# Patient Record
Sex: Male | Born: 1950 | Race: White | Hispanic: No | Marital: Married | Smoking: Former smoker
Health system: Southern US, Community
[De-identification: ages and names within clinical notes are randomized; demographics above are authoritative.]

## PROBLEM LIST (undated history)

## (undated) DIAGNOSIS — M4800 Spinal stenosis, site unspecified: Secondary | ICD-10-CM

## (undated) DIAGNOSIS — K219 Gastro-esophageal reflux disease without esophagitis: Secondary | ICD-10-CM

## (undated) DIAGNOSIS — E785 Hyperlipidemia, unspecified: Secondary | ICD-10-CM

## (undated) DIAGNOSIS — I480 Paroxysmal atrial fibrillation: Secondary | ICD-10-CM

## (undated) DIAGNOSIS — I428 Other cardiomyopathies: Secondary | ICD-10-CM

## (undated) DIAGNOSIS — G4700 Insomnia, unspecified: Secondary | ICD-10-CM

## (undated) DIAGNOSIS — M549 Dorsalgia, unspecified: Secondary | ICD-10-CM

## (undated) DIAGNOSIS — I499 Cardiac arrhythmia, unspecified: Secondary | ICD-10-CM

## (undated) DIAGNOSIS — M1990 Unspecified osteoarthritis, unspecified site: Secondary | ICD-10-CM

## (undated) DIAGNOSIS — I4892 Unspecified atrial flutter: Secondary | ICD-10-CM

## (undated) DIAGNOSIS — J189 Pneumonia, unspecified organism: Secondary | ICD-10-CM

## (undated) DIAGNOSIS — E669 Obesity, unspecified: Secondary | ICD-10-CM

## (undated) DIAGNOSIS — M797 Fibromyalgia: Secondary | ICD-10-CM

## (undated) DIAGNOSIS — I10 Essential (primary) hypertension: Secondary | ICD-10-CM

## (undated) HISTORY — DX: Insomnia, unspecified: G47.00

## (undated) HISTORY — DX: Other cardiomyopathies: I42.8

## (undated) HISTORY — DX: Paroxysmal atrial fibrillation: I48.0

## (undated) HISTORY — DX: Dorsalgia, unspecified: M54.9

## (undated) HISTORY — PX: COLONOSCOPY: SHX174

## (undated) HISTORY — DX: Obesity, unspecified: E66.9

## (undated) HISTORY — DX: Unspecified atrial flutter: I48.92

## (undated) HISTORY — DX: Essential (primary) hypertension: I10

## (undated) HISTORY — DX: Hyperlipidemia, unspecified: E78.5

## (undated) HISTORY — PX: WISDOM TOOTH EXTRACTION: SHX21

## (undated) HISTORY — PX: NO PAST SURGERIES: SHX2092

---

## 2001-10-19 ENCOUNTER — Encounter: Payer: Self-pay | Admitting: Family Medicine

## 2001-10-19 ENCOUNTER — Encounter: Admission: RE | Admit: 2001-10-19 | Discharge: 2001-10-19 | Payer: Self-pay | Admitting: Family Medicine

## 2003-02-18 ENCOUNTER — Inpatient Hospital Stay (HOSPITAL_COMMUNITY): Admission: EM | Admit: 2003-02-18 | Discharge: 2003-02-19 | Payer: Self-pay | Admitting: Emergency Medicine

## 2003-03-01 ENCOUNTER — Ambulatory Visit (HOSPITAL_COMMUNITY): Admission: RE | Admit: 2003-03-01 | Discharge: 2003-03-01 | Payer: Self-pay | Admitting: *Deleted

## 2003-04-23 ENCOUNTER — Ambulatory Visit (HOSPITAL_COMMUNITY): Admission: RE | Admit: 2003-04-23 | Discharge: 2003-04-23 | Payer: Self-pay | Admitting: *Deleted

## 2003-07-04 ENCOUNTER — Encounter (INDEPENDENT_AMBULATORY_CARE_PROVIDER_SITE_OTHER): Payer: Self-pay | Admitting: Cardiology

## 2003-07-04 ENCOUNTER — Ambulatory Visit (HOSPITAL_COMMUNITY): Admission: RE | Admit: 2003-07-04 | Discharge: 2003-07-04 | Payer: Self-pay | Admitting: Cardiology

## 2004-07-30 ENCOUNTER — Ambulatory Visit (HOSPITAL_COMMUNITY): Admission: RE | Admit: 2004-07-30 | Discharge: 2004-07-30 | Payer: Self-pay | Admitting: Ophthalmology

## 2004-08-06 ENCOUNTER — Ambulatory Visit (HOSPITAL_COMMUNITY): Admission: RE | Admit: 2004-08-06 | Discharge: 2004-08-06 | Payer: Self-pay | Admitting: Ophthalmology

## 2005-01-25 DIAGNOSIS — I4892 Unspecified atrial flutter: Secondary | ICD-10-CM

## 2005-01-25 HISTORY — DX: Unspecified atrial flutter: I48.92

## 2005-03-30 ENCOUNTER — Emergency Department (HOSPITAL_COMMUNITY): Admission: EM | Admit: 2005-03-30 | Discharge: 2005-03-30 | Payer: Self-pay | Admitting: Emergency Medicine

## 2005-05-31 ENCOUNTER — Ambulatory Visit: Payer: Self-pay | Admitting: Internal Medicine

## 2005-06-10 ENCOUNTER — Ambulatory Visit (HOSPITAL_COMMUNITY): Admission: RE | Admit: 2005-06-10 | Discharge: 2005-06-10 | Payer: Self-pay | Admitting: Internal Medicine

## 2005-06-10 ENCOUNTER — Ambulatory Visit: Payer: Self-pay | Admitting: Internal Medicine

## 2005-06-10 HISTORY — PX: OTHER SURGICAL HISTORY: SHX169

## 2009-07-31 ENCOUNTER — Ambulatory Visit (HOSPITAL_COMMUNITY): Admission: RE | Admit: 2009-07-31 | Discharge: 2009-07-31 | Payer: Self-pay | Admitting: Interventional Cardiology

## 2010-04-02 ENCOUNTER — Ambulatory Visit (HOSPITAL_COMMUNITY)
Admission: RE | Admit: 2010-04-02 | Discharge: 2010-04-02 | Disposition: A | Discharge status: Home / Self Care | Payer: PRIVATE HEALTH INSURANCE | Source: Ambulatory Visit | Attending: Interventional Cardiology | Admitting: Interventional Cardiology

## 2010-04-02 DIAGNOSIS — I4891 Unspecified atrial fibrillation: Secondary | ICD-10-CM | POA: Insufficient documentation

## 2010-04-12 LAB — PROTIME-INR
INR: 2.63 — ABNORMAL HIGH (ref 0.00–1.49)
Prothrombin Time: 27.9 seconds — ABNORMAL HIGH (ref 11.6–15.2)

## 2010-04-20 NOTE — Op Note (Signed)
  NAME:  Gary [name], Gary [name]                ACCOUNT NO.:  [id number]  MEDICAL RECORD NO.:  [id number]           PATIENT TYPE:  O  LOCATION:  [facility]                         FACILITY:  [facility]  PHYSICIAN:  Corky Crafts, MDDATE OF BIRTH:  October 09, 1950  DATE OF PROCEDURE:  04/02/2010 DATE OF DISCHARGE:                              OPERATIVE REPORT   PRIMARY CARE PHYSICIAN:  Dr. Cam Hai.  PROCEDURE PERFORMED:  DC cardioversion.  OPERATIOR:  Corky Crafts, MD  ANESTHESIA:  Dr. Sondra Come.  PROCEDURE NOTE:  Defibrillation pads were placed on the anterior chest wall and back.  The patient received 120 joules biphasic shock which was unsuccessful restoring normal sinus rhythm.  A second 150 joules biphasic shock was administered with successful restoration of normal sinus rhythm, 200 mg of IV Diprivan was used.  There are no apparent complications.  IMPRESSION:  Successful DC cardioversion.  RECOMMENDATIONS:  Continue flecainide 100 mg b.i.d. and Coumadin indefinitely.  I will see the patient back in a few weeks.     Corky Crafts, MD     JSV/MEDQ  D:  04/02/2010  T:  04/02/2010  Job:  [id number]  Electronically Signed by Lance Muss MD on 04/20/2010 08:11:45 AM

## 2010-06-12 NOTE — Op Note (Signed)
NAME:  Gary [name], Gary [name]                ACCOUNT NO.:  [id number]   MEDICAL RECORD NO.:  [id number]          PATIENT TYPE:  OIB   LOCATION:  3701                         FACILITY:  [facility]   PHYSICIAN:  Doylene Canning. Ladona Ridgel, M.D.  DATE OF BIRTH:  [birth year]   DATE OF PROCEDURE:  06/10/2005  DATE OF DISCHARGE:                                 OPERATIVE REPORT   PROCEDURE PERFORMED AT:  Electrophysiology study and catheter ablation of  atrial flutter.   INTRODUCTION:  The patient is a 60 year old male with a history of atrial  flutter and a rapid ventricular response on multiple medications who is  admitted to hospital for electrophysiologic study and catheter ablation of  his atrial flutter.   PROCEDURE:  After informed consent was obtained, the patient was taken to  the diagnostic EP lab in the fasting state.  After usual preparation and  draping, intravenous fentanyl and midazolam was given for sedation.  A 6-  [place] hexapolar catheter was inserted percutaneously into the right jugular  vein and advanced to the coronary sinus.  A 7-French, 20-pole halo catheter  was inserted percutaneously into the right femoral vein and advanced into  the right atrium.  A 5-French quadripolar catheter was inserted  percutaneously into the right femoral vein and advanced to the His bundle  region.  After measurement of the basic intervals, mapping was carried out,  demonstrating clockwise atrial flutter utilizing the tricuspid valve annulus  and the usual atrial flutter isthmus for reentrant activation of the atrium.  The 7-French quadripolar ablation catheter was then inserted into the right  femoral vein and advanced into the right atrium.  Mapping was carried out  with the ablation catheter.  Ten RF energy applications were subsequently  delivered to the usual atrial flutter isthmus.  During the eighth RF energy  application, atrial flutter was terminated and sinus rhythm was restored.  Mapping was  subsequently carried out, demonstrating intact atrial flutter  isthmus conduction.  Two additional RF energy applications were delivered  including one bonus RF capsule energy application, resulting in the creation  of atrial flutter isthmus block (bidirectional).  At this point, rapid  ventricular pacing was carried out from the RV apex, demonstrating VA  dissociation at 600 milliseconds.  Rapid atrial pacing was carried out from  the coronary sinus and the high right atrium and stepwise decreased down to  390 milliseconds where AV Wenckebach was observed.  During rapid atrial  pacing, the PR interval was less than the R interval, and there was no  inducible SVT.  Programmed atrial stimulation was carried out from the  coronary sinus and the high right heart atrium with a base drive cycle  length of 161 milliseconds.  The S1/S2 interval was stepwise decreased down  to 320 milliseconds where AV node ERP was observed.  During programmed  atrial stimulation, there no AH jumps and no echo beats and no inducible  SVT.  At this point, the catheters were removed, hemostasis was assured and  the patient was returned to his room in satisfactory condition.   COMPLICATIONS:  There  were no immediate procedure complications.   RESULTS:  1.  Baseline ECG:  The baseline ECG demonstrates atypical atrial flutter      with controlled ventricular response.  2.  Baseline intervals:  The atrial flutter cycle length was 235      milliseconds.  The HV interval was 40 milliseconds. The QRS duration was      130 milliseconds.  3.  Rapid ventricular pacing:  Rapid atrial pacing following ablation      demonstrated VA dissociation.  4.  Programmed ventricular stimulation:  Programmed ventricular stimulation      following catheter ablation demonstrated VA dissociation at 600      milliseconds.  5.  Rapid atrial pacing:  Rapid atrial pacing was carried out from the      coronary sinus and the high right atrium.  Pacing cycle length was 500      milliseconds and stepwise decreased down to 390 milliseconds where AV      Wenckebach was observed.  During rapid atrial pacing, the PR interval      was less than the R interval, and there was no inducible SVT.  6.  Programmed atrial stimulation:  Programmed atrial stimulation was      carried out from the coronary sinus and the high right atrium at a base      drive cycle length of 951 milliseconds.  The S1/S2 interval was stepwise      decreased down to 320 milliseconds where the AV node ERP was observed.      During programmed atrial stimulation, there are no AH jumps and no echo      beats noted.  7.  Arrhythmias observed:  Atrial flutter initiation present time of EP      study.  Duration was sustained. Cycle length 235 milliseconds. Method of      termination was with catheter ablation.  8.  Mapping:  Mapping of atrial flutter isthmus demonstrated the usual size      and orientation.  Electrogram morphology was much larger than usual,      however.  9.  RF energy application:  A total of 10 RF energy applications were      delivered.  During the eighth RF energy application, atrial flutter was      terminated and sinus rhythm restored.  During the ninth RF energy      application, atrial flutter isthmus block was demonstrated.  A 10th      bonus RF energy application was subsequently delivered.  The patient was      observed 25 minutes without recurrent atrial flutter isthmus conduction.   CONCLUSION:  This study demonstrates successful electrophysiologic study and  RF catheter ablation of typical clockwise atrial flutter with a total of 10  RF energy applications delivered in the usual atrial flutter isthmus  resulting in termination of atrial flutter, restoration of sinus rhythm and  creation of bidirectional block in the atrial flutter isthmus.           ______________________________ Doylene Canning. Ladona Ridgel, M.D.     GWT/MEDQ  D:  06/10/2005  T:   06/10/2005  Job:  [id number]   cc:   Meade Maw, M.D.  Fax: [phone]   Donia Guiles, M.D.  Fax: [phone]

## 2010-06-12 NOTE — Op Note (Signed)
NAME:  Gary [name], Gary [name]                          ACCOUNT NO.:  [id number]   MEDICAL RECORD NO.:  [id number]                   PATIENT TYPE:  OIB   LOCATION:  2899                                 FACILITY:  [facility]   PHYSICIAN:  Meade Maw, M.D.                 DATE OF BIRTH:  10-16-1950   DATE OF PROCEDURE:  04/23/2003  DATE OF DISCHARGE:                                 OPERATIVE REPORT   REFERRING PHYSICIAN:  Donia Guiles, M.D.   INDICATION FOR PROCEDURE:  Atrial flutter.   PROCEDURE:  The patient has been anticoagulated for four weeks.  His INR has  been more than 2.  He is brought to the short stay unit.  Anesthesia support  was obtained from Dr. Jean Rosenthal.  The patient underwent electrical  cardioversion with biphasic cardioverter at 150 joules.  He was successfully  converted back to sinus rhythm.  He will continue with Toprol at 100 mg  daily.  He will discontinue Cardizem.  He will continue with Coumadin to  maintain an INR of 2-3.  Should he have recurrent atrial flutter, will  consider further antiarrhythmics in the future.                                               Meade Maw, M.D.    HP/MEDQ  D:  04/23/2003  T:  04/23/2003  Job:  [id number]   cc:   Donia Guiles, M.D.  [address] [place]  [place] [address]  Fax: [phone]

## 2010-06-12 NOTE — Discharge Summary (Signed)
NAME:  Gary [name], Gary [name]                ACCOUNT NO.:  [id number]   MEDICAL RECORD NO.:  [id number]          PATIENT TYPE:  OIB   LOCATION:  3701                         FACILITY:  [facility]   PHYSICIAN:  Doylene Canning. Ladona Ridgel, M.D.  DATE OF BIRTH:  [birth year]   DATE OF ADMISSION:  06/10/2005  DATE OF DISCHARGE:  06/10/2005                                 DISCHARGE SUMMARY   He has an allergy to PENICILLIN.   PRINCIPAL DIAGNOSIS:  1.  Recurrent symptomatic atrial flutter with rapid ventricular rate.      1.  Rate controlled #1 Toprol XL 200 mg daily, #2 Cardizem 480 mg daily,          and newly added Digitek.  2.  Discharging status post atrial flutter ablation, this is a clockwise      atrial flutter, by Dr. Lewayne Bunting.  3.  Decreased exercise tolerance.   SECONDARY DIAGNOSES:  1.  Hypertension.  2.  History of left heart catheterization which showed normal coronaries,      but ejection fraction 35-40%.  3.  Dyslipidemia.  4.  Obesity.   PROCEDURE:  Jun 10, 2005:  Electrophysiology study, radiofrequency catheter  ablation of clockwise atrial flutter with bilateral isthmus block.   BRIEF HISTORY:  [name] is a 60 year old male.  He has a history of  hypertension, dyslipidemia, and developed an atrial flutter about two years  ago.  He had cardioversion.  He developed recurrence several months ago and  has been on gradually increasing doses of AV nodal blocking agents.  Now he  is on Cardizem 480 mg daily and Toprol 200 mg daily.  He is referred for  additional evaluation.   The patient says he has some generalized fatigue, but is usually able to get  by with daily activities.  However, with strenuous activity he gets fatigued  and short of breath quite easily.   Dr. Ladona Ridgel has seen the patient and recommends on the basis of  electrocardiogram and symptomatic findings electrophysiology study and  radiofrequency catheter ablation of his atrial flutter.  Patient understands  the risks  and benefits and wishes to proceed electively.   HOSPITAL COURSE:  Patient presented on May 17 to [facility].  He  underwent electrophysiology study with radiofrequency catheter ablation of a  clockwise atrial flutter.  There was no return of flutter waves and the  patient has maintained sinus rhythm in the post procedure period.  He will  discharge May 17 with the medications as follows.   DISCHARGE MEDICATIONS:  1.  He is to stop Cardizem and stop Digitek.  2.  He will continue taking Coumadin 5 mg daily.  3.  Welchol 240 mg daily.  4.  Elavil 50 mg at bedtime.  5.  Lipitor 10 mg daily at bedtime.  6.  Toprol XL 200 mg daily.  7.  Lexapro 10 mg daily.   He is discharging on a low sodium, low cholesterol diet.  He is asked not to  drive for the next two days.  He is not to lift anything heavier than  10  pounds for the next two weeks.  He will see Dr. Ladona Ridgel in follow-up  Thursday, July 01, 2005 at 12:30 p.m.   LABORATORIES:  Complete blood count:  White cells 7.8, hemoglobin 15.7,  hematocrit 45.1, platelets 171.  The INR was 2.  Sodium 142, potassium 4.3,  chloride 108, carbonate 28, glucose 77, BUN 13, creatinine 1.1.      Maple Mirza, P.A.    ______________________________  Doylene Canning. Ladona Ridgel, M.D.    GM/MEDQ  D:  06/10/2005  T:  06/11/2005  Job:  [id number]   cc:   Donia Guiles, M.D.  Fax: [phone]   Meade Maw, M.D.  Fax: [phone]

## 2010-06-12 NOTE — Cardiovascular Report (Signed)
NAME:  Gary [name], Gary [name]                          ACCOUNT NO.:  [id number]   MEDICAL RECORD NO.:  [id number]                   PATIENT TYPE:  OIB   LOCATION:  2899                                 FACILITY:  [facility]   PHYSICIAN:  Meade Maw, M.D.                 DATE OF BIRTH:  03-Jan-1951   DATE OF PROCEDURE:  DATE OF DISCHARGE:  03/01/2003                              CARDIAC CATHETERIZATION   REFERRING PHYSICIAN:  Donia Guiles, M.D.   PROCEDURES PERFORMED:   CARDIOLOGIST:  Meade Maw, M.D.   INDICATIONS FOR PROCEDURE:  Cardiomyopathy.  Ejection fraction 35% by echo.   DESCRIPTION OF PROCEDURE:  After obtaining written informed consent the  patient was brought to the cardiac catheterization lab in the post  absorptive state.  Preop sedation was achieved using IV Versed.  The right  groin was prepped and draped in the usual sterile fashion.  Local anesthesia  was achieved using 1% Xylocaine.  A 6 French hemostasis sheath was placed  into the right femoral artery using the modified Seldinger technique.  Selective coronary angiography was performed using JL-4 and JR-4 Judkins  catheters.  Multiple views were obtained.  All catheter exchanges were made  over a guidewire.   Single plane ventriculogram was performed in the RAO position.  There was no  identifiable disease.   The patient was transferred to the hold area.  The hemostasis sheath was  removed.  Hemostasis was achieved using the FemoStop device.   COMPLICATIONS:  There were no immediate complications.   FINDINGS:   HEMODYNAMIC DATA:  The aortic pressure was 101/71.  LV pressure was 108/11.  The EDP was 14.   VENTRICULOGRAPHIC DATA:  Single plane ventriculogram revealed moderate  hypokinesis.  Ejection fraction 35-40%.  There was mild mitral regurgitation  noted.   ANGIOGRAPHIC DATA:  Coronary Angiography  Left Main Coronary Artery:  The left main coronary artery trifurcates into  the left anterior descending,  large ramus and circumflex vessel.  There is  no disease in the left main or its branches.   Left Anterior Descending:  The left anterior descending gives rise to a  large bifurcating D-1 and goes on to end as an apical branch.  There was no  disease noted in the left anterior descending or its branches.   Ramus Vessel:  The ramus vessel is a large vessel ending at the apex.  It  also bifurcates.  There is no disease noted in the ramus vessel.   Circumflex Vessel:  The circumflex vessel gives rise to a large trifurcating  OM-1.  There is no disease noted in the circumflex or its branches.   Right Coronary Artery:  The right coronary artery is a moderate-sized  vessel.  It gives rise to three RV marginals, a PDA and a large  posterolateral branch.  There no disease noted in the right coronary artery  or its branches.  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  Cardiomyopathy most likely related to atrial flutter with right ventricular  response.   PLAN:  1. We will continue with Cardizem and Toprol for rate control.  2. We will initiate Coumadin.  3. Following four weeks of anticoagulation electrical cardioversion will be     undertaken.  4. Should the patient develop recurrent atrial flutter we will need to     consider further antiarrhythmic therapy.                                               Meade Maw, M.D.    HP/MEDQ  D:  03/01/2003  T:  03/02/2003  Job:  [id number]

## 2010-06-26 ENCOUNTER — Ambulatory Visit (HOSPITAL_COMMUNITY)
Admission: RE | Admit: 2010-06-26 | Discharge: 2010-06-26 | Disposition: A | Discharge status: Home / Self Care | Payer: PRIVATE HEALTH INSURANCE | Source: Ambulatory Visit | Attending: Chiropractic Medicine | Admitting: Chiropractic Medicine

## 2010-06-26 ENCOUNTER — Other Ambulatory Visit (HOSPITAL_COMMUNITY): Payer: Self-pay | Admitting: Chiropractic Medicine

## 2010-06-26 DIAGNOSIS — R52 Pain, unspecified: Secondary | ICD-10-CM

## 2010-06-26 DIAGNOSIS — M5030 Other cervical disc degeneration, unspecified cervical region: Secondary | ICD-10-CM | POA: Insufficient documentation

## 2010-06-26 DIAGNOSIS — M542 Cervicalgia: Secondary | ICD-10-CM | POA: Insufficient documentation

## 2010-08-08 ENCOUNTER — Inpatient Hospital Stay (HOSPITAL_COMMUNITY)
Admission: EM | Admit: 2010-08-08 | Discharge: 2010-08-09 | DRG: 313 | Disposition: A | Discharge status: Home / Self Care | Payer: PRIVATE HEALTH INSURANCE | Source: Ambulatory Visit | Attending: Interventional Cardiology | Admitting: Interventional Cardiology

## 2010-08-08 ENCOUNTER — Emergency Department (HOSPITAL_COMMUNITY): Payer: PRIVATE HEALTH INSURANCE

## 2010-08-08 ENCOUNTER — Emergency Department (HOSPITAL_COMMUNITY)
Admission: EM | Admit: 2010-08-08 | Discharge: 2010-08-08 | Disposition: A | Discharge status: Other Acute Inpatient Hospital | Payer: PRIVATE HEALTH INSURANCE | Source: Home / Self Care | Attending: Emergency Medicine | Admitting: Emergency Medicine

## 2010-08-08 DIAGNOSIS — R0789 Other chest pain: Principal | ICD-10-CM | POA: Diagnosis present

## 2010-08-08 DIAGNOSIS — I4892 Unspecified atrial flutter: Secondary | ICD-10-CM | POA: Diagnosis present

## 2010-08-08 DIAGNOSIS — E7800 Pure hypercholesterolemia, unspecified: Secondary | ICD-10-CM | POA: Insufficient documentation

## 2010-08-08 DIAGNOSIS — R0609 Other forms of dyspnea: Secondary | ICD-10-CM | POA: Insufficient documentation

## 2010-08-08 DIAGNOSIS — R0989 Other specified symptoms and signs involving the circulatory and respiratory systems: Secondary | ICD-10-CM | POA: Insufficient documentation

## 2010-08-08 DIAGNOSIS — I4891 Unspecified atrial fibrillation: Secondary | ICD-10-CM | POA: Diagnosis present

## 2010-08-08 DIAGNOSIS — I200 Unstable angina: Secondary | ICD-10-CM | POA: Insufficient documentation

## 2010-08-08 DIAGNOSIS — E669 Obesity, unspecified: Secondary | ICD-10-CM | POA: Diagnosis present

## 2010-08-08 DIAGNOSIS — Z7982 Long term (current) use of aspirin: Secondary | ICD-10-CM

## 2010-08-08 DIAGNOSIS — R079 Chest pain, unspecified: Secondary | ICD-10-CM

## 2010-08-08 LAB — BASIC METABOLIC PANEL
CO2: 27 mEq/L (ref 19–32)
Calcium: 9.5 mg/dL (ref 8.4–10.5)
Chloride: 92 mEq/L — ABNORMAL LOW (ref 96–112)
Glucose, Bld: 108 mg/dL — ABNORMAL HIGH (ref 70–99)
Sodium: 131 mEq/L — ABNORMAL LOW (ref 135–145)

## 2010-08-08 LAB — DIFFERENTIAL
Basophils Relative: 0 % (ref 0–1)
Eosinophils Absolute: 0.1 10*3/uL (ref 0.0–0.7)
Eosinophils Relative: 2 % (ref 0–5)
Monocytes Relative: 10 % (ref 3–12)
Neutrophils Relative %: 54 % (ref 43–77)

## 2010-08-08 LAB — CBC
MCH: 32.4 pg (ref 26.0–34.0)
Platelets: 141 10*3/uL — ABNORMAL LOW (ref 150–400)
RBC: 5.06 MIL/uL (ref 4.22–5.81)
RDW: 14 % (ref 11.5–15.5)
WBC: 7.2 10*3/uL (ref 4.0–10.5)

## 2010-08-08 LAB — TROPONIN I: Troponin I: <0.3 ng/mL (ref <0.30)

## 2010-08-08 LAB — APTT: aPTT: 29 seconds (ref 24–37)

## 2010-08-08 LAB — CK TOTAL AND CKMB (NOT AT ARMC): Total CK: 436 U/L — ABNORMAL HIGH (ref 7–232)

## 2010-08-09 LAB — CARDIAC PANEL(CRET KIN+CKTOT+MB+TROPI)
CK, MB: 4.3 ng/mL — ABNORMAL HIGH (ref 0.3–4.0)
CK, MB: 4.8 ng/mL — ABNORMAL HIGH (ref 0.3–4.0)
Relative Index: 1.4 (ref 0.0–2.5)
Relative Index: 1.5 (ref 0.0–2.5)
Total CK: 355 U/L — ABNORMAL HIGH (ref 7–232)
Total CK: 306 U/L — ABNORMAL HIGH (ref 7–232)
Troponin I: <0.3 ng/mL (ref <0.30)
Troponin I: <0.3 ng/mL (ref <0.30)

## 2010-08-09 LAB — CBC
Hemoglobin: 15.6 g/dL (ref 13.0–17.0)
MCH: 33.8 pg (ref 26.0–34.0)
MCHC: 36.2 g/dL — ABNORMAL HIGH (ref 30.0–36.0)
RDW: 14.1 % (ref 11.5–15.5)

## 2010-08-09 LAB — COMPREHENSIVE METABOLIC PANEL
ALT: 52 U/L (ref 0–53)
AST: 39 U/L — ABNORMAL HIGH (ref 0–37)
Albumin: 3.6 g/dL (ref 3.5–5.2)
Alkaline Phosphatase: 83 U/L (ref 39–117)
CO2: 28 mEq/L (ref 19–32)
Chloride: 98 mEq/L (ref 96–112)
GFR calc non Af Amer: >60 mL/min (ref >60)
Potassium: 3.3 mEq/L — ABNORMAL LOW (ref 3.5–5.1)
Sodium: 136 mEq/L (ref 135–145)
Total Bilirubin: 0.3 mg/dL (ref 0.3–1.2)

## 2010-08-09 LAB — LIPID PANEL
LDL Cholesterol: 83 mg/dL (ref 0–99)
Triglycerides: 234 mg/dL — ABNORMAL HIGH (ref <150)
VLDL: 47 mg/dL — ABNORMAL HIGH (ref 0–40)

## 2010-08-09 LAB — HEMOGLOBIN A1C
Hgb A1c MFr Bld: 5.6 % (ref <5.7)
Mean Plasma Glucose: 114 mg/dL (ref <117)

## 2010-08-09 LAB — PROTIME-INR
INR: 0.95 (ref 0.00–1.49)
Prothrombin Time: 12.9 seconds (ref 11.6–15.2)

## 2010-08-19 NOTE — H&P (Signed)
NAMEMarland [name]  HUTCH, RHETT NO.:  [id number]  MEDICAL RECORD NO.:  [id number]  LOCATION:  3714                         FACILITY:  [facility]  PHYSICIAN:  Harlon Flor, MD   DATE OF BIRTH:  May 01, 1950  DATE OF ADMISSION:  08/08/2010 DATE OF DISCHARGE:                             HISTORY & PHYSICAL   CHIEF COMPLAINT:  Chest pain.  HISTORY OF PRESENT ILLNESS:  Gary [name] is a 60 year old white male with a history of atrial arrhythmias who is admitted to the hospital and transferred from [facility] due to chest pain.  He developed chest discomfort earlier tonight soon after dinner.  He went on a walk which did not relieve his chest pain, however, did not make it any worse.  His pain eventually spontaneously resolved.  It was not associated with exertion, diaphoresis, or nausea.  He did receive some nitroglycerin without any improvement in his pain.  He now states that he is pain- free.  He has a history of atrial flutter.  He reports that he has had an ablation in the past as well as multiple cardioversions.  He has not had any recent palpitations.  He did have a left heart catheterization in 2005 that did not show any obstructive disease.  PAST MEDICAL HISTORY: 1. Atrial flutter:  I do not have access to his records but he     previously had apparently an atrial flutter ablation and has had     multiple cardioversions.  Currently, he is on flecainide. 2. Nonischemic cardiomyopathy:  At the time of his left heart     catheterization in February 2005, his LVEF of 35-40% and he had no     obstructive coronary artery disease.  Hs LV dysfunction was felt to     be due to A flutter with rapid ventricular response at that time.     His repeat echo is not available currently. 3. Hyperlipidemia. 4. Significant alcohol use.  SOCIAL HISTORY:  He drinks 4-5 glasses of wine per night.  He has not used tobacco.  He lives at home with his wife and he is retired.  FAMILY  HISTORY:  There is no history of early coronary artery disease.  HOME MEDICATIONS: 1. WelChol 625 two tabs b.i.d. 2. Vitamin E 400 units daily. 3. Niaspan 1000 mg at bedtime. 4. Multivitamin 1 daily. 5. Toprol-XL 100 mg daily. 6. Melatonin as needed at bedtime. 7. Lipitor 40 mg half a tablet daily. 8. Hydrochlorothiazide 25 mg daily. 9. Flecainide 100 mg b.i.d. 10.Fish oil 1200 mg b.i.d. 11.Diltiazem 180 mg extend release b.i.d. 12.Aspirin 81 mg daily. 13.Amitriptyline 50 mg daily.  ALLERGIES:  PENICILLINS.  REVIEW OF SYSTEMS:  Full review of systems is obtained and is negative except as described in the HPI.  PHYSICAL EXAMINATION:  VITAL SIGNS:  Blood pressure 163/82, pulse 72, respirations 18, temperature 98.8, and O2 saturation 95% on room air. GENERAL:  No acute distress. HEENT:  Extraocular movements intact.  Oropharynx benign.  Nonicteric sclera. NECK:  Supple. CARDIOVASCULAR:  Regular rate and rhythm.  No murmurs, rubs, or gallops. LUNGS:  Clear to auscultation bilaterally. ABDOMEN:  Soft, nontender, and nondistended. EXTREMITIES:  There is no clubbing, cyanosis, or edema. LYMPHATIC:  No lymphadenopathy. SKIN:  No rashes. NEUROLOGIC:  He is alert and oriented x3 and moves all extremities well. Cranial nerves are grossly intact.  LABORATORY DATA:  White count 7.2, hemoglobin 16, and platelets 141. BUN 17 and creatinine 0.96.  Troponin is undetectable.  His EKG shows normal sinus rhythm with first-degree AV block and no ST-T changes.  His chest x-ray is clear.  ASSESSMENT AND PLAN:  Mr. Gary [name] is a 60 year old white male with history of atrial arrhythmias and nonischemic cardiomyopathy in the setting of atrial fibrillation with rapid ventricular response and no coronary artery disease by left heart cath in 2005 who presents with atypical chest pain and negative cardiac enzymes x1.  He reports that he had a negative stress test in March. 1. Chest pain:  We will  rule out myocardial infarction with serial     cardiac markers.  We will treat him with aspirin 325 daily as well     as Lovenox 1 mg/kg b.i.d.  We will also give him a GI cocktail to     see if this alleviate any of his symptoms.  His stress test will     need to be reviewed and may consider left heart catheterization to     rule out obstructive coronary artery disease given his recent     negative stress. 2. Atrial flutter:  He is currently in sinus rhythm.  We will continue     his flecainide for now as well as his AV nodal blockade agents.     Harlon Flor, MD     MMB/MEDQ  D:  08/08/2010  T:  08/09/2010  Job:  [id number]  cc:   Corky Crafts, MD  Electronically Signed by Meridee Score MD on 08/19/2010 08:16:09 PM

## 2010-09-03 NOTE — Discharge Summary (Signed)
  NAMEMarland Kitchen  Gary [name], Gary [name] NO.:  [id number]  MEDICAL RECORD NO.:  [id number]  LOCATION:  3714                         FACILITY:  [facility]  PHYSICIAN:  Corky Crafts, MDDATE OF BIRTH:  January 31, 1950  DATE OF ADMISSION:  08/08/2010 DATE OF DISCHARGE:  08/09/2010                              DISCHARGE SUMMARY   FINAL DIAGNOSES: 1. Atrial fibrillation. 2. Atypical chest pain. 3. Hypertension. 4. Hyperlipidemia.  HOSPITAL COURSE:  The patient had an episode of chest pressure.  It did not resolve with walking, so he came in to the hospital for further evaluation.  It resolved shortly after coming to the hospital.  He has had a fairly recent normal stress test, but he wanted to be careful.  He ruled out for MI.  Given that he felt much better, no diagnostic testing was done other than blood work.  EKG showed no significant abnormality. Chest x-ray also showed no active disease.  DISCHARGE MEDICATIONS: 1. Amitriptyline 50 mg at bedtime. 2. Aspirin 81 mg daily. 3. Diltiazem 180 mg daily. 4. Fish oil 1200 mg b.i.d. 5. Flecainide 100 mg p.o. b.i.d. 6. Hydrochlorothiazide 25 mg daily. 7. Lipitor 20 mg daily. 8. Melatonin. 9. Metoprolol XL 100 mg p.o. daily. 10.Multivitamin 1 daily. 11.Niaspan 1 g daily. 12.Vitamin E 400 units daily. 13.WelChol 625 mg tablets, 2 tablets b.i.d.  FOLLOWUP APPOINTMENTS:  With Corky Crafts, MD in 1-2 weeks. Consider repeat treadmill test at that time.  ACTIVITY:  Increase activity slowly.  DIET:  Low-sodium, heart-healthy diet.  INSTRUCTIONS:  If he has recurrent symptoms, he should come back to the emergency room.     Corky Crafts, MD     JSV/MEDQ  D:  08/28/2010  T:  08/29/2010  Job:  [id number]  Electronically Signed by Lance Muss MD on 09/03/2010 12:27:31 PM

## 2011-04-20 ENCOUNTER — Other Ambulatory Visit: Payer: Self-pay | Admitting: Family Medicine

## 2011-04-20 ENCOUNTER — Ambulatory Visit
Admission: RE | Admit: 2011-04-20 | Discharge: 2011-04-20 | Disposition: A | Discharge status: Home / Self Care | Payer: PRIVATE HEALTH INSURANCE | Source: Ambulatory Visit | Attending: Family Medicine | Admitting: Family Medicine

## 2011-04-20 DIAGNOSIS — M545 Low back pain, unspecified: Secondary | ICD-10-CM

## 2011-04-22 ENCOUNTER — Other Ambulatory Visit: Payer: Self-pay | Admitting: Sports Medicine

## 2011-04-22 DIAGNOSIS — M545 Low back pain, unspecified: Secondary | ICD-10-CM

## 2011-04-24 ENCOUNTER — Ambulatory Visit
Admission: RE | Admit: 2011-04-24 | Discharge: 2011-04-24 | Disposition: A | Discharge status: Home / Self Care | Payer: PRIVATE HEALTH INSURANCE | Source: Ambulatory Visit | Attending: Sports Medicine | Admitting: Sports Medicine

## 2011-04-24 DIAGNOSIS — M545 Low back pain, unspecified: Secondary | ICD-10-CM

## 2013-01-01 ENCOUNTER — Ambulatory Visit: Payer: PRIVATE HEALTH INSURANCE | Admitting: Interventional Cardiology

## 2013-01-06 ENCOUNTER — Encounter: Payer: Self-pay | Admitting: *Deleted

## 2013-01-06 ENCOUNTER — Encounter: Payer: Self-pay | Admitting: Interventional Cardiology

## 2013-01-06 DIAGNOSIS — E785 Hyperlipidemia, unspecified: Secondary | ICD-10-CM | POA: Insufficient documentation

## 2013-01-06 DIAGNOSIS — I4891 Unspecified atrial fibrillation: Secondary | ICD-10-CM | POA: Insufficient documentation

## 2013-01-06 DIAGNOSIS — I4892 Unspecified atrial flutter: Secondary | ICD-10-CM | POA: Insufficient documentation

## 2013-01-06 DIAGNOSIS — M549 Dorsalgia, unspecified: Secondary | ICD-10-CM | POA: Insufficient documentation

## 2013-01-06 DIAGNOSIS — I429 Cardiomyopathy, unspecified: Secondary | ICD-10-CM | POA: Insufficient documentation

## 2013-01-06 DIAGNOSIS — I519 Heart disease, unspecified: Secondary | ICD-10-CM | POA: Insufficient documentation

## 2013-01-06 DIAGNOSIS — G4700 Insomnia, unspecified: Secondary | ICD-10-CM | POA: Insufficient documentation

## 2013-01-06 DIAGNOSIS — F5104 Psychophysiologic insomnia: Secondary | ICD-10-CM | POA: Insufficient documentation

## 2013-01-06 DIAGNOSIS — E669 Obesity, unspecified: Secondary | ICD-10-CM | POA: Insufficient documentation

## 2013-01-08 ENCOUNTER — Ambulatory Visit (INDEPENDENT_AMBULATORY_CARE_PROVIDER_SITE_OTHER): Payer: BC Managed Care – PPO | Admitting: Interventional Cardiology

## 2013-01-08 ENCOUNTER — Encounter: Payer: Self-pay | Admitting: Interventional Cardiology

## 2013-01-08 VITALS — BP 125/75 | HR 74 | Ht 73.0 in | Wt 228.1 lb

## 2013-01-08 DIAGNOSIS — I4891 Unspecified atrial fibrillation: Secondary | ICD-10-CM

## 2013-01-08 DIAGNOSIS — E669 Obesity, unspecified: Secondary | ICD-10-CM

## 2013-01-08 NOTE — Progress Notes (Signed)
Patient ID: Gary [name], male   DOB: [birth year], 62 y.o.   MRN: [id number]    [address] [place], [place]  [address] Phone: [phone] Fax:  [phone]  Date:  01/08/2013   ID:  Gary [name], DOB 05-31-1950, MRN [id number]  PCP:  Lupita Raider, MD      History of Present Illness: Gary [name] is a 62 y.o. male y/o who has had AFib. He had an episode of chest pain that got him to the hospital in 2012. he had a normal treadmill test. He has done well since that time. He has cut back on his caloric intake and he has decreased alcohol intake. He exercises more now.  He is using an exercise bike a lot.  He stretches a lot as well.  He is stretching more.  Atrial Fibrillation F/U:  Denies : Chest pain.  Dizziness.  Leg edema.  Orthopnea.  Palpitations.  Shortness of breath.  Syncope.     Wt Readings from Last 3 Encounters:  01/08/13 228 lb 1.9 oz (103.475 kg)     Past Medical History  Diagnosis Date  . Obesity   . Hyperlipidemia   . A-fib   . Atrial flutter   . Insomnia   . Heart disease   . Back pain   . Cardiomyopathy     Current Outpatient Prescriptions  Medication Sig Dispense Refill  . amitriptyline (ELAVIL) 50 MG tablet Take 50 mg by mouth at bedtime.      [name] aspirin 81 MG tablet Take 81 mg by mouth daily.      [name] atorvastatin (LIPITOR) 10 MG tablet Take 10 mg by mouth daily.      . colchicine 0.6 MG tablet Take 0.6 mg by mouth daily.      . cyclobenzaprine (FLEXERIL) 10 MG tablet 10 mg as needed. 1/2 - 1 tab PRN      . diltiazem (CARDIZEM CD) 180 MG 24 hr capsule Take 180 mg by mouth daily.      . flecainide (TAMBOCOR) 100 MG tablet 100 mg 2 (two) times daily.      . metoprolol succinate (TOPROL-XL) 25 MG 24 hr tablet Take 25 mg by mouth daily.      . Multiple Vitamin (MULTIVITAMIN) capsule Take 1 capsule by mouth daily.      . Omega-3 Fatty Acids (FISH OIL PO) Take 1 tablet by mouth daily.      . valACYclovir (VALTREX) 1000 MG tablet as  needed. As directed      . VITAMIN E PO Take 1 tablet by mouth daily.       No current facility-administered medications for this visit.    Allergies:    Allergies  Allergen Reactions  . Penicillins     rash    Social History:  The patient  reports that he has quit smoking. He does not have any smokeless tobacco history on file.   Family History:  The patient's family history includes Heart attack in his father; Hypertension in his father and mother.   ROS:  Please see the history of present illness.  No nausea, vomiting.  No fevers, chills.  No focal weakness.  No dysuria.    All other systems reviewed and negative.   PHYSICAL EXAM: VS:  BP 125/75  Pulse 74  Ht 6\' 1"  (1.854 m)  Wt 228 lb 1.9 oz (103.475 kg)  BMI 30.10 kg/m2 Well nourished, well developed, in no acute distress HEENT: normal  Neck: no JVD, no carotid bruits Cardiac:  normal S1, S2; RRR;  Lungs:  clear to auscultation bilaterally, no wheezing, rhonchi or rales Abd: soft, nontender, no hepatomegaly Ext: no edema Skin: warm and dry Neuro:   no focal abnormalities noted     ASSESSMENT AND PLAN:  Atrial fibrillation  Continue Toprol XL Tablet Extended Release 24 Hour, 25 MG, 1 tablet, Orally, Once a day, 90, Refills 3 Continue Flecainide Acetate Tablet, 100 MG, 1 tablet, Orally, twice a day IMAGING: EKG    Harward,Amy 06/29/2012 09:46:22 AM > Ola Raap,JAY 06/29/2012 09:59:52 AM > NSR, no ST segment changes   Notes: Maintaining sinus rhythm.    Preventive Medicine  Adult topics discussed:  Diet: weight loss, healthy diet.  Exercise: 5 days a week, at least 30 minutes of aerobic exercise.       Signed, Fredric Mare, MD, [facility] 01/08/2013 1:26 PM

## 2013-01-08 NOTE — Patient Instructions (Signed)
Your physician wants you to follow-up in: 6 months with Dr. Varanasi. You will receive a reminder letter in the mail two months in advance. If you don't receive a letter, please call our office to schedule the follow-up appointment.  Your physician recommends that you continue on your current medications as directed. Please refer to the Current Medication list given to you today.  

## 2013-01-13 ENCOUNTER — Other Ambulatory Visit: Payer: Self-pay | Admitting: Interventional Cardiology

## 2013-05-30 ENCOUNTER — Other Ambulatory Visit: Payer: Self-pay | Admitting: *Deleted

## 2013-05-30 MED ORDER — FLECAINIDE ACETATE 100 MG PO TABS: ORAL_TABLET | ORAL | Status: DC

## 2013-06-22 ENCOUNTER — Other Ambulatory Visit: Payer: Self-pay

## 2013-06-22 MED ORDER — DILTIAZEM HCL ER COATED BEADS 180 MG PO CP24: ORAL_CAPSULE | ORAL | Status: DC

## 2013-07-20 ENCOUNTER — Other Ambulatory Visit: Payer: Self-pay

## 2013-07-20 MED ORDER — FLECAINIDE ACETATE 100 MG PO TABS: ORAL_TABLET | ORAL | Status: DC

## 2013-07-30 ENCOUNTER — Other Ambulatory Visit: Payer: Self-pay | Admitting: Interventional Cardiology

## 2013-12-17 ENCOUNTER — Other Ambulatory Visit: Payer: Self-pay | Admitting: *Deleted

## 2013-12-17 MED ORDER — DILTIAZEM HCL ER COATED BEADS 180 MG PO CP24: ORAL_CAPSULE | ORAL | Status: DC

## 2014-01-11 ENCOUNTER — Other Ambulatory Visit: Payer: Self-pay | Admitting: *Deleted

## 2014-01-11 MED ORDER — DILTIAZEM HCL ER COATED BEADS 180 MG PO CP24: ORAL_CAPSULE | ORAL | Status: DC

## 2014-01-30 ENCOUNTER — Other Ambulatory Visit: Payer: Self-pay

## 2014-01-30 MED ORDER — FLECAINIDE ACETATE 100 MG PO TABS: ORAL_TABLET | ORAL | Status: DC

## 2014-02-04 ENCOUNTER — Encounter: Payer: Self-pay | Admitting: Interventional Cardiology

## 2014-02-04 ENCOUNTER — Ambulatory Visit (INDEPENDENT_AMBULATORY_CARE_PROVIDER_SITE_OTHER): Payer: BLUE CROSS/BLUE SHIELD | Admitting: Interventional Cardiology

## 2014-02-04 VITALS — BP 122/82 | HR 67 | Ht 73.0 in | Wt 221.0 lb

## 2014-02-04 DIAGNOSIS — I4891 Unspecified atrial fibrillation: Secondary | ICD-10-CM

## 2014-02-04 NOTE — Progress Notes (Signed)
Patient ID: Gary [name], male   DOB: 1950-09-16, 64 y.o.   MRN: [id number] Patient ID: Gary [name], male   DOB: Apr 11, 1950, 64 y.o.   MRN: [id number]    [name] [place], [place]  [address] Phone: [phone] Fax:  [phone]  Date:  02/04/2014   ID:  Gary [name], DOB [birth year], MRN [id number]  PCP:  Gary Neer, MD      History of Present Illness: Gary [name] is a 64 y.o. male y/o who has had AFib. He had an episode of chest pain that got him to the hospital in 2012. he had a normal treadmill test. He has done well since that time. He has cut back on his caloric intake and he has decreased alcohol intake. He exercises more now.  He is using an exercise bike a lot.  He stretches a lot as well.  He is stretching more.  Atrial Fibrillation F/U:  Denies : Chest pain.  Dizziness.  Leg edema.  Orthopnea.  Palpitations.  Shortness of breath.  Syncope.   BP reading was elevated yesterday.  For the most part, BP has been in the 557D systolic range.  Wt Readings from Last 3 Encounters:  02/04/14 221 lb (100.245 kg)  01/08/13 228 lb 1.9 oz (103.475 kg)     Past Medical History  Diagnosis Date  . Obesity   . Hyperlipidemia   . A-fib   . Atrial flutter   . Insomnia   . Heart disease   . Back pain   . Cardiomyopathy     Current Outpatient Prescriptions  Medication Sig Dispense Refill  . amitriptyline (ELAVIL) 50 MG tablet Take 50 mg by mouth at bedtime.    [name] aspirin 81 MG tablet Take 81 mg by mouth daily.    [name] atorvastatin (LIPITOR) 10 MG tablet Take 10 mg by mouth daily.    . colchicine 0.6 MG tablet Take 0.6 mg by mouth daily.    [name] diltiazem (CARDIZEM CD) 180 MG 24 hr capsule TAKE 1 CAPSULE BY MOUTH TWICE DAILY 60 capsule 5  . flecainide (TAMBOCOR) 100 MG tablet TAKE 1 TABLET BY MOUTH TWICE DAILY 60 tablet 6  . metoprolol succinate (TOPROL-XL) 25 MG 24 hr tablet take 1 tablet by mouth once daily 90 tablet 3  . Multiple Vitamin (MULTIVITAMIN)  capsule Take 1 capsule by mouth daily.    . Omega-3 Fatty Acids (FISH OIL PO) Take 1 tablet by mouth daily.    [name] VITAMIN E PO Take 1 tablet by mouth daily.    . cyclobenzaprine (FLEXERIL) 10 MG tablet 10 mg as needed. 1/2 - 1 tab PRN    . valACYclovir (VALTREX) 1000 MG tablet as needed. As directed     No current facility-administered medications for this visit.    Allergies:    Allergies  Allergen Reactions  . Penicillins     rash    Social History:  The patient  reports that he has quit smoking. He does not have any smokeless tobacco history on file.   Family History:  The patient's family history includes Heart attack in his father; Hypertension in his father and mother.   ROS:  Please see the history of present illness.  No nausea, vomiting.  No fevers, chills.  No focal weakness.  No dysuria.    All other systems reviewed and negative.   PHYSICAL EXAM: VS:  BP 122/82 mmHg  Pulse 67  Ht 6\' 1"  (1.854 m)  Wt 221 lb (100.245 kg)  BMI 29.16 kg/m2 Well nourished, well developed, in no acute distress HEENT: normal Neck: no JVD, no carotid bruits Cardiac:  normal S1, S2; RRR;  Lungs:  clear to auscultation bilaterally, no wheezing, rhonchi or rales Abd: soft, nontender, no hepatomegaly Ext: no edema Skin: warm and dry Neuro:   no focal abnormalities noted Psych: normal affect     ASSESSMENT AND PLAN:  Atrial fibrillation  Continue Toprol XL Tablet Extended Release 24 Hour, 25 MG, 1 tablet, Orally, Once a day, 90, Refills 3 Continue Flecainide Acetate Tablet, 100 MG, 1 tablet, Orally, twice a day       Notes: Maintaining sinus rhythm.    Preventive Medicine  Adult topics discussed:  Diet: weight loss, healthy diet.  Exercise: 5 days a week, at least 30 minutes of aerobic exercise.       Signed, Gary Marble, MD, [facility] 02/04/2014 11:53 AM

## 2014-02-04 NOTE — Patient Instructions (Signed)
Your physician recommends that you continue on your current medications as directed. Please refer to the Current Medication list given to you today.  Your physician wants you to follow-up in: 1 year with Dr. Varanasi. You will receive a reminder letter in the mail two months in advance. If you don't receive a letter, please call our office to schedule the follow-up appointment.  

## 2014-02-07 ENCOUNTER — Encounter: Payer: Self-pay | Admitting: Interventional Cardiology

## 2014-02-11 ENCOUNTER — Other Ambulatory Visit: Payer: Self-pay | Admitting: Family Medicine

## 2014-02-11 DIAGNOSIS — Z8249 Family history of ischemic heart disease and other diseases of the circulatory system: Secondary | ICD-10-CM

## 2014-02-20 ENCOUNTER — Ambulatory Visit
Admission: RE | Admit: 2014-02-20 | Discharge: 2014-02-20 | Disposition: A | Discharge status: Home / Self Care | Payer: BLUE CROSS/BLUE SHIELD | Source: Ambulatory Visit | Attending: Family Medicine | Admitting: Family Medicine

## 2014-02-20 DIAGNOSIS — Z8249 Family history of ischemic heart disease and other diseases of the circulatory system: Secondary | ICD-10-CM

## 2014-06-26 ENCOUNTER — Other Ambulatory Visit: Payer: Self-pay

## 2014-06-26 MED ORDER — DILTIAZEM HCL ER COATED BEADS 180 MG PO CP24: ORAL_CAPSULE | ORAL | Status: DC

## 2014-07-26 ENCOUNTER — Other Ambulatory Visit: Payer: Self-pay

## 2014-07-26 MED ORDER — METOPROLOL SUCCINATE ER 25 MG PO TB24: 25.0000 mg | ORAL_TABLET | Freq: Every day | ORAL | Status: DC

## 2014-07-26 NOTE — Telephone Encounter (Signed)
Per note 1.11.16

## 2014-08-07 DIAGNOSIS — H2513 Age-related nuclear cataract, bilateral: Secondary | ICD-10-CM | POA: Diagnosis not present

## 2014-08-16 ENCOUNTER — Other Ambulatory Visit: Payer: Self-pay | Admitting: Interventional Cardiology

## 2014-08-16 MED ORDER — FLECAINIDE ACETATE 100 MG PO TABS: ORAL_TABLET | ORAL | Status: DC

## 2014-09-13 DIAGNOSIS — S0502XA Injury of conjunctiva and corneal abrasion without foreign body, left eye, initial encounter: Secondary | ICD-10-CM | POA: Diagnosis not present

## 2014-09-16 DIAGNOSIS — S0502XD Injury of conjunctiva and corneal abrasion without foreign body, left eye, subsequent encounter: Secondary | ICD-10-CM | POA: Diagnosis not present

## 2014-09-24 DIAGNOSIS — S0502XD Injury of conjunctiva and corneal abrasion without foreign body, left eye, subsequent encounter: Secondary | ICD-10-CM | POA: Diagnosis not present

## 2014-11-18 DIAGNOSIS — I119 Hypertensive heart disease without heart failure: Secondary | ICD-10-CM | POA: Diagnosis not present

## 2014-11-18 DIAGNOSIS — R7301 Impaired fasting glucose: Secondary | ICD-10-CM | POA: Diagnosis not present

## 2014-11-18 DIAGNOSIS — E782 Mixed hyperlipidemia: Secondary | ICD-10-CM | POA: Diagnosis not present

## 2014-11-18 DIAGNOSIS — Z6830 Body mass index (BMI) 30.0-30.9, adult: Secondary | ICD-10-CM | POA: Diagnosis not present

## 2014-11-18 DIAGNOSIS — E669 Obesity, unspecified: Secondary | ICD-10-CM | POA: Diagnosis not present

## 2014-11-18 DIAGNOSIS — Z23 Encounter for immunization: Secondary | ICD-10-CM | POA: Diagnosis not present

## 2014-11-28 DIAGNOSIS — N179 Acute kidney failure, unspecified: Secondary | ICD-10-CM | POA: Diagnosis not present

## 2014-12-12 ENCOUNTER — Other Ambulatory Visit: Payer: Self-pay | Admitting: Interventional Cardiology

## 2014-12-12 MED ORDER — DILTIAZEM HCL ER COATED BEADS 180 MG PO CP24: ORAL_CAPSULE | ORAL | Status: DC

## 2015-01-23 ENCOUNTER — Other Ambulatory Visit: Payer: Self-pay | Admitting: *Deleted

## 2015-01-23 MED ORDER — METOPROLOL SUCCINATE ER 25 MG PO TB24: 25.0000 mg | ORAL_TABLET | Freq: Every day | ORAL | Status: DC

## 2015-02-07 ENCOUNTER — Other Ambulatory Visit: Payer: Self-pay | Admitting: Interventional Cardiology

## 2015-02-24 ENCOUNTER — Other Ambulatory Visit: Payer: Self-pay | Admitting: Interventional Cardiology

## 2015-03-06 ENCOUNTER — Other Ambulatory Visit: Payer: Self-pay | Admitting: Interventional Cardiology

## 2015-03-08 ENCOUNTER — Other Ambulatory Visit: Payer: Self-pay | Admitting: Interventional Cardiology

## 2015-03-18 ENCOUNTER — Encounter: Payer: Self-pay | Admitting: Interventional Cardiology

## 2015-03-26 ENCOUNTER — Other Ambulatory Visit: Payer: Self-pay | Admitting: Interventional Cardiology

## 2015-03-26 NOTE — Telephone Encounter (Signed)
JAYTEN KISSAM  03/08/2015  Refill  MRN:  [id number]   Description: 65 year old male  Provider: Jettie Booze, MD  Department: [facility]       Call Documentation     No notes of this type exist for this encounter.     Encounter MyChart Messages     No messages in this encounter     Approved      Disp Refills Start End    diltiazem (CARDIZEM CD) 180 MG 24 hr capsule 30 capsule 0 03/10/2015     Sig:  take 1 capsule by mouth twice a day    Class:  Normal    DAW:  No    Comment:  Please call our office to schedule an yearly appointment for future refills. [phone]. Thank you 2nd attempt    Authorizing Provider:  Jettie Booze, MD    Ordering User:  [place]     [facility] [place], [place]

## 2015-03-27 ENCOUNTER — Telehealth: Payer: Self-pay | Admitting: Interventional Cardiology

## 2015-03-27 ENCOUNTER — Ambulatory Visit (INDEPENDENT_AMBULATORY_CARE_PROVIDER_SITE_OTHER): Payer: Medicare Other | Admitting: Physician Assistant

## 2015-03-27 ENCOUNTER — Encounter: Payer: Self-pay | Admitting: Physician Assistant

## 2015-03-27 VITALS — BP 115/70 | HR 60 | Ht 73.0 in | Wt 227.2 lb

## 2015-03-27 DIAGNOSIS — E785 Hyperlipidemia, unspecified: Secondary | ICD-10-CM | POA: Diagnosis not present

## 2015-03-27 DIAGNOSIS — I480 Paroxysmal atrial fibrillation: Secondary | ICD-10-CM

## 2015-03-27 DIAGNOSIS — I951 Orthostatic hypotension: Secondary | ICD-10-CM | POA: Diagnosis not present

## 2015-03-27 DIAGNOSIS — I4891 Unspecified atrial fibrillation: Secondary | ICD-10-CM | POA: Diagnosis not present

## 2015-03-27 DIAGNOSIS — I10 Essential (primary) hypertension: Secondary | ICD-10-CM

## 2015-03-27 LAB — CBC WITH DIFFERENTIAL/PLATELET
BASOS ABS: 0.1 10*3/uL (ref 0.0–0.1)
BASOS PCT: 1 % (ref 0–1)
EOS ABS: 0.1 10*3/uL (ref 0.0–0.7)
EOS PCT: 2 % (ref 0–5)
HCT: 46.5 % (ref 39.0–52.0)
Hemoglobin: 15.4 g/dL (ref 13.0–17.0)
LYMPHS PCT: 40 % (ref 12–46)
Lymphs Abs: 2.3 10*3/uL (ref 0.7–4.0)
MCH: 31.9 pg (ref 26.0–34.0)
MCHC: 33.1 g/dL (ref 30.0–36.0)
MCV: 96.3 fL (ref 78.0–100.0)
MPV: 11.6 fL (ref 8.6–12.4)
Monocytes Absolute: 0.6 10*3/uL (ref 0.1–1.0)
Monocytes Relative: 10 % (ref 3–12)
Neutro Abs: 2.7 10*3/uL (ref 1.7–7.7)
Neutrophils Relative %: 47 % (ref 43–77)
PLATELETS: 178 10*3/uL (ref 150–400)
RBC: 4.83 MIL/uL (ref 4.22–5.81)
RDW: 13.6 % (ref 11.5–15.5)
WBC: 5.7 10*3/uL (ref 4.0–10.5)

## 2015-03-27 LAB — BASIC METABOLIC PANEL
BUN: 12 mg/dL (ref 7–25)
CALCIUM: 9.6 mg/dL (ref 8.6–10.3)
CO2: 28 mmol/L (ref 20–31)
Chloride: 99 mmol/L (ref 98–110)
Creat: 1 mg/dL (ref 0.70–1.25)
Glucose, Bld: 93 mg/dL (ref 65–99)
POTASSIUM: 4.6 mmol/L (ref 3.5–5.3)
Sodium: 137 mmol/L (ref 135–146)

## 2015-03-27 LAB — TSH: TSH: 2.52 m[IU]/L (ref 0.40–4.50)

## 2015-03-27 MED ORDER — METOPROLOL SUCCINATE ER 25 MG PO TB24: 25.0000 mg | ORAL_TABLET | Freq: Every evening | ORAL | Status: DC

## 2015-03-27 MED ORDER — DILTIAZEM HCL ER COATED BEADS 180 MG PO CP24: 180.0000 mg | ORAL_CAPSULE | Freq: Every day | ORAL | Status: DC

## 2015-03-27 NOTE — Patient Instructions (Signed)
Medication Instructions:  DECREASE Cardizem CD to 180 mg once daily. CHANGE Toprol-XL to 25 mg once daily in the evening. See your medication list. Refills have been sent to your pharmacy for Toprol-XL and Cardizem CD.  Labwork: TODAY - BMET, CBC, TSH  Testing/Procedures: None   Follow-Up: Dr. Casandra Doffing in April as planned. Call for sooner follow up if your dizziness does not get better.  Any Other Special Instructions Will Be Listed Below (If Applicable). Remember to get up slowly. Drink plenty of fluids. Eat a little extra salt today (Soup, Pretzels, etc)  If you need a refill on your cardiac medications before your next appointment, please call your pharmacy.

## 2015-03-27 NOTE — Progress Notes (Signed)
Cardiology Office Note:    Date:  03/27/2015   ID:  Gary [name], DOB 08/15/1950, MRN [id number]  PCP:  Gary Neer, MD  Cardiologist:  Dr. Casandra [name]   Electrophysiologist:  Dr. Cristopher [name]   No chief complaint on file.   History of Present Illness:     Gary [name] is a 65 y.o. male with a hx of HTN, HL, recurrent atrial flutter, tachycardia mediated cardiomyopathy, atrial fibrillation controlled on flecainide. He is status post RFCA 5/07. LV function returned to normal in NSR.  Last seen by Dr. Irish [name] 1/16.   Patient called in today complaining of dizziness and falling. He is added on for further evaluation.  Since January 1, he has been exercising more and eating better. He's lost several pounds. About 1-2 weeks ago he awoke in the middle of the night to go the bathroom and noticed that he was dizzy. He describes true near syncope and fell to the ground. He did not injure himself. He has not had any frank syncope. He has had occasional episodes of feeling dizzy when he stands since that time. He denies any recent fevers, chills, cough, vomiting, diarrhea. He did take colchicine recently for gout. This is resolved. He did run out of Toprol and got it refilled yesterday. He took it last night and again this morning when he would usually take it. Today, he feels somewhat dizzy. He denies chest pain, shortness of breath, orthopnea, PND or edema. Denies palpitations.   Past Medical History  Diagnosis Date  . Obesity   . Hyperlipidemia   . A-fib ([place])   . Atrial flutter ([place])   . Insomnia   . Heart disease   . Back pain   . Cardiomyopathy ([place])     No past surgical history on file.  Current Medications: Outpatient Prescriptions Prior to Visit  Medication Sig Dispense Refill  . amitriptyline (ELAVIL) 50 MG tablet TAKE 1-2 TABLETS BY MOUTH (50- 100 MG MAX) DAILY AT BEDTIME    . aspirin 81 MG tablet Take 81 mg by mouth daily.    [name] atorvastatin (LIPITOR) 10 MG tablet  Take 10 mg by mouth daily.    . colchicine 0.6 MG tablet Take 0.6 mg by mouth daily as needed (FOR GOUT).     . flecainide (TAMBOCOR) 100 MG tablet take 1 tablet by mouth twice a day 60 tablet 3  . Multiple Vitamin (MULTIVITAMIN) capsule Take 1 capsule by mouth daily.    . valACYclovir (VALTREX) 1000 MG tablet Take 1,000 mg by mouth daily as needed (FOR COLD SORES). As directed    . diltiazem (CARDIZEM CD) 180 MG 24 hr capsule take 1 capsule by mouth twice a day 30 capsule 0  . metoprolol succinate (TOPROL-XL) 25 MG 24 hr tablet take 1 tablet by mouth once daily 30 tablet 1  . Omega-3 Fatty Acids (FISH OIL PO) Take 2 tablets by mouth daily.     [name] VITAMIN E PO Take 1 tablet by mouth daily.    . cyclobenzaprine (FLEXERIL) 10 MG tablet 10 mg as needed. Reported on 03/27/2015     No facility-administered medications prior to visit.     Allergies:   Penicillins   Social History   Social History  . Marital Status: Married    Spouse Name: N/A  . Number of Children: N/A  . Years of Education: N/A   Social History Main Topics  . Smoking status: Former Research scientist (life sciences)  . Smokeless tobacco: None  .  Alcohol Use: None  . Drug Use: None  . Sexual Activity: Not Asked   Other Topics Concern  . None   Social History Narrative     Family History:  The patient's family history includes Heart attack in his father; Hypertension in his father and mother.   ROS:   Please see the history of present illness.    Review of Systems  Neurological: Positive for dizziness.   All other systems reviewed and are negative.   Physical Exam:    VS:  BP 115/70 mmHg  Pulse 60  Ht 6\' 1"  (1.854 m)  Wt 227 lb 3.2 oz (103.057 kg)  BMI 29.98 kg/m2    Orthostatic VS for the past 24 hrs:  BP- Lying Pulse- Lying BP- Sitting Pulse- Sitting BP- Standing at 0 minutes Pulse- Standing at 0 minutes  03/27/15 1249 121/68 mmHg 62 108/67 mmHg 62 95/60 mmHg 65   GEN: Well nourished, well developed, in no acute  distress HEENT: normal Neck: no JVD, no masses Cardiac: Normal S1/S2, RRR; no murmurs,no edema;  no carotid bruits,   Respiratory:  clear to auscultation bilaterally; no wheezing, rhonchi or rales GI: soft, nontender  MS: no deformity or atrophy Skin: warm and dry  Neuro:  no focal deficits  Psych: Alert and oriented x 3, normal affect  Wt Readings from Last 3 Encounters:  03/27/15 227 lb 3.2 oz (103.057 kg)  02/04/14 221 lb (100.245 kg)  01/08/13 228 lb 1.9 oz (103.475 kg)      Studies/Labs Reviewed:     EKG:  EKG is  ordered today.  The ekg ordered today demonstrates NSR, HR 61, normal axis, QTc 390 ms, first-degree AV block, PR 252 ms  Recent Labs: No results found for requested labs within last 365 days.   Recent Lipid Panel    Component Value Date/Time   CHOL 176 08/09/2010 0036   TRIG 234* 08/09/2010 0036   HDL 46 08/09/2010 0036   CHOLHDL 3.8 08/09/2010 0036   VLDL 47* 08/09/2010 0036   LDLCALC 83 08/09/2010 0036    Additional studies/ records that were reviewed today include:   Nuc Stress Test 1/12 EF 66%, no ischemia or scar, Low Risk  Echo 6/05 EF 55-60%, mild MR  LHC 02/2003 LM ok LAD ok RI ok LCx ok RCA ok EF 35-40%   ASSESSMENT:     1. Orthostatic hypotension   2. PAF (paroxysmal atrial fibrillation) (Gary [name])   3. Essential hypertension   4. Hyperlipidemia     PLAN:     In order of problems listed above:  1. Orthostatic hypotension - Etiology not clear. I suspect that with his diet, exercise and weight loss, his blood pressure is better than it used to be. He may not need as much antihypertensive medication as previous. He denies any recent illnesses or indications of bleeding.  -  Labs today: BMET, CBC, TSH  -  Decrease Cardizem CD to 180 mg daily, change Toprol-XL to 25 mg every afternoon  -  Use caution when standing, push fluids. Follow up sooner if no improvement  2. PAF - No apparent recurrence. Continue flecainide, beta blocker,  calcium channel blocker. He has not been on anticoagulation for years. Today, in normal sinus rhythm.  3. HTN - As noted blood pressure running lower. Adjust medications as above.  4. HL - Managed by primary care. Recent LDL 82.   Continue statin.    Medication Adjustments/Labs and Tests Ordered: Current medicines are reviewed at  length with the patient today.  Concerns regarding medicines are outlined above.  Medication changes, Labs and Tests ordered today are outlined in the Patient Instructions noted below. Patient Instructions  Medication Instructions:  DECREASE Cardizem CD to 180 mg once daily. CHANGE Toprol-XL to 25 mg once daily in the evening. See your medication list. Refills have been sent to your pharmacy for Toprol-XL and Cardizem CD.  Labwork: TODAY - BMET, CBC, TSH  Testing/Procedures: None   Follow-Up: Dr. Casandra [name] in April as planned. Call for sooner follow up if your dizziness does not get better.  Any Other Special Instructions Will Be Listed Below (If Applicable). Remember to get up slowly. Drink plenty of fluids. Eat a little extra salt today (Soup, Pretzels, etc)  If you need a refill on your cardiac medications before your next appointment, please call your pharmacy.    Signed, Richardson Dopp, PA-C  03/27/2015 1:36 PM    [facility] [place], [place], [place]  [address] Phone: [phone]; Fax: [phone]

## 2015-03-27 NOTE — Telephone Encounter (Signed)
**Note De-Identified Gary [name] Obfuscation** The pt reports that he has been dizzy on and off X 1 week and that he fell in the middle of the night last night going to bathroom. He denies any injuries from fall and did not pass out. He is requesting to be seen today and is aware that Dr Irish Lack is not in the office today, he is willing to see an APP.  I discussed with Richardson Dopp, PA-c who states that he can see the pt today at 12:10. The pt is advised and he accepted appt today at 12:10 with Scott.

## 2015-03-27 NOTE — Telephone Encounter (Signed)
Please call,pt says he have been experiencing dizziness when he stand up too fast.

## 2015-05-14 DIAGNOSIS — K1379 Other lesions of oral mucosa: Secondary | ICD-10-CM | POA: Diagnosis not present

## 2015-05-15 DIAGNOSIS — K1379 Other lesions of oral mucosa: Secondary | ICD-10-CM | POA: Diagnosis not present

## 2015-05-20 DIAGNOSIS — K1379 Other lesions of oral mucosa: Secondary | ICD-10-CM | POA: Diagnosis not present

## 2015-05-21 ENCOUNTER — Encounter: Payer: Self-pay | Admitting: Interventional Cardiology

## 2015-05-21 ENCOUNTER — Ambulatory Visit (INDEPENDENT_AMBULATORY_CARE_PROVIDER_SITE_OTHER): Payer: Medicare Other | Admitting: Interventional Cardiology

## 2015-05-21 VITALS — BP 122/80 | HR 75 | Ht 73.0 in | Wt 224.2 lb

## 2015-05-21 DIAGNOSIS — E785 Hyperlipidemia, unspecified: Secondary | ICD-10-CM

## 2015-05-21 DIAGNOSIS — I4891 Unspecified atrial fibrillation: Secondary | ICD-10-CM | POA: Diagnosis not present

## 2015-05-21 DIAGNOSIS — Z8249 Family history of ischemic heart disease and other diseases of the circulatory system: Secondary | ICD-10-CM | POA: Insufficient documentation

## 2015-05-21 DIAGNOSIS — I4892 Unspecified atrial flutter: Secondary | ICD-10-CM

## 2015-05-21 NOTE — Patient Instructions (Signed)
**Note De-identified Sanii Kukla Obfuscation** Medication Instructions:  Same-no changes  Labwork: None  Testing/Procedures: None  Follow-Up: Your physician wants you to follow-up in: 1 year. You will receive a reminder letter in the mail two months in advance. If you don't receive a letter, please call our office to schedule the follow-up appointment.      

## 2015-05-21 NOTE — Progress Notes (Signed)
Patient ID: Gary [name], male   DOB: Mar 18, 1950, 65 y.o.   MRN: [id number]     Cardiology Office Note   Date:  05/21/2015   ID:  Gary [name], DOB [birth year], MRN [id number]  PCP:  Gary Neer, MD    No chief complaint on file. f/u AFib   Wt Readings from Last 3 Encounters:  05/21/15 224 lb 3.2 oz (101.696 kg)  03/27/15 227 lb 3.2 oz (103.057 kg)  02/04/14 221 lb (100.245 kg)       History of Present Illness: Gary [name] is a 65 y.o. male  who has had AFib. He had a flutter ablation in 2007.  He had some lightheadedness a few weeks ago.  His BP meds were decreased and he has felt well.  He continues to exercise.  BP has been well Controlled.  He rides an exercise bike.  He rides for 40 minutes.    No bleeding issues, SHOB, bleeding problems.  No palpitations.    He has some type of polyp in his throat that required treatment.    Past Medical History  Diagnosis Date  . Obesity   . Hyperlipidemia   . A-fib (Gary [name])   . Atrial flutter (Gary [name])   . Insomnia   . Heart disease   . Back pain   . Cardiomyopathy Gary [name])     History reviewed. No pertinent past surgical history.   Current Outpatient Prescriptions  Medication Sig Dispense Refill  . amitriptyline (ELAVIL) 50 MG tablet TAKE 1-2 TABLETS BY MOUTH (50- 100 MG MAX) DAILY AT BEDTIME    . aspirin 81 MG tablet Take 81 mg by mouth daily.    [name] atorvastatin (LIPITOR) 10 MG tablet Take 10 mg by mouth daily.    . colchicine 0.6 MG tablet Take 0.6 mg by mouth daily as needed (FOR GOUT).     [name] diltiazem (CARDIZEM CD) 180 MG 24 hr capsule Take 1 capsule (180 mg total) by mouth daily. 30 capsule 11  . flecainide (TAMBOCOR) 100 MG tablet take 1 tablet by mouth twice a day 60 tablet 3  . metoprolol succinate (TOPROL-XL) 25 MG 24 hr tablet Take 1 tablet (25 mg total) by mouth every evening. 30 tablet 11  . Multiple Vitamin (MULTIVITAMIN) capsule Take 1 capsule by mouth daily.    . Omega-3 Fatty Acids (FISH OIL) 1000 MG CAPS  TAKE 2 TABLETS BY MOUTH (2000 MG TOTAL) DAILY    . valACYclovir (VALTREX) 1000 MG tablet Take 1,000 mg by mouth daily as needed (FOR COLD SORES). As directed    . vitamin E (VITAMIN E) 400 UNIT capsule Take 400 Units by mouth daily.     No current facility-administered medications for this visit.    Allergies:   Penicillins    Social History:  The patient  reports that he has quit smoking. He does not have any smokeless tobacco history on file.   Family History:  The patient's family history includes Heart attack in his father; Hypertension in his father and mother.    ROS:  Please see the history of present illness.   Otherwise, review of systems are positive for throat pain.   All other systems are reviewed and negative.    PHYSICAL EXAM: VS:  BP 122/80 mmHg  Pulse 75  Ht 6\' 1"  (1.854 m)  Wt 224 lb 3.2 oz (101.696 kg)  BMI 29.59 kg/m2  SpO2 98% , BMI Body mass index is 29.59 kg/(m^2). GEN: Well nourished, well developed, in  no acute distress HEENT: normal Neck: no JVD, carotid bruits, or masses Cardiac: RRR; no murmurs, rubs, or gallops,no edema  Respiratory:  clear to auscultation bilaterally, normal work of breathing GI: soft, nontender, nondistended, + BS MS: no deformity or atrophy Skin: warm and dry, no rash Neuro:  Strength and sensation are intact Psych: euthymic mood, full affect     Recent Labs: 03/27/2015: BUN 12; Creat 1.00; Hemoglobin 15.4; Platelets 178; Potassium 4.6; Sodium 137; TSH 2.52   Lipid Panel    Component Value Date/Time   CHOL 176 08/09/2010 0036   TRIG 234* 08/09/2010 0036   HDL 46 08/09/2010 0036   CHOLHDL 3.8 08/09/2010 0036   VLDL 47* 08/09/2010 0036   LDLCALC 83 08/09/2010 0036     Other studies Reviewed: Additional studies/ records that were reviewed today with results demonstrating: LDL 82, HDL 35, TG 74 in 2/17.   ASSESSMENT AND PLAN:  1. AFlutter/AFib: s/p ablation for atrial flutter many years ago, Dr. Lovena Le.  He has not  had any recurrent AFib.  Heat cath was negative in the 2000s some time. Managing well on flecainide. 2. Hyperlipidemia: atorvastatin helping.  TG better. 3. Family h/o CAD: Father had MI at age 50.   71. BP stable. Continue current meds.   Current medicines are reviewed at length with the patient today.  The patient concerns regarding his medicines were addressed.  The following changes have been made:  No change  Labs/ tests ordered today include:  No orders of the defined types were placed in this encounter.    Recommend 150 minutes/week of aerobic exercise Low fat, low carb, high fiber diet recommended  Disposition:   FU in 1 year   Teresita Madura., MD  05/21/2015 1:31 PM    [facility] [place], [place], [place]  [address] Phone: [phone]; Fax: [phone]

## 2015-06-21 ENCOUNTER — Other Ambulatory Visit: Payer: Self-pay | Admitting: Interventional Cardiology

## 2015-08-15 DIAGNOSIS — H2513 Age-related nuclear cataract, bilateral: Secondary | ICD-10-CM | POA: Diagnosis not present

## 2015-10-17 DIAGNOSIS — G8929 Other chronic pain: Secondary | ICD-10-CM | POA: Diagnosis not present

## 2015-10-17 DIAGNOSIS — M79671 Pain in right foot: Secondary | ICD-10-CM | POA: Diagnosis not present

## 2015-10-17 DIAGNOSIS — L84 Corns and callosities: Secondary | ICD-10-CM | POA: Diagnosis not present

## 2015-10-28 ENCOUNTER — Ambulatory Visit (INDEPENDENT_AMBULATORY_CARE_PROVIDER_SITE_OTHER): Payer: Medicare Other | Admitting: Sports Medicine

## 2015-10-28 ENCOUNTER — Encounter: Payer: Self-pay | Admitting: Sports Medicine

## 2015-10-28 VITALS — BP 127/67 | Ht 72.0 in | Wt 220.0 lb

## 2015-10-28 DIAGNOSIS — M79672 Pain in left foot: Secondary | ICD-10-CM | POA: Diagnosis not present

## 2015-10-28 DIAGNOSIS — M79671 Pain in right foot: Secondary | ICD-10-CM

## 2015-10-28 DIAGNOSIS — M7742 Metatarsalgia, left foot: Secondary | ICD-10-CM

## 2015-10-29 NOTE — Progress Notes (Signed)
   Subjective:    Patient ID: GRALIN SHIPES, male    DOB: 1950-11-19, 65 y.o.   MRN: [id number]  HPI chief complaint: Left foot pain  65 year old male comes in today at the request of Dr.Shaw for evaluation of left foot pain. He has a history of chronic bilateral foot pain but developed acute left foot pain 3-4 weeks ago when he went for a slow walk/run. He is an avid walker but running was new for him. Shortly afterwards he began to have pain that he localizes to the second and third MTP joints on his left foot. He also developed some swelling across the dorsum of his foot. He has been taking some anti-inflammatory medicine which has helped with the swelling. He has a history of plantar fasciitis and as a result has used orthotics in the past. No numbness or tingling into his toes. No trauma that he can recall.  Past medical history reviewed Medications reviewed Allergies reviewed    Review of Systems    as above Objective:   Physical Exam  Well-developed, well-nourished. No acute distress. Vital signs reviewed  Examination of his feet in the standing position shows a fairly well-preserved longitudinal arch but a complete collapse of the transverse arch which results in callus buildup over the lateral fifth MTP joint bilaterally. He is tender to palpation over the second metatarsal head on the left foot. No tenderness to palpation along the metatarsal shaft. No pain with metatarsal squeeze. Mild diffuse soft tissue swelling across the dorsum of his foot. No palpable neuroma. Neurovascularly intact distally.  Evaluation of his gait shows him to supinate. He is not walking with a limp.      Assessment & Plan:   Left foot pain secondary to metatarsalgia versus possible metatarsal stress reaction Transverse arch collapse History of plantar fasciitis  We will construct a new orthotic today. We will take a green sports insole and add a scaphoid pad and a metatarsal pad. We will also add  a lateral post to help correct his supination. He is leaving for a trip to [place] and the [facility] next week. This trip will require a lot of walking. I am concerned that he may have an early metatarsal stress reaction, so I have decided to give him a postop shoe for him to take with him on his trip. He will use it if his foot pain is getting worse. He will follow-up with me in the office 1-2 weeks after he returns from vacation. If suspicion still exists about a possible stress reaction or stress fracture, then we will pursue diagnostic imaging. If he finds the temporary inserts to be comfortable then we could consider a custom orthotic.  Time spent with the patient was 30 minutes with greater than 50% of the time spent in face-to-face consultation discussing his diagnosis, treatment, and orthotic fitting.

## 2015-11-19 ENCOUNTER — Ambulatory Visit: Payer: Medicare Other | Admitting: Sports Medicine

## 2015-11-27 ENCOUNTER — Encounter: Payer: Self-pay | Admitting: Interventional Cardiology

## 2015-11-27 DIAGNOSIS — K640 First degree hemorrhoids: Secondary | ICD-10-CM | POA: Diagnosis not present

## 2015-11-27 DIAGNOSIS — Z1211 Encounter for screening for malignant neoplasm of colon: Secondary | ICD-10-CM | POA: Diagnosis not present

## 2015-11-28 ENCOUNTER — Ambulatory Visit: Payer: Medicare Other | Admitting: Sports Medicine

## 2015-11-28 ENCOUNTER — Encounter: Payer: Self-pay | Admitting: Sports Medicine

## 2015-11-28 ENCOUNTER — Ambulatory Visit: Payer: Self-pay

## 2015-11-28 ENCOUNTER — Ambulatory Visit (INDEPENDENT_AMBULATORY_CARE_PROVIDER_SITE_OTHER): Payer: Medicare Other | Admitting: Sports Medicine

## 2015-11-28 VITALS — BP 134/72

## 2015-11-28 DIAGNOSIS — M79672 Pain in left foot: Secondary | ICD-10-CM

## 2015-11-28 DIAGNOSIS — M84375A Stress fracture, left foot, initial encounter for fracture: Secondary | ICD-10-CM

## 2015-11-28 NOTE — Progress Notes (Signed)
   Subjective:    Patient ID: Gary [name], male    DOB: 1950/05/21, 65 y.o.   MRN: [id number]  HPI   Patient comes in today for follow-up on left foot pain. Overall, his pain has not improved. He was able to enjoy his trip to [place] and the [facility] recently. He did quite a bit of walking and, although his foot was sore, he did not feel the need for his postop shoe. During that trip he did notice persistent swelling on the dorsum of his foot and some mild erythema. Since returning home his swelling has improved but his pain is essentially the same as it was when I saw him one month ago. He localizes his pain to the distal second metatarsal. He does find the orthotics to be comfortable.    Review of Systems     Objective:   Physical Exam Well-developed, well-nourished. No acute distress  Left foot: There is mild soft tissue swelling dorsally on the foot. There is palpable callus along the distal second metatarsal shaft. Patient is tender to palpation in this area as well. No erythema. Neurovascularly intact distally. Walking with a slight limp.  MSK ultrasound of the left foot was performed. Limited images of the distal second metatarsal were obtained. There is an area of cortical breakthrough and surrounding fluid at the distal second metatarsal just proximal to the MTP joint. This is seen on both long and short view. The short view also shows evidence of soft callus formation. Findings are consistent with a healing distal second metatarsal stress fracture.       Assessment & Plan:   Distal second metatarsal stress fracture, left foot  Since the patient has persistent pain and swelling we will immobilize his foot in a postop shoe for the next 3 weeks. He may continue to ride his exercise bike but will refrain from recreational walking. Follow-up with me in 3 weeks for reevaluation and repeat ultrasound. Once his stress fracture has healed, we will need to construct him some  custom orthotics. We could consider this at his next visit. His current green sports insoles have scaphoid pads, metatarsal pads, and lateral posts.

## 2015-12-22 ENCOUNTER — Encounter: Payer: Self-pay | Admitting: Sports Medicine

## 2015-12-22 ENCOUNTER — Ambulatory Visit (INDEPENDENT_AMBULATORY_CARE_PROVIDER_SITE_OTHER): Payer: Medicare Other | Admitting: Sports Medicine

## 2015-12-22 VITALS — BP 122/66 | Ht 72.0 in | Wt 220.0 lb

## 2015-12-22 DIAGNOSIS — M84375D Stress fracture, left foot, subsequent encounter for fracture with routine healing: Secondary | ICD-10-CM | POA: Diagnosis present

## 2015-12-22 NOTE — Progress Notes (Addendum)
   Subjective:    Patient ID: Gary [name], male    DOB: 1950/12/22, 65 y.o.   MRN: [id number]  HPI   Patient comes in today for follow-up on his left foot distal second metatarsal stress fracture. Overall, he feels about 50-60% better. He has been wearing his postop shoe. His swelling has improved. He does state that he still gets pain with plantar flexion or dorsiflexion of the second toe. He continues to localize his pain to the distal second metatarsal. He has been able to ride an exercise bike without any pain.     Review of Systems     Objective:   Physical Exam  Well-developed, well-nourished. No acute distress  Left foot: There is palpable callus over the distal second metatarsal. He does still have some tenderness to palpation here. Previous swelling across the dorsum of his foot has resolved. Neurovascularly intact distally. Walking without a noticeable limp.  MSK ultrasound of the left foot was performed. Limited images of the distal second metatarsal were obtained. There is still an area of cortical breakthrough at the distal second metatarsal but there appears to be good callus formation surrounding the stress fracture. Callus is best seen on the short axis view. Findings are consistent with a healing distal second metatarsal stress fracture.       Assessment & Plan:   Distal second metatarsal stress fracture, left foot  Patient still lacks ultrasound evidence of complete fracture healing but clinically he is doing very well. I think he needs to remain in his postop shoe for another 3 weeks but he can start to remove it more when walking around the house as long as he is not experiencing any pain. He will return to the office in 3 weeks and we will repeat his ultrasound at that time. I still think that he may benefit from custom orthotics at some point down the road.

## 2016-01-12 ENCOUNTER — Ambulatory Visit: Payer: Medicare Other

## 2016-01-12 ENCOUNTER — Encounter: Payer: Self-pay | Admitting: Sports Medicine

## 2016-01-12 ENCOUNTER — Ambulatory Visit (INDEPENDENT_AMBULATORY_CARE_PROVIDER_SITE_OTHER): Payer: Medicare Other | Admitting: Sports Medicine

## 2016-01-12 ENCOUNTER — Ambulatory Visit
Admission: RE | Admit: 2016-01-12 | Discharge: 2016-01-12 | Disposition: A | Discharge status: Home / Self Care | Payer: Medicare Other | Source: Ambulatory Visit | Attending: Sports Medicine | Admitting: Sports Medicine

## 2016-01-12 VITALS — BP 140/74 | Ht 72.0 in | Wt 220.0 lb

## 2016-01-12 DIAGNOSIS — M79672 Pain in left foot: Secondary | ICD-10-CM

## 2016-01-12 NOTE — Progress Notes (Signed)
   Subjective:    Patient ID: Gary [name], male    DOB: March 24, 1950, 65 y.o.   MRN: [id number]  HPI   Patient comes in today for follow-up on left second metatarsal stress fracture. He is still complaining of pain but it is primarily on the plantar aspect of his foot. He has been wearing his postop shoe. In fact, he is able to ambulate with little to no pain. His pain is intermittent. Sometimes occurs at night. He has not noticed any swelling.    Review of Systems As above    Objective:   Physical Exam  Well-developed, well-nourished. No acute distress  Left foot: There is no tenderness to palpation along the distal second metatarsal. No swelling. No pain with metatarsal squeeze. He is tender to palpation at the metatarsal head on the plantar aspect of the foot. Neurovascularly intact distally.  MSK ultrasound of the left foot with attention to the distal left second metatarsal shows good callus formation over the previous stress fracture.  X-rays of the left foot done today demonstrate only a very faint lucency at the very distal second metatarsal at the metatarsal head. Otherwise unremarkable.      Assessment & Plan:   Left foot pain with ultrasound evidence of a well healing stress fracture  Although the x-ray does not show significant callus, this is definitely seen on his ultrasound. I think he can wean from his postop shoe and resume wearing his green sports insoles with metatarsal pads. I think some of his residual discomfort is from metatarsalgia and I'm hopeful that the metatarsal pads will help. I asked the patient to follow-up with me in 4 weeks either here in the office or via telephone for check on his progress. I think he can start to increase activity as tolerated. We also discussed the possibility of custom orthotics at a later date if he finds the temporary green inserts to be helpful.

## 2016-02-19 ENCOUNTER — Encounter: Payer: Self-pay | Admitting: Sports Medicine

## 2016-02-19 ENCOUNTER — Ambulatory Visit (INDEPENDENT_AMBULATORY_CARE_PROVIDER_SITE_OTHER): Payer: PPO | Admitting: Sports Medicine

## 2016-02-19 VITALS — BP 132/73 | HR 70 | Ht 73.0 in | Wt 230.0 lb

## 2016-02-19 DIAGNOSIS — M79672 Pain in left foot: Secondary | ICD-10-CM

## 2016-02-19 NOTE — Progress Notes (Signed)
    Subjective:  Gary [name] is a 66 y.o. male who presents to the [facility] today with a chief complaint of left foot [name].   HPI: Left Foot [name] Patient with a history of a left second metatarsal stress fracture. At his appointment about 4 weeks ago he started weaning off the postop show and started wearing green sports insoles with metatarsal pads. Since then, [name] has not improved. He is still having significant [name] with walking. He also has [name] at night while sleeping when the sheets lay on his foot. [name] is primarily along the second metatarsal head with radiating [name] into his second toe. He has reproducible [name] with any sort of passive or active toe motion.  Right Hip [name] Patient also complains of right hip [name] that started a few days ago while riding his stationary bike. He has been riding about 45 minutes to an hour every day. It is much improved today.  Right Shoulder [name] Patient also complains of right shoulder [name] while working out. Specifically while doing bench presses and flys. No current [name]. [name] started 2 days ago.   ROS: Per HPI  PMH: Smoking history reviewed.    Objective:  Physical Exam: BP 132/73   Pulse 70   Ht 6\' 1"  (1.854 m)   Wt 230 lb (104.3 kg)   BMI 30.34 kg/m   Gen: NAD, resting comfortably MSK: - Left Foot: Tender to palpation along head of distal second metatarsal. No deformities. Neurovascularly intact. -Right Hip: No deformities. Tender just super and posterior to greater troachanter.  - Right Shoulder: No deformities. Tender over AC joint. Negative empty can. Normal strength in all fields.  Assessment/Plan:  Second Left Metatarsal Stress Fracture- R/O neuroma / intermetatarsal bursitis Concerning that he has had persistent symptoms. Will check MRI to rule out other pathologies including neuromas or degenerative changes.   Right Hip [name] Likely gluteus strain secondary to overuse. Recommended patient to cut back on distances and only ride  every other day until [name] improves.  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 No signs of rotator cuff pathology. Modify load bearing exercises on the shoulder.   Gary [name]. Gary [name], [name] Resident PGY-3 02/19/2016 12:43 PM   Patient seen and evaluated with the resident. I agree with the above plan of care. Patient's left foot [name] has been present now for several months. His initial stress fracture is well-healed. X-rays done at his last office visit showed no evidence of persistent fracture. 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 He may also be experiencing symptoms secondary to a neuroma, intermetatarsal bursitis, or degenerative changes at the second MTP joint. Therefore, I need to proceed with an MRI scan before recommending further treatment. We will make him a new pair of temporary orthotics as he does think that his old orthotics have been helpful but they are wearing out. Treatment of his right hip [name] and right shoulder [name] are as above.

## 2016-02-24 ENCOUNTER — Other Ambulatory Visit: Payer: Self-pay | Admitting: Sports Medicine

## 2016-02-26 ENCOUNTER — Inpatient Hospital Stay: Admission: RE | Admit: 2016-02-26 | Payer: Medicare Other | Source: Ambulatory Visit

## 2016-02-29 ENCOUNTER — Ambulatory Visit
Admission: RE | Admit: 2016-02-29 | Discharge: 2016-02-29 | Disposition: A | Discharge status: Home / Self Care | Payer: Medicare Other | Source: Ambulatory Visit | Attending: Sports Medicine | Admitting: Sports Medicine

## 2016-02-29 DIAGNOSIS — M79672 Pain in left foot: Secondary | ICD-10-CM | POA: Diagnosis not present

## 2016-03-01 ENCOUNTER — Telehealth: Payer: Self-pay | Admitting: Sports Medicine

## 2016-03-01 NOTE — Telephone Encounter (Signed)
I spoke with Gary [name] on the phone today after reviewing the MRI of his left foot. 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 This was suspected on x-ray as well. Remainder of his foot is unremarkable. His prior stress fracture is no longer evident. This is an unusual finding in someone of his age. I recommended consultation with Dr. Doran Durand at [facility] orthopedics to discuss further management. Patient is in agreement with this plan. I'll defer further workup and treatment to the discretion of Dr. Doran Durand. Patient will follow-up with me as needed.

## 2016-03-17 ENCOUNTER — Other Ambulatory Visit: Payer: Self-pay | Admitting: Interventional Cardiology

## 2016-04-06 ENCOUNTER — Other Ambulatory Visit: Payer: Self-pay | Admitting: Physician Assistant

## 2016-04-06 DIAGNOSIS — I480 Paroxysmal atrial fibrillation: Secondary | ICD-10-CM

## 2016-04-13 ENCOUNTER — Other Ambulatory Visit: Payer: Self-pay | Admitting: Physician Assistant

## 2016-04-13 DIAGNOSIS — I480 Paroxysmal atrial fibrillation: Secondary | ICD-10-CM

## 2016-04-19 DIAGNOSIS — M79672 Pain in left foot: Secondary | ICD-10-CM | POA: Diagnosis not present

## 2016-04-19 DIAGNOSIS — M9272 Juvenile osteochondrosis of metatarsus, left foot: Secondary | ICD-10-CM | POA: Diagnosis not present

## 2016-04-19 DIAGNOSIS — G8929 Other chronic pain: Secondary | ICD-10-CM | POA: Diagnosis not present

## 2016-04-22 ENCOUNTER — Telehealth: Payer: Self-pay

## 2016-04-22 NOTE — Telephone Encounter (Signed)
No further cardiac testing needed before surgery.

## 2016-04-22 NOTE — Telephone Encounter (Signed)
Clearance faxed to [employer] at [facility] at [phone]. Confirmation was received.

## 2016-04-22 NOTE — Telephone Encounter (Signed)
Request for surgical clearance:  1. What type of surgery is being performed? Left II Toe: Left 2nd MT Weil osteotomy and rotational osteotomy; 5th MT bunionette excision   2. When is this surgery scheduled? TBD   3. Are there any medications that need to be held prior to surgery and how long?   4. Name of physician performing surgery? Wylene Simmer, MD   5. What is your office phone and fax number? Phone- [phone], Fax- [phone]

## 2016-05-18 ENCOUNTER — Other Ambulatory Visit: Payer: Self-pay | Admitting: Interventional Cardiology

## 2016-05-18 ENCOUNTER — Other Ambulatory Visit: Payer: Self-pay | Admitting: *Deleted

## 2016-05-18 DIAGNOSIS — I480 Paroxysmal atrial fibrillation: Secondary | ICD-10-CM

## 2016-05-18 MED ORDER — DILTIAZEM HCL ER COATED BEADS 180 MG PO CP24: 180.0000 mg | ORAL_CAPSULE | Freq: Every day | ORAL | 0 refills | Status: DC

## 2016-05-23 NOTE — Progress Notes (Signed)
Patient ID: Gary [name], male   DOB: [birth year], 66 y.o.   MRN: [id number]     Cardiology Office Note   Date:  05/24/2016   ID:  Tregan, Read 1950-02-20, MRN [id number]  PCP:  [name], MD    Chief Complaint  Patient presents with  . Follow-up    PER RECALL IN EPIC  f/u AFib   Wt Readings from Last 3 Encounters:  05/24/16 236 lb (107 kg)  02/19/16 230 lb (104.3 kg)  01/12/16 220 lb (99.8 kg)       History of Present Illness: Gary [name] is a 66 y.o. male  who has had AFib. He had a flutter ablation in 2007.  He had some lightheadedness a few weeks ago.  His BP meds were decreased and he has felt well.    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  He stopped exercise for a while and has been eating more.  He gained weight.  He has had a stress fracture in his foot that has limited him.  He is resuming exercise.  BP has been well Controlled.  He rides an exercise bike.  He rides for 30-40 minutes. With stress, he is drinking more.     No bleeding issues, SHOB, bleeding problems.  No palpitations.    He has some type of polyp in his throat that required treatment.  No f/u issues from the polyp he had in his throat.    Past Medical History:  Diagnosis Date  . A-fib ([place])   . Atrial flutter ([place])   . Back pain   . Cardiomyopathy ([place])   . Heart disease   . Hyperlipidemia   . Insomnia   . Obesity     No past surgical history on file.   Current Outpatient Prescriptions  Medication Sig Dispense Refill  . amitriptyline (ELAVIL) 50 MG tablet TAKE 1-2 TABLETS BY MOUTH (50- 100 MG MAX) DAILY AT BEDTIME    . aspirin 81 MG tablet Take 81 mg by mouth daily.    [name] atorvastatin (LIPITOR) 10 MG tablet Take 10 mg by mouth daily.    . colchicine 0.6 MG tablet Take 0.6 mg by mouth daily as needed (FOR GOUT).     [name] diltiazem (CARTIA XT) 180 MG 24 hr capsule Take 1 capsule (180 mg total) by mouth daily. Please contact  office to schedule follow up [phone] 15 capsule 0  . flecainide (TAMBOCOR) 100 MG tablet take 1 tablet by mouth twice a day 60 tablet 9  . metoprolol succinate (TOPROL-XL) 25 MG 24 hr tablet Take 1 tablet (25 mg total) by mouth every evening. *Please call and schedule a one year follow up appointment* 30 tablet 0  . Multiple Vitamin (MULTIVITAMIN) capsule Take 1 capsule by mouth daily.    . Omega-3 Fatty Acids (FISH OIL) 1200 MG CAPS Take 1,200 mg by mouth 2 (two) times daily.     . valACYclovir (VALTREX) 1000 MG tablet Take 1,000 mg by mouth daily as needed (FOR COLD SORES). As directed    . vitamin E (VITAMIN E) 400 UNIT capsule Take 400 Units by mouth daily.     No current facility-administered medications for this visit.     Allergies:   Penicillins    Social History:  The patient  reports that he has quit smoking. He has never used smokeless tobacco.   Family History:  The patient's family history includes Heart  attack in his father; Hypertension in his father and mother.    ROS:  Please see the history of present illness.   Otherwise, review of systems are positive for throat pain.   All other systems are reviewed and negative.    PHYSICAL EXAM: VS:  BP 124/82   Pulse 65   Ht 6\' 1"  (1.854 m)   Wt 236 lb (107 kg)   SpO2 97%   BMI 31.14 kg/m  , BMI Body mass index is 31.14 kg/m. GEN: Well nourished, well developed, in no acute distress  HEENT: normal  Neck: no JVD, carotid bruits, or masses Cardiac: RRR; no murmurs, rubs, or gallops,no edema  Respiratory:  clear to auscultation bilaterally, normal work of breathing GI: soft, nontender, nondistended, + BS MS: no deformity or atrophy  Skin: warm and dry, no rash Neuro:  Strength and sensation are intact Psych: euthymic mood, full affect     Recent Labs: No results found for requested labs within last 8760 hours.   Lipid Panel    Component Value Date/Time   CHOL 176 08/09/2010 0036   TRIG 234 (H) 08/09/2010  0036   HDL 46 08/09/2010 0036   CHOLHDL 3.8 08/09/2010 0036   VLDL 47 (H) 08/09/2010 0036   LDLCALC 83 08/09/2010 0036     Other studies Reviewed: Additional studies/ records that were reviewed today with results demonstrating: LDL 82, HDL 35, TG 74 in 2/17.   ASSESSMENT AND PLAN:  1. AFlutter/AFib: s/p ablation for atrial flutter many years ago, Dr. Lovena Le.  He has not had any recurrent AFib.  Heat cath was negative in the 2000s some time. Managing well on flecainide.  No side effects or breakthrough.  Minimize alcohol use as well as this can be a trigger for AFib.   2. Hyperlipidemia: atorvastatin helping.  TG better.  Followed by Dr. Brigitte Pulse.  Checked in late 2017. 3. Family h/o CAD: Father had MI at age 26.   63. BP stable. Continue current meds.  He has gained weight since the last visit.  He will try to return to his regular diet.  He will decrease alcohol as well to decrease carb intake.   5. Obesity: spoke about weight loss.   Current medicines are reviewed at length with the patient today.  The patient concerns regarding his medicines were addressed.  The following changes have been made:  No change  Labs/ tests ordered today include:  No orders of the defined types were placed in this encounter.   Recommend 150 minutes/week of aerobic exercise Low fat, low carb, high fiber diet recommended  Disposition:   FU in 1 year   Signed, Larae Grooms, MD  05/24/2016 12:19 PM    [facility] [address], [place], [place]  [id number] Phone: [phone]; Fax: [phone]

## 2016-05-24 ENCOUNTER — Encounter: Payer: Self-pay | Admitting: Interventional Cardiology

## 2016-05-24 ENCOUNTER — Ambulatory Visit (INDEPENDENT_AMBULATORY_CARE_PROVIDER_SITE_OTHER): Payer: PPO | Admitting: Interventional Cardiology

## 2016-05-24 ENCOUNTER — Encounter (INDEPENDENT_AMBULATORY_CARE_PROVIDER_SITE_OTHER): Payer: Self-pay

## 2016-05-24 VITALS — BP 124/82 | HR 65 | Ht 73.0 in | Wt 236.0 lb

## 2016-05-24 DIAGNOSIS — E669 Obesity, unspecified: Secondary | ICD-10-CM | POA: Diagnosis not present

## 2016-05-24 DIAGNOSIS — I4892 Unspecified atrial flutter: Secondary | ICD-10-CM

## 2016-05-24 DIAGNOSIS — I480 Paroxysmal atrial fibrillation: Secondary | ICD-10-CM

## 2016-05-24 DIAGNOSIS — E782 Mixed hyperlipidemia: Secondary | ICD-10-CM | POA: Diagnosis not present

## 2016-05-24 DIAGNOSIS — Z8249 Family history of ischemic heart disease and other diseases of the circulatory system: Secondary | ICD-10-CM

## 2016-05-24 MED ORDER — DILTIAZEM HCL ER COATED BEADS 180 MG PO CP24: 180.0000 mg | ORAL_CAPSULE | Freq: Every day | ORAL | 3 refills | Status: DC

## 2016-05-24 NOTE — Patient Instructions (Signed)
Medication Instructions:  Your physician recommends that you continue on your current medications as directed. Please refer to the Current Medication list given to you today.   A REFILL WAS SENT IN FOR THE DILTIAZEM  Labwork: NONE ORDERED   Testing/Procedures: NONE ORDERED  Follow-Up: Your physician wants you to follow-up in: [place] DR. VARANASI  You will receive a reminder letter in the mail two months in advance. If you don't receive a letter, please call our office to schedule the follow-up appointment.   Any Other Special Instructions Will Be Listed Below (If Applicable).     If you need a refill on your cardiac medications before your next appointment, please call your pharmacy.

## 2016-06-02 ENCOUNTER — Other Ambulatory Visit: Payer: Self-pay | Admitting: Physician Assistant

## 2016-06-02 DIAGNOSIS — I480 Paroxysmal atrial fibrillation: Secondary | ICD-10-CM

## 2016-06-17 ENCOUNTER — Ambulatory Visit (INDEPENDENT_AMBULATORY_CARE_PROVIDER_SITE_OTHER): Payer: PPO | Admitting: Interventional Cardiology

## 2016-06-17 ENCOUNTER — Encounter (INDEPENDENT_AMBULATORY_CARE_PROVIDER_SITE_OTHER): Payer: Self-pay

## 2016-06-17 ENCOUNTER — Encounter: Payer: Self-pay | Admitting: Interventional Cardiology

## 2016-06-17 ENCOUNTER — Telehealth: Payer: Self-pay | Admitting: Interventional Cardiology

## 2016-06-17 VITALS — BP 118/90 | HR 91 | Ht 73.0 in | Wt 230.8 lb

## 2016-06-17 DIAGNOSIS — I4891 Unspecified atrial fibrillation: Secondary | ICD-10-CM | POA: Diagnosis not present

## 2016-06-17 DIAGNOSIS — Z8249 Family history of ischemic heart disease and other diseases of the circulatory system: Secondary | ICD-10-CM

## 2016-06-17 MED ORDER — APIXABAN 5 MG PO TABS: 5.0000 mg | ORAL_TABLET | Freq: Two times a day (BID) | ORAL | 3 refills | Status: DC

## 2016-06-17 NOTE — Telephone Encounter (Signed)
New Message  Pt voiced wanting to speak with nurse and for nurse to return call

## 2016-06-17 NOTE — Patient Instructions (Addendum)
Medication Instructions:  Your physician has recommended you make the following change in your medication:   1- START Eliquis 5 mg twice a day  Labwork: CBC, BMET, PT/INR  Testing/Procedures: Your physician has requested that you have a TEE on 06/23/16. During a TEE, sound waves are used to create images of your heart. It provides your doctor with information about the size and shape of your heart and how well your heart's chambers and valves are working. In this test, a transducer is attached to the end of a flexible tube that's guided down your throat and into your esophagus (the tube leading from you mouth to your stomach) to get a more detailed image of your heart. You are not awake for the procedure. Please see the instruction sheet given to you today. For further information please visit [email or web address].   Your physician has recommended that you have a Cardioversion (DCCV) on 06/23/16. Electrical Cardioversion uses a jolt of electricity to your heart either through paddles or wired patches attached to your chest. This is a controlled, usually prescheduled, procedure. Defibrillation is done under light anesthesia in the hospital, and you usually go home the day of the procedure. This is done to get your heart back into a normal rhythm. You are not awake for the procedure. Please see the instruction sheet given to you today.    Follow-Up: Your physician recommends that you schedule a follow-up appointment after TEE/Cardioversion with [name] for management of Atrial Fibrillation   Any Other Special Instructions Will Be Listed Below (If Applicable).     If you need a refill on your cardiac medications before your next appointment, please call your pharmacy.

## 2016-06-17 NOTE — Progress Notes (Addendum)
Patient ID: Gary [name], male   DOB: 03-15-1950, 66 y.o.   MRN: [id number]     Cardiology Office Note   Date:  06/17/2016   ID:  Gary, [name] 01/15/1951, MRN [id number]  PCP:  Gary Neer, MD    No chief complaint on file. f/u AFib   Wt Readings from Last 3 Encounters:  06/17/16 230 lb 12.8 oz (104.7 kg)  05/24/16 236 lb (107 kg)  02/19/16 230 lb (104.3 kg)       History of Present Illness: Gary [name] is a 66 y.o. male  who has had AFib. He had a flutter ablation in 2007.    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  He stopped exercise for a while and has been eating more.  He gained weight.  He has had a stress fracture in his foot that has limited him.  He is resuming exercise.  BP has been well Controlled.  He rides an exercise bike.  He rides for 30-40 minutes. With stress, he is drinking more.   The family stress has persisted.  No bleeding issues, SHOB.   He has some type of polyp in his throat that required treatment.  No f/u issues from the polyp he had in his throat.  Since the last visit, he has had palpitations.  He has been more tired.  Denies :  Dizziness. Leg edema. Nitroglycerin use. Orthopnea.  Paroxysmal nocturnal dyspnea. Shortness of breath. Syncope.   He can function, but just does not feel right.     Past Medical History:  Diagnosis Date  . A-fib ([place])   . Atrial flutter ([place])   . Back pain   . Cardiomyopathy ([place])   . Heart disease   . Hyperlipidemia   . Insomnia   . Obesity     No past surgical history on file.   Current Outpatient Prescriptions  Medication Sig Dispense Refill  . amitriptyline (ELAVIL) 50 MG tablet TAKE 1-2 TABLETS BY MOUTH (50- 100 MG MAX) DAILY AT BEDTIME    . aspirin 81 MG tablet Take 81 mg by mouth daily.    [name] atorvastatin (LIPITOR) 10 MG tablet Take 10 mg by mouth daily.    . colchicine 0.6 MG tablet Take 0.6 mg by mouth daily as needed (FOR GOUT).       [name] diltiazem (CARTIA XT) 180 MG 24 hr capsule Take 1 capsule (180 mg total) by mouth daily. 90 capsule 3  . flecainide (TAMBOCOR) 100 MG tablet take 1 tablet by mouth twice a day 60 tablet 9  . metoprolol succinate (TOPROL-XL) 25 MG 24 hr tablet Take 1 tablet (25 mg total) by mouth daily. 30 tablet 11  . Multiple Vitamin (MULTIVITAMIN) capsule Take 1 capsule by mouth daily.    . Omega-3 Fatty Acids (FISH OIL) 1200 MG CAPS Take 1,200 mg by mouth 2 (two) times daily.     . valACYclovir (VALTREX) 1000 MG tablet Take 1,000 mg by mouth daily as needed (FOR COLD SORES). As directed    . vitamin E (VITAMIN E) 400 UNIT capsule Take 400 Units by mouth daily.     No current facility-administered medications for this visit.     Allergies:   Penicillins    Social History:  The patient  reports that he has quit smoking. He has never used smokeless tobacco.   Family History:  The patient's family history includes Heart attack in his father;  Hypertension in his father and mother. Father's MI at age 29   ROS:  Please see the history of present illness.   Otherwise, review of systems are positive for palpitations and fatigue.   All other systems are reviewed and negative.    PHYSICAL EXAM: VS:  BP 118/90 (BP Location: Right Arm, Patient Position: Sitting, Cuff Size: Large)   [name] 91   Ht 6\' 1"  (1.854 m)   Wt 230 lb 12.8 oz (104.7 kg)   SpO2 98%   BMI 30.45 kg/m  , BMI Body mass index is 30.45 kg/m. GEN: Well nourished, well developed, in no acute distress  HEENT: normal  Neck: no JVD, carotid bruits, or masses Cardiac: irregularly irregular; no murmurs, rubs, or gallops,no edema  Respiratory:  clear to auscultation bilaterally, normal work of breathing GI: soft, nontender, nondistended,  MS: no deformity or atrophy  Skin: warm and dry, no rash Neuro:  Strength and sensation are intact Psych: euthymic mood, full affect   ECG: AFib, rate controlled   Recent Labs: No results found for  requested labs within last 8760 hours.   Lipid Panel    Component Value Date/Time   CHOL 176 08/09/2010 0036   TRIG 234 (H) 08/09/2010 0036   HDL 46 08/09/2010 0036   CHOLHDL 3.8 08/09/2010 0036   VLDL 47 (H) 08/09/2010 0036   LDLCALC 83 08/09/2010 0036     Other studies Reviewed: Additional studies/ records that were reviewed today with results demonstrating: LDL 82, HDL 35, TG 74 in 2/17.   ASSESSMENT AND PLAN:  1. AFlutter/AFib: s/p ablation for atrial flutter many years ago, Gary [name]. He is back in AFib.   Start Eliquis 5 mg by mouth twice a day. Plan for TEE/cardioversion next week. Risks and benefits of the procedure were explained and all questions were answered.  Will also refer to electrophysiology to see whether or not he would need a different antiarrhythmic or whether or not he would be considered for ablation.  May need a TTE at some point as well, but given his sx, will try to get him out of AFib ASAP. 2. Hyperlipidemia: Continue atorvastatin. Triglycerides improved at last check in 2017. Followed by Dr. Brigitte [name]. 3. Family h/o CAD: Father had MI at age 7.   46. BP stable. He has lost some weight. Blood pressure has been stable. He is going through a lot of stress at this time. 5. Obesity: Continue dietary changes and avoiding alcohol to help lose weight.   Current medicines are reviewed at length with the patient today.  The patient concerns regarding his medicines were addressed.  The following changes have been made:  No change  Labs/ tests ordered today include:  No orders of the defined types were placed in this encounter.   Recommend 150 minutes/week of aerobic exercise Low fat, low carb, high fiber diet recommended  Disposition:   FU in 1 year   Signed, Gary Grooms, MD  06/17/2016 12:20 PM    [facility] [place], [place], [place]  [address] Phone: [phone]; Fax: [phone]

## 2016-06-18 LAB — BASIC METABOLIC PANEL
BUN/Creatinine Ratio: 12 (ref 10–24)
BUN: 13 mg/dL (ref 8–27)
CHLORIDE: 96 mmol/L (ref 96–106)
CO2: 25 mmol/L (ref 18–29)
CREATININE: 1.05 mg/dL (ref 0.76–1.27)
Calcium: 9.9 mg/dL (ref 8.6–10.2)
GFR calc Af Amer: 84 mL/min/{1.73_m2} (ref >59)
GFR calc non Af Amer: 73 mL/min/{1.73_m2} (ref >59)
GLUCOSE: 96 mg/dL (ref 65–99)
Potassium: 4.7 mmol/L (ref 3.5–5.2)
SODIUM: 139 mmol/L (ref 134–144)

## 2016-06-18 LAB — CBC
HEMOGLOBIN: 17 g/dL (ref 13.0–17.7)
Hematocrit: 50 % (ref 37.5–51.0)
MCH: 32.4 pg (ref 26.6–33.0)
MCHC: 34 g/dL (ref 31.5–35.7)
MCV: 95 fL (ref 79–97)
Platelets: 207 10*3/uL (ref 150–379)
RBC: 5.24 x10E6/uL (ref 4.14–5.80)
RDW: 13.6 % (ref 12.3–15.4)
WBC: 6.3 10*3/uL (ref 3.4–10.8)

## 2016-06-18 LAB — PROTIME-INR
INR: 1 (ref 0.8–1.2)
PROTHROMBIN TIME: 10.9 s (ref 9.1–12.0)

## 2016-06-21 ENCOUNTER — Other Ambulatory Visit: Payer: Self-pay | Admitting: Interventional Cardiology

## 2016-06-23 ENCOUNTER — Encounter (HOSPITAL_COMMUNITY): Payer: Self-pay | Admitting: *Deleted

## 2016-06-23 ENCOUNTER — Ambulatory Visit (HOSPITAL_COMMUNITY)
Admission: RE | Admit: 2016-06-23 | Discharge: 2016-06-23 | Disposition: A | Discharge status: Home / Self Care | Payer: PPO | Source: Ambulatory Visit | Attending: Cardiovascular Disease | Admitting: Cardiovascular Disease

## 2016-06-23 ENCOUNTER — Encounter (HOSPITAL_COMMUNITY)
Admission: RE | Disposition: A | Discharge status: Home / Self Care | Payer: Self-pay | Source: Ambulatory Visit | Attending: Cardiovascular Disease

## 2016-06-23 ENCOUNTER — Ambulatory Visit (HOSPITAL_COMMUNITY): Payer: PPO

## 2016-06-23 ENCOUNTER — Ambulatory Visit (HOSPITAL_COMMUNITY): Payer: PPO | Admitting: Certified Registered"

## 2016-06-23 DIAGNOSIS — I4891 Unspecified atrial fibrillation: Secondary | ICD-10-CM | POA: Insufficient documentation

## 2016-06-23 DIAGNOSIS — I429 Cardiomyopathy, unspecified: Secondary | ICD-10-CM | POA: Diagnosis not present

## 2016-06-23 DIAGNOSIS — I4892 Unspecified atrial flutter: Secondary | ICD-10-CM | POA: Insufficient documentation

## 2016-06-23 DIAGNOSIS — Z5309 Procedure and treatment not carried out because of other contraindication: Secondary | ICD-10-CM | POA: Diagnosis not present

## 2016-06-23 DIAGNOSIS — Z9889 Other specified postprocedural states: Secondary | ICD-10-CM | POA: Insufficient documentation

## 2016-06-23 DIAGNOSIS — Z6830 Body mass index (BMI) 30.0-30.9, adult: Secondary | ICD-10-CM | POA: Insufficient documentation

## 2016-06-23 DIAGNOSIS — I519 Heart disease, unspecified: Secondary | ICD-10-CM | POA: Diagnosis not present

## 2016-06-23 DIAGNOSIS — Z79899 Other long term (current) drug therapy: Secondary | ICD-10-CM | POA: Diagnosis not present

## 2016-06-23 DIAGNOSIS — E785 Hyperlipidemia, unspecified: Secondary | ICD-10-CM | POA: Diagnosis not present

## 2016-06-23 DIAGNOSIS — Z636 Dependent relative needing care at home: Secondary | ICD-10-CM | POA: Diagnosis not present

## 2016-06-23 DIAGNOSIS — Z8249 Family history of ischemic heart disease and other diseases of the circulatory system: Secondary | ICD-10-CM | POA: Diagnosis not present

## 2016-06-23 DIAGNOSIS — Z87891 Personal history of nicotine dependence: Secondary | ICD-10-CM | POA: Diagnosis not present

## 2016-06-23 DIAGNOSIS — R002 Palpitations: Secondary | ICD-10-CM | POA: Diagnosis not present

## 2016-06-23 DIAGNOSIS — Z7982 Long term (current) use of aspirin: Secondary | ICD-10-CM | POA: Diagnosis not present

## 2016-06-23 DIAGNOSIS — Z880 Allergy status to penicillin: Secondary | ICD-10-CM | POA: Diagnosis not present

## 2016-06-23 DIAGNOSIS — E669 Obesity, unspecified: Secondary | ICD-10-CM | POA: Diagnosis not present

## 2016-06-23 DIAGNOSIS — G4700 Insomnia, unspecified: Secondary | ICD-10-CM | POA: Insufficient documentation

## 2016-06-23 SURGERY — INVASIVE LAB ABORTED CASE
Anesthesia: General

## 2016-06-23 MED ORDER — PROPOFOL 500 MG/50ML IV EMUL
INTRAVENOUS | Status: DC | PRN
  Administered 2016-06-23: 75 ug/kg/min via INTRAVENOUS

## 2016-06-23 MED ORDER — BUTAMBEN-TETRACAINE-BENZOCAINE 2-2-14 % EX AERO
INHALATION_SPRAY | CUTANEOUS | Status: DC | PRN
  Administered 2016-06-23: 2 via TOPICAL

## 2016-06-23 MED ORDER — LACTATED RINGERS IV SOLN
INTRAVENOUS | Status: DC | PRN
  Administered 2016-06-23: 14:00:00 via INTRAVENOUS

## 2016-06-23 MED ORDER — LIDOCAINE HCL (CARDIAC) 20 MG/ML IV SOLN
INTRAVENOUS | Status: DC | PRN
  Administered 2016-06-23: 20 mg via INTRATRACHEAL

## 2016-06-23 MED ORDER — PROPOFOL 10 MG/ML IV BOLUS
INTRAVENOUS | Status: DC | PRN
  Administered 2016-06-23 (×2): 40 mg via INTRAVENOUS

## 2016-06-23 NOTE — Anesthesia Procedure Notes (Signed)
Procedure Name: MAC Date/Time: 06/23/2016 2:00 PM Performed by: Teressa Lower Pre-anesthesia Checklist: Patient identified, Emergency Drugs available and Suction available Oxygen Delivery Method: Nasal cannula

## 2016-06-23 NOTE — Interval H&P Note (Signed)
History and Physical Interval Note:  06/23/2016 1:23 PM  Gary [name]  has presented today for surgery, with the diagnosis of AFIB  The various methods of treatment have been discussed with the patient and family. After consideration of risks, benefits and other options for treatment, the patient has consented to  Procedure(s): TRANSESOPHAGEAL ECHOCARDIOGRAM (TEE) (N/A) CARDIOVERSION (N/A) as a surgical intervention .  The patient's history has been reviewed, patient examined, no change in status, stable for surgery.  I have reviewed the patient's chart and labs.  Questions were answered to the patient's satisfaction.     Jenkins Rouge

## 2016-06-23 NOTE — Interval H&P Note (Signed)
History and Physical Interval Note:  06/23/2016 1:25 PM  Gary [name]  has presented today for surgery, with the diagnosis of AFIB  The various methods of treatment have been discussed with the patient and family. After consideration of risks, benefits and other options for treatment, the patient has consented to  Procedure(s): TRANSESOPHAGEAL ECHOCARDIOGRAM (TEE) (N/A) CARDIOVERSION (N/A) as a surgical intervention .  The patient's history has been reviewed, patient examined, no change in status, stable for surgery.  I have reviewed the patient's chart and labs.  Questions were answered to the patient's satisfaction.     Jenkins Rouge

## 2016-06-23 NOTE — H&P (View-Only) (Signed)
Patient ID: Gary [name], male   DOB: 12-18-1950, 66 y.o.   MRN: [id number]     Cardiology Office Note   Date:  06/17/2016   ID:  Keo, Schirmer [birth year], MRN [id number]  PCP:  [name], MD    No chief complaint on file. f/u AFib   Wt Readings from Last 3 Encounters:  06/17/16 230 lb 12.8 oz (104.7 kg)  05/24/16 236 lb (107 kg)  02/19/16 230 lb (104.3 kg)       History of Present Illness: Gary [name] is a 66 y.o. male  who has had AFib. He had a flutter ablation in 2007.    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  He stopped exercise for a while and has been eating more.  He gained weight.  He has had a stress fracture in his foot that has limited him.  He is resuming exercise.  BP has been well Controlled.  He rides an exercise bike.  He rides for 30-40 minutes. With stress, he is drinking more.   The family stress has persisted.  No bleeding issues, SHOB.   He has some type of polyp in his throat that required treatment.  No f/u issues from the polyp he had in his throat.  Since the last visit, he has had palpitations.  He has been more tired.  Denies :  Dizziness. Leg edema. Nitroglycerin use. Orthopnea.  Paroxysmal nocturnal dyspnea. Shortness of breath. Syncope.   He can function, but just does not feel right.     Past Medical History:  Diagnosis Date  . A-fib ([place])   . Atrial flutter ([place])   . Back pain   . Cardiomyopathy ([place])   . Heart disease   . Hyperlipidemia   . Insomnia   . Obesity     No past surgical history on file.   Current Outpatient Prescriptions  Medication Sig Dispense Refill  . amitriptyline (ELAVIL) 50 MG tablet TAKE 1-2 TABLETS BY MOUTH (50- 100 MG MAX) DAILY AT BEDTIME    . aspirin 81 MG tablet Take 81 mg by mouth daily.    [name] atorvastatin (LIPITOR) 10 MG tablet Take 10 mg by mouth daily.    . colchicine 0.6 MG tablet Take 0.6 mg by mouth daily as needed (FOR GOUT).       [name] diltiazem (CARTIA XT) 180 MG 24 hr capsule Take 1 capsule (180 mg total) by mouth daily. 90 capsule 3  . flecainide (TAMBOCOR) 100 MG tablet take 1 tablet by mouth twice a day 60 tablet 9  . metoprolol succinate (TOPROL-XL) 25 MG 24 hr tablet Take 1 tablet (25 mg total) by mouth daily. 30 tablet 11  . Multiple Vitamin (MULTIVITAMIN) capsule Take 1 capsule by mouth daily.    . Omega-3 Fatty Acids (FISH OIL) 1200 MG CAPS Take 1,200 mg by mouth 2 (two) times daily.     . valACYclovir (VALTREX) 1000 MG tablet Take 1,000 mg by mouth daily as needed (FOR COLD SORES). As directed    . vitamin E (VITAMIN E) 400 UNIT capsule Take 400 Units by mouth daily.     No current facility-administered medications for this visit.     Allergies:   Penicillins    Social History:  The patient  reports that he has quit smoking. He has never used smokeless tobacco.   Family History:  The patient's family history includes Heart attack in his father;  Hypertension in his father and mother. Father's MI at age 35   ROS:  Please see the history of present illness.   Otherwise, review of systems are positive for palpitations and fatigue.   All other systems are reviewed and negative.    PHYSICAL EXAM: VS:  BP 118/90 (BP Location: Right Arm, Patient Position: Sitting, Cuff Size: Large)   Pulse 91   Ht 6\' 1"  (1.854 m)   Wt 230 lb 12.8 oz (104.7 kg)   SpO2 98%   BMI 30.45 kg/m  , BMI Body mass index is 30.45 kg/m. GEN: Well nourished, well developed, in no acute distress  HEENT: normal  Neck: no JVD, carotid bruits, or masses Cardiac: irregularly irregular; no murmurs, rubs, or gallops,no edema  Respiratory:  clear to auscultation bilaterally, normal work of breathing GI: soft, nontender, nondistended,  MS: no deformity or atrophy  Skin: warm and dry, no rash Neuro:  Strength and sensation are intact Psych: euthymic mood, full affect   ECG: AFib, rate controlled   Recent Labs: No results found for  requested labs within last 8760 hours.   Lipid Panel    Component Value Date/Time   CHOL 176 08/09/2010 0036   TRIG 234 (H) 08/09/2010 0036   HDL 46 08/09/2010 0036   CHOLHDL 3.8 08/09/2010 0036   VLDL 47 (H) 08/09/2010 0036   LDLCALC 83 08/09/2010 0036     Other studies Reviewed: Additional studies/ records that were reviewed today with results demonstrating: LDL 82, HDL 35, TG 74 in 2/17.   ASSESSMENT AND PLAN:  1. AFlutter/AFib: s/p ablation for atrial flutter many years ago, Dr. Lovena Le. He is back in AFib.   Start Eliquis 5 mg by mouth twice a day. Plan for TEE/cardioversion next week. Risks and benefits of the procedure were explained and all questions were answered.  Will also refer to electrophysiology to see whether or not he would need a different antiarrhythmic or whether or not he would be considered for ablation.  May need a TTE at some point as well, but given his sx, will try to get him out of AFib ASAP. 2. Hyperlipidemia: Continue atorvastatin. Triglycerides improved at last check in 2017. Followed by Dr. Brigitte Pulse. 3. Family h/o CAD: Father had MI at age 35.   48. BP stable. He has lost some weight. Blood pressure has been stable. He is going through a lot of stress at this time. 5. Obesity: Continue dietary changes and avoiding alcohol to help lose weight.   Current medicines are reviewed at length with the patient today.  The patient concerns regarding his medicines were addressed.  The following changes have been made:  No change  Labs/ tests ordered today include:  No orders of the defined types were placed in this encounter.   Recommend 150 minutes/week of aerobic exercise Low fat, low carb, high fiber diet recommended  Disposition:   FU in 1 year   Signed, Larae Grooms, MD  06/17/2016 12:20 PM    [facility] [place], [place], [place]  [address] Phone: [phone]; Fax: [phone]

## 2016-06-23 NOTE — H&P (View-Only) (Signed)
Patient ID: Gary [name], male   DOB: 01/03/1951, 66 y.o.   MRN: [id number]     Cardiology Office Note   Date:  05/24/2016   ID:  [name], [name] 03/14/1950, MRN [id number]  PCP:  [name], MD    Chief Complaint  Patient presents with  . Follow-up    PER RECALL IN EPIC  f/u AFib   Wt Readings from Last 3 Encounters:  05/24/16 236 lb (107 kg)  02/19/16 230 lb (104.3 kg)  01/12/16 220 lb (99.8 kg)       History of Present Illness: Gary [name] is a 66 y.o. male  who has had AFib. He had a flutter ablation in 2007.  He had some lightheadedness a few weeks ago.  His BP meds were decreased and he has felt well.    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  He stopped exercise for a while and has been eating more.  He gained weight.  He has had a stress fracture in his foot that has limited him.  He is resuming exercise.  BP has been well Controlled.  He rides an exercise bike.  He rides for 30-40 minutes. With stress, he is drinking more.     No bleeding issues, SHOB, bleeding problems.  No palpitations.    He has some type of polyp in his throat that required treatment.  No f/u issues from the polyp he had in his throat.    Past Medical History:  Diagnosis Date  . A-fib ([place])   . Atrial flutter ([place])   . Back pain   . Cardiomyopathy ([place])   . Heart disease   . Hyperlipidemia   . Insomnia   . Obesity     No past surgical history on file.   Current Outpatient Prescriptions  Medication Sig Dispense Refill  . amitriptyline (ELAVIL) 50 MG tablet TAKE 1-2 TABLETS BY MOUTH (50- 100 MG MAX) DAILY AT BEDTIME    . aspirin 81 MG tablet Take 81 mg by mouth daily.    [name] atorvastatin (LIPITOR) 10 MG tablet Take 10 mg by mouth daily.    . colchicine 0.6 MG tablet Take 0.6 mg by mouth daily as needed (FOR GOUT).     [name] diltiazem (CARTIA XT) 180 MG 24 hr capsule Take 1 capsule (180 mg total) by mouth daily. Please contact  office to schedule follow up [phone] 15 capsule 0  . flecainide (TAMBOCOR) 100 MG tablet take 1 tablet by mouth twice a day 60 tablet 9  . metoprolol succinate (TOPROL-XL) 25 MG 24 hr tablet Take 1 tablet (25 mg total) by mouth every evening. *Please call and schedule a one year follow up appointment* 30 tablet 0  . Multiple Vitamin (MULTIVITAMIN) capsule Take 1 capsule by mouth daily.    . Omega-3 Fatty Acids (FISH OIL) 1200 MG CAPS Take 1,200 mg by mouth 2 (two) times daily.     . valACYclovir (VALTREX) 1000 MG tablet Take 1,000 mg by mouth daily as needed (FOR COLD SORES). As directed    . vitamin E (VITAMIN E) 400 UNIT capsule Take 400 Units by mouth daily.     No current facility-administered medications for this visit.     Allergies:   Penicillins    Social History:  The patient  reports that he has quit smoking. He has never used smokeless tobacco.   Family History:  The patient's family history includes Heart  attack in his father; Hypertension in his father and mother.    ROS:  Please see the history of present illness.   Otherwise, review of systems are positive for throat pain.   All other systems are reviewed and negative.    PHYSICAL EXAM: VS:  BP 124/82   Pulse 65   Ht 6\' 1"  (1.854 m)   Wt 236 lb (107 kg)   SpO2 97%   BMI 31.14 kg/m  , BMI Body mass index is 31.14 kg/m. GEN: Well nourished, well developed, in no acute distress  HEENT: normal  Neck: no JVD, carotid bruits, or masses Cardiac: RRR; no murmurs, rubs, or gallops,no edema  Respiratory:  clear to auscultation bilaterally, normal work of breathing GI: soft, nontender, nondistended, + BS MS: no deformity or atrophy  Skin: warm and dry, no rash Neuro:  Strength and sensation are intact Psych: euthymic mood, full affect     Recent Labs: No results found for requested labs within last 8760 hours.   Lipid Panel    Component Value Date/Time   CHOL 176 08/09/2010 0036   TRIG 234 (H) 08/09/2010  0036   HDL 46 08/09/2010 0036   CHOLHDL 3.8 08/09/2010 0036   VLDL 47 (H) 08/09/2010 0036   LDLCALC 83 08/09/2010 0036     Other studies Reviewed: Additional studies/ records that were reviewed today with results demonstrating: LDL 82, HDL 35, TG 74 in 2/17.   ASSESSMENT AND PLAN:  1. AFlutter/AFib: s/p ablation for atrial flutter many years ago, Dr. Lovena Le.  He has not had any recurrent AFib.  Heat cath was negative in the 2000s some time. Managing well on flecainide.  No side effects or breakthrough.  Minimize alcohol use as well as this can be a trigger for AFib.   2. Hyperlipidemia: atorvastatin helping.  TG better.  Followed by Dr. Brigitte Pulse.  Checked in late 2017. 3. Family h/o CAD: Father had MI at age 40.   5. BP stable. Continue current meds.  He has gained weight since the last visit.  He will try to return to his regular diet.  He will decrease alcohol as well to decrease carb intake.   5. Obesity: spoke about weight loss.   Current medicines are reviewed at length with the patient today.  The patient concerns regarding his medicines were addressed.  The following changes have been made:  No change  Labs/ tests ordered today include:  No orders of the defined types were placed in this encounter.   Recommend 150 minutes/week of aerobic exercise Low fat, low carb, high fiber diet recommended  Disposition:   FU in 1 year   Signed, Larae Grooms, MD  05/24/2016 12:19 PM    [facility] [place], [place], [place]  [address] Phone: [phone]; Fax: [phone]

## 2016-06-23 NOTE — CV Procedure (Signed)
Attempted TEE/DCC Unable to pass probe Patient under propofol anesthesia Dr Suzette Battiest also attempted to pass Probe and was unable  Patient will be D/C F/U Dr Irish Lack to set up [facility] after traditional 3 weeks Of anticoagulation  Gary [name]

## 2016-06-23 NOTE — Transfer of Care (Signed)
Immediate Anesthesia Transfer of Care Note  Patient: Gary [name]  Procedure(s) Performed: * No procedures listed *  Patient Location: Endoscopy Unit  Anesthesia Type:MAC  Level of Consciousness: awake, alert  and oriented  Airway & Oxygen Therapy: Patient Spontanous Breathing  Post-op Assessment: Report given to RN and Post -op Vital signs reviewed and stable  Post vital signs: Reviewed and stable  Last Vitals:  Vitals:   06/23/16 1316  BP: 134/75  Pulse: 86  Resp: 14  Temp: 36.9 C    Last Pain:  Vitals:   06/23/16 1316  TempSrc: Oral         Complications: No apparent anesthesia complications

## 2016-06-23 NOTE — Progress Notes (Signed)
Patient prepared for TEE/ cardioversion.  Dr. Johnsie Cancel had difficulty passing scope to proper placement.  Dr. Ola Spurr attempted as well, but also unsuccessful.  Dr. Johnsie Cancel to inform wife that procedure will not be completed.  Will continue to monitor.

## 2016-06-23 NOTE — Anesthesia Preprocedure Evaluation (Signed)
Anesthesia Evaluation  Patient identified by MRN, date of birth, ID band Patient awake    Reviewed: Allergy & Precautions, NPO status , Patient's Chart, lab work & pertinent test results  Airway Mallampati: III  TM Distance: >3 FB Neck ROM: Full    Dental   Pulmonary former smoker,    breath sounds clear to auscultation       Cardiovascular + dysrhythmias Atrial Fibrillation  Rhythm:Regular Rate:Normal     Neuro/Psych negative neurological ROS     GI/Hepatic negative GI ROS, Neg liver ROS,   Endo/Other  negative endocrine ROS  Renal/GU negative Renal ROS     Musculoskeletal   Abdominal   Peds  Hematology negative hematology ROS (+)   Anesthesia Other Findings   Reproductive/Obstetrics                             Anesthesia Physical Anesthesia Plan  ASA: II  Anesthesia Plan: General   Post-op Pain Management:    Induction: Intravenous  Airway Management Planned: Natural Airway and Mask  Additional Equipment:   Intra-op Plan:   Post-operative Plan:   Informed Consent: I have reviewed the patients History and Physical, chart, labs and discussed the procedure including the risks, benefits and alternatives for the proposed anesthesia with the patient or authorized representative who has indicated his/her understanding and acceptance.   Dental advisory given  Plan Discussed with: CRNA  Anesthesia Plan Comments:         Anesthesia Quick Evaluation

## 2016-06-23 NOTE — Discharge Instructions (Signed)

## 2016-06-24 NOTE — Anesthesia Postprocedure Evaluation (Signed)
Anesthesia Post Note  Patient: Gary [name]  Procedure(s) Performed: Procedure(s): Aborted TEE  Patient location during evaluation: PACU Anesthesia Type: MAC Level of consciousness: awake and alert Pain management: pain level controlled Vital Signs Assessment: post-procedure vital signs reviewed and stable Respiratory status: spontaneous breathing, nonlabored ventilation, respiratory function stable and patient connected to nasal cannula oxygen Cardiovascular status: stable and blood pressure returned to baseline Anesthetic complications: no       Last Vitals:  Vitals:   06/23/16 1450 06/23/16 1500  BP: 126/69 (!) 127/93  Pulse: 85 85  Resp: 16 17  Temp:      Last Pain:  Vitals:   06/23/16 1431  TempSrc: Oral                 Tiajuana Amass

## 2016-07-05 ENCOUNTER — Encounter: Payer: Self-pay | Admitting: Internal Medicine

## 2016-07-05 ENCOUNTER — Ambulatory Visit (INDEPENDENT_AMBULATORY_CARE_PROVIDER_SITE_OTHER): Payer: PPO | Admitting: Internal Medicine

## 2016-07-05 VITALS — BP 118/84 | HR 84 | Ht 72.0 in | Wt 238.6 lb

## 2016-07-05 DIAGNOSIS — I480 Paroxysmal atrial fibrillation: Secondary | ICD-10-CM

## 2016-07-05 DIAGNOSIS — E669 Obesity, unspecified: Secondary | ICD-10-CM

## 2016-07-05 DIAGNOSIS — I481 Persistent atrial fibrillation: Secondary | ICD-10-CM

## 2016-07-05 DIAGNOSIS — I4819 Other persistent atrial fibrillation: Secondary | ICD-10-CM

## 2016-07-05 DIAGNOSIS — I10 Essential (primary) hypertension: Secondary | ICD-10-CM | POA: Diagnosis not present

## 2016-07-05 LAB — CBC WITH DIFFERENTIAL/PLATELET
BASOS: 1 %
Basophils Absolute: 0 10*3/uL (ref 0.0–0.2)
EOS (ABSOLUTE): 0.2 10*3/uL (ref 0.0–0.4)
EOS: 3 %
HEMATOCRIT: 46.5 % (ref 37.5–51.0)
Hemoglobin: 15.7 g/dL (ref 13.0–17.7)
IMMATURE GRANULOCYTES: 0 %
Immature Grans (Abs): 0 10*3/uL (ref 0.0–0.1)
LYMPHS ABS: 2.7 10*3/uL (ref 0.7–3.1)
Lymphs: 43 %
MCH: 32.9 pg (ref 26.6–33.0)
MCHC: 33.8 g/dL (ref 31.5–35.7)
MCV: 98 fL — AB (ref 79–97)
Monocytes Absolute: 0.7 10*3/uL (ref 0.1–0.9)
Monocytes: 11 %
NEUTROS ABS: 2.6 10*3/uL (ref 1.4–7.0)
Neutrophils: 42 %
Platelets: 184 10*3/uL (ref 150–379)
RBC: 4.77 x10E6/uL (ref 4.14–5.80)
RDW: 13.8 % (ref 12.3–15.4)
WBC: 6.2 10*3/uL (ref 3.4–10.8)

## 2016-07-05 LAB — BASIC METABOLIC PANEL
BUN / CREAT RATIO: 13 (ref 10–24)
BUN: 13 mg/dL (ref 8–27)
CO2: 27 mmol/L (ref 20–29)
CREATININE: 0.98 mg/dL (ref 0.76–1.27)
Calcium: 9.4 mg/dL (ref 8.6–10.2)
Chloride: 96 mmol/L (ref 96–106)
GFR, EST AFRICAN AMERICAN: 92 mL/min/{1.73_m2} (ref >59)
GFR, EST NON AFRICAN AMERICAN: 79 mL/min/{1.73_m2} (ref >59)
Glucose: 112 mg/dL — ABNORMAL HIGH (ref 65–99)
Potassium: 4.6 mmol/L (ref 3.5–5.2)
SODIUM: 134 mmol/L (ref 134–144)

## 2016-07-05 NOTE — Patient Instructions (Addendum)
Medication Instructions:  Your physician recommends that you continue on your current medications as directed. Please refer to the Current Medication list given to you today.   Labwork: Your physician recommends that you return for lab work today BMP/CBC   Testing/Procedures: Your physician has requested that you have an echocardiogram. Echocardiography is a painless test that uses sound waves to create images of your heart. It provides your doctor with information about the size and shape of your heart and how well your heart's chambers and valves are working. This procedure takes approximately one hour. There are no restrictions for this procedure.  Your physician has recommended that you have a Cardioversion (DCCV). Electrical Cardioversion uses a jolt of electricity to your heart either through paddles or wired patches attached to your chest. This is a controlled, usually prescheduled, procedure. Defibrillation is done under light anesthesia in the hospital, and you usually go home the day of the procedure. This is done to get your heart back into a normal rhythm. You are not awake for the procedure. Please see the instruction sheet given to you today.---07/20/16  Please arrive at [facility] at 9:30am Do not eat or drink after midnight the night prior to the procedure Okay to take any medications the morning of the test Will need someone to drive you home at discharge    Follow-Up:  Your physician recommends that you schedule a follow-up appointment in: 4 weeks from 07/20/16 with Roderic Palau, NP

## 2016-07-05 NOTE — Progress Notes (Signed)
Electrophysiology Office Note   Date:  07/05/2016   ID:  Gary [name], DOB [birth year], MRN [id number]  PCP:  Gary Neer, MD  Cardiologist:  Dr Gary [name] Primary Electrophysiologist: previously Dr Gary [name]   Chief Complaint  Patient presents with  . Atrial Fibrillation     History of Present Illness: Gary [name] is a 66 y.o. male who presents today for electrophysiology evaluation.   The patient presents on referral by Dr Gary [name] for EP consultation regarding therapies for atrial fibrillation.  The patient has a h/o afib and atrial flutter.  He had a nonischemic (tachycardiac mediated) cardiomyopathy in 2005.  He underwent atrial flutter ablation by Dr Gary [name] in 2007.  His EF recovered and he did well.  He developed atrial fibrillation and required cardioversion.  He has been on flecainide for several years and has not had any arrhythmias to his knowledge.  About 4 weeks ago, he was moving his cousins and was quite stressed out.  He had also consumed more than his usual ETOH  (typically 3-4 drinks per night).  He developed afib.  He reports irregular pulse, fatigue and decreased exercise tolerance.  He was evaluated by Dr Gary [name] and placed on eliquis.  He was planned for TEE guided cardioversion 06/23/16 however the TEE probe could not be passed despite multiple attempts (note reviewed).  He is therefore referred for further evaluation.  Today, he denies symptoms of chest pain, orthopnea, PND, lower extremity edema, claudication, dizziness, presyncope, syncope, bleeding, or neurologic sequela. The patient is tolerating medications without difficulties and is otherwise without complaint today.    Past Medical History:  Diagnosis Date  . Atrial flutter ([place]) 2007   s/p CTI ablation by Dr Gary [name]  . Back pain   . Hyperlipidemia   . Hypertension   . Insomnia   . Obesity   . Persistent atrial fibrillation [name])    Past Surgical History:  Procedure Laterality Date  . atrial  flutter ablation  06/10/2005   CTI ablation by Dr Gary [name] for atrial flutter     Current Outpatient Prescriptions  Medication Sig Dispense Refill  . amitriptyline (ELAVIL) 50 MG tablet TAKE 1-2 TABLETS BY MOUTH (50- 100 MG MAX) DAILY AT BEDTIME    . apixaban (ELIQUIS) 5 MG TABS tablet Take 1 tablet (5 mg total) by mouth 2 (two) times daily. 180 tablet 3  . atorvastatin (LIPITOR) 10 MG tablet Take 10 mg by mouth daily.    . colchicine 0.6 MG tablet Take 0.6 mg by mouth daily as needed (FOR GOUT).     [name] diltiazem (CARTIA XT) 180 MG 24 hr capsule Take 1 capsule (180 mg total) by mouth daily. 90 capsule 3  . flecainide (TAMBOCOR) 100 MG tablet take 1 tablet by mouth twice a day 60 tablet 9  . metoprolol succinate (TOPROL-XL) 25 MG 24 hr tablet Take 1 tablet (25 mg total) by mouth daily. 30 tablet 11  . Multiple Vitamin (MULTIVITAMIN) capsule Take 1 capsule by mouth daily.    . Omega-3 Fatty Acids (FISH OIL) 1200 MG CAPS Take 1,200 mg by mouth 2 (two) times daily.     . valACYclovir (VALTREX) 1000 MG tablet Take 1,000 mg by mouth daily as needed (FOR COLD SORES). As directed    . vitamin E (VITAMIN E) 400 UNIT capsule Take 400 Units by mouth daily.     No current facility-administered medications for this visit.     Allergies:   Penicillins   Social History:  The patient  reports that he has quit smoking. He has never used smokeless tobacco. He reports that he drinks alcohol. He reports that he does not use drugs.   Family History:  The patient's  family history includes Heart attack in his father; Hypertension in his father and mother.    ROS:  Please see the history of present illness.   All other systems are personally reviewed and negative.    PHYSICAL EXAM: VS:  BP 118/84   Pulse 84   Ht 6' (1.829 m)   Wt 238 lb 9.6 oz (108.2 kg)   SpO2 96%   BMI 32.36 kg/m  , BMI Body mass index is 32.36 kg/m. GEN: Well nourished, well developed, in no acute distress  HEENT: normal  Neck:  no JVD, carotid bruits, or masses Cardiac: iRRR; no murmurs, rubs, or gallops,no edema  Respiratory:  clear to auscultation bilaterally, normal work of breathing GI: soft, nontender, nondistended, + BS MS: no deformity or atrophy  Skin: warm and dry  Neuro:  Strength and sensation are intact Psych: euthymic mood, full affect  EKG:  EKG is ordered today. The ekg ordered today is personally reviewed and shows rate controlled afib   Recent Labs: 06/17/2016: BUN 13; Creatinine, Ser 1.05; Hemoglobin 17.0; Platelets 207; Potassium 4.7; Sodium 139  personally reviewed   Lipid Panel     Component Value Date/Time   CHOL 176 08/09/2010 0036   TRIG 234 (H) 08/09/2010 0036   HDL 46 08/09/2010 0036   CHOLHDL 3.8 08/09/2010 0036   VLDL 47 (H) 08/09/2010 0036   LDLCALC 83 08/09/2010 0036   personally reviewed   Wt Readings from Last 3 Encounters:  07/05/16 238 lb 9.6 oz (108.2 kg)  06/23/16 230 lb (104.3 kg)  06/17/16 230 lb 12.8 oz (104.7 kg)      Other studies personally reviewed: Additional studies/ records that were reviewed today include: echo 2005, prior cath, ablation, and Dr Hassell Done notes  Review of the above records today demonstrates: as above   ASSESSMENT AND PLAN:  1.  Persistent afib The patient has symptomatic persistent atrial fibrillation.  He is s/p prior ablation for atrial flutter by Dr Gary [name].  His chads2vasc score is at least 2 (age, HTN).  He has not had a recent echo.  He attributes recent episode of afib to stress of moving relatives and ETOh.   Therapeutic strategies for afib were discussed at length today.  At this time, I would advise cardioversion after 4 weeks of compliance with eliquis.  He will then follow-up in the AF clinic.  lifestyle modification was discussed at length today.  If he remains in sinus post cardioversion with flecainide then no changes are required.  If he is not successfully cardioverted or if he has soon return of his afib, then  tikosyn would be a good option.   I will order an echo to evaluate for structural changes related to his afib  2. htn Stable No change required today  3. Overweight Body mass index is 32.36 kg/m. Lifestyle modification encouraged  4. ETOh Avoidance encouraged  Follow-up:  In AF clinic 4 weeks post cardioversion and then every 3 months He will follow with Dr Gary [name] also and I will see when needed  Current medicines are reviewed at length with the patient today.   The patient does not have concerns regarding his medicines.  The following changes were made today:  none   Signed, Thompson Grayer, MD  07/05/2016 11:05 AM  [address] [place] [address] [phone] (office) [phone] (fax)

## 2016-07-13 ENCOUNTER — Other Ambulatory Visit: Payer: Self-pay

## 2016-07-13 ENCOUNTER — Ambulatory Visit (HOSPITAL_COMMUNITY): Payer: PPO | Attending: Cardiovascular Disease

## 2016-07-13 DIAGNOSIS — I420 Dilated cardiomyopathy: Secondary | ICD-10-CM | POA: Insufficient documentation

## 2016-07-13 DIAGNOSIS — I069 Rheumatic aortic valve disease, unspecified: Secondary | ICD-10-CM | POA: Diagnosis not present

## 2016-07-13 DIAGNOSIS — I480 Paroxysmal atrial fibrillation: Secondary | ICD-10-CM | POA: Diagnosis not present

## 2016-07-13 DIAGNOSIS — I4891 Unspecified atrial fibrillation: Secondary | ICD-10-CM | POA: Diagnosis not present

## 2016-07-19 ENCOUNTER — Other Ambulatory Visit: Payer: Self-pay | Admitting: Nurse Practitioner

## 2016-07-20 ENCOUNTER — Ambulatory Visit (HOSPITAL_COMMUNITY)
Admission: RE | Admit: 2016-07-20 | Discharge: 2016-07-20 | Disposition: A | Discharge status: Home / Self Care | Payer: PPO | Source: Ambulatory Visit | Attending: Cardiology | Admitting: Cardiology

## 2016-07-20 ENCOUNTER — Ambulatory Visit (HOSPITAL_COMMUNITY): Payer: PPO | Admitting: Anesthesiology

## 2016-07-20 ENCOUNTER — Encounter (HOSPITAL_COMMUNITY): Payer: Self-pay

## 2016-07-20 ENCOUNTER — Other Ambulatory Visit: Payer: Self-pay

## 2016-07-20 ENCOUNTER — Encounter (HOSPITAL_COMMUNITY)
Admission: RE | Disposition: A | Discharge status: Home / Self Care | Payer: Self-pay | Source: Ambulatory Visit | Attending: Cardiology

## 2016-07-20 DIAGNOSIS — Z7901 Long term (current) use of anticoagulants: Secondary | ICD-10-CM | POA: Diagnosis not present

## 2016-07-20 DIAGNOSIS — I429 Cardiomyopathy, unspecified: Secondary | ICD-10-CM | POA: Diagnosis not present

## 2016-07-20 DIAGNOSIS — I4892 Unspecified atrial flutter: Secondary | ICD-10-CM | POA: Diagnosis not present

## 2016-07-20 DIAGNOSIS — I499 Cardiac arrhythmia, unspecified: Secondary | ICD-10-CM | POA: Diagnosis not present

## 2016-07-20 DIAGNOSIS — Z87891 Personal history of nicotine dependence: Secondary | ICD-10-CM | POA: Diagnosis not present

## 2016-07-20 DIAGNOSIS — I10 Essential (primary) hypertension: Secondary | ICD-10-CM | POA: Diagnosis not present

## 2016-07-20 DIAGNOSIS — I4891 Unspecified atrial fibrillation: Secondary | ICD-10-CM | POA: Diagnosis not present

## 2016-07-20 DIAGNOSIS — E785 Hyperlipidemia, unspecified: Secondary | ICD-10-CM | POA: Diagnosis not present

## 2016-07-20 HISTORY — PX: CARDIOVERSION: SHX1299

## 2016-07-20 SURGERY — CARDIOVERSION
Anesthesia: General

## 2016-07-20 MED ORDER — SODIUM CHLORIDE 0.9 % IV SOLN
INTRAVENOUS | Status: DC
  Administered 2016-07-20: 10:00:00 via INTRAVENOUS

## 2016-07-20 MED ORDER — PROPOFOL 10 MG/ML IV BOLUS
INTRAVENOUS | Status: DC | PRN
  Administered 2016-07-20: 60 mg via INTRAVENOUS

## 2016-07-20 NOTE — CV Procedure (Signed)
   Procedure:   DCCV  Indication:  Symptomatic atrial fib.  Procedure Note:  The patient signed informed consent.  He has had had therapeutic anticoagulation with Elqis greater than 3 weeks.  Anesthesia was administered by Dr. Suella Broad.  Adequate airway was maintained throughout and vital followed per protocol.  He was cardioverted x 1 with 120 J of biphasic synchronized energy.  He converted to NSR.  There were no apparent complications.  The patient had normal neuro status and respiratory status post procedure with vitals stable as recorded elsewhere.    Follow up:  We will arrange follow up with Atrial Fib Clinic.  He will continue on current medical therapy.

## 2016-07-20 NOTE — Transfer of Care (Signed)
Immediate Anesthesia Transfer of Care Note  Patient: Gary [name]  Procedure(s) Performed: Procedure(s): CARDIOVERSION (N/A)  Patient Location: Endoscopy Unit  Anesthesia Type:General  Level of Consciousness: awake, alert  and oriented  Airway & Oxygen Therapy: Patient Spontanous Breathing and Patient connected to nasal cannula oxygen  Post-op Assessment: Report given to RN, Post -op Vital signs reviewed and stable and Patient moving all extremities X 4  Post vital signs: Reviewed and stable  Last Vitals:  Vitals:   07/20/16 0939  BP: 127/86  Pulse: 81  Resp: 19    Last Pain:  Vitals:   07/20/16 0939  TempSrc: Oral         Complications: No apparent anesthesia complications

## 2016-07-20 NOTE — Discharge Instructions (Signed)
Electrical Cardioversion, Care After °This sheet gives you information about how to care for yourself after your procedure. Your health care provider may also give you more specific instructions. If you have problems or questions, contact your health care provider. °What can I expect after the procedure? °After the procedure, it is common to have: °· Some redness on the skin where the shocks were given. ° °Follow these instructions at home: °· Do not drive for 24 hours if you were given a medicine to help you relax (sedative). °· Take over-the-counter and prescription medicines only as told by your health care provider. °· Ask your health care provider how to check your pulse. Check it often. °· Rest for 48 hours after the procedure or as told by your health care provider. °· Avoid or limit your caffeine use as told by your health care provider. °Contact a health care provider if: °· You feel like your heart is beating too quickly or your pulse is not regular. °· You have a serious muscle cramp that does not go away. °Get help right away if: °· You have discomfort in your chest. °· You are dizzy or you feel faint. °· You have trouble breathing or you are short of breath. °· Your speech is slurred. °· You have trouble moving an arm or leg on one side of your body. °· Your fingers or toes turn cold or blue. °This information is not intended to replace advice given to you by your health care provider. Make sure you discuss any questions you have with your health care provider. °Document Released: 11/01/2012 Document Revised: 08/15/2015 Document Reviewed: 07/18/2015 °Elsevier Interactive Patient Education © 2018 [employer] ° °

## 2016-07-20 NOTE — Anesthesia Postprocedure Evaluation (Signed)
Anesthesia Post Note  Patient: Gary [name]  Procedure(s) Performed: Procedure(s) (LRB): CARDIOVERSION (N/A)     Patient location during evaluation: PACU Anesthesia Type: General Level of consciousness: awake and alert Pain management: pain level controlled Vital Signs Assessment: post-procedure vital signs reviewed and stable Respiratory status: spontaneous breathing, nonlabored ventilation, respiratory function stable and patient connected to nasal cannula oxygen Cardiovascular status: blood pressure returned to baseline and stable Postop Assessment: no signs of nausea or vomiting Anesthetic complications: no    Last Vitals:  Vitals:   07/20/16 1050 07/20/16 1100  BP: 119/85 117/75  Pulse: 66 (!) 59  Resp: 17 12  Temp:      Last Pain:  Vitals:   07/20/16 1040  TempSrc: Oral                 Effie Berkshire

## 2016-07-20 NOTE — Anesthesia Preprocedure Evaluation (Signed)
Anesthesia Evaluation  Patient identified by MRN, date of birth, ID band Patient awake    Reviewed: Allergy & Precautions, NPO status , Patient's Chart, lab work & pertinent test results  Airway Mallampati: III  TM Distance: >3 FB Neck ROM: Full    Dental  (+) Teeth Intact   Pulmonary former smoker,    breath sounds clear to auscultation       Cardiovascular hypertension, Pt. on home beta blockers and Pt. on medications  Rhythm:Irregular     Neuro/Psych negative neurological ROS  negative psych ROS   GI/Hepatic negative GI ROS, Neg liver ROS,   Endo/Other  negative endocrine ROS  Renal/GU negative Renal ROS  negative genitourinary   Musculoskeletal negative musculoskeletal ROS (+)   Abdominal   Peds negative pediatric ROS (+)  Hematology negative hematology ROS (+)   Anesthesia Other Findings   Reproductive/Obstetrics negative OB ROS                             Echo: - Left ventricle: The cavity size was normal. Wall thickness was   normal. Systolic function was normal. The estimated ejection   fraction was in the range of 50% to 55%. Wall motion was normal;   there were no regional wall motion abnormalities. - Aortic valve: Moderately calcified annulus. - Right atrium: The atrium was mildly dilated. - Pulmonary arteries: Systolic pressure was mildly increased. PA   peak pressure: 35 mm Hg (S).  Anesthesia Physical Anesthesia Plan  ASA: III  Anesthesia Plan: General   Post-op Pain Management:    Induction: Intravenous  PONV Risk Score and Plan: 0 and Propofol  Airway Management Planned: Mask  Additional Equipment:   Intra-op Plan:   Post-operative Plan:   Informed Consent: I have reviewed the patients History and Physical, chart, labs and discussed the procedure including the risks, benefits and alternatives for the proposed anesthesia with the patient or authorized  representative who has indicated his/her understanding and acceptance.   Dental advisory given  Plan Discussed with: CRNA  Anesthesia Plan Comments:         Anesthesia Quick Evaluation

## 2016-08-17 ENCOUNTER — Ambulatory Visit (HOSPITAL_COMMUNITY)
Admission: RE | Admit: 2016-08-17 | Discharge: 2016-08-17 | Disposition: A | Discharge status: Home / Self Care | Payer: PPO | Source: Ambulatory Visit | Attending: Nurse Practitioner | Admitting: Nurse Practitioner

## 2016-08-17 ENCOUNTER — Encounter (HOSPITAL_COMMUNITY): Payer: Self-pay | Admitting: Nurse Practitioner

## 2016-08-17 VITALS — BP 124/68 | HR 60 | Ht 72.0 in | Wt 230.0 lb

## 2016-08-17 DIAGNOSIS — G4700 Insomnia, unspecified: Secondary | ICD-10-CM | POA: Insufficient documentation

## 2016-08-17 DIAGNOSIS — Z9889 Other specified postprocedural states: Secondary | ICD-10-CM | POA: Diagnosis not present

## 2016-08-17 DIAGNOSIS — Z87891 Personal history of nicotine dependence: Secondary | ICD-10-CM | POA: Diagnosis not present

## 2016-08-17 DIAGNOSIS — I4892 Unspecified atrial flutter: Secondary | ICD-10-CM | POA: Diagnosis not present

## 2016-08-17 DIAGNOSIS — E669 Obesity, unspecified: Secondary | ICD-10-CM | POA: Diagnosis not present

## 2016-08-17 DIAGNOSIS — I10 Essential (primary) hypertension: Secondary | ICD-10-CM | POA: Insufficient documentation

## 2016-08-17 DIAGNOSIS — E785 Hyperlipidemia, unspecified: Secondary | ICD-10-CM | POA: Insufficient documentation

## 2016-08-17 DIAGNOSIS — Z7901 Long term (current) use of anticoagulants: Secondary | ICD-10-CM | POA: Insufficient documentation

## 2016-08-17 DIAGNOSIS — I4819 Other persistent atrial fibrillation: Secondary | ICD-10-CM

## 2016-08-17 DIAGNOSIS — I481 Persistent atrial fibrillation: Secondary | ICD-10-CM | POA: Insufficient documentation

## 2016-08-17 NOTE — Progress Notes (Signed)
Primary Care Physician: [name], MD Referring Physician: [name] Cardiologist: [name] is a 66 y.o. male with a h/o afib on flecainide for years. He had breakthrough afib after stress over a family matter and too much alcohol. He had not been on anticoagulation and eliquis was restarted. TEE guided cardioversion was tried but the tube could not be passed. He was referred to [name]. He suggested several more weeks of anticoagulation and then cardioversion. This was done 6/26 and was successful and pt has been in SR since then. He does have a chadsvasc score of 2(htn, age) and it is advised to stay on anticoagulation long term. Echo was recently updated without any significant change.  Today, he denies symptoms of palpitations, chest pain, shortness of breath, orthopnea, PND, lower extremity edema, dizziness, presyncope, syncope, or neurologic sequela. The patient is tolerating medications without difficulties and is otherwise without complaint today.   Past Medical History:  Diagnosis Date  . Atrial flutter ([place]) 2007   s/p CTI ablation by Dr Lovena Le  . Back pain   . Hyperlipidemia   . Hypertension   . Insomnia   . Obesity   . Persistent atrial fibrillation [facility])    Past Surgical History:  Procedure Laterality Date  . atrial flutter ablation  06/10/2005   CTI ablation by Dr Lovena Le for atrial flutter  . CARDIOVERSION N/A 07/20/2016   Procedure: CARDIOVERSION;  Surgeon: Minus Breeding, MD;  [facility] ENDOSCOPY;  Service: Cardiovascular;  Laterality: N/A;  . NO PAST SURGERIES      Current Outpatient Prescriptions  Medication Sig Dispense Refill  . amitriptyline (ELAVIL) 50 MG tablet TAKE 1-2 TABLETS BY MOUTH (50- 100 MG MAX) DAILY AT BEDTIME    . apixaban (ELIQUIS) 5 MG TABS tablet Take 1 tablet (5 mg total) by mouth 2 (two) times daily. 180 tablet 3  . atorvastatin (LIPITOR) 10 MG tablet Take 10 mg by mouth daily.    . colchicine 0.6 MG tablet  Take 0.6 mg by mouth daily as needed (FOR GOUT).     [name] diltiazem (CARTIA XT) 180 MG 24 hr capsule Take 1 capsule (180 mg total) by mouth daily. 90 capsule 3  . flecainide (TAMBOCOR) 100 MG tablet take 1 tablet by mouth twice a day 60 tablet 9  . metoprolol succinate (TOPROL-XL) 25 MG 24 hr tablet Take 1 tablet (25 mg total) by mouth daily. 30 tablet 11  . Multiple Vitamin (MULTIVITAMIN) capsule Take 1 capsule by mouth daily.    . Omega-3 Fatty Acids (FISH OIL) 1200 MG CAPS Take 1,200 mg by mouth 2 (two) times daily.     . valACYclovir (VALTREX) 1000 MG tablet Take 1,000 mg by mouth daily as needed (FOR COLD SORES). As directed    . vitamin E (VITAMIN E) 400 UNIT capsule Take 400 Units by mouth daily.     No current facility-administered medications for this encounter.     Allergies  Allergen Reactions  . Penicillins     rash    Social History   Social History  . Marital status: Married    Spouse name: N/A  . Number of children: N/A  . Years of education: N/A   Occupational History  . Not on file.   Social History Main Topics  . Smoking status: Former Research scientist (life sciences)  . Smokeless tobacco: Never Used  . Alcohol use Yes     Comment: 3-4 drinks per day  . Drug use: No  .  Sexual activity: Yes   Other Topics Concern  . Not on file   Social History Narrative   Lives in [place]   Retired from [employer]    Family History  Problem Relation Age of Onset  . Heart attack Father   . Hypertension Father   . Hypertension Mother     ROS- All systems are reviewed and negative except as per the HPI above  Physical Exam: Vitals:   08/17/16 0916  BP: 124/68  Pulse: 60  Weight: 230 lb (104.3 kg)  Height: 6' (1.829 m)   Wt Readings from Last 3 Encounters:  08/17/16 230 lb (104.3 kg)  07/20/16 227 lb (103 kg)  07/05/16 238 lb 9.6 oz (108.2 kg)    Labs: Lab Results  Component Value Date   NA 134 07/05/2016   K 4.6 07/05/2016   CL 96 07/05/2016   CO2 27 07/05/2016   GLUCOSE 112  (H) 07/05/2016   BUN 13 07/05/2016   CREATININE 0.98 07/05/2016   CALCIUM 9.4 07/05/2016   Lab Results  Component Value Date   INR 1.0 06/17/2016   Lab Results  Component Value Date   CHOL 176 08/09/2010   HDL 46 08/09/2010   LDLCALC 83 08/09/2010   TRIG 234 (H) 08/09/2010     GEN- The patient is well appearing, alert and oriented x 3 today.   Head- normocephalic, atraumatic Eyes-  Sclera clear, conjunctiva pink Ears- hearing intact Oropharynx- clear Neck- supple, no JVP Lymph- no cervical lymphadenopathy Lungs- Clear to ausculation bilaterally, normal work of breathing Heart- Regular rate and rhythm, no murmurs, rubs or gallops, PMI not laterally displaced GI- soft, NT, ND, + BS Extremities- no clubbing, cyanosis, or edema MS- no significant deformity or atrophy Skin- no rash or lesion Psych- euthymic mood, full affect Neuro- strength and sensation are intact  EKG- NSR at 60 bpm, pr int 178 ms, qrs int 98 ms, qtc 406 ms Epic records reviewed Echo Study Conclusions  - Left ventricle: The cavity size was normal. Wall thickness was   normal. Systolic function was normal. The estimated ejection   fraction was in the range of 50% to 55%. Wall motion was normal;   there were no regional wall motion abnormalities. - Aortic valve: Moderately calcified annulus. - Right atrium: The atrium was mildly dilated. - Pulmonary arteries: Systolic pressure was mildly increased. PA   peak pressure: 35 mm Hg (S).-   Assessment and Plan: 1. Persistent afib  Successful cardioversion 6/26 Continue eliquis 5 mg bid for chadsvasc score of 2 Can stop asa Advised to keep alcoholic drinks to 2 a week Continue flecainide 100 mg bid Diltiazem 180 mg daily  Dr. Hollie Beach as scheduled  afib clinic as needed  Butch Penny C. Joaopedro Eschbach, [facility] [address] [place], [place] [address] [phone]

## 2016-08-30 ENCOUNTER — Ambulatory Visit (INDEPENDENT_AMBULATORY_CARE_PROVIDER_SITE_OTHER): Payer: PPO | Admitting: Sports Medicine

## 2016-08-30 VITALS — BP 122/76 | Ht 73.0 in | Wt 225.0 lb

## 2016-08-30 DIAGNOSIS — M9272 Juvenile osteochondrosis of metatarsus, left foot: Secondary | ICD-10-CM | POA: Diagnosis not present

## 2016-08-30 NOTE — Progress Notes (Signed)
  Gary [name] - 66 y.o. male MRN [id number]  Date of birth: 22-Mar-1950  SUBJECTIVE:  Including CC & ROS.  No chief complaint on file.  Left Foot pain Patient reports he is having second digit and metatarsal pain. 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 Patient decided to not have surgery as pain was not severe and only 2/10 in severity. Pain is worse with being on feet for long periods. He is not doing any specific exercise regimen at this time. He is using shoe insert with metatarsal pad and fifth ray post that does seem to help as his pain is worse when he is barefoot.   Denies numbness, tingling. Did notice one episode of toe swelling after being on feet for long period.    HISTORY: Past Medical, Surgical, Social, and Family History Reviewed & Updated per EMR.   Pertinent Historical Findings include: Stress fracture left foot Freiberg infarction of left second metatarsal  DATA REVIEWED: MRI Left foot WO contrast 02/29/16 MR findings consistent with Freiberg's infraction involving the second metatarsal head with associated mild posttraumatic arthropathy with joint effusion and synovitis.  PHYSICAL EXAM:  VS: BP:122/76  HR: bpm  TEMP: ( )  RESP:   HT:6\' 1"  (185.4 cm)   WT:225 lb (102.1 kg)  BMI:29.7 PHYSICAL EXAM: Left foot Normal appearance without erythema, obvious deformity.  Mild tenderness to palpation of left second MTP joint and left proximal second phalanx.  Limited ROM of second digit. Rotation of second toe over first with flexion.    ASSESSMENT & PLAN: See problem based charting & AVS for pt instructions. Freiberg Infraction of left second metatarsal  Patient previously declined surgical intervention as pain is not severe. At this time having 2/10 pain. Patient is benefiting from shoe insert with metatarsal pad but this is worn out. Patient may benefit further from  custom orthotics with similar metatarsal padding and fifth ray post. Will schedule patient 30 min visit to fit orthotics.

## 2016-09-13 ENCOUNTER — Ambulatory Visit (INDEPENDENT_AMBULATORY_CARE_PROVIDER_SITE_OTHER): Payer: PPO | Admitting: Sports Medicine

## 2016-09-13 DIAGNOSIS — M9272 Juvenile osteochondrosis of metatarsus, left foot: Secondary | ICD-10-CM | POA: Insufficient documentation

## 2016-09-13 DIAGNOSIS — M25552 Pain in left hip: Secondary | ICD-10-CM | POA: Diagnosis not present

## 2016-09-13 MED ORDER — METHYLPREDNISOLONE ACETATE 80 MG/ML IJ SUSP
80.0000 mg | Freq: Once | INTRAMUSCULAR | Status: AC
  Administered 2016-09-13: 80 mg via INTRAMUSCULAR

## 2016-09-13 MED ORDER — METHYLPREDNISOLONE ACETATE 40 MG/ML IJ SUSP: 40.0000 mg | Freq: Once | INTRAMUSCULAR | Status: DC

## 2016-09-13 MED ORDER — CYCLOBENZAPRINE HCL 10 MG PO TABS
ORAL_TABLET | ORAL | 0 refills | Status: DC
Start: 2016-09-13 — End: 2017-05-04

## 2016-09-13 NOTE — Assessment & Plan Note (Signed)
Seen for new custom orthotics today.    Patient was fitted for a : standard, cushioned, semi-rigid orthotic. The orthotic was heated and afterward the patient stood on the orthotic blank positioned on the orthotic stand. The patient was positioned in subtalar neutral position and 10 degrees of ankle dorsiflexion in a weight bearing stance. After completion of molding, a stable base was applied to the orthotic blank. The blank was ground to a stable position for weight bearing. Size: 14 Base: Blue EVA  Posting: bilateral 5th ray posts Additional orthotic padding: bilateral metatarsal pads

## 2016-09-13 NOTE — Progress Notes (Signed)
   Subjective:    Patient ID: Gary [name], male    DOB: Apr 25, 1950, 66 y.o.   MRN: [id number]  HPI 66 yo male presents to clinic for new orthotics.  He reports he has also been experiencing left sided low back pain and lateral hip pain for the past 5-6 days.  Denies falls, trauma or prior injury to the hip but states he has recently started going for walks.  Pain sometimes travels into anterior leg but he denies sensation of shooting pain into his left leg.  Denies numbness and tingling.  Describes pain as achy.  He has tried aspirin which does not help but has not tried heating pads or applying ice.  He reports in the past he has had 2 bouts of similar pain in the same location.  Last episode was about 15 years ago.  He was prescribed muscle relaxants and did exercises at home which helped relieve his symptoms.  He denies heavy lifting or any pivoting/twisting activity recently.   Review of Systems  Musculoskeletal: Positive for back pain and joint swelling.  Neurological: Negative for numbness.      Objective:   Physical Exam  66 yo male, NAD BP 132/88   Ht 5\' 9"  (1.753 m)   Wt 169 lb (76.7 kg)   BMI 24.96 kg/m   Left Hip:  ROM IR: 80 Deg, ER: 70 Deg, Flexion: 120 Deg, Extension: 100 Deg, Abduction: 45 Deg, Adduction: 45 Deg Strength IR: 5/5, ER: 5/5, Flexion: 5/5, Extension: 5/5, Abduction: 5/5, Adduction: 5/5 Pelvic alignment unremarkable to inspection and palpation.  Standing hip rotation and gait without trendelenburg / unsteadiness. Greater trochanter without tenderness to palpation. No tenderness over piriformis and greater trochanter. No SI joint tenderness and normal minimal SI movement. Neuro: motor strength 5/5, sensation is normal.  Negative straight leg test.      Assessment & Plan:   Left hip pain Of unclear etiology.  Unlikely sciatica as he denies pain radiating down leg and negative straight leg test.   Unlikely trochanteric bursitis as he is not particularly  tender to palpation on exam.     -IM Depo-Medrol injection in office today  -Pt provided with Rx for Flexeril  -Return precautions reviewed   Freiberg's infraction of L second metatarsal  Seen for new custom orthotics today.    Patient was fitted for a : standard, cushioned, semi-rigid orthotic. The orthotic was heated and afterward the patient stood on the orthotic blank positioned on the orthotic stand. The patient was positioned in subtalar neutral position and 10 degrees of ankle dorsiflexion in a weight bearing stance. After completion of molding, a stable base was applied to the orthotic blank. The blank was ground to a stable position for weight bearing. Size: 14 Base: Blue EVA  Posting: bilateral 5th ray posts Additional orthotic padding: bilateral metatarsal pads   Patient seen and evaluated with the resident. I agree with the above plan of care. Total of 30 minutes was spent with the patient with greater than 50% of the time spent in face-to-face consultation discussing his new onset left hip pain, orthotic construction, orthotic instruction, and fitting.

## 2016-09-13 NOTE — Assessment & Plan Note (Signed)
Of unclear etiology.  Unlikely sciatica as he denies pain radiating down leg and negative straight leg test.   Unlikely trochanteric bursitis as he is not particularly tender to palpation on exam.     -IM Depo-Medrol injection in office today  -Pt provided with Rx for Flexeril  -Return precautions reviewed

## 2016-09-22 DIAGNOSIS — M549 Dorsalgia, unspecified: Secondary | ICD-10-CM | POA: Diagnosis not present

## 2016-09-22 DIAGNOSIS — Z1159 Encounter for screening for other viral diseases: Secondary | ICD-10-CM | POA: Diagnosis not present

## 2016-09-22 DIAGNOSIS — M109 Gout, unspecified: Secondary | ICD-10-CM | POA: Diagnosis not present

## 2016-09-22 DIAGNOSIS — I119 Hypertensive heart disease without heart failure: Secondary | ICD-10-CM | POA: Diagnosis not present

## 2016-09-22 DIAGNOSIS — R7301 Impaired fasting glucose: Secondary | ICD-10-CM | POA: Diagnosis not present

## 2016-09-22 DIAGNOSIS — Z125 Encounter for screening for malignant neoplasm of prostate: Secondary | ICD-10-CM | POA: Diagnosis not present

## 2016-09-22 DIAGNOSIS — F1010 Alcohol abuse, uncomplicated: Secondary | ICD-10-CM | POA: Diagnosis not present

## 2016-09-22 DIAGNOSIS — I429 Cardiomyopathy, unspecified: Secondary | ICD-10-CM | POA: Diagnosis not present

## 2016-09-22 DIAGNOSIS — Z23 Encounter for immunization: Secondary | ICD-10-CM | POA: Diagnosis not present

## 2016-09-22 DIAGNOSIS — Z0000 Encounter for general adult medical examination without abnormal findings: Secondary | ICD-10-CM | POA: Diagnosis not present

## 2016-09-22 DIAGNOSIS — E782 Mixed hyperlipidemia: Secondary | ICD-10-CM | POA: Diagnosis not present

## 2016-09-22 DIAGNOSIS — I480 Paroxysmal atrial fibrillation: Secondary | ICD-10-CM | POA: Diagnosis not present

## 2017-01-05 DIAGNOSIS — R05 Cough: Secondary | ICD-10-CM | POA: Diagnosis not present

## 2017-01-05 DIAGNOSIS — J9801 Acute bronchospasm: Secondary | ICD-10-CM | POA: Diagnosis not present

## 2017-01-12 ENCOUNTER — Ambulatory Visit
Admission: RE | Admit: 2017-01-12 | Discharge: 2017-01-12 | Disposition: A | Discharge status: Home / Self Care | Payer: PPO | Source: Ambulatory Visit | Attending: Family Medicine | Admitting: Family Medicine

## 2017-01-12 ENCOUNTER — Other Ambulatory Visit: Payer: Self-pay | Admitting: Family Medicine

## 2017-01-12 DIAGNOSIS — R059 Cough, unspecified: Secondary | ICD-10-CM

## 2017-01-12 DIAGNOSIS — R05 Cough: Secondary | ICD-10-CM

## 2017-01-12 DIAGNOSIS — R062 Wheezing: Secondary | ICD-10-CM

## 2017-01-12 DIAGNOSIS — J069 Acute upper respiratory infection, unspecified: Secondary | ICD-10-CM | POA: Diagnosis not present

## 2017-01-13 ENCOUNTER — Other Ambulatory Visit: Payer: Self-pay | Admitting: Interventional Cardiology

## 2017-01-13 MED ORDER — FLECAINIDE ACETATE 100 MG PO TABS: 100.0000 mg | ORAL_TABLET | Freq: Two times a day (BID) | ORAL | 1 refills | Status: DC

## 2017-01-25 HISTORY — PX: EYE SURGERY: SHX253

## 2017-01-27 ENCOUNTER — Telehealth: Payer: Self-pay | Admitting: Cardiology

## 2017-01-27 NOTE — Telephone Encounter (Signed)
Pt called in reporting having 2 separate episodes of indigestion today while out shopping with his wife today. Took antiacid and improved with first episode then took Maloxx with second. Later this evening he felt his heart was "out of rhythm" for short period of time. Took HR and BP with both being stable. At the time of call he was asymptomatic. Discussed ER precautions the with patient. Given he was feeling well at the time of call, instructed I would send a message to arrange for f/u in the office given he felt his symptoms were out of his normal. Patient agreeable to this plan and thanked me for follow up call.

## 2017-01-31 ENCOUNTER — Encounter: Payer: Self-pay | Admitting: Interventional Cardiology

## 2017-01-31 ENCOUNTER — Ambulatory Visit: Payer: PPO | Admitting: Interventional Cardiology

## 2017-01-31 VITALS — BP 112/76 | HR 70 | Ht 73.0 in | Wt 233.0 lb

## 2017-01-31 DIAGNOSIS — E782 Mixed hyperlipidemia: Secondary | ICD-10-CM

## 2017-01-31 DIAGNOSIS — I4891 Unspecified atrial fibrillation: Secondary | ICD-10-CM

## 2017-01-31 DIAGNOSIS — R0789 Other chest pain: Secondary | ICD-10-CM | POA: Diagnosis not present

## 2017-01-31 NOTE — Patient Instructions (Addendum)
Medication Instructions:  Your physician recommends that you continue on your current medications as directed. Please refer to the Current Medication list given to you today.   Labwork: None ordered  Testing/Procedures: Your physician has requested that you have an exercise tolerance test. For further information please visit [email or web address]. Please also follow instruction sheet, as given.  --Per Dr. Irish Lack, you are to take all of your medicines for your exercise treadmill test--  Follow-Up: Your physician recommends that you schedule a follow-up appointment in: 3 months with Dr. Irish Lack   Any Other Special Instructions Will Be Listed Below (If Applicable).   Exercise Stress Electrocardiogram An exercise stress electrocardiogram is a test to check how blood flows to your heart. It is done to find areas of poor blood flow. You will need to walk on a treadmill for this test. The electrocardiogram will record your heartbeat when you are at rest and when you are exercising. What happens before the procedure?  Do not have drinks with caffeine or foods with caffeine for 24 hours before the test, or as told by your doctor. This includes coffee, tea (even decaf tea), sodas, chocolate, and cocoa.  Follow your doctor's instructions about eating and drinking before the test.  Ask your doctor what medicines you should or should not take before the test. Take your medicines with water unless told by your doctor not to.  If you use an inhaler, bring it with you to the test.  Bring a snack to eat after the test.  Do not  smoke for 4 hours before the test.  Do not put lotions, powders, creams, or oils on your chest before the test.  Wear comfortable shoes and clothing. What happens during the procedure?  You will have patches put on your chest. Small areas of your chest may need to be shaved. Wires will be connected to the patches.  Your heart rate will be watched while you are  resting and while you are exercising.  You will walk on the treadmill. The treadmill will slowly get faster to raise your heart rate.  The test will take about 1-2 hours. What happens after the procedure?  Your heart rate and blood pressure will be watched after the test.  You may return to your normal diet, activities, and medicines or as told by your doctor. This information is not intended to replace advice given to you by your health care provider. Make sure you discuss any questions you have with your health care provider. Document Released: 06/30/2007 Document Revised: 09/10/2015 Document Reviewed: 09/18/2012 Elsevier Interactive Patient Education  [employer].    If you need a refill on your cardiac medications before your next appointment, please call your pharmacy.

## 2017-01-31 NOTE — Progress Notes (Signed)
Cardiology Office Note   Date:  01/31/2017   ID:  Gary [name], DOB [birth year], MRN [id number]  PCP:  [name], MD    No chief complaint on file.  AFib  Wt Readings from Last 3 Encounters:  09/13/16 225 lb (102.1 kg)  08/30/16 225 lb (102.1 kg)  08/17/16 230 lb (104.3 kg)       History of Present Illness: Gary [name] is a 67 y.o. male  Who  has a h/o afib and atrial flutter.  He had a nonischemic (tachycardiac mediated) cardiomyopathy in 2005.  He underwent atrial flutter ablation by Dr Lovena Le in 2007.  His EF recovered and he did well.  He developed atrial fibrillation and required cardioversion.  He has been on flecainide for several years and has not had any arrhythmias to his knowledge.  About 4 weeks ago, he was moving his cousins and was quite stressed out.  He had also consumed more than his usual ETOH  (typically 3-4 drinks per night).  He developed afib.  He reports irregular pulse, fatigue and decreased exercise tolerance.  He was evaluated by Dr Irish Lack and placed on eliquis.  He was planned for TEE guided cardioversion 06/23/16 however the TEE probe could not be passed despite multiple attempts.  He eventually underwent cardioversion in June 2018.  He was seen in the atrial fibrillation clinic and flecainide, diltiazem and Eliquis were continued (note reviewed).  He had a negative treadmill test in the past as well.    Last week, he had a chest pain while in the grocery store.  He tok some TUMS with mild relief, but not complete.  He went home and took Maalox and had full relief.  His BP was increased.  He is ere for further f/u.  Prior to this, he had a persistent cough.  He took prednisone along with an antibiotic.  THe cough was resolveing when he had the chest pain.   Denies : Dizziness. Leg edema. Nitroglycerin use. Orthopnea. Paroxysmal nocturnal dyspnea. Shortness of breath. Syncope.   No further chest pain since the grocery store episode.  In the past  few days, he has been back on the bike and felt well.  He notes occasional skipped beats.    Past Medical History:  Diagnosis Date  . Atrial flutter ([place]) 2007   s/p CTI ablation by Dr Lovena Le  . Back pain   . Hyperlipidemia   . Hypertension   . Insomnia   . Obesity   . Persistent atrial fibrillation [facility])     Past Surgical History:  Procedure Laterality Date  . atrial flutter ablation  06/10/2005   CTI ablation by Dr Lovena Le for atrial flutter  . CARDIOVERSION N/A 07/20/2016   Procedure: CARDIOVERSION;  Surgeon: Minus Breeding, MD;  Location: [facility] ENDOSCOPY;  Service: Cardiovascular;  Laterality: N/A;  . NO PAST SURGERIES       Current Outpatient Medications  Medication Sig Dispense Refill  . amitriptyline (ELAVIL) 50 MG tablet TAKE 1-2 TABLETS BY MOUTH (50- 100 MG MAX) DAILY AT BEDTIME    . apixaban (ELIQUIS) 5 MG TABS tablet Take 1 tablet (5 mg total) by mouth 2 (two) times daily. 180 tablet 3  . atorvastatin (LIPITOR) 10 MG tablet Take 10 mg by mouth daily.    . colchicine 0.6 MG tablet Take 0.6 mg by mouth daily as needed (FOR GOUT).     . cyclobenzaprine (FLEXERIL) 10 MG tablet Take 1/2-1 tab qhs prn 30 tablet  0  . diltiazem (CARTIA XT) 180 MG 24 hr capsule Take 1 capsule (180 mg total) by mouth daily. 90 capsule 3  . flecainide (TAMBOCOR) 100 MG tablet Take 1 tablet (100 mg total) by mouth 2 (two) times daily. 180 tablet 1  . metoprolol succinate (TOPROL-XL) 25 MG 24 hr tablet Take 1 tablet (25 mg total) by mouth daily. 30 tablet 11  . Multiple Vitamin (MULTIVITAMIN) capsule Take 1 capsule by mouth daily.    . Omega-3 Fatty Acids (FISH OIL) 1200 MG CAPS Take 1,200 mg by mouth 2 (two) times daily.     . valACYclovir (VALTREX) 1000 MG tablet Take 1,000 mg by mouth daily as needed (FOR COLD SORES). As directed    . vitamin E (VITAMIN E) 400 UNIT capsule Take 400 Units by mouth daily.     Current Facility-Administered Medications  Medication Dose Route Frequency Provider Last  Rate Last Dose  . methylPREDNISolone acetate (DEPO-MEDROL) injection 40 mg  40 mg Intramuscular Once Thurman Coyer, DO        Allergies:   Penicillins    Social History:  The patient  reports that he has quit smoking. he has never used smokeless tobacco. He reports that he drinks alcohol. He reports that he does not use drugs.   Family History:  The patient's family history includes Heart attack in his father; Hypertension in his father and mother.    ROS:  Please see the history of present illness.   Otherwise, review of systems are positive for chest pain.   All other systems are reviewed and negative.    PHYSICAL EXAM: VS:  Ht 6\' 1"  (1.854 m)   BMI 29.69 kg/m  , BMI Body mass index is 29.69 kg/m. GEN: Well nourished, well developed, in no acute distress  HEENT: normal  Neck: no JVD, carotid bruits, or masses Cardiac: RRR; no murmurs, rubs, or gallops,no edema  Respiratory:  clear to auscultation bilaterally, normal work of breathing GI: soft, nontender, nondistended, + BS MS: no deformity or atrophy  Skin: warm and dry, no rash Neuro:  Strength and sensation are intact Psych: euthymic mood, full affect   EKG:   The ekg ordered today demonstrates NSR, no PVCs, no ST changes   Recent Labs: 07/05/2016: BUN 13; Creatinine, Ser 0.98; Hemoglobin 15.7; Platelets 184; Potassium 4.6; Sodium 134   Lipid Panel    Component Value Date/Time   CHOL 176 08/09/2010 0036   TRIG 234 (H) 08/09/2010 0036   HDL 46 08/09/2010 0036   CHOLHDL 3.8 08/09/2010 0036   VLDL 47 (H) 08/09/2010 0036   LDLCALC 83 08/09/2010 0036     Other studies Reviewed: Additional studies/ records that were reviewed today with results demonstrating: A1C 5.8 in 8/18.   ASSESSMENT AND PLAN:  1. AFib: Continue to flecainide and metoprolol.   2. Chest pain: Atypical.  Plan for ETT.  He has gone back to exercising without difficulty.  Given his age, will check for ischemia.  Last stress test was in 2012  and there were no ECG changes with exercise.  LDL 90 in August 2018. 3. Hyperlipidemia: continue atorvastatin.     Current medicines are reviewed at length with the patient today.  The patient concerns regarding his medicines were addressed.  The following changes have been made:  No change  Labs/ tests ordered today include:  No orders of the defined types were placed in this encounter.   Recommend 150 minutes/week of aerobic exercise Low fat, low  carb, high fiber diet recommended  Disposition:   FU for ETT   Signed, Larae Grooms, MD  01/31/2017 10:29 AM    [address], [place], [place]  [address] Phone: [phone]; Fax: [phone]

## 2017-02-04 ENCOUNTER — Ambulatory Visit (INDEPENDENT_AMBULATORY_CARE_PROVIDER_SITE_OTHER): Payer: PPO

## 2017-02-04 DIAGNOSIS — R0789 Other chest pain: Secondary | ICD-10-CM | POA: Diagnosis not present

## 2017-02-04 LAB — EXERCISE TOLERANCE TEST
CHL CUP RESTING HR STRESS: 71 {beats}/min
CSEPEDS: 11 s
CSEPHR: 88 %
Estimated workload: 6 METS
Exercise duration (min): 4 min
MPHR: 153 {beats}/min
Peak HR: 136 {beats}/min
RPE: 16

## 2017-02-23 DIAGNOSIS — H2513 Age-related nuclear cataract, bilateral: Secondary | ICD-10-CM | POA: Diagnosis not present

## 2017-03-22 DIAGNOSIS — H25811 Combined forms of age-related cataract, right eye: Secondary | ICD-10-CM | POA: Diagnosis not present

## 2017-03-22 DIAGNOSIS — H2511 Age-related nuclear cataract, right eye: Secondary | ICD-10-CM | POA: Diagnosis not present

## 2017-04-12 DIAGNOSIS — H25812 Combined forms of age-related cataract, left eye: Secondary | ICD-10-CM | POA: Diagnosis not present

## 2017-04-12 DIAGNOSIS — H2512 Age-related nuclear cataract, left eye: Secondary | ICD-10-CM | POA: Diagnosis not present

## 2017-05-02 ENCOUNTER — Other Ambulatory Visit: Payer: Self-pay | Admitting: Interventional Cardiology

## 2017-05-02 DIAGNOSIS — I480 Paroxysmal atrial fibrillation: Secondary | ICD-10-CM

## 2017-05-02 MED ORDER — METOPROLOL SUCCINATE ER 25 MG PO TB24: 25.0000 mg | ORAL_TABLET | Freq: Every day | ORAL | 2 refills | Status: DC

## 2017-05-03 NOTE — Progress Notes (Signed)
Cardiology Office Note   Date:  05/04/2017   ID:  LAKODA MCANANY, DOB [birth year], MRN [id number]  PCP:  [name], MD    No chief complaint on file.  Atrial fib  Wt Readings from Last 3 Encounters:  05/04/17 239 lb (108.4 kg)  01/31/17 233 lb (105.7 kg)  09/13/16 225 lb (102.1 kg)       History of Present Illness: Gary [name] is a 67 y.o. male  Who has a h/o afib and atrial flutter. He had a nonischemic (tachycardiac mediated) cardiomyopathy in 2005. He underwent atrial flutter ablation by Dr Lovena Le in 2007. His EF recovered and he did well. He developed atrial fibrillation and required cardioversion. He has been on flecainide for several years and has not had any arrhythmias to his knowledge. About 4 weeks ago, he was moving his cousins and was quite stressed out. He had also consumed more than his usual ETOH (typically 3-4 drinks per night). He developed afib. He reports irregular pulse, fatigue and decreased exercise tolerance. He was evaluated by Dr Irish Lack and placed on eliquis. He was planned for TEE guided cardioversion 06/23/16 however the TEE probe could not be passed despite multiple attempts.  He eventually underwent cardioversion in June 2018.  He was seen in the atrial fibrillation clinic and flecainide, diltiazem and Eliquis were continued.   He had some atypical chest pain a few months ago. In January 2019, he had a negative ETT.  Since the last visit, he had a cough that persisted.  He had a CXR and had atelectasis.  THis is better.  He has had cataract surgery.  Denies : Chest pain. Dizziness. Leg edema. Nitroglycerin use. Orthopnea. Palpitations. Paroxysmal nocturnal dyspnea. Shortness of breath. Syncope.   He has had some occasional left leg pain.  It is positional, when he lies in bed. Improves with moving the leg.  He will be seeing dermatology for a couple of spots on his face and neck.   No bleeding problems.  He is keeping alcohol  use moderate.   Past Medical History:  Diagnosis Date  . Atrial flutter ([place]) 2007   s/p CTI ablation by Dr Lovena Le  . Back pain   . Hyperlipidemia   . Hypertension   . Insomnia   . Obesity   . Persistent atrial fibrillation [facility])     Past Surgical History:  Procedure Laterality Date  . atrial flutter ablation  06/10/2005   CTI ablation by Dr Lovena Le for atrial flutter  . CARDIOVERSION N/A 07/20/2016   Procedure: CARDIOVERSION;  Surgeon: Minus Breeding, MD;  Location: [facility] ENDOSCOPY;  Service: Cardiovascular;  Laterality: N/A;  . NO PAST SURGERIES       Current Outpatient Medications  Medication Sig Dispense Refill  . amitriptyline (ELAVIL) 50 MG tablet TAKE 1-2 TABLETS BY MOUTH (50- 100 MG MAX) DAILY AT BEDTIME    . apixaban (ELIQUIS) 5 MG TABS tablet Take 1 tablet (5 mg total) by mouth 2 (two) times daily. 180 tablet 3  . aspirin EC 81 MG tablet Take 81 mg by mouth daily.    [name] atorvastatin (LIPITOR) 10 MG tablet Take 10 mg by mouth daily.    . colchicine 0.6 MG tablet Take 0.6 mg by mouth daily as needed (FOR GOUT).     . cyclobenzaprine (FLEXERIL) 10 MG tablet Take 10 mg by mouth as needed for muscle spasms.    [name] diltiazem (CARTIA XT) 180 MG 24 hr capsule Take 1 capsule (180  mg total) by mouth daily. 90 capsule 3  . DUREZOL 0.05 % EMUL Place 1 drop into the left eye daily.  0  . flecainide (TAMBOCOR) 100 MG tablet Take 1 tablet (100 mg total) by mouth 2 (two) times daily. 180 tablet 1  . metoprolol succinate (TOPROL-XL) 25 MG 24 hr tablet Take 1 tablet (25 mg total) by mouth daily. 90 tablet 2  . Multiple Vitamin (MULTIVITAMIN) capsule Take 1 capsule by mouth daily.    . Omega-3 Fatty Acids (FISH OIL) 1200 MG CAPS Take 1,200 mg by mouth 2 (two) times daily.     . valACYclovir (VALTREX) 1000 MG tablet Take 1,000 mg by mouth daily as needed (FOR COLD SORES). As directed    . vitamin E (VITAMIN E) 400 UNIT capsule Take 400 Units by mouth daily.     Current Facility-Administered  Medications  Medication Dose Route Frequency Provider Last Rate Last Dose  . methylPREDNISolone acetate (DEPO-MEDROL) injection 40 mg  40 mg Intramuscular Once Thurman Coyer, DO        Allergies:   Penicillins    Social History:  The patient  reports that he has quit smoking. He has never used smokeless tobacco. He reports that he drinks alcohol. He reports that he does not use drugs.   Family History:  The patient's family history includes Heart attack in his father; Hypertension in his father and mother.    ROS:  Please see the history of present illness.   Otherwise, review of systems are positive for leg pain.   All other systems are reviewed and negative.    PHYSICAL EXAM: VS:  BP 120/70   Pulse 66   Ht 6\' 1"  (1.854 m)   Wt 239 lb (108.4 kg)   SpO2 95%   BMI 31.53 kg/m  , BMI Body mass index is 31.53 kg/m. GEN: Well nourished, well developed, in no acute distress  HEENT: normal  Neck: no JVD, carotid bruits, or masses Cardiac: RRR; no murmurs, rubs, or gallops,no edema  Respiratory:  clear to auscultation bilaterally, normal work of breathing GI: soft, nontender, nondistended, + BS MS: no deformity or atrophy ; 2+ PT pulses bilaterally Skin: warm and dry, no rash Neuro:  Strength and sensation are intact Psych: euthymic mood, full affect     Recent Labs: 07/05/2016: BUN 13; Creatinine, Ser 0.98; Hemoglobin 15.7; Platelets 184; Potassium 4.6; Sodium 134   Lipid Panel    Component Value Date/Time   CHOL 176 08/09/2010 0036   TRIG 234 (H) 08/09/2010 0036   HDL 46 08/09/2010 0036   CHOLHDL 3.8 08/09/2010 0036   VLDL 47 (H) 08/09/2010 0036   LDLCALC 83 08/09/2010 0036     Other studies Reviewed: Additional studies/ records that were reviewed today with results demonstrating: .   ASSESSMENT AND PLAN:  1. AFib: The current medical regimen is effective;  continue present plan and medications.  In NSR.  Continue Eliquis for stroke prevention.  Stop aspirin.  Check labs for Eliquis. 2. Hyperlipidemia:  LDL 90 in 8/18.  COntinue atorvastatin.  3. Chest pain: resolved 4. Leg pain: left- not claudication. Palpable pulses.  No obvious venous abnormalities.   Current medicines are reviewed at length with the patient today.  The patient concerns regarding his medicines were addressed.  The following changes have been made:  Stop aspirin  Labs/ tests ordered today include: CBC nad Bmet No orders of the defined types were placed in this encounter.   Recommend 150 minutes/week  of aerobic exercise Low fat, low carb, high fiber diet recommended  Disposition:   FU in 6 months   Signed, Larae Grooms, MD  05/04/2017 10:54 AM    [facility] [address], [place], [place]  [address] Phone: [phone]; Fax: [phone]

## 2017-05-04 ENCOUNTER — Ambulatory Visit: Payer: PPO | Admitting: Interventional Cardiology

## 2017-05-04 ENCOUNTER — Encounter: Payer: Self-pay | Admitting: Interventional Cardiology

## 2017-05-04 VITALS — BP 120/70 | HR 66 | Ht 73.0 in | Wt 239.0 lb

## 2017-05-04 DIAGNOSIS — E782 Mixed hyperlipidemia: Secondary | ICD-10-CM

## 2017-05-04 DIAGNOSIS — M79605 Pain in left leg: Secondary | ICD-10-CM | POA: Diagnosis not present

## 2017-05-04 DIAGNOSIS — I480 Paroxysmal atrial fibrillation: Secondary | ICD-10-CM

## 2017-05-04 LAB — CBC
HEMATOCRIT: 47.4 % (ref 37.5–51.0)
Hemoglobin: 16.3 g/dL (ref 13.0–17.7)
MCH: 32.7 pg (ref 26.6–33.0)
MCHC: 34.4 g/dL (ref 31.5–35.7)
MCV: 95 fL (ref 79–97)
PLATELETS: 194 10*3/uL (ref 150–379)
RBC: 4.98 x10E6/uL (ref 4.14–5.80)
RDW: 14.1 % (ref 12.3–15.4)
WBC: 6.4 10*3/uL (ref 3.4–10.8)

## 2017-05-04 LAB — BASIC METABOLIC PANEL
BUN/Creatinine Ratio: 9 — ABNORMAL LOW (ref 10–24)
BUN: 10 mg/dL (ref 8–27)
CO2: 27 mmol/L (ref 20–29)
Calcium: 9.6 mg/dL (ref 8.6–10.2)
Chloride: 97 mmol/L (ref 96–106)
Creatinine, Ser: 1.08 mg/dL (ref 0.76–1.27)
GFR calc Af Amer: 81 mL/min/{1.73_m2} (ref >59)
GFR calc non Af Amer: 70 mL/min/{1.73_m2} (ref >59)
Glucose: 99 mg/dL (ref 65–99)
Potassium: 4.7 mmol/L (ref 3.5–5.2)
Sodium: 138 mmol/L (ref 134–144)

## 2017-05-04 NOTE — Patient Instructions (Signed)
Medication Instructions:  Your physician has recommended you make the following change in your medication:   STOP: Aspirin  Labwork: TODAY: CBC, BMET  Testing/Procedures: None ordered  Follow-Up: Your physician wants you to follow-up in: 6 months with Dr. Irish Lack. You will receive a reminder letter in the mail two months in advance. If you don't receive a letter, please call our office to schedule the follow-up appointment.   Any Other Special Instructions Will Be Listed Below (If Applicable).     If you need a refill on your cardiac medications before your next appointment, please call your pharmacy.

## 2017-05-09 ENCOUNTER — Other Ambulatory Visit: Payer: Self-pay | Admitting: Interventional Cardiology

## 2017-05-09 DIAGNOSIS — I480 Paroxysmal atrial fibrillation: Secondary | ICD-10-CM

## 2017-05-09 MED ORDER — DILTIAZEM HCL ER COATED BEADS 180 MG PO CP24: 180.0000 mg | ORAL_CAPSULE | Freq: Every day | ORAL | 3 refills | Status: DC

## 2017-06-06 DIAGNOSIS — L821 Other seborrheic keratosis: Secondary | ICD-10-CM | POA: Diagnosis not present

## 2017-06-06 DIAGNOSIS — L820 Inflamed seborrheic keratosis: Secondary | ICD-10-CM | POA: Diagnosis not present

## 2017-06-06 DIAGNOSIS — D225 Melanocytic nevi of trunk: Secondary | ICD-10-CM | POA: Diagnosis not present

## 2017-06-06 DIAGNOSIS — D1801 Hemangioma of skin and subcutaneous tissue: Secondary | ICD-10-CM | POA: Diagnosis not present

## 2017-06-06 DIAGNOSIS — C4441 Basal cell carcinoma of skin of scalp and neck: Secondary | ICD-10-CM | POA: Diagnosis not present

## 2017-06-07 DIAGNOSIS — H1851 Endothelial corneal dystrophy: Secondary | ICD-10-CM | POA: Diagnosis not present

## 2017-07-04 ENCOUNTER — Other Ambulatory Visit: Payer: Self-pay | Admitting: *Deleted

## 2017-07-04 DIAGNOSIS — Z85828 Personal history of other malignant neoplasm of skin: Secondary | ICD-10-CM | POA: Diagnosis not present

## 2017-07-04 DIAGNOSIS — Z08 Encounter for follow-up examination after completed treatment for malignant neoplasm: Secondary | ICD-10-CM | POA: Diagnosis not present

## 2017-07-04 MED ORDER — APIXABAN 5 MG PO TABS: 5.0000 mg | ORAL_TABLET | Freq: Two times a day (BID) | ORAL | 1 refills | Status: DC

## 2017-07-04 NOTE — Telephone Encounter (Signed)
Age 67 years Wt 108.4kg 05/04/2017 Saw Dr Irish Lack 05/04/2017 05/04/2017 SrCr 1.08 Hgb 16.3  Hematocrit  47.4 Refill done for Eliquis 5mg  q 12 hours as requested

## 2017-08-24 ENCOUNTER — Ambulatory Visit: Payer: PPO | Admitting: Family Medicine

## 2017-08-24 ENCOUNTER — Ambulatory Visit
Admission: RE | Admit: 2017-08-24 | Discharge: 2017-08-24 | Disposition: A | Discharge status: Home / Self Care | Payer: PPO | Source: Ambulatory Visit | Attending: Family Medicine | Admitting: Family Medicine

## 2017-08-24 VITALS — BP 112/78 | Ht 73.0 in | Wt 230.0 lb

## 2017-08-24 DIAGNOSIS — M25552 Pain in left hip: Secondary | ICD-10-CM | POA: Diagnosis not present

## 2017-08-24 DIAGNOSIS — M160 Bilateral primary osteoarthritis of hip: Secondary | ICD-10-CM | POA: Diagnosis not present

## 2017-08-24 DIAGNOSIS — M25511 Pain in right shoulder: Secondary | ICD-10-CM

## 2017-08-24 NOTE — Patient Instructions (Signed)
You have rotator cuff impingement Try to avoid painful activities (overhead activities, lifting with extended arm) as much as possible. Aleve 2 tabs twice a day with food OR ibuprofen 3 tabs three times a day with food for pain and inflammation as needed. Can take tylenol in addition to this. Subacromial injection may be beneficial to help with pain and to decrease inflammation. Continue physical therapy with transition to home exercise program. Do home exercise program with theraband and scapular stabilization exercises daily 3 sets of 10 once a day. If not improving at follow-up we will consider further imaging, injection, and/or nitro patches. Follow up with me in 6 weeks.  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 These are the different medications you can take for this: Tylenol 500mg  1-2 tabs three times a day for pain. Capsaicin, aspercreme, or biofreeze topically up to four times a day may also help with pain. Some supplements that may help for arthritis: Boswellia extract, curcumin, pycnogenol Aleve or ibuprofen as noted above Cortisone injections are an option. It's important that you continue to stay active. Hip side raises, standing hip rotations 3 sets of 10 once a day (add ankle weight if these become too easy). Continue physical therapy to strengthen muscles around the joint that hurts to take pressure off of the joint itself. Shoe inserts with good arch support may be helpful. Heat or ice 15 minutes at a time 3-4 times a day as needed to help with pain. Water aerobics and cycling with low resistance are the best two types of exercise for arthritis though any exercise is ok as long as it doesn't worsen the pain.

## 2017-08-25 ENCOUNTER — Encounter: Payer: Self-pay | Admitting: Family Medicine

## 2017-08-25 NOTE — Progress Notes (Signed)
PCP: [name], MD  Subjective:   HPI: Patient is a 67 y.o. male here for left hip pain.  Patient comes in today primarily with left hip pain. He denies any acute injury but states over the past 3 months he has had migratory type of left hip pain. At times he feels anteriorly sometimes posteriorly, gets cramping down the left lateral lower leg is how he perceived this initially. Pain is worse at night. No numbness. Pain is 0 out of 10 at rest but gets up to a 5 out of 10 level and can be sharp. He has been working with [place] at [facility]. At times he will be limping and it can grab him. He also reports her right shoulder pain gets worse with reaching forward and out to the side that is a 0 out of 10 level but gets up to 9 out of 10 and sharp. No skin changes or numbness. Recalls a remote low back injury that he completely improved from. No bowel or bladder dysfunction.  Past Medical History:  Diagnosis Date  . Atrial flutter ([place]) 2007   s/p CTI ablation by Dr Lovena Le  . Back pain   . Hyperlipidemia   . Hypertension   . Insomnia   . Obesity   . Persistent atrial fibrillation ([place])     Current Outpatient Medications on File Prior to Visit  Medication Sig Dispense Refill  . amitriptyline (ELAVIL) 50 MG tablet TAKE 1-2 TABLETS BY MOUTH (50- 100 MG MAX) DAILY AT BEDTIME    . apixaban (ELIQUIS) 5 MG TABS tablet Take 1 tablet (5 mg total) by mouth 2 (two) times daily. 180 tablet 1  . atorvastatin (LIPITOR) 10 MG tablet Take 10 mg by mouth daily.    . colchicine 0.6 MG tablet Take 0.6 mg by mouth daily as needed (FOR GOUT).     . cyclobenzaprine (FLEXERIL) 10 MG tablet Take 10 mg by mouth as needed for muscle spasms.    [name] diltiazem (CARTIA XT) 180 MG 24 hr capsule Take 1 capsule (180 mg total) by mouth daily. 90 capsule 3  . flecainide (TAMBOCOR) 100 MG tablet Take 1 tablet (100 mg total) by mouth 2 (two) times daily. 180 tablet 1  . metoprolol succinate (TOPROL-XL) 25 MG 24  hr tablet Take 1 tablet (25 mg total) by mouth daily. 90 tablet 2  . Multiple Vitamin (MULTIVITAMIN) capsule Take 1 capsule by mouth daily.    . Omega-3 Fatty Acids (FISH OIL) 1200 MG CAPS Take 1,200 mg by mouth 2 (two) times daily.     . valACYclovir (VALTREX) 1000 MG tablet Take 1,000 mg by mouth daily as needed (FOR COLD SORES). As directed    . vitamin E (VITAMIN E) 400 UNIT capsule Take 400 Units by mouth daily.    . DUREZOL 0.05 % EMUL Place 1 drop into the left eye daily.  0   Current Facility-Administered Medications on File Prior to Visit  Medication Dose Route Frequency Provider Last Rate Last Dose  . methylPREDNISolone acetate (DEPO-MEDROL) injection 40 mg  40 mg Intramuscular Once Thurman Coyer, DO        Past Surgical History:  Procedure Laterality Date  . atrial flutter ablation  06/10/2005   CTI ablation by Dr Lovena Le for atrial flutter  . CARDIOVERSION N/A 07/20/2016   Procedure: CARDIOVERSION;  Surgeon: Minus Breeding, MD;  Location: [facility];  Service: Cardiovascular;  Laterality: N/A;  . NO PAST SURGERIES      Allergies  Allergen Reactions  . Penicillins     rash    Social History   Socioeconomic History  . Marital status: Married    Spouse name: Not on file  . Number of children: Not on file  . Years of education: Not on file  . Highest education level: Not on file  Occupational History  . Not on file  Social Needs  . Financial resource strain: Not on file  . Food insecurity:    Worry: Not on file    Inability: Not on file  . Transportation needs:    Medical: Not on file    Non-medical: Not on file  Tobacco Use  . Smoking status: Former Research scientist (life sciences)  . Smokeless tobacco: Never Used  Substance and Sexual Activity  . Alcohol use: Yes    Comment: 3-4 drinks per day  . Drug use: No  . Sexual activity: Yes  Lifestyle  . Physical activity:    Days per week: Not on file    Minutes per session: Not on file  . Stress: Not on file  Relationships   . Social connections:    Talks on phone: Not on file    Gets together: Not on file    Attends religious service: Not on file    Active member of club or organization: Not on file    Attends meetings of clubs or organizations: Not on file    Relationship status: Not on file  . Intimate partner violence:    Fear of current or ex partner: Not on file    Emotionally abused: Not on file    Physically abused: Not on file    Forced sexual activity: Not on file  Other Topics Concern  . Not on file  Social History Narrative   Lives in [place]   Retired from [employer]    Family History  Problem Relation Age of Onset  . Heart attack Father   . Hypertension Father   . Hypertension Mother     BP 112/78   Ht 6\' 1"  (1.854 m)   Wt 230 lb (104.3 kg)   BMI 30.34 kg/m   Review of Systems: See HPI above.     Objective:  Physical Exam:  Gen: NAD, comfortable in exam room  Right shoulder: No swelling, ecchymoses.  No gross deformity. No TTP. FROM. Positive Hawkins, Neers. Negative Yergasons. Strength 5/5 with empty can and resisted internal/external rotation.  Pain empty can. Negative apprehension. NV intact distally.  Left shoulder: No swelling, ecchymoses.  No gross deformity. No TTP. FROM. Strength 5/5 with empty can and resisted internal/external rotation. NV intact distally.  Back: No gross deformity, scoliosis. No TTP .  No midline or bony TTP. FROM. Strength LEs 5/5 all muscle groups.   2+ MSRs in patellar and achilles tendons, equal bilaterally. Negative SLRs. Sensation intact to light touch bilaterally.  Left hip: No deformity. Minimal internal rotation, painful.  Otherwise FROM with 5/5 strength. No tenderness to palpation. NVI distally. Positive logroll Negative fabers and piriformis stretches.   Assessment & Plan:  1. Right shoulder pain -secondary to rotator cuff impingement.  Shown home exercises to do daily.  Aleve or ibuprofen if needed.  Avoid  overhead motions and reaching activities as much as possible.  Will consider further imaging, injection, nitro patches if not improving.  Follow-up in 6 weeks.  2. Left hip pain -we will obtain radiographs of his hips but current exam suggest arthritis as the cause of his pain.  We discussed  Tylenol, topical medications, supplements, Aleve or ibuprofen.  He can consider cortisone injections.  Shown home exercises to do daily and he can continue with physical therapy.  Heat or ice if needed.  Follow-up in 6 weeks.

## 2017-08-26 NOTE — Addendum Note (Signed)
Addended by: Cyd Silence on: 08/26/2017 10:16 AM   Modules accepted: Orders

## 2017-09-01 DIAGNOSIS — M25552 Pain in left hip: Secondary | ICD-10-CM | POA: Diagnosis not present

## 2017-09-01 DIAGNOSIS — M25511 Pain in right shoulder: Secondary | ICD-10-CM | POA: Diagnosis not present

## 2017-09-05 DIAGNOSIS — M25511 Pain in right shoulder: Secondary | ICD-10-CM | POA: Diagnosis not present

## 2017-09-05 DIAGNOSIS — M25552 Pain in left hip: Secondary | ICD-10-CM | POA: Diagnosis not present

## 2017-09-08 DIAGNOSIS — M25511 Pain in right shoulder: Secondary | ICD-10-CM | POA: Diagnosis not present

## 2017-09-08 DIAGNOSIS — M25552 Pain in left hip: Secondary | ICD-10-CM | POA: Diagnosis not present

## 2017-09-12 DIAGNOSIS — M25511 Pain in right shoulder: Secondary | ICD-10-CM | POA: Diagnosis not present

## 2017-09-12 DIAGNOSIS — M25552 Pain in left hip: Secondary | ICD-10-CM | POA: Diagnosis not present

## 2017-09-14 DIAGNOSIS — M25511 Pain in right shoulder: Secondary | ICD-10-CM | POA: Diagnosis not present

## 2017-09-14 DIAGNOSIS — M25552 Pain in left hip: Secondary | ICD-10-CM | POA: Diagnosis not present

## 2017-09-15 DIAGNOSIS — M25552 Pain in left hip: Secondary | ICD-10-CM | POA: Diagnosis not present

## 2017-09-15 DIAGNOSIS — M25511 Pain in right shoulder: Secondary | ICD-10-CM | POA: Diagnosis not present

## 2017-09-19 DIAGNOSIS — M25511 Pain in right shoulder: Secondary | ICD-10-CM | POA: Diagnosis not present

## 2017-09-19 DIAGNOSIS — M25552 Pain in left hip: Secondary | ICD-10-CM | POA: Diagnosis not present

## 2017-09-21 DIAGNOSIS — M25511 Pain in right shoulder: Secondary | ICD-10-CM | POA: Diagnosis not present

## 2017-09-21 DIAGNOSIS — M25552 Pain in left hip: Secondary | ICD-10-CM | POA: Diagnosis not present

## 2017-09-22 DIAGNOSIS — M25511 Pain in right shoulder: Secondary | ICD-10-CM | POA: Diagnosis not present

## 2017-09-22 DIAGNOSIS — M25552 Pain in left hip: Secondary | ICD-10-CM | POA: Diagnosis not present

## 2017-09-28 ENCOUNTER — Ambulatory Visit: Payer: PPO | Admitting: Family Medicine

## 2017-09-28 VITALS — BP 120/74 | Ht 73.0 in | Wt 230.0 lb

## 2017-09-28 DIAGNOSIS — M25552 Pain in left hip: Secondary | ICD-10-CM | POA: Diagnosis not present

## 2017-09-28 DIAGNOSIS — M25511 Pain in right shoulder: Secondary | ICD-10-CM | POA: Diagnosis not present

## 2017-09-28 DIAGNOSIS — M545 Low back pain, unspecified: Secondary | ICD-10-CM

## 2017-09-28 NOTE — Progress Notes (Addendum)
PCP: [name], MD  Subjective:   HPI: Patient is a 67 y.o. male here for left hip pain.  08/24/2017: Patient comes in today primarily with left hip pain. He denies any acute injury but states over the past 3 months he has had migratory type of left hip pain. At times he feels anteriorly sometimes posteriorly, gets cramping down the left lateral lower leg is how he perceived this initially. Pain is worse at night. No numbness. Pain is 0 out of 10 at rest but gets up to a 5 out of 10 level and can be sharp. He has been working with [place] at [facility]. At times he will be limping and it can grab him. He also reports her right shoulder pain gets worse with reaching forward and out to the side that is a 0 out of 10 level but gets up to 9 out of 10 and sharp. No skin changes or numbness. Recalls a remote low back injury that he completely improved from. No bowel or bladder dysfunction.  09/28/2017:  Presents for follow up of left hip pain. Patient states that pain has worsened since last office visit.  Pain continues to be migratory with extension into anterior/posterior hip with radiation into anterior and lateral thigh as well as lateral calf.  Pain is present both at rest and with activity. Pain hinders patient's ability to fall asleep. Ibuprofen 600mg  has helped with pain, however, he has only taken this dosage once yesterday. He reports being compliant with exercise regimen and PT. He is discouraged with pain as it has escalated. He denies any fevers/chills, overt back pain, weakness in LE, loss of bowel or bladder control, or saddle paresthesias.  Of note, patient was previously seen for shoulder pain. Shoulder pain has improved.  Past Medical History:  Diagnosis Date  . Atrial flutter ([place]) 2007   s/p CTI ablation by Dr Lovena Le  . Back pain   . Hyperlipidemia   . Hypertension   . Insomnia   . Obesity   . Persistent atrial fibrillation ([place])     Current Outpatient Medications  on File Prior to Visit  Medication Sig Dispense Refill  . amitriptyline (ELAVIL) 50 MG tablet TAKE 1-2 TABLETS BY MOUTH (50- 100 MG MAX) DAILY AT BEDTIME    . apixaban (ELIQUIS) 5 MG TABS tablet Take 1 tablet (5 mg total) by mouth 2 (two) times daily. 180 tablet 1  . atorvastatin (LIPITOR) 10 MG tablet Take 10 mg by mouth daily.    . colchicine 0.6 MG tablet Take 0.6 mg by mouth daily as needed (FOR GOUT).     . cyclobenzaprine (FLEXERIL) 10 MG tablet Take 10 mg by mouth as needed for muscle spasms.    [name] diltiazem (CARTIA XT) 180 MG 24 hr capsule Take 1 capsule (180 mg total) by mouth daily. 90 capsule 3  . flecainide (TAMBOCOR) 100 MG tablet Take 1 tablet (100 mg total) by mouth 2 (two) times daily. 180 tablet 1  . metoprolol succinate (TOPROL-XL) 25 MG 24 hr tablet Take 1 tablet (25 mg total) by mouth daily. 90 tablet 2  . Multiple Vitamin (MULTIVITAMIN) capsule Take 1 capsule by mouth daily.    . Omega-3 Fatty Acids (FISH OIL) 1200 MG CAPS Take 1,200 mg by mouth 2 (two) times daily.     . valACYclovir (VALTREX) 1000 MG tablet Take 1,000 mg by mouth daily as needed (FOR COLD SORES). As directed    . vitamin E (VITAMIN E) 400 UNIT  capsule Take 400 Units by mouth daily.    . DUREZOL 0.05 % EMUL Place 1 drop into the left eye daily.  0   Current Facility-Administered Medications on File Prior to Visit  Medication Dose Route Frequency Provider Last Rate Last Dose  . methylPREDNISolone acetate (DEPO-MEDROL) injection 40 mg  40 mg Intramuscular Once Thurman Coyer, DO        Past Surgical History:  Procedure Laterality Date  . atrial flutter ablation  06/10/2005   CTI ablation by Dr Lovena Le for atrial flutter  . CARDIOVERSION N/A 07/20/2016   Procedure: CARDIOVERSION;  Surgeon: Minus Breeding, MD;  Location: [facility];  Service: Cardiovascular;  Laterality: N/A;  . NO PAST SURGERIES      Allergies  Allergen Reactions  . Penicillins     rash    Social History   Socioeconomic  History  . Marital status: Married    Spouse name: Not on file  . Number of children: Not on file  . Years of education: Not on file  . Highest education level: Not on file  Occupational History  . Not on file  Social Needs  . Financial resource strain: Not on file  . Food insecurity:    Worry: Not on file    Inability: Not on file  . Transportation needs:    Medical: Not on file    Non-medical: Not on file  Tobacco Use  . Smoking status: Former Research scientist (life sciences)  . Smokeless tobacco: Never Used  Substance and Sexual Activity  . Alcohol use: Yes    Comment: 3-4 drinks per day  . Drug use: No  . Sexual activity: Yes  Lifestyle  . Physical activity:    Days per week: Not on file    Minutes per session: Not on file  . Stress: Not on file  Relationships  . Social connections:    Talks on phone: Not on file    Gets together: Not on file    Attends religious service: Not on file    Active member of club or organization: Not on file    Attends meetings of clubs or organizations: Not on file    Relationship status: Not on file  . Intimate partner violence:    Fear of current or ex partner: Not on file    Emotionally abused: Not on file    Physically abused: Not on file    Forced sexual activity: Not on file  Other Topics Concern  . Not on file  Social History Narrative   Lives in [place]   Retired from [employer]    Family History  Problem Relation Age of Onset  . Heart attack Father   . Hypertension Father   . Hypertension Mother     BP 120/74   Ht 6\' 1"  (1.854 m)   Wt 230 lb (104.3 kg)   BMI 30.34 kg/m   Review of Systems: See HPI above.     Objective:  Physical Exam:  Gen: alert, cooperative. Requires to stand at times due to hip pain.  Pulm: no conversational dyspnea. No respiratory distress.  Back: No gross deformity, scoliosis. No TTP .  No midline or bony TTP. FROM. Strength LEs 5/5 all muscle groups. Sensation intact to light touch bilaterally.  Left  hip: No deformity. Minimal internal rotation with only mild discomfort.  Otherwise FROM with 5/5 strength. No tenderness to palpation. NVI distally. Negative logroll equivocal FABER Negative fadir Sensation intact to light touch bilaterally.  Right hip: No  deformity. FROM with 5/5 strength. No tenderness to palpation. NVI distally. Negative log roll Sensation intact to light touch bilaterally   Assessment & Plan:  1. Right shoulder pain -secondary to rotator cuff impingement.   Improving with PT. Follow-up as needed.   2. Left hip pain  Ddx: Lumbar radiculopathy vs osteoarthritis of left hip His distribution of pain into lateral calf and severity with worsening of pain, not responsive to physical therapy suggests lumbar radiculopathy as primary source of pain though has left hip arthritis as well.  Advised we go ahead with MRI of lumbar spine to further assess.  Ibuprofen 600mg  three times a day - declined prednisone for now.  Will hold PT for now also.  Addendum 11/8:  Spoke with patient - not much improvement after second ESI for spinal stenosis.  He will start flexion based program in PT and sent in short course of tramadol to take if needed.  He will update [place] early next week.  Stop NSAIDs - was taking high dose ibuprofen which helped.  Discussed topical medications (capsaicin, aspercreme) and salon pas patches, tylenol.

## 2017-09-28 NOTE — Patient Instructions (Signed)
We will go ahead with an MRI of your lumbar spine to assess for lumbar radiculopathy. Hold the physical therapy for now. Take ibuprofen 600mg  three times a day with food. Next steps will depend on the MRI results.

## 2017-09-29 ENCOUNTER — Encounter: Payer: Self-pay | Admitting: Family Medicine

## 2017-09-29 ENCOUNTER — Telehealth: Payer: Self-pay | Admitting: Family Medicine

## 2017-09-29 MED ORDER — PREDNISONE 10 MG PO TABS: ORAL_TABLET | ORAL | 0 refills | Status: DC

## 2017-09-29 NOTE — Telephone Encounter (Signed)
Ok, sent this in for him.  Thanks!

## 2017-09-29 NOTE — Telephone Encounter (Signed)
Patient states he has not found relief with Ibuprofen and would like to start the steroid that was discussed at office visit yesterday.  Pharmacy: [facility] on [address]

## 2017-10-01 ENCOUNTER — Ambulatory Visit
Admission: RE | Admit: 2017-10-01 | Discharge: 2017-10-01 | Disposition: A | Discharge status: Home / Self Care | Payer: PPO | Source: Ambulatory Visit | Attending: Family Medicine | Admitting: Family Medicine

## 2017-10-01 DIAGNOSIS — M545 Low back pain, unspecified: Secondary | ICD-10-CM

## 2017-10-01 DIAGNOSIS — M48061 Spinal stenosis, lumbar region without neurogenic claudication: Secondary | ICD-10-CM | POA: Diagnosis not present

## 2017-10-03 ENCOUNTER — Other Ambulatory Visit: Payer: Self-pay | Admitting: *Deleted

## 2017-10-03 MED ORDER — FLECAINIDE ACETATE 100 MG PO TABS: 100.0000 mg | ORAL_TABLET | Freq: Two times a day (BID) | ORAL | 1 refills | Status: DC

## 2017-10-04 ENCOUNTER — Encounter: Payer: Self-pay | Admitting: Family Medicine

## 2017-10-05 ENCOUNTER — Telehealth: Payer: Self-pay

## 2017-10-05 ENCOUNTER — Other Ambulatory Visit: Payer: Self-pay | Admitting: Family Medicine

## 2017-10-05 DIAGNOSIS — M48061 Spinal stenosis, lumbar region without neurogenic claudication: Secondary | ICD-10-CM

## 2017-10-05 NOTE — Telephone Encounter (Signed)
   [name] Pre-operative Risk Assessment    Request for surgical clearance:  1. What type of surgery is being performed?  Lumbar ESI  2. When is this surgery scheduled?  TBD   3. What type of clearance is required (medical clearance vs. Pharmacy clearance to hold med vs. Both)?  BOTH  4. Are there any medications that need to be held prior to surgery and how long? Eliquis 2 days   5. Practice name and name of physician performing surgery?  [facility]   6. What is your office phone number [phone]    7.   What is your office fax number [phone]  8.   Anesthesia type (None, local, MAC, general) ?     Gary [name]  [facility] [name] 10/05/2017, 4:31 PM  _________________________________________________________________   (provider comments below)

## 2017-10-06 NOTE — Telephone Encounter (Addendum)
   Primary Cardiologist: Larae Grooms, MD  Chart reviewed as part of pre-operative protocol coverage. Patient was contacted 10/07/2017 in reference to pre-operative risk assessment for pending surgery as outlined below.  MERIC JOYE was last seen on 05/04/2017 by Dr. Irish Lack.  Since that day, DAVION FLANNERY has done well.  His activity is not limited by chest pain or shortness of breath.  He does not think he has had any more atrial fibrillation.  His activity limitations come from his back and leg pain which the spinal injection should help with.  Therefore, based on ACC/AHA guidelines, the patient would be at acceptable risk for the planned procedure without further cardiovascular testing.   I will route this recommendation to the requesting party via Epic fax function and remove from pre-op pool.  Please call with questions.  Rosaria Ferries, PA-C 10/07/2017, 10:10 AM

## 2017-10-07 NOTE — Telephone Encounter (Signed)
Patient with diagnosis of AFIB on APIXABAN for anticoagulation.    Procedure: LUMBAR ESI Date of procedure: TBD  CHADS2-VASc score of  2 (HTN, AGE)  CrCl > 30 ML/MIN Platelet count 194 (05/04/17)  Per office protocol, patient can hold ELIQUIS for 3days prior to procedure.

## 2017-10-19 ENCOUNTER — Ambulatory Visit
Admission: RE | Admit: 2017-10-19 | Discharge: 2017-10-19 | Disposition: A | Discharge status: Home / Self Care | Payer: PPO | Source: Ambulatory Visit | Attending: Family Medicine | Admitting: Family Medicine

## 2017-10-19 DIAGNOSIS — M48061 Spinal stenosis, lumbar region without neurogenic claudication: Secondary | ICD-10-CM

## 2017-10-19 MED ORDER — IOPAMIDOL (ISOVUE-M 200) INJECTION 41%
1.0000 mL | Freq: Once | INTRAMUSCULAR | Status: AC
  Administered 2017-10-19: 1 mL via EPIDURAL

## 2017-10-19 MED ORDER — METHYLPREDNISOLONE ACETATE 40 MG/ML INJ SUSP (RADIOLOG
120.0000 mg | Freq: Once | INTRAMUSCULAR | Status: AC
  Administered 2017-10-19: 120 mg via EPIDURAL

## 2017-10-19 NOTE — Discharge Instructions (Signed)

## 2017-10-20 ENCOUNTER — Other Ambulatory Visit: Payer: PPO

## 2017-10-25 DIAGNOSIS — H1013 Acute atopic conjunctivitis, bilateral: Secondary | ICD-10-CM | POA: Diagnosis not present

## 2017-11-04 ENCOUNTER — Encounter: Payer: Self-pay | Admitting: Family Medicine

## 2017-11-07 ENCOUNTER — Other Ambulatory Visit: Payer: Self-pay

## 2017-11-07 DIAGNOSIS — M545 Low back pain, unspecified: Secondary | ICD-10-CM

## 2017-11-07 DIAGNOSIS — Z961 Presence of intraocular lens: Secondary | ICD-10-CM | POA: Diagnosis not present

## 2017-11-07 NOTE — Progress Notes (Signed)
Order placed for repeat ESI. They will call pt to schedule appt.

## 2017-11-07 NOTE — Telephone Encounter (Signed)
Please set Joe up for a repeat ESI on left at L5-S1 for spinal stenosis.  Thanks!

## 2017-11-08 ENCOUNTER — Other Ambulatory Visit: Payer: Self-pay | Admitting: Family Medicine

## 2017-11-08 DIAGNOSIS — M545 Low back pain, unspecified: Secondary | ICD-10-CM

## 2017-11-17 ENCOUNTER — Ambulatory Visit
Admission: RE | Admit: 2017-11-17 | Discharge: 2017-11-17 | Disposition: A | Discharge status: Home / Self Care | Payer: PPO | Source: Ambulatory Visit | Attending: Family Medicine | Admitting: Family Medicine

## 2017-11-17 DIAGNOSIS — M545 Low back pain, unspecified: Secondary | ICD-10-CM

## 2017-11-17 DIAGNOSIS — M47817 Spondylosis without myelopathy or radiculopathy, lumbosacral region: Secondary | ICD-10-CM | POA: Diagnosis not present

## 2017-11-17 MED ORDER — IOPAMIDOL (ISOVUE-M 200) INJECTION 41%
1.0000 mL | Freq: Once | INTRAMUSCULAR | Status: AC
  Administered 2017-11-17: 1 mL via EPIDURAL

## 2017-11-17 MED ORDER — METHYLPREDNISOLONE ACETATE 40 MG/ML INJ SUSP (RADIOLOG
120.0000 mg | Freq: Once | INTRAMUSCULAR | Status: AC
  Administered 2017-11-17: 120 mg via EPIDURAL

## 2017-11-17 NOTE — Discharge Instructions (Signed)

## 2017-11-21 ENCOUNTER — Telehealth: Payer: Self-pay | Admitting: Interventional Cardiology

## 2017-11-21 ENCOUNTER — Encounter: Payer: Self-pay | Admitting: Physician Assistant

## 2017-11-21 NOTE — Progress Notes (Signed)
Cardiology Office Note    Date:  11/22/2017  ID:  Gary [name], DOB March 15, 1950, MRN [id number] PCP:  [name], MD  Cardiologist:  Larae Grooms, MD   Chief Complaint: f/u afib  History of Present Illness:  Gary [name] is a 67 y.o. male with history of paroxysmal atrial fib, paroxysmal atrial flutter s/p ablation, tachy-mediated cardiomyopathy in 2005, habitual alcohol intake, negative ETT 01/2017, obesity, HTN, HLD who presents for evaluation of atrial fib. He had a reported nonischemic (tachycardiac mediated) cardiomyopathy in 2005. He underwent atrial flutter ablation by Dr Lovena Le in 2007. His EF recovered while in NSR. In 2018 he developed recurrent atrial fibrillation in the form of irregular pulse, fatigue, and decreased exercise tolerance. This occurred in setting of increased alcohol and stress. He was placed on Eliquis.  He was planned for TEE guided cardioversion 06/23/16 however the TEE probe could not be passed despite multiple attempts. He was placed on flecainide and cardioverted after 4 weeks of compliance with Eliquis. He underwent DCCV 07/20/16. Echocardiogram 06/2016 showed EF 50-55%, mildly increased PASP. He had nonischemic ETT 01/2017 with mildly impaired exercise tolerance. Last labs 04/2017 showed Cr 1.08, K 4.7, Hgb 16.3, 08/2016 normal LFTs, LDL 90. He has seen afib clinic and [name] before as well. He also has prior history of dizziness with primarily orthostatic component, even when in NSR in the past (see Scott's note 03/2015 with dizziness/falls). This has been intermittent for years without apparent pattern or cause.  He presents to clinic for eval of recurrent atrial fib. On Sept 25th he had a spinal steroid injection for L leg pain and within a few days noticed the recurrence of dizziness. He has a hard time describing it, perhaps lightheadedness, which occurs primarily going from lying to sitting up. On one occasion he had to lay immediately back in the bed.  No associated palpitations, SOB, chest pain. He states every time he had his BP/HR checked between then and now, it was normal and there was not any specific indication he was in afib. He had another injection on Thursday 10/24. He had to hold his anticoagulation again for this as well but resumed that night. He took a lot of ibuprofen and Tylenol for his leg pain recently. On Saturday he felt something was off so checked his HR and it was now irregular, hence today's OV. He does feel the "room spinning" sometimes when he lies down. Otherwise denies any slurred speech, facial asymmetry, focal weakness, or any other neurologic sequelae. No headache. No syncope or bleeding reported. He does not feel dizzy currently while in atrial fib today which is rate controlled. He does not snore but has felt some fatigue since retirement, possibly more over the last week or so - but states he otherwise cant feel his atrial fib. He reports compliance with meds otherwise aside from recent interruption in anticoagulation. Orthostatics were obtained: BP 147/102 and HR 90 lying, 136/93 and HR 88 sitting (dizzy), 134/86 and HR 87 (standing 14m), 126/88 and HR 75 (standing 46min). He states he's actually been drinking less or not at all lately.    Past Medical History:  Diagnosis Date  . Atrial flutter ([place]) 2007   s/p CTI ablation by Dr Lovena Le  . Back pain   . Hyperlipidemia   . Hypertension   . Insomnia   . NICM (nonischemic cardiomyopathy) ([place])    a. 2005 - tachy mediated. normalized after return of NSR.  [name] Obesity   .  Paroxysmal atrial fibrillation [facility])     Past Surgical History:  Procedure Laterality Date  . atrial flutter ablation  06/10/2005   CTI ablation by Dr Lovena Le for atrial flutter  . CARDIOVERSION N/A 07/20/2016   Procedure: CARDIOVERSION;  Surgeon: Minus Breeding, MD;  Location: [facility] ENDOSCOPY;  Service: Cardiovascular;  Laterality: N/A;  . NO PAST SURGERIES      Current Medications: Current Meds   Medication Sig  . amitriptyline (ELAVIL) 50 MG tablet TAKE 1-2 TABLETS BY MOUTH (50- 100 MG MAX) DAILY AT BEDTIME  . apixaban (ELIQUIS) 5 MG TABS tablet Take 1 tablet (5 mg total) by mouth 2 (two) times daily.  [name] atorvastatin (LIPITOR) 10 MG tablet Take 10 mg by mouth daily.  . colchicine 0.6 MG tablet Take 0.6 mg by mouth daily as needed (FOR GOUT).   . cyclobenzaprine (FLEXERIL) 10 MG tablet Take 10 mg by mouth as needed for muscle spasms.  [name] diltiazem (CARTIA XT) 180 MG 24 hr capsule Take 1 capsule (180 mg total) by mouth daily.  . flecainide (TAMBOCOR) 100 MG tablet Take 1 tablet (100 mg total) by mouth 2 (two) times daily.  . metoprolol succinate (TOPROL-XL) 25 MG 24 hr tablet Take 1 tablet (25 mg total) by mouth daily.  . Multiple Vitamin (MULTIVITAMIN) capsule Take 1 capsule by mouth daily.  . Omega-3 Fatty Acids (FISH OIL) 1200 MG CAPS Take 1,200 mg by mouth 2 (two) times daily.   . valACYclovir (VALTREX) 1000 MG tablet Take 1,000 mg by mouth daily as needed (FOR COLD SORES). As directed  . vitamin E (VITAMIN E) 400 UNIT capsule Take 400 Units by mouth daily.   Current Facility-Administered Medications for the 11/22/17 encounter (Office Visit) with Charlie Pitter, PA-C  Medication  . methylPREDNISolone acetate (DEPO-MEDROL) injection 40 mg      Allergies:   Penicillins   Social History   Socioeconomic History  . Marital status: Married    Spouse name: Not on file  . Number of children: Not on file  . Years of education: Not on file  . Highest education level: Not on file  Occupational History  . Not on file  Social Needs  . Financial resource strain: Not on file  . Food insecurity:    Worry: Not on file    Inability: Not on file  . Transportation needs:    Medical: Not on file    Non-medical: Not on file  Tobacco Use  . Smoking status: Former Research scientist (life sciences)  . Smokeless tobacco: Never Used  Substance and Sexual Activity  . Alcohol use: Yes    Comment: 3-4 drinks per day    . Drug use: No  . Sexual activity: Yes  Lifestyle  . Physical activity:    Days per week: Not on file    Minutes per session: Not on file  . Stress: Not on file  Relationships  . Social connections:    Talks on phone: Not on file    Gets together: Not on file    Attends religious service: Not on file    Active member of club or organization: Not on file    Attends meetings of clubs or organizations: Not on file    Relationship status: Not on file  Other Topics Concern  . Not on file  Social History Narrative   Lives in [place]   Retired from [employer]     Family History:  The patient's family history includes Heart attack in his father; Hypertension in  his father and mother.  ROS:   Please see the history of present illness.  All other systems are reviewed and otherwise negative.    PHYSICAL EXAM:   VS:  Ht 6\' 1"  (1.854 m)   Wt 232 lb (105.2 kg)   SpO2 97%   BMI 30.61 kg/m   BMI: Body mass index is 30.61 kg/m. BP as above. HR 87. GEN: Well nourished, well developed WM in no acute distress HEENT: normocephalic, atraumatic Neck: no JVD, carotid bruits, or masses Cardiac: irregularly irregular, rate controlled; no murmurs, rubs, or gallops, no edema  Respiratory:  clear to auscultation bilaterally, normal work of breathing GI: soft, nontender, nondistended, + BS MS: no deformity or atrophy Skin: warm and dry, no rash Neuro:  Alert and Oriented x 3, Strength and sensation are intact, follows commands Psych: euthymic mood, full affect  Wt Readings from Last 3 Encounters:  11/22/17 232 lb (105.2 kg)  09/28/17 230 lb (104.3 kg)  08/24/17 230 lb (104.3 kg)      Studies/Labs Reviewed:   EKG:  EKG was ordered today and personally reviewed by me and demonstrates coarse atrial fib 87bpm, QTc 466ms   Recent Labs: 05/04/2017: BUN 10; Creatinine, Ser 1.08; Hemoglobin 16.3; Platelets 194; Potassium 4.7; Sodium 138   Lipid Panel    Component Value Date/Time   CHOL 176  08/09/2010 0036   TRIG 234 (H) 08/09/2010 0036   HDL 46 08/09/2010 0036   CHOLHDL 3.8 08/09/2010 0036   VLDL 47 (H) 08/09/2010 0036   LDLCALC 83 08/09/2010 0036    Additional studies/ records that were reviewed today include: Summarized above.    ASSESSMENT & PLAN:   1. Paroxysmal atrial fibrillation - EKG shows what appears to be coarse atrial fib similar to 2018. Question triggered by recent steroid administration or habitual NSAID use - true trigger may remain elusive. He recently had brief interruption in anticoagulation on 2 occasions so cannot proceed directly to DCCV. Would not pursue TEE as he is relatively asymptomatic from AF standpoint and previously had difficulty passing probe. His meds were previously decreased due to dizziness and HR is controlled so will not change meds. Plan to check labs to exclude metabolic contribution and update echocardiogram. Will have him seen back in the AF clinic to consider timing of DCCV versus whether antiarrhythmic needs to be changed. 2. Dizziness - primarily orthostatic in nature with going from lying to sitting briefly, but also with what sounds like vertigo while lying down. No other focal neuro symptoms. I asked him to discuss with spine procedure physician given that symptoms emerged after initial injection. However, it's unclear there is any relationship of this to symptoms since he reports this has been an intermittent issue over at least 5 years' time. It's not clear that it's related to AF as this began several weeks ago but he did not detect atrial fib by pulse check until this weekend. He did not become hypotensive with orthostatics. He had dizziness when pulse and BP were normal. Of note, brain imaging in 2016 was unremarkable and he has no carotid bruit on exam. If labwork is unrevealing and spinal MD does not feel procedurally related, would suggest he see primary care for further management. 3. Paroxysmal atrial flutter - continue  surveillance. EKG today has flutterlike appearance but has been called fib in the past by [name]. 4. History of NICM - last EF 50-55%, will update with this recurrent episode. 5. Essential HTN - given dizziness will  not make any changes today.  Disposition: F/u with Afib clinic within 10 days.  Medication Adjustments/Labs and Tests Ordered: Current medicines are reviewed at length with the patient today.  Concerns regarding medicines are outlined above. Medication changes, Labs and Tests ordered today are summarized above and listed in the Patient Instructions accessible in Encounters.   Signed, Charlie Pitter, PA-C  11/22/2017 12:28 PM    [facility] [place], [place], [place]  [address] Phone: [phone]; Fax: [phone]

## 2017-11-21 NOTE — Telephone Encounter (Signed)
No message needed °

## 2017-11-22 ENCOUNTER — Ambulatory Visit: Payer: PPO | Admitting: Physician Assistant

## 2017-11-22 ENCOUNTER — Encounter: Payer: Self-pay | Admitting: Physician Assistant

## 2017-11-22 VITALS — Ht 73.0 in | Wt 232.0 lb

## 2017-11-22 DIAGNOSIS — I480 Paroxysmal atrial fibrillation: Secondary | ICD-10-CM

## 2017-11-22 DIAGNOSIS — I10 Essential (primary) hypertension: Secondary | ICD-10-CM | POA: Diagnosis not present

## 2017-11-22 DIAGNOSIS — R42 Dizziness and giddiness: Secondary | ICD-10-CM

## 2017-11-22 DIAGNOSIS — I4892 Unspecified atrial flutter: Secondary | ICD-10-CM

## 2017-11-22 DIAGNOSIS — Z8679 Personal history of other diseases of the circulatory system: Secondary | ICD-10-CM | POA: Diagnosis not present

## 2017-11-22 NOTE — Patient Instructions (Signed)
Medication Instructions:  Your physician recommends that you continue on your current medications as directed. Please refer to the Current Medication list given to you today.   If you need a refill on your cardiac medications before your next appointment, please call your pharmacy.   Lab work: TODAY;  BMET, CBC, MAGNESIUM, & TSH  If you have labs (blood work) drawn today and your tests are completely normal, you will receive your results only by: [name] MyChart Message (if you have MyChart) OR . A paper copy in the mail If you have any lab test that is abnormal or we need to change your treatment, we will call you to review the results.  Testing/Procedures: None ordered  Follow-Up: Your physician recommends that you schedule a follow-up appointment in: WITHIN 10 DAYS IN THE AFIB CLININC   Any Other Special Instructions Will Be Listed Below (If Applicable).

## 2017-11-23 ENCOUNTER — Telehealth: Payer: Self-pay | Admitting: *Deleted

## 2017-11-23 LAB — BASIC METABOLIC PANEL
BUN / CREAT RATIO: 16 (ref 10–24)
BUN: 19 mg/dL (ref 8–27)
CALCIUM: 9.4 mg/dL (ref 8.6–10.2)
CO2: 22 mmol/L (ref 20–29)
CREATININE: 1.16 mg/dL (ref 0.76–1.27)
Chloride: 99 mmol/L (ref 96–106)
GFR calc Af Amer: 74 mL/min/{1.73_m2} (ref >59)
GFR, EST NON AFRICAN AMERICAN: 64 mL/min/{1.73_m2} (ref >59)
Glucose: 110 mg/dL — ABNORMAL HIGH (ref 65–99)
Potassium: 4.8 mmol/L (ref 3.5–5.2)
Sodium: 140 mmol/L (ref 134–144)

## 2017-11-23 LAB — TSH: TSH: 3.09 u[IU]/mL (ref 0.450–4.500)

## 2017-11-23 LAB — MAGNESIUM: Magnesium: 2.4 mg/dL — ABNORMAL HIGH (ref 1.6–2.3)

## 2017-11-23 LAB — CBC
HEMATOCRIT: 53.3 % — AB (ref 37.5–51.0)
Hemoglobin: 18.6 g/dL — ABNORMAL HIGH (ref 13.0–17.7)
MCH: 33.1 pg — AB (ref 26.6–33.0)
MCHC: 34.9 g/dL (ref 31.5–35.7)
MCV: 95 fL (ref 79–97)
Platelets: 264 10*3/uL (ref 150–450)
RBC: 5.62 x10E6/uL (ref 4.14–5.80)
RDW: 13.9 % (ref 12.3–15.4)
WBC: 10 10*3/uL (ref 3.4–10.8)

## 2017-11-23 NOTE — Telephone Encounter (Signed)
-----   Message from Charlie Pitter, PA-C sent at 11/23/2017  9:00 AM EDT ----- Please call patient. Labs are relatively normal except: - blood sugar mildly elevated - interestingly, his BUN, creatinine, magnesium and hemoglobin have all trended upward since last check in April. This may suggest he is dehydrated and so he should be making sure to drink adequate water intake throughout the day. I would also suggest he see his PCP for recheck within a week because the elevated Hgb level can also indicate a condition called polycythemia vera. Not clear that's what's going on since the other labs point towards being "dry" but worth looking into. Otherwise f/u as planned. Dayna Dunn PA-C

## 2017-11-23 NOTE — Telephone Encounter (Signed)
Patient returning call.

## 2017-11-23 NOTE — Telephone Encounter (Signed)
Called pt re: lab results. Left a message for pt to call back. 

## 2017-11-23 NOTE — Telephone Encounter (Signed)
Pt has been made aware of his lab results. He stated that he could assure you that it is not dehydration, that he takes in plenty of fluid daily. With that being said, pt was advised to contact his pcp re: an appt soon. Pt agreed and thanked me for the call. I also cc'd Dr. [name] on this as well, per pt request.

## 2017-11-24 ENCOUNTER — Ambulatory Visit (HOSPITAL_COMMUNITY): Payer: PPO | Attending: Physician Assistant

## 2017-11-24 ENCOUNTER — Other Ambulatory Visit: Payer: Self-pay

## 2017-11-24 DIAGNOSIS — I4892 Unspecified atrial flutter: Secondary | ICD-10-CM | POA: Insufficient documentation

## 2017-11-24 DIAGNOSIS — I480 Paroxysmal atrial fibrillation: Secondary | ICD-10-CM | POA: Insufficient documentation

## 2017-11-24 DIAGNOSIS — I10 Essential (primary) hypertension: Secondary | ICD-10-CM | POA: Diagnosis not present

## 2017-11-24 DIAGNOSIS — Z8679 Personal history of other diseases of the circulatory system: Secondary | ICD-10-CM | POA: Diagnosis not present

## 2017-11-25 DIAGNOSIS — D751 Secondary polycythemia: Secondary | ICD-10-CM | POA: Diagnosis not present

## 2017-11-28 ENCOUNTER — Telehealth: Payer: Self-pay | Admitting: *Deleted

## 2017-11-28 NOTE — Telephone Encounter (Signed)
-----   Message from Charlie Pitter, [place] sent at 11/25/2017  4:03 PM EDT ----- Please let patient know heart function is normal. The left atrium is mildly dilated which is a finding commonly associated with atrial fib. Continue plan as discussed. Dayna Dunn PA-C

## 2017-11-28 NOTE — Telephone Encounter (Signed)
Follow up   Patient is returning call about echo results.

## 2017-11-28 NOTE — Telephone Encounter (Signed)
Returned pts call and he has been made aware of his echo results. Pt verbalized understanding.

## 2017-11-28 NOTE — Telephone Encounter (Signed)
Called pt re: echo results and left a message for him to call back.

## 2017-12-02 ENCOUNTER — Ambulatory Visit (HOSPITAL_COMMUNITY)
Admission: RE | Admit: 2017-12-02 | Discharge: 2017-12-02 | Disposition: A | Discharge status: Home / Self Care | Payer: PPO | Source: Ambulatory Visit | Attending: Nurse Practitioner | Admitting: Nurse Practitioner

## 2017-12-02 ENCOUNTER — Encounter (HOSPITAL_COMMUNITY): Payer: Self-pay | Admitting: Nurse Practitioner

## 2017-12-02 ENCOUNTER — Other Ambulatory Visit (HOSPITAL_COMMUNITY): Payer: Self-pay | Admitting: *Deleted

## 2017-12-02 VITALS — BP 114/74 | HR 75 | Ht 73.0 in | Wt 237.0 lb

## 2017-12-02 DIAGNOSIS — G4700 Insomnia, unspecified: Secondary | ICD-10-CM | POA: Insufficient documentation

## 2017-12-02 DIAGNOSIS — Z7901 Long term (current) use of anticoagulants: Secondary | ICD-10-CM | POA: Insufficient documentation

## 2017-12-02 DIAGNOSIS — E669 Obesity, unspecified: Secondary | ICD-10-CM | POA: Diagnosis not present

## 2017-12-02 DIAGNOSIS — E785 Hyperlipidemia, unspecified: Secondary | ICD-10-CM | POA: Diagnosis not present

## 2017-12-02 DIAGNOSIS — Z87891 Personal history of nicotine dependence: Secondary | ICD-10-CM | POA: Insufficient documentation

## 2017-12-02 DIAGNOSIS — I4819 Other persistent atrial fibrillation: Secondary | ICD-10-CM | POA: Insufficient documentation

## 2017-12-02 DIAGNOSIS — I10 Essential (primary) hypertension: Secondary | ICD-10-CM | POA: Insufficient documentation

## 2017-12-02 DIAGNOSIS — I428 Other cardiomyopathies: Secondary | ICD-10-CM | POA: Diagnosis not present

## 2017-12-02 DIAGNOSIS — Z880 Allergy status to penicillin: Secondary | ICD-10-CM | POA: Insufficient documentation

## 2017-12-02 DIAGNOSIS — Z8249 Family history of ischemic heart disease and other diseases of the circulatory system: Secondary | ICD-10-CM | POA: Insufficient documentation

## 2017-12-02 DIAGNOSIS — Z79899 Other long term (current) drug therapy: Secondary | ICD-10-CM | POA: Insufficient documentation

## 2017-12-02 DIAGNOSIS — I4891 Unspecified atrial fibrillation: Secondary | ICD-10-CM | POA: Diagnosis not present

## 2017-12-02 LAB — CBC WITH DIFFERENTIAL/PLATELET
ABS IMMATURE GRANULOCYTES: 0.01 10*3/uL (ref 0.00–0.07)
BASOS ABS: 0.1 10*3/uL (ref 0.0–0.1)
Basophils Relative: 1 %
Eosinophils Absolute: 0.1 10*3/uL (ref 0.0–0.5)
Eosinophils Relative: 2 %
HEMATOCRIT: 48.1 % (ref 39.0–52.0)
HEMOGLOBIN: 15.9 g/dL (ref 13.0–17.0)
IMMATURE GRANULOCYTES: 0 %
LYMPHS ABS: 3.1 10*3/uL (ref 0.7–4.0)
LYMPHS PCT: 45 %
MCH: 31.9 pg (ref 26.0–34.0)
MCHC: 33.1 g/dL (ref 30.0–36.0)
MCV: 96.4 fL (ref 80.0–100.0)
MONOS PCT: 8 %
Monocytes Absolute: 0.6 10*3/uL (ref 0.1–1.0)
NEUTROS ABS: 3 10*3/uL (ref 1.7–7.7)
NEUTROS PCT: 44 %
Platelets: 191 10*3/uL (ref 150–400)
RBC: 4.99 MIL/uL (ref 4.22–5.81)
RDW: 15 % (ref 11.5–15.5)
WBC: 6.8 10*3/uL (ref 4.0–10.5)
nRBC: 0 % (ref 0.0–0.2)

## 2017-12-02 LAB — BASIC METABOLIC PANEL
ANION GAP: 7 (ref 5–15)
BUN: 12 mg/dL (ref 8–23)
CHLORIDE: 103 mmol/L (ref 98–111)
CO2: 28 mmol/L (ref 22–32)
Calcium: 9 mg/dL (ref 8.9–10.3)
Creatinine, Ser: 1.03 mg/dL (ref 0.61–1.24)
GFR calc Af Amer: >60 mL/min (ref >60)
GFR calc non Af Amer: >60 mL/min (ref >60)
GLUCOSE: 109 mg/dL — AB (ref 70–99)
POTASSIUM: 4.5 mmol/L (ref 3.5–5.1)
Sodium: 138 mmol/L (ref 135–145)

## 2017-12-02 MED ORDER — TRAMADOL HCL 50 MG PO TABS: 50.0000 mg | ORAL_TABLET | Freq: Four times a day (QID) | ORAL | 0 refills | Status: DC | PRN

## 2017-12-02 NOTE — Addendum Note (Signed)
Addended by: Dene Gentry on: 12/02/2017 12:24 PM   Modules accepted: Orders

## 2017-12-02 NOTE — Progress Notes (Signed)
Primary Care Physician: [name], MD Referring Physician: [name] Cardiologist: [name] is a 67 y.o. male with a h/o afib on flecainide for years. He had breakthrough afib after stress over a family matter and too much alcohol. He had not been on anticoagulation and eliquis was restarted. TEE guided cardioversion was tried but the tube could not be passed. He was referred to [name]. He suggested several more weeks of anticoagulation and then cardioversion. This was done 07/20/16 and was successful and pt has been in SR since then.  He recently went back into afib after receiving a cortisone shot and oral prednisone for a back issue, "pinched nerve" which is still causing him pain  He is rate controlled. He missed anticoagulation for the back injection but has been back on anticoagulation since 10/25. He does have a chadsvasc score of 2(htn, age). He denies any alcohol use.Echo was recently updated without any significant change.He is taking 800 mg motrin at hs for the nerve pain so he can sleep. We discussed that this puts him at risk for GI bleed with his current use of eliquis.  Today, he denies symptoms of palpitations, chest pain, shortness of breath, orthopnea, PND, lower extremity edema, dizziness, presyncope, syncope, or neurologic sequela. The patient is tolerating medications without difficulties and is otherwise without complaint today.   Past Medical History:  Diagnosis Date  . Atrial flutter ([place]) 2007   s/p CTI ablation by Dr Lovena Le  . Back pain   . Hyperlipidemia   . Hypertension   . Insomnia   . NICM (nonischemic cardiomyopathy) ([place])    a. 2005 - tachy mediated. normalized after return of NSR.  [name] Obesity   . Paroxysmal atrial fibrillation [facility])    Past Surgical History:  Procedure Laterality Date  . atrial flutter ablation  06/10/2005   CTI ablation by Dr Lovena Le for atrial flutter  . CARDIOVERSION N/A 07/20/2016   Procedure:  CARDIOVERSION;  Surgeon: Minus Breeding, MD;  Location: [facility] ENDOSCOPY;  Service: Cardiovascular;  Laterality: N/A;  . NO PAST SURGERIES      Current Outpatient Medications  Medication Sig Dispense Refill  . amitriptyline (ELAVIL) 50 MG tablet TAKE 1-2 TABLETS BY MOUTH (50- 100 MG MAX) DAILY AT BEDTIME    . apixaban (ELIQUIS) 5 MG TABS tablet Take 1 tablet (5 mg total) by mouth 2 (two) times daily. 180 tablet 1  . atorvastatin (LIPITOR) 10 MG tablet Take 10 mg by mouth daily.    [name] diltiazem (CARTIA XT) 180 MG 24 hr capsule Take 1 capsule (180 mg total) by mouth daily. 90 capsule 3  . flecainide (TAMBOCOR) 100 MG tablet Take 1 tablet (100 mg total) by mouth 2 (two) times daily. 180 tablet 1  . metoprolol succinate (TOPROL-XL) 25 MG 24 hr tablet Take 1 tablet (25 mg total) by mouth daily. 90 tablet 2  . Multiple Vitamin (MULTIVITAMIN) capsule Take 1 capsule by mouth daily.    . Omega-3 Fatty Acids (FISH OIL) 1200 MG CAPS Take 1,200 mg by mouth 2 (two) times daily.     . vitamin E (VITAMIN E) 400 UNIT capsule Take 400 Units by mouth daily.    . colchicine 0.6 MG tablet Take 0.6 mg by mouth daily as needed (FOR GOUT).     . cyclobenzaprine (FLEXERIL) 10 MG tablet Take 10 mg by mouth as needed for muscle spasms.    . valACYclovir (VALTREX) 1000 MG tablet Take 1,000 mg by  mouth daily as needed (FOR COLD SORES). As directed     Current Facility-Administered Medications  Medication Dose Route Frequency Provider Last Rate Last Dose  . methylPREDNISolone acetate (DEPO-MEDROL) injection 40 mg  40 mg Intramuscular Once [name], DO        Allergies  Allergen Reactions  . Penicillins Rash    Social History   Socioeconomic History  . Marital status: Married    Spouse name: Not on file  . Number of children: Not on file  . Years of education: Not on file  . Highest education level: Not on file  Occupational History  . Not on file  Social Needs  . Financial resource strain: Not on  file  . Food insecurity:    Worry: Not on file    Inability: Not on file  . Transportation needs:    Medical: Not on file    Non-medical: Not on file  Tobacco Use  . Smoking status: Former Research scientist (life sciences)  . Smokeless tobacco: Never Used  Substance and Sexual Activity  . Alcohol use: Yes    Comment: 3-4 drinks per day  . Drug use: No  . Sexual activity: Yes  Lifestyle  . Physical activity:    Days per week: Not on file    Minutes per session: Not on file  . Stress: Not on file  Relationships  . Social connections:    Talks on phone: Not on file    Gets together: Not on file    Attends religious service: Not on file    Active member of club or organization: Not on file    Attends meetings of clubs or organizations: Not on file    Relationship status: Not on file  . Intimate partner violence:    Fear of current or ex partner: Not on file    Emotionally abused: Not on file    Physically abused: Not on file    Forced sexual activity: Not on file  Other Topics Concern  . Not on file  Social History Narrative   Lives in [place]   Retired from [employer]    Family History  Problem Relation Age of Onset  . Heart attack Father   . Hypertension Father   . Hypertension Mother     ROS- All systems are reviewed and negative except as per the HPI above  Physical Exam: Vitals:   12/02/17 0831  BP: 114/74  Pulse: 75  Weight: 107.5 kg  Height: 6\' 1"  (1.854 m)   Wt Readings from Last 3 Encounters:  12/02/17 107.5 kg  11/22/17 105.2 kg  09/28/17 104.3 kg    Labs: Lab Results  Component Value Date   NA 140 11/22/2017   K 4.8 11/22/2017   CL 99 11/22/2017   CO2 22 11/22/2017   GLUCOSE 110 (H) 11/22/2017   BUN 19 11/22/2017   CREATININE 1.16 11/22/2017   CALCIUM 9.4 11/22/2017   MG 2.4 (H) 11/22/2017   Lab Results  Component Value Date   INR 1.0 06/17/2016   Lab Results  Component Value Date   CHOL 176 08/09/2010   HDL 46 08/09/2010   LDLCALC 83 08/09/2010   TRIG 234  (H) 08/09/2010     GEN- The patient is well appearing, alert and oriented x 3 today.   Head- normocephalic, atraumatic Eyes-  Sclera clear, conjunctiva pink Ears- hearing intact Oropharynx- clear Neck- supple, no JVP Lymph- no cervical lymphadenopathy Lungs- Clear to ausculation bilaterally, normal work of breathing Heart- irregular  rate and rhythm, no murmurs, rubs or gallops, PMI not laterally displaced GI- soft, NT, ND, + BS Extremities- no clubbing, cyanosis, or edema MS- no significant deformity or atrophy Skin- no rash or lesion Psych- euthymic mood, full affect Neuro- strength and sensation are intact  EKG- afib  at 75  Bpm, qrs int 94 ms, qtc 417 ms Epic records reviewed Echo Study Conclusions Study Conclusions  - Left ventricle: The cavity size was at the upper limits of   normal. Systolic function was normal. The estimated ejection   fraction was in the range of 55% to 60%. Wall motion was normal;   there were no regional wall motion abnormalities. The study was   not technically sufficient to allow evaluation of LV diastolic   dysfunction due to atrial fibrillation. - Aortic valve: Poorly visualized. Trileaflet; normal thickness,   mildly calcified leaflets. - Mitral valve: Calcified annulus. There was trivial regurgitation. - Left atrium: The atrium was mildly dilated. - Tricuspid valve: There was trivial regurgitati   Assessment and Plan: 1. Persistent afib  Successful cardioversion 06/2616 Possible trigger for afib since 10/2517 is oral steroids and prednisone injection for back issues Continue eliquis 5 mg bid for chadsvasc score of 2, uninterrupted since 10/25 Will schedule cardioversion 11/19 Cbc/bmet today Continue flecainide 100 mg bid Will not recommend change of antiarrythmic at this time but if ERAF may have to discuss Diltiazem 180 mg daily He was warned that high dose NSAIDS's in combination with DOAC can put him at risk for GI bleed  afib  clinic one week after cardioversion   Butch Penny C. Laray Corbit, [facility] [address] [place], [place] [address] [phone]

## 2017-12-02 NOTE — Patient Instructions (Signed)
Your cardioversion is scheduled for : Tuesday 12/13/2017 at 1:45 pm Arrive at the [facility] and go to admitting at 12:45 Do Not eat or drink anything after midnight the night prior to your procedure. Take all your medications with a sip of water prior to arrival. Do NOT miss any doses of your blood thinner. You will NOT be able to drive home after your procedure.

## 2017-12-02 NOTE — Addendum Note (Signed)
Addended by: Sherrie George F on: 12/02/2017 12:28 PM   Modules accepted: Orders

## 2017-12-02 NOTE — H&P (View-Only) (Signed)
Primary Care Physician: [name], MD Referring Physician: [name] Cardiologist: [name] is a 67 y.o. male with a h/o afib on flecainide for years. He had breakthrough afib after stress over a family matter and too much alcohol. He had not been on anticoagulation and eliquis was restarted. TEE guided cardioversion was tried but the tube could not be passed. He was referred to [name]. He suggested several more weeks of anticoagulation and then cardioversion. This was done 07/20/16 and was successful and pt has been in SR since then.  He recently went back into afib after receiving a cortisone shot and oral prednisone for a back issue, "pinched nerve" which is still causing him pain  He is rate controlled. He missed anticoagulation for the back injection but has been back on anticoagulation since 10/25. He does have a chadsvasc score of 2(htn, age). He denies any alcohol use.Echo was recently updated without any significant change.He is taking 800 mg motrin at hs for the nerve pain so he can sleep. We discussed that this puts him at risk for GI bleed with his current use of eliquis.  Today, he denies symptoms of palpitations, chest pain, shortness of breath, orthopnea, PND, lower extremity edema, dizziness, presyncope, syncope, or neurologic sequela. The patient is tolerating medications without difficulties and is otherwise without complaint today.   Past Medical History:  Diagnosis Date  . Atrial flutter ([place]) 2007   s/p CTI ablation by Dr Lovena Le  . Back pain   . Hyperlipidemia   . Hypertension   . Insomnia   . NICM (nonischemic cardiomyopathy) ([place])    a. 2005 - tachy mediated. normalized after return of NSR.  [name] Obesity   . Paroxysmal atrial fibrillation [facility])    Past Surgical History:  Procedure Laterality Date  . atrial flutter ablation  06/10/2005   CTI ablation by Dr Lovena Le for atrial flutter  . CARDIOVERSION N/A 07/20/2016   Procedure:  CARDIOVERSION;  Surgeon: Minus Breeding, MD;  Location: [facility] ENDOSCOPY;  Service: Cardiovascular;  Laterality: N/A;  . NO PAST SURGERIES      Current Outpatient Medications  Medication Sig Dispense Refill  . amitriptyline (ELAVIL) 50 MG tablet TAKE 1-2 TABLETS BY MOUTH (50- 100 MG MAX) DAILY AT BEDTIME    . apixaban (ELIQUIS) 5 MG TABS tablet Take 1 tablet (5 mg total) by mouth 2 (two) times daily. 180 tablet 1  . atorvastatin (LIPITOR) 10 MG tablet Take 10 mg by mouth daily.    [name] diltiazem (CARTIA XT) 180 MG 24 hr capsule Take 1 capsule (180 mg total) by mouth daily. 90 capsule 3  . flecainide (TAMBOCOR) 100 MG tablet Take 1 tablet (100 mg total) by mouth 2 (two) times daily. 180 tablet 1  . metoprolol succinate (TOPROL-XL) 25 MG 24 hr tablet Take 1 tablet (25 mg total) by mouth daily. 90 tablet 2  . Multiple Vitamin (MULTIVITAMIN) capsule Take 1 capsule by mouth daily.    . Omega-3 Fatty Acids (FISH OIL) 1200 MG CAPS Take 1,200 mg by mouth 2 (two) times daily.     . vitamin E (VITAMIN E) 400 UNIT capsule Take 400 Units by mouth daily.    . colchicine 0.6 MG tablet Take 0.6 mg by mouth daily as needed (FOR GOUT).     . cyclobenzaprine (FLEXERIL) 10 MG tablet Take 10 mg by mouth as needed for muscle spasms.    . valACYclovir (VALTREX) 1000 MG tablet Take 1,000 mg by  mouth daily as needed (FOR COLD SORES). As directed     Current Facility-Administered Medications  Medication Dose Route Frequency Provider Last Rate Last Dose  . methylPREDNISolone acetate (DEPO-MEDROL) injection 40 mg  40 mg Intramuscular Once [name], DO        Allergies  Allergen Reactions  . Penicillins Rash    Social History   Socioeconomic History  . Marital status: Married    Spouse name: Not on file  . Number of children: Not on file  . Years of education: Not on file  . Highest education level: Not on file  Occupational History  . Not on file  Social Needs  . Financial resource strain: Not on  file  . Food insecurity:    Worry: Not on file    Inability: Not on file  . Transportation needs:    Medical: Not on file    Non-medical: Not on file  Tobacco Use  . Smoking status: Former Research scientist (life sciences)  . Smokeless tobacco: Never Used  Substance and Sexual Activity  . Alcohol use: Yes    Comment: 3-4 drinks per day  . Drug use: No  . Sexual activity: Yes  Lifestyle  . Physical activity:    Days per week: Not on file    Minutes per session: Not on file  . Stress: Not on file  Relationships  . Social connections:    Talks on phone: Not on file    Gets together: Not on file    Attends religious service: Not on file    Active member of club or organization: Not on file    Attends meetings of clubs or organizations: Not on file    Relationship status: Not on file  . Intimate partner violence:    Fear of current or ex partner: Not on file    Emotionally abused: Not on file    Physically abused: Not on file    Forced sexual activity: Not on file  Other Topics Concern  . Not on file  Social History Narrative   Lives in [place]   Retired from [employer]    Family History  Problem Relation Age of Onset  . Heart attack Father   . Hypertension Father   . Hypertension Mother     ROS- All systems are reviewed and negative except as per the HPI above  Physical Exam: Vitals:   12/02/17 0831  BP: 114/74  Pulse: 75  Weight: 107.5 kg  Height: 6\' 1"  (1.854 m)   Wt Readings from Last 3 Encounters:  12/02/17 107.5 kg  11/22/17 105.2 kg  09/28/17 104.3 kg    Labs: Lab Results  Component Value Date   NA 140 11/22/2017   K 4.8 11/22/2017   CL 99 11/22/2017   CO2 22 11/22/2017   GLUCOSE 110 (H) 11/22/2017   BUN 19 11/22/2017   CREATININE 1.16 11/22/2017   CALCIUM 9.4 11/22/2017   MG 2.4 (H) 11/22/2017   Lab Results  Component Value Date   INR 1.0 06/17/2016   Lab Results  Component Value Date   CHOL 176 08/09/2010   HDL 46 08/09/2010   LDLCALC 83 08/09/2010   TRIG 234  (H) 08/09/2010     GEN- The patient is well appearing, alert and oriented x 3 today.   Head- normocephalic, atraumatic Eyes-  Sclera clear, conjunctiva pink Ears- hearing intact Oropharynx- clear Neck- supple, no JVP Lymph- no cervical lymphadenopathy Lungs- Clear to ausculation bilaterally, normal work of breathing Heart- irregular  rate and rhythm, no murmurs, rubs or gallops, PMI not laterally displaced GI- soft, NT, ND, + BS Extremities- no clubbing, cyanosis, or edema MS- no significant deformity or atrophy Skin- no rash or lesion Psych- euthymic mood, full affect Neuro- strength and sensation are intact  EKG- afib  at 75  Bpm, qrs int 94 ms, qtc 417 ms Epic records reviewed Echo Study Conclusions Study Conclusions  - Left ventricle: The cavity size was at the upper limits of   normal. Systolic function was normal. The estimated ejection   fraction was in the range of 55% to 60%. Wall motion was normal;   there were no regional wall motion abnormalities. The study was   not technically sufficient to allow evaluation of LV diastolic   dysfunction due to atrial fibrillation. - Aortic valve: Poorly visualized. Trileaflet; normal thickness,   mildly calcified leaflets. - Mitral valve: Calcified annulus. There was trivial regurgitation. - Left atrium: The atrium was mildly dilated. - Tricuspid valve: There was trivial regurgitati   Assessment and Plan: 1. Persistent afib  Successful cardioversion 06/2616 Possible trigger for afib since 10/2517 is oral steroids and prednisone injection for back issues Continue eliquis 5 mg bid for chadsvasc score of 2, uninterrupted since 10/25 Will schedule cardioversion 11/19 Cbc/bmet today Continue flecainide 100 mg bid Will not recommend change of antiarrythmic at this time but if ERAF may have to discuss Diltiazem 180 mg daily He was warned that high dose NSAIDS's in combination with DOAC can put him at risk for GI bleed  afib  clinic one week after cardioversion   Gary [name], [facility] [address] [place], [place] [address] [phone]

## 2017-12-05 ENCOUNTER — Encounter: Payer: Self-pay | Admitting: Family Medicine

## 2017-12-05 ENCOUNTER — Ambulatory Visit (INDEPENDENT_AMBULATORY_CARE_PROVIDER_SITE_OTHER): Payer: PPO | Admitting: Family Medicine

## 2017-12-05 VITALS — BP 116/87 | HR 76 | Ht 73.0 in | Wt 230.0 lb

## 2017-12-05 DIAGNOSIS — M545 Low back pain, unspecified: Secondary | ICD-10-CM

## 2017-12-05 NOTE — Patient Instructions (Signed)
Try the tramadol with a 500mg  tablet of tylenol to see if you get better relief - they usually enhance the benefit of each other. Start therapy in meantime. We will refer you to neurosurgery - hopefully they can see you before the 19th. Follow up will depend on the neurosurgery appointment, how you respond to therapy.

## 2017-12-06 ENCOUNTER — Encounter: Payer: Self-pay | Admitting: Family Medicine

## 2017-12-06 ENCOUNTER — Ambulatory Visit: Payer: PPO | Attending: Family Medicine

## 2017-12-06 ENCOUNTER — Other Ambulatory Visit: Payer: Self-pay

## 2017-12-06 DIAGNOSIS — M5442 Lumbago with sciatica, left side: Secondary | ICD-10-CM | POA: Diagnosis not present

## 2017-12-06 DIAGNOSIS — M25651 Stiffness of right hip, not elsewhere classified: Secondary | ICD-10-CM | POA: Diagnosis not present

## 2017-12-06 DIAGNOSIS — G8929 Other chronic pain: Secondary | ICD-10-CM

## 2017-12-06 DIAGNOSIS — M25552 Pain in left hip: Secondary | ICD-10-CM

## 2017-12-06 DIAGNOSIS — M25652 Stiffness of left hip, not elsewhere classified: Secondary | ICD-10-CM

## 2017-12-06 NOTE — Progress Notes (Signed)
PCP: [name], MD  Subjective:   HPI: Patient is a 67 y.o. male here for left hip pain.  08/24/2017: Patient comes in today primarily with left hip pain. He denies any acute injury but states over the past 3 months he has had migratory type of left hip pain. At times he feels anteriorly sometimes posteriorly, gets cramping down the left lateral lower leg is how he perceived this initially. Pain is worse at night. No numbness. Pain is 0 out of 10 at rest but gets up to a 5 out of 10 level and can be sharp. He has been working with [place] at [facility]. At times he will be limping and it can grab him. He also reports her right shoulder pain gets worse with reaching forward and out to the side that is a 0 out of 10 level but gets up to 9 out of 10 and sharp. No skin changes or numbness. Recalls a remote low back injury that he completely improved from. No bowel or bladder dysfunction.  09/28/2017:  Presents for follow up of left hip pain. Patient states that pain has worsened since last office visit.  Pain continues to be migratory with extension into anterior/posterior hip with radiation into anterior and lateral thigh as well as lateral calf.  Pain is present both at rest and with activity. Pain hinders patient's ability to fall asleep. Ibuprofen 600mg  has helped with pain, however, he has only taken this dosage once yesterday. He reports being compliant with exercise regimen and PT. He is discouraged with pain as it has escalated. He denies any fevers/chills, overt back pain, weakness in LE, loss of bowel or bladder control, or saddle paresthesias.  Of note, patient was previously seen for shoulder pain. Shoulder pain has improved.  11/11: Patient returned today after reviewing MRI results lumbar spine over the phone. Tramadol hasn't helped much - tried this yesterday for the first time. Pain up to 7/10 and into left leg, especially the shin and lower leg. No benefit from second ESI  though first one helped quite a bit. Has been walking some for exercise but very limited. No bowel/bladder dysfunction.  Past Medical History:  Diagnosis Date  . Atrial flutter ([place]) 2007   s/p CTI ablation by Dr Lovena Le  . Back pain   . Hyperlipidemia   . Hypertension   . Insomnia   . NICM (nonischemic cardiomyopathy) ([place])    a. 2005 - tachy mediated. normalized after return of NSR.  [name] Obesity   . Paroxysmal atrial fibrillation ([facility])     Current Outpatient Medications on File Prior to Visit  Medication Sig Dispense Refill  . amitriptyline (ELAVIL) 50 MG tablet TAKE 1-2 TABLETS BY MOUTH (50- 100 MG MAX) DAILY AT BEDTIME    . apixaban (ELIQUIS) 5 MG TABS tablet Take 1 tablet (5 mg total) by mouth 2 (two) times daily. 180 tablet 1  . atorvastatin (LIPITOR) 10 MG tablet Take 10 mg by mouth daily.    . colchicine 0.6 MG tablet Take 0.6 mg by mouth daily as needed (FOR GOUT).     . cyclobenzaprine (FLEXERIL) 10 MG tablet Take 10 mg by mouth as needed for muscle spasms.    [name] diltiazem (CARTIA XT) 180 MG 24 hr capsule Take 1 capsule (180 mg total) by mouth daily. 90 capsule 3  . flecainide (TAMBOCOR) 100 MG tablet Take 1 tablet (100 mg total) by mouth 2 (two) times daily. 180 tablet 1  . metoprolol succinate (TOPROL-XL)  25 MG 24 hr tablet Take 1 tablet (25 mg total) by mouth daily. 90 tablet 2  . Multiple Vitamin (MULTIVITAMIN) capsule Take 1 capsule by mouth daily.    . Omega-3 Fatty Acids (FISH OIL) 1200 MG CAPS Take 1,200 mg by mouth 2 (two) times daily.     . traMADol (ULTRAM) 50 MG tablet Take 1 tablet (50 mg total) by mouth every 6 (six) hours as needed. 20 tablet 0  . valACYclovir (VALTREX) 1000 MG tablet Take 1,000 mg by mouth daily as needed (FOR COLD SORES). As directed    . vitamin E (VITAMIN E) 400 UNIT capsule Take 400 Units by mouth daily.     Current Facility-Administered Medications on File Prior to Visit  Medication Dose Route Frequency Provider Last Rate Last Dose  .  methylPREDNISolone acetate (DEPO-MEDROL) injection 40 mg  40 mg Intramuscular Once Thurman Coyer, DO        Past Surgical History:  Procedure Laterality Date  . atrial flutter ablation  06/10/2005   CTI ablation by Dr Lovena Le for atrial flutter  . CARDIOVERSION N/A 07/20/2016   Procedure: CARDIOVERSION;  Surgeon: Minus Breeding, MD;  Location[facility] ENDOSCOPY;  Service: Cardiovascular;  Laterality: N/A;  . NO PAST SURGERIES      Allergies  Allergen Reactions  . Penicillins Rash    Social History   Socioeconomic History  . Marital status: Married    Spouse name: Not on file  . Number of children: Not on file  . Years of education: Not on file  . Highest education level: Not on file  Occupational History  . Not on file  Social Needs  . Financial resource strain: Not on file  . Food insecurity:    Worry: Not on file    Inability: Not on file  . Transportation needs:    Medical: Not on file    Non-medical: Not on file  Tobacco Use  . Smoking status: Former Research scientist (life sciences)  . Smokeless tobacco: Never Used  Substance and Sexual Activity  . Alcohol use: Yes    Comment: 3-4 drinks per day  . Drug use: No  . Sexual activity: Yes  Lifestyle  . Physical activity:    Days per week: Not on file    Minutes per session: Not on file  . Stress: Not on file  Relationships  . Social connections:    Talks on phone: Not on file    Gets together: Not on file    Attends religious service: Not on file    Active member of club or organization: Not on file    Attends meetings of clubs or organizations: Not on file    Relationship status: Not on file  . Intimate partner violence:    Fear of current or ex partner: Not on file    Emotionally abused: Not on file    Physically abused: Not on file    Forced sexual activity: Not on file  Other Topics Concern  . Not on file  Social History Narrative   Lives in [place]   Retired from [employer]    Family History  Problem Relation Age of Onset  .  Heart attack Father   . Hypertension Father   . Hypertension Mother     BP 116/87   Pulse 76   Ht 6\' 1"  (1.854 m)   Wt 230 lb (104.3 kg)   BMI 30.34 kg/m   Review of Systems: See HPI above.     Objective:  Physical Exam:  Gen: alert, cooperative. Requires to stand at times due to hip pain.  Pulm: no conversational dyspnea. No respiratory distress.  Back: No gross deformity, scoliosis. No TTP.  No midline or bony TTP. FROM. Strength LEs 5/5 all muscle groups. Sensation intact to light touch bilaterally.  Rest of exam not repeated today. Left hip: No deformity. Minimal internal rotation with only mild discomfort.  Otherwise FROM with 5/5 strength. No tenderness to palpation. NVI distally. Negative logroll equivocal FABER Negative fadir Sensation intact to light touch bilaterally.  Right hip: No deformity. FROM with 5/5 strength. No tenderness to palpation. NVI distally. Negative log roll Sensation intact to light touch bilaterally   Assessment & Plan:  1. Low back pain radiating into left leg - 2/2 spinal stenosis lumbar spine.  S/p 2 ESIs and second did not provide relief.  Will refer to neurosurgery for their recommendations.  He's due to start flexion-based rehab program in physical therapy also.  Tylenol in addition to his tramadol as needed.  He is scheduled for cardioversion of a-fib on 11/19.  Topical medications, salon pas patches.

## 2017-12-06 NOTE — Patient Instructions (Signed)
Access Code: [email or web address]  URL: [email or web address]  Date: 12/06/2017  Prepared by: Sigurd Sos   Exercises  Hooklying Single Knee to Chest - 3 reps - 1 sets - 20 hold - 3x daily - 7x weekly  Seated Hamstring Stretch - 3 reps - 1 sets - 20 hold - 3x daily - 7x weekly  Supine Double Knee to Chest - 3 reps - 20 hold - 3x daily - 7x weekly

## 2017-12-06 NOTE — Therapy (Signed)
[facility] [address], [place], [place], [address] Phone: [phone]   Fax:  [phone]  Physical Therapy Evaluation  Patient Details  Name: DEQUANE STRAHAN MRN: [id number] Date of Birth: [birth year] Referring Provider (PT): Karlton Lemon, MD   Encounter Date: 12/06/2017  PT End of Session - 12/06/17 1146    Visit Number  1    Date for PT Re-Evaluation  01/31/18    PT Start Time  1102    PT Stop Time  1146    PT Time Calculation (min)  44 min    Activity Tolerance  Patient tolerated treatment well    Behavior During Therapy  [facility] for tasks assessed/performed       Past Medical History:  Diagnosis Date  . Atrial flutter ([place]) 2007   s/p CTI ablation by Dr Lovena Le  . Back pain   . Hyperlipidemia   . Hypertension   . Insomnia   . NICM (nonischemic cardiomyopathy) ([place])    a. 2005 - tachy mediated. normalized after return of NSR.  [name] Obesity   . Paroxysmal atrial fibrillation [facility])     Past Surgical History:  Procedure Laterality Date  . atrial flutter ablation  06/10/2005   CTI ablation by Dr Lovena Le for atrial flutter  . CARDIOVERSION N/A 07/20/2016   Procedure: CARDIOVERSION;  Surgeon: Minus Breeding, MD;  Location: [facility] END[facility]COPY;  Service: Cardiovascular;  Laterality: N/A;  . NO PAST SURGERIES      There were no vitals filed for this visit.   Subjective Assessment - 12/06/17 1105    Subjective  Pt reports to PT with complaints of Lt hip pain lasting 6 months.  Pt has done PT recently with 2 different therapists-water therapy at the [facility] and traditional therapy prior to that. 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 MD has referred pt to see neurosurgeon.      Limitations  Sitting;Walking    How long can you sit comfortably?  this varies    How long can you stand comfortably?  no pain    How long can you walk  comfortably?  <10 minutes    Diagnostic tests  MRI of lumbar spine: facet degeneration and stenosis L5-S1    Patient Stated Goals  reduce Lt LE pain    Currently in Pain?  Yes    Pain Score  7    1-2/10 today   Pain Location  Leg    Pain Orientation  Left    Pain Descriptors / Indicators  Aching    Pain Type  Chronic pain    Pain Onset  More than a month ago    Pain Frequency  Constant    Aggravating Factors   walking (causes limping), sitting, certain positions    Pain Relieving Factors  heat, pain medication         [facility] PT Assessment - 12/06/17 0001      Assessment   Medical Diagnosis  Lt hip pain    Referring Provider (PT)  Karlton Lemon, MD    Onset Date/Surgical Date  03/08/17    Next MD Visit  none with [place].  Will see Neurosurgeon- next week    Prior Therapy  2 rounds of PT recently prior to MRI      Precautions   Precautions  None      Restrictions   Weight Bearing Restrictions  No  Balance Screen   Has the patient fallen in the past 6 months  No    Has the patient had a decrease in activity level because of a fear of falling?   No    Is the patient reluctant to leave their home because of a fear of falling?   No      Home Environment   Living Environment  Private residence    Living Arrangements  Spouse/significant other    Type of [place]      Prior Function   Level of Independence  Independent      Cognition   Overall Cognitive Status  Within Functional Limits for tasks assessed      Observation/Other Assessments   Focus on Therapeutic Outcomes (FOTO)   43% limitation      Posture/Postural Control   Posture/Postural Control  Postural limitations    Postural Limitations  Decreased lumbar lordosis;Forward head      ROM / Strength   AROM / PROM / Strength  AROM;PROM;Strength      AROM   Overall AROM   Deficits    Overall AROM Comments  bil hip flexibility limited by >75% into IR and 50% into ER and 50% SLR.  Lumbar A/ROM is WFLs  without pain in the lumbar spine or Lt LE      PROM   Overall PROM   Deficits    Overall PROM Comments  see above      Strength   Overall Strength  Deficits    Overall Strength Comments  4+/5 bil hip strength, 4/5 core strength      Palpation   Spinal mobility  reduced lumbar spinal mobility without pain    Palpation comment  no pain with palpation today      Transfers   Transfers  Sit to Stand;Stand to Sit    Sit to Stand  7: Independent      Ambulation/Gait   Gait Pattern  Within Functional Limits                Objective measurements completed on examination: See above findings.              PT Education - 12/06/17 1142    Education Details   Access Code: [email or web address]     Person(s) Educated  Patient    Methods  Explanation;Demonstration;Handout    Comprehension  Verbalized understanding;Returned demonstration       PT Short Term Goals - 12/06/17 1213      PT SHORT TERM GOAL #1   Title  be independent in initial HEP    Time  4    Period  Weeks    Status  New    Target Date  01/03/18      PT SHORT TERM GOAL #2   Title  report a 25% reduction in Lt LE pain with standing and walking     Time  4    Period  Weeks    Status  New    Target Date  01/03/18        PT Long Term Goals - 12/06/17 1215      PT LONG TERM GOAL #1   Title  be independent in advanced HEP    Time  8    Period  Weeks    Status  New    Target Date  01/31/18      PT LONG TERM GOAL #2   Title  report a 50% reduction in  Lt LE pain with standing and walking    Time  8    Period  Weeks    Status  New    Target Date  01/31/18      PT LONG TERM GOAL #3   Title  verbalize and demonstrate body mechanics modifications for lumbar protection with daily tasks    Time  8    Period  Weeks    Status  New    Target Date  01/31/18      PT LONG TERM GOAL #4   Title  reduce Lt LE pain to walk for 15 minutes without limitation    Time  8    Period  Weeks    Status  New     Target Date  01/31/18      PT LONG TERM GOAL #5   Title  reduce FOTO to < or = to 35% limitation    Time  8    Period  Weeks    Status  New    Target Date  01/31/18             Plan - 12/06/17 1245    Clinical Impression Statement  Pt reports to PT with Lt LE pain lasting > 6 months that began without incident or injury.  Pt has had PT at 2 locations to address Lt hip pain.  Pt had recent MRI that showed spinal stenosis at L5-S1 and has had 2 injections over the past 2 months without relief.  Pt will see neurosurgeon next week.  Pt reports up to 7/10 Lt LE pain that limits walking to < 10 minutes overall.  Pt demonstrates significant hip A/ROM and P/ROM limitations, core weakness and reduced spinal segmental mobility in the lumbar spine.  Pt will benefit from skilled PT to address core strength, hip flexibility, dry needling to lumbar spine, flexion based exercise, and lumbar traction to address lumbar radiculopathy.    History and Personal Factors relevant to plan of care:  none    Clinical Presentation  Stable    Clinical Presentation due to:  lumbar radiculopathy x 8 months    Clinical Decision Making  Low    Rehab Potential  Good    PT Frequency  2x / week    PT Duration  8 weeks    PT Treatment/Interventions  ADLs/Self Care Home Management;Cryotherapy;Electrical Stimulation;Traction;Moist Heat;Functional mobility training;Therapeutic activities;Therapeutic exercise;Patient/family education;Neuromuscular re-education;Manual techniques;Passive range of motion;Taping;Dry needling    PT Next Visit Plan  lumbar traction, dry needling to lumbar multifidi, core strength, flexion based exercise    PT Home Exercise Plan  Access Code: [email or web address]     Consulted and Agree with Plan of Care  Patient       Patient will benefit from skilled therapeutic intervention in order to improve the following deficits and impairments:  Pain, Improper body mechanics, Impaired flexibility, Decreased activity  tolerance, Decreased range of motion, Decreased strength, Postural dysfunction  Visit Diagnosis: Pain in left hip - Plan: PT plan of care cert/re-cert  Chronic left-sided low back pain with left-sided sciatica - Plan: PT plan of care cert/re-cert  Stiffness of left hip, not elsewhere classified - Plan: PT plan of care cert/re-cert  Stiffness of right hip, not elsewhere classified - Plan: PT plan of care cert/re-cert     Problem List Patient Active Problem List   Diagnosis Date Noted  . 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 Left hip pain 09/13/2016  . Family history of cardiovascular disease 05/21/2015  .  Obesity   . Hyperlipidemia   . A-fib ([place])   . Atrial flutter ([place])   . Insomnia   . Heart disease   . Back pain   . Cardiomyopathy [facility])     Sigurd Sos, PT 12/06/17 12:56 PM  [facility] [address] [place], [place], [address] Phone: [phone]   Fax:  [phone]  Name: GIAVONNI FONDER MRN: [id number] Date of Birth: 11/14/1950

## 2017-12-08 ENCOUNTER — Ambulatory Visit: Payer: PPO | Admitting: Physical Therapy

## 2017-12-08 DIAGNOSIS — M25651 Stiffness of right hip, not elsewhere classified: Secondary | ICD-10-CM

## 2017-12-08 DIAGNOSIS — M4150 Other secondary scoliosis, site unspecified: Secondary | ICD-10-CM | POA: Diagnosis not present

## 2017-12-08 DIAGNOSIS — M25552 Pain in left hip: Secondary | ICD-10-CM | POA: Diagnosis not present

## 2017-12-08 DIAGNOSIS — M5442 Lumbago with sciatica, left side: Secondary | ICD-10-CM

## 2017-12-08 DIAGNOSIS — G8929 Other chronic pain: Secondary | ICD-10-CM

## 2017-12-08 DIAGNOSIS — M25652 Stiffness of left hip, not elsewhere classified: Secondary | ICD-10-CM

## 2017-12-08 DIAGNOSIS — M5440 Lumbago with sciatica, unspecified side: Secondary | ICD-10-CM | POA: Diagnosis not present

## 2017-12-08 NOTE — Therapy (Signed)
[facility] [address] [place], [place], [address] Phone: [phone]   Fax:  [phone]  Physical Therapy Treatment  Patient Details  Name: Gary [name] MRN: [id number] Date of Birth: March 25, 1950 Referring Provider (PT): Karlton Lemon, MD   Encounter Date: 12/08/2017  PT End of Session - 12/08/17 1152    Visit Number  2    Date for PT Re-Evaluation  01/31/18    PT Start Time  0845    PT Stop Time  0940   last 10 min on heat   PT Time Calculation (min)  55 min    Activity Tolerance  Patient tolerated treatment well    Behavior During Therapy  [facility] for tasks assessed/performed       Past Medical History:  Diagnosis Date  . Atrial flutter ([place]) 2007   s/p CTI ablation by Dr Lovena Le  . Back pain   . Hyperlipidemia   . Hypertension   . Insomnia   . NICM (nonischemic cardiomyopathy) ([place])    a. 2005 - tachy mediated. normalized after return of NSR.  [name] Obesity   . Paroxysmal atrial fibrillation [facility])     Past Surgical History:  Procedure Laterality Date  . atrial flutter ablation  06/10/2005   CTI ablation by Dr Lovena Le for atrial flutter  . CARDIOVERSION N/A 07/20/2016   Procedure: CARDIOVERSION;  Surgeon: Minus Breeding, MD;  Location: [facility] ENDOSCOPY;  Service: Cardiovascular;  Laterality: N/A;  . NO PAST SURGERIES      There were no vitals filed for this visit.  Subjective Assessment - 12/08/17 0953    Subjective  Pt relays he will see surgeon today about his back/hip. He relays pain is 2/10 today but it varries a lot though the day.     Currently in Pain?  Yes    Pain Score  2     Pain Location  Leg    Pain Orientation  Left    Pain Descriptors / Indicators  Aching                       [facility] Adult PT Treatment/Exercise - 12/08/17 0001      Exercises   Exercises  Lumbar      Lumbar Exercises: Stretches   Single Knee to Chest Stretch  Left;2 reps;30 seconds    Double Knee to  Chest Stretch  3 reps;30 seconds    Pelvic Tilt  Other (comment)   5 sec X 20 posterior   Other Lumbar Stretch Exercise  supine double KTC with feet on Pball 5 sec 2 sets of 10, then KTC with rotation to Rt with PT assisting rotation 5 sec X 10    Other Lumbar Stretch Exercise  sitting lumbar P-ball roll outs fwd 10 sec X 10 and diagonal to Rt 10 sec X 5      Lumbar Exercises: Aerobic   Recumbent Bike  5 min L2      Lumbar Exercises: Machines for Strengthening   Leg Press  30 lbs 2X15      Modalities   Modalities  Moist Heat      Moist Heat Therapy   Number Minutes Moist Heat  10 Minutes    Moist Heat Location  Lumbar Spine             PT Education - 12/08/17 1152    Education Details  rationale for repeated flexion for stenosis. HEP review    Person(s) Educated  Patient    Methods  Explanation;Demonstration;Verbal cues    Comprehension  Verbalized understanding;Returned demonstration       PT Short Term Goals - 12/06/17 1213      PT SHORT TERM GOAL #1   Title  be independent in initial HEP    Time  4    Period  Weeks    Status  New    Target Date  01/03/18      PT SHORT TERM GOAL #2   Title  report a 25% reduction in Lt LE pain with standing and walking     Time  4    Period  Weeks    Status  New    Target Date  01/03/18        PT Long Term Goals - 12/06/17 1215      PT LONG TERM GOAL #1   Title  be independent in advanced HEP    Time  8    Period  Weeks    Status  New    Target Date  01/31/18      PT LONG TERM GOAL #2   Title  report a 50% reduction in Lt LE pain with standing and walking    Time  8    Period  Weeks    Status  New    Target Date  01/31/18      PT LONG TERM GOAL #3   Title  verbalize and demonstrate body mechanics modifications for lumbar protection with daily tasks    Time  8    Period  Weeks    Status  New    Target Date  01/31/18      PT LONG TERM GOAL #4   Title  reduce Lt LE pain to walk for 15 minutes without  limitation    Time  8    Period  Weeks    Status  New    Target Date  01/31/18      PT LONG TERM GOAL #5   Title  reduce FOTO to < or = to 35% limitation    Time  8    Period  Weeks    Status  New    Target Date  01/31/18            Plan - 12/08/17 1153    Clinical Impression Statement  Session focused on repeted flexion and knee to chest due to stenosis. He came in with leg pain but this was more localized after session so possible centralization occuring. PT instructed to trial flexion based program and avoid any extension at this time and to monitor symptoms of centralization vs peripheralization. He will also meet with surgeon today so ask pt about this next visit.    Rehab Potential  Good    PT Frequency  2x / week    PT Treatment/Interventions  ADLs/Self Care Home Management;Cryotherapy;Electrical Stimulation;Traction;Moist Heat;Functional mobility training;Therapeutic activities;Therapeutic exercise;Patient/family education;Neuromuscular re-education;Manual techniques;Passive range of motion;Taping;Dry needling    PT Next Visit Plan  lumbar traction, dry needling to lumbar multifidi, core strength, flexion based exercise    PT Home Exercise Plan  Access Code: [email or web address]     Consulted and Agree with Plan of Care  Patient       Patient will benefit from skilled therapeutic intervention in order to improve the following deficits and impairments:  Pain, Improper body mechanics, Impaired flexibility, Decreased activity tolerance, Decreased range of motion, Decreased strength, Postural dysfunction  Visit Diagnosis: Pain in left  hip  Chronic left-sided low back pain with left-sided sciatica  Stiffness of left hip, not elsewhere classified  Stiffness of right hip, not elsewhere classified     Problem List Patient Active Problem List   Diagnosis Date Noted  . 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 Left hip pain 09/13/2016  . Family history of cardiovascular disease  05/21/2015  . Obesity   . Hyperlipidemia   . A-fib ([place])   . Atrial flutter ([place])   . Insomnia   . Heart disease   . Back pain   . Cardiomyopathy [facility])     Debbe Odea, PT,DPT 12/08/2017, 11:57 AM  [facility] [address] [place], [place], [address] Phone: [phone]   Fax:  [phone]  Name: Gary [name] MRN: [id number] Date of Birth: [birth year]

## 2017-12-12 ENCOUNTER — Encounter: Payer: Self-pay | Admitting: Physical Therapy

## 2017-12-12 ENCOUNTER — Ambulatory Visit: Payer: PPO | Admitting: Physical Therapy

## 2017-12-12 DIAGNOSIS — M25651 Stiffness of right hip, not elsewhere classified: Secondary | ICD-10-CM

## 2017-12-12 DIAGNOSIS — M25552 Pain in left hip: Secondary | ICD-10-CM

## 2017-12-12 DIAGNOSIS — G8929 Other chronic pain: Secondary | ICD-10-CM

## 2017-12-12 DIAGNOSIS — M5442 Lumbago with sciatica, left side: Secondary | ICD-10-CM

## 2017-12-12 DIAGNOSIS — M25652 Stiffness of left hip, not elsewhere classified: Secondary | ICD-10-CM

## 2017-12-12 NOTE — Therapy (Signed)
[facility] [address], [place], [place], [address] Phone: [phone]   Fax:  [phone]  Physical Therapy Treatment  Patient Details  Name: Gary [name] MRN: [id number] Date of Birth: Aug 09, 1950 Referring Provider (PT): Karlton Lemon, MD   Encounter Date: 12/12/2017  PT End of Session - 12/12/17 0844    Visit Number  3    Date for PT Re-Evaluation  01/31/18    PT Start Time  0845    PT Stop Time  0940    PT Time Calculation (min)  55 min    Activity Tolerance  Patient tolerated treatment well    Behavior During Therapy  [facility] for tasks assessed/performed       Past Medical History:  Diagnosis Date  . Atrial flutter ([place]) 2007   s/p CTI ablation by Dr Lovena Le  . Back pain   . Hyperlipidemia   . Hypertension   . Insomnia   . NICM (nonischemic cardiomyopathy) ([place])    a. 2005 - tachy mediated. normalized after return of NSR.  [name] Obesity   . Paroxysmal atrial fibrillation [facility])     Past Surgical History:  Procedure Laterality Date  . atrial flutter ablation  06/10/2005   CTI ablation by Dr Lovena Le for atrial flutter  . CARDIOVERSION N/A 07/20/2016   Procedure: CARDIOVERSION;  Surgeon: Minus Breeding, MD;  Location: [facility] ENDOSCOPY;  Service: Cardiovascular;  Laterality: N/A;  . NO PAST SURGERIES      There were no vitals filed for this visit.  Subjective Assessment - 12/12/17 0845    Subjective  Saw Dr Saintclair Halsted, pt reports he has a spur at L4-5. Continue with PT for now, surgery is a possibility but he also has scoliosis that complicates the matter.     Currently in Pain?  No/denies    Multiple Pain Sites  No                       [facility] Adult PT Treatment/Exercise - 12/12/17 0001      Lumbar Exercises: Stretches   Single Knee to Chest Stretch  Right;Left;1 rep;30 seconds    Other Lumbar Stretch Exercise  Seated ham stretch:       Lumbar Exercises: Aerobic   Recumbent Bike  L2 8 min    PTA present to discuss the MD visit     Lumbar Exercises: Supine   Clam  10 reps    Bent Knee Raise  5 reps    Pelvic Tilt  10 reps   Add to HEP     Moist Heat Therapy   Number Minutes Moist Heat  15 Minutes    Moist Heat Location  Lumbar Spine             PT Education - 12/12/17 0928    Education Details  Level 1 supine core activation exercises for HEP    Person(s) Educated  Patient    Methods  Explanation;Demonstration;Verbal cues;Handout    Comprehension  Returned demonstration;Verbalized understanding       PT Short Term Goals - 12/06/17 1213      PT SHORT TERM GOAL #1   Title  be independent in initial HEP    Time  4    Period  Weeks    Status  New    Target Date  01/03/18      PT SHORT TERM GOAL #2   Title  report a 25% reduction in Lt LE pain with  standing and walking     Time  4    Period  Weeks    Status  New    Target Date  01/03/18        PT Long Term Goals - 12/06/17 1215      PT LONG TERM GOAL #1   Title  be independent in advanced HEP    Time  8    Period  Weeks    Status  New    Target Date  01/31/18      PT LONG TERM GOAL #2   Title  report a 50% reduction in Lt LE pain with standing and walking    Time  8    Period  Weeks    Status  New    Target Date  01/31/18      PT LONG TERM GOAL #3   Title  verbalize and demonstrate body mechanics modifications for lumbar protection with daily tasks    Time  8    Period  Weeks    Status  New    Target Date  01/31/18      PT LONG TERM GOAL #4   Title  reduce Lt LE pain to walk for 15 minutes without limitation    Time  8    Period  Weeks    Status  New    Target Date  01/31/18      PT LONG TERM GOAL #5   Title  reduce FOTO to < or = to 35% limitation    Time  8    Period  Weeks    Status  New    Target Date  01/31/18            Plan - 12/12/17 6301    Clinical Impression Statement  Pt has large spur L4-5 with scoliosis per his report and meeting with MD last week. MD  would like him to continue with PT and see how it goes but surgery is a possibility. Pt would like to avoid surgery. He presented today with minor symptoms along the outter LT lateral leg. This did increase some with his mat exercises, but nothing too significant. Pt did have some LT low back pain throughout the session but again nothing significant. Today level 1 core stabilizations were performed and given for HEP progression, he is independnet and compliant with his initial HEP.     Rehab Potential  Good    PT Frequency  2x / week    PT Duration  8 weeks    PT Treatment/Interventions  ADLs/Self Care Home Management;Cryotherapy;Electrical Stimulation;Traction;Moist Heat;Functional mobility training;Therapeutic activities;Therapeutic exercise;Patient/family education;Neuromuscular re-education;Manual techniques;Passive range of motion;Taping;Dry needling    PT Next Visit Plan  dry needling to lumbar multifidi, core strength, flexion based exercise    PT Home Exercise Plan  Access Code: [email or web address]     Consulted and Agree with Plan of Care  Patient       Patient will benefit from skilled therapeutic intervention in order to improve the following deficits and impairments:  Pain, Improper body mechanics, Impaired flexibility, Decreased activity tolerance, Decreased range of motion, Decreased strength, Postural dysfunction  Visit Diagnosis: Pain in left hip  Chronic left-sided low back pain with left-sided sciatica  Stiffness of left hip, not elsewhere classified  Stiffness of right hip, not elsewhere classified     Problem List Patient Active Problem List   Diagnosis Date Noted  . 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 Left hip pain 09/13/2016  .  Family history of cardiovascular disease 05/21/2015  . Obesity   . Hyperlipidemia   . A-fib ([place])   . Atrial flutter ([place])   . Insomnia   . Heart disease   . Back pain   . Cardiomyopathy ([place])     Karelly Dewalt, PTA 12/12/2017,  9:28 AM  [facility] [address] [place], [place], [address] Phone: [phone]   Fax:  [phone]  Name: Gary [name] MRN: [id number] Date of Birth: 1950-07-02  Access Code: [email or web address]  URL: [email or web address]  Date: 12/12/2017  Prepared by: Myrene Galas   Exercises  Hooklying Single Knee to Chest - 3 reps - 1 sets - 20 hold - 3x daily - 7x weekly  Seated Hamstring Stretch - 3 reps - 1 sets - 20 hold - 3x daily - 7x weekly  Supine Double Knee to Chest - 3 reps - 20 hold - 3x daily - 7x weekly  Supine Pelvic Tilt - 10 reps - 1 sets - 3 hold - 2x daily - 7x weekly  Supine March - 10 reps - 3 sets - 1x daily - 7x weekly  Supine Transversus Abdominis Bracing with Double Leg Fallout - 10 reps - 1 sets - 2x daily - 7x weekly

## 2017-12-13 ENCOUNTER — Encounter (HOSPITAL_COMMUNITY)
Admission: RE | Disposition: A | Discharge status: Home / Self Care | Payer: Self-pay | Source: Ambulatory Visit | Attending: Cardiovascular Disease

## 2017-12-13 ENCOUNTER — Ambulatory Visit (HOSPITAL_COMMUNITY)
Admission: RE | Admit: 2017-12-13 | Discharge: 2017-12-13 | Disposition: A | Discharge status: Home / Self Care | Payer: PPO | Source: Ambulatory Visit | Attending: Cardiovascular Disease | Admitting: Cardiovascular Disease

## 2017-12-13 ENCOUNTER — Ambulatory Visit (HOSPITAL_COMMUNITY): Payer: PPO | Admitting: Certified Registered"

## 2017-12-13 ENCOUNTER — Encounter (HOSPITAL_COMMUNITY): Payer: Self-pay | Admitting: *Deleted

## 2017-12-13 DIAGNOSIS — I10 Essential (primary) hypertension: Secondary | ICD-10-CM | POA: Diagnosis not present

## 2017-12-13 DIAGNOSIS — Z7901 Long term (current) use of anticoagulants: Secondary | ICD-10-CM | POA: Diagnosis not present

## 2017-12-13 DIAGNOSIS — Z9889 Other specified postprocedural states: Secondary | ICD-10-CM | POA: Diagnosis not present

## 2017-12-13 DIAGNOSIS — Z87891 Personal history of nicotine dependence: Secondary | ICD-10-CM | POA: Diagnosis not present

## 2017-12-13 DIAGNOSIS — I4892 Unspecified atrial flutter: Secondary | ICD-10-CM | POA: Insufficient documentation

## 2017-12-13 DIAGNOSIS — Z880 Allergy status to penicillin: Secondary | ICD-10-CM | POA: Diagnosis not present

## 2017-12-13 DIAGNOSIS — E785 Hyperlipidemia, unspecified: Secondary | ICD-10-CM | POA: Insufficient documentation

## 2017-12-13 DIAGNOSIS — Z8249 Family history of ischemic heart disease and other diseases of the circulatory system: Secondary | ICD-10-CM | POA: Insufficient documentation

## 2017-12-13 DIAGNOSIS — E669 Obesity, unspecified: Secondary | ICD-10-CM | POA: Insufficient documentation

## 2017-12-13 DIAGNOSIS — I4819 Other persistent atrial fibrillation: Secondary | ICD-10-CM

## 2017-12-13 DIAGNOSIS — I4891 Unspecified atrial fibrillation: Secondary | ICD-10-CM | POA: Diagnosis not present

## 2017-12-13 DIAGNOSIS — I428 Other cardiomyopathies: Secondary | ICD-10-CM | POA: Diagnosis not present

## 2017-12-13 DIAGNOSIS — G4700 Insomnia, unspecified: Secondary | ICD-10-CM | POA: Diagnosis not present

## 2017-12-13 DIAGNOSIS — Z79899 Other long term (current) drug therapy: Secondary | ICD-10-CM | POA: Insufficient documentation

## 2017-12-13 HISTORY — PX: CARDIOVERSION: SHX1299

## 2017-12-13 SURGERY — CARDIOVERSION
Anesthesia: General

## 2017-12-13 MED ORDER — PROPOFOL 10 MG/ML IV BOLUS
INTRAVENOUS | Status: DC | PRN
  Administered 2017-12-13 (×2): 50 mg via INTRAVENOUS

## 2017-12-13 MED ORDER — LIDOCAINE 2% (20 MG/ML) 5 ML SYRINGE
INTRAMUSCULAR | Status: DC | PRN
  Administered 2017-12-13: 60 mg via INTRAVENOUS

## 2017-12-13 MED ORDER — SODIUM CHLORIDE 0.9 % IV SOLN
INTRAVENOUS | Status: DC | PRN
  Administered 2017-12-13: 14:00:00 via INTRAVENOUS

## 2017-12-13 NOTE — CV Procedure (Signed)
    Cardioversion Note  Gary [name] [id number] 12/25/1950  Procedure: DC Cardioversion Indications: atrial fib   Procedure Details Consent: Obtained Time Out: Verified patient identification, verified procedure, site/side was marked, verified correct patient position, special equipment/implants available, Radiology Safety Procedures followed,  medications/allergies/relevent history reviewed, required imaging and test results available.  Performed  The patient has been on adequate anticoagulation.  The patient received IV Lidocaine 60 mg followed by Propofol 100 mg IV  for sedation.  Synchronous cardioversion was performed at  120, 200  joules.  The cardioversion was successful .     Complications: No apparent complications Patient did tolerate procedure well.   Thayer Headings, Brooke Bonito., MD, [facility] 12/13/2017, 2:26 PM

## 2017-12-13 NOTE — Anesthesia Preprocedure Evaluation (Addendum)
Anesthesia Evaluation  Patient identified by MRN, date of birth, ID band Patient awake    Reviewed: Allergy & Precautions, NPO status , Patient's Chart, lab work & pertinent test results, reviewed documented beta blocker date and time   History of Anesthesia Complications Negative for: history of anesthetic complications  Airway Mallampati: III  TM Distance: >3 FB Neck ROM: Full    Dental no notable dental hx.    Pulmonary neg pulmonary ROS, former smoker,    Pulmonary exam normal        Cardiovascular hypertension, Pt. on home beta blockers and Pt. on medications + dysrhythmias Atrial Fibrillation  Rhythm:Irregular Rate:Normal  TTE 11/24/17: Study Conclusions - Left ventricle: The cavity size was at the upper limits of   normal. Systolic function was normal. The estimated ejection   fraction was in the range of 55% to 60%. Wall motion was normal;   there were no regional wall motion abnormalities. The study was   not technically sufficient to allow evaluation of LV diastolic   dysfunction due to atrial fibrillation. - Aortic valve: Poorly visualized. Trileaflet; normal thickness,   mildly calcified leaflets. - Mitral valve: Calcified annulus. There was trivial regurgitation. - Left atrium: The atrium was mildly dilated. - Tricuspid valve: There was trivial regurgitation.    Neuro/Psych negative neurological ROS  negative psych ROS   GI/Hepatic negative GI ROS, Neg liver ROS,   Endo/Other  negative endocrine ROS  Renal/GU negative Renal ROS  negative genitourinary   Musculoskeletal negative musculoskeletal ROS (+)   Abdominal   Peds  Hematology negative hematology ROS (+)   Anesthesia Other Findings   Reproductive/Obstetrics                            Anesthesia Physical Anesthesia Plan  ASA: III  Anesthesia Plan: General   Post-op Pain Management:    Induction:  Intravenous  PONV Risk Score and Plan: 2 and Propofol infusion, TIVA and Treatment may vary due to age or medical condition  Airway Management Planned: Mask  Additional Equipment: None  Intra-op Plan:   Post-operative Plan:   Informed Consent: I have reviewed the patients History and Physical, chart, labs and discussed the procedure including the risks, benefits and alternatives for the proposed anesthesia with the patient or authorized representative who has indicated his/her understanding and acceptance.     Plan Discussed with:   Anesthesia Plan Comments:        Anesthesia Quick Evaluation

## 2017-12-13 NOTE — Transfer of Care (Signed)
Immediate Anesthesia Transfer of Care Note  Patient: Gary [name]  Procedure(s) Performed: CARDIOVERSION (N/A )  Patient Location: PACU  Anesthesia Type:General  Level of Consciousness: drowsy  Airway & Oxygen Therapy: Patient Spontanous Breathing and Patient connected to face mask oxygen  Post-op Assessment: Report given to RN and Post -op Vital signs reviewed and stable  Post vital signs: Reviewed and stable  Last Vitals:  Vitals Value Taken Time  BP 130/72 12/13/2017  2:18 PM  Temp    Pulse 67 12/13/2017  2:20 PM  Resp 19 12/13/2017  2:20 PM  SpO2 99 % 12/13/2017  2:20 PM  Vitals shown include unvalidated device data.  Last Pain:  Vitals:   12/13/17 1325  TempSrc: Oral  PainSc: 0-No pain         Complications: No apparent anesthesia complications

## 2017-12-13 NOTE — Interval H&P Note (Signed)
History and Physical Interval Note:  12/13/2017 1:36 PM  Gary [name]  has presented today for surgery, with the diagnosis of AFIB  The various methods of treatment have been discussed with the patient and family. After consideration of risks, benefits and other options for treatment, the patient has consented to  Procedure(s): CARDIOVERSION (N/A) as a surgical intervention .  The patient's history has been reviewed, patient examined, no change in status, stable for surgery.  I have reviewed the patient's chart and labs.  Questions were answered to the patient's satisfaction.     Mertie Moores

## 2017-12-13 NOTE — Discharge Instructions (Signed)
Electrical Cardioversion, Care After °This sheet gives you information about how to care for yourself after your procedure. Your health care provider may also give you more specific instructions. If you have problems or questions, contact your health care provider. °What can I expect after the procedure? °After the procedure, it is common to have: °· Some redness on the skin where the shocks were given. ° °Follow these instructions at home: °· Do not drive for 24 hours if you were given a medicine to help you relax (sedative). °· Take over-the-counter and prescription medicines only as told by your health care provider. °· Ask your health care provider how to check your pulse. Check it often. °· Rest for 48 hours after the procedure or as told by your health care provider. °· Avoid or limit your caffeine use as told by your health care provider. °Contact a health care provider if: °· You feel like your heart is beating too quickly or your pulse is not regular. °· You have a serious muscle cramp that does not go away. °Get help right away if: °· You have discomfort in your chest. °· You are dizzy or you feel faint. °· You have trouble breathing or you are short of breath. °· Your speech is slurred. °· You have trouble moving an arm or leg on one side of your body. °· Your fingers or toes turn cold or blue. °This information is not intended to replace advice given to you by your health care provider. Make sure you discuss any questions you have with your health care provider. °Document Released: 11/01/2012 Document Revised: 08/15/2015 Document Reviewed: 07/18/2015 °Elsevier Interactive Patient Education © 2018 [employer] ° °

## 2017-12-13 NOTE — Anesthesia Postprocedure Evaluation (Signed)
Anesthesia Post Note  Patient: Gary [name]  Procedure(s) Performed: CARDIOVERSION (N/A )     Patient location during evaluation: PACU Anesthesia Type: General Level of consciousness: awake and alert Pain management: pain level controlled Vital Signs Assessment: post-procedure vital signs reviewed and stable Respiratory status: spontaneous breathing, nonlabored ventilation and respiratory function stable Cardiovascular status: blood pressure returned to baseline and stable Postop Assessment: no apparent nausea or vomiting Anesthetic complications: no    Last Vitals:  Vitals:   12/13/17 1420 12/13/17 1430  BP: 119/64 112/61  Pulse: 70 71  Resp: 20 20  Temp: 36.5 C   SpO2: 99% 93%    Last Pain:  Vitals:   12/13/17 1430  TempSrc:   PainSc: 0-No pain                 Lidia Collum

## 2017-12-14 ENCOUNTER — Encounter: Payer: Self-pay | Admitting: Physical Therapy

## 2017-12-14 ENCOUNTER — Ambulatory Visit: Payer: PPO | Admitting: Physical Therapy

## 2017-12-14 DIAGNOSIS — G8929 Other chronic pain: Secondary | ICD-10-CM

## 2017-12-14 DIAGNOSIS — M25652 Stiffness of left hip, not elsewhere classified: Secondary | ICD-10-CM

## 2017-12-14 DIAGNOSIS — M25651 Stiffness of right hip, not elsewhere classified: Secondary | ICD-10-CM

## 2017-12-14 DIAGNOSIS — M25552 Pain in left hip: Secondary | ICD-10-CM

## 2017-12-14 DIAGNOSIS — M5442 Lumbago with sciatica, left side: Secondary | ICD-10-CM

## 2017-12-14 NOTE — Therapy (Signed)
[facility] [address], [place], [place], [address] Phone: [phone]   Fax:  [phone]  Physical Therapy Treatment  Patient Details  Name: Gary [name] MRN: [id number] Date of Birth: 08-16-1950 Referring Provider (PT): Karlton Lemon, MD   Encounter Date: 12/14/2017  PT End of Session - 12/14/17 1401    Visit Number  4    Date for PT Re-Evaluation  01/31/18    PT Start Time  1401    PT Stop Time  1450    PT Time Calculation (min)  49 min    Activity Tolerance  Patient tolerated treatment well    Behavior During Therapy  [name] for tasks assessed/performed       Past Medical History:  Diagnosis Date  . Atrial flutter ([place]) 2007   s/p CTI ablation by Dr Lovena Le  . Back pain   . Hyperlipidemia   . Hypertension   . Insomnia   . NICM (nonischemic cardiomyopathy) ([place])    a. 2005 - tachy mediated. normalized after return of NSR.  [name] Obesity   . Paroxysmal atrial fibrillation [facility])     Past Surgical History:  Procedure Laterality Date  . atrial flutter ablation  06/10/2005   CTI ablation by Dr Lovena Le for atrial flutter  . CARDIOVERSION N/A 07/20/2016   Procedure: CARDIOVERSION;  Surgeon: Minus Breeding, MD;  Location: [facility] ENDOSCOPY;  Service: Cardiovascular;  Laterality: N/A;  . NO PAST SURGERIES      There were no vitals filed for this visit.  Subjective Assessment - 12/14/17 1406    Subjective  I had a cardioverison. My back/hip is about the same. When I "feel it" in my back I stop or pull back on my stretching.    Currently in Pain?  Yes    Pain Score  2     Pain Location  --   LT lateral LT leg   Pain Orientation  Left    Pain Descriptors / Indicators  Aching    Aggravating Factors   overoding it    Pain Relieving Factors  not much,     Multiple Pain Sites  No                       [facility] Adult PT Treatment/Exercise - 12/14/17 0001      Lumbar Exercises: Aerobic   Recumbent  Bike  L2 8 min   PTA present to discuss the MD visit     Lumbar Exercises: Supine   Clam  --   Green band 2x10, VC for core stabilization     Lumbar Exercises: Sidelying   Clam  Left;10 reps    Other Sidelying Lumbar Exercises  Modified side plank: lifting only feet. 5x 10 sec      Knee/Hip Exercises: Seated   Long Arc Quad  --   2x10, 2#, then 4# second set VC to hold 3 sec   Other Seated Knee/Hip Exercises  Green band horizontal abd 10x, diagonals 10x bil              PT Education - 12/14/17 1615    Education Details  Seated on the ball green band horizontal abd, diagonals, modified sideplank ( only lift feet)     Person(s) Educated  Patient    Methods  Explanation;Demonstration;Tactile cues;Verbal cues;Handout   VC/TC for lift of trunk   Comprehension  Returned demonstration;Verbalized understanding       PT Short Term Goals -  12/12/17 0928      PT SHORT TERM GOAL #1   Title  be independent in initial HEP    Time  4    Period  Weeks    Status  Achieved        PT Long Term Goals - 12/06/17 1215      PT LONG TERM GOAL #1   Title  be independent in advanced HEP    Time  8    Period  Weeks    Status  New    Target Date  01/31/18      PT LONG TERM GOAL #2   Title  report a 50% reduction in Lt LE pain with standing and walking    Time  8    Period  Weeks    Status  New    Target Date  01/31/18      PT LONG TERM GOAL #3   Title  verbalize and demonstrate body mechanics modifications for lumbar protection with daily tasks    Time  8    Period  Weeks    Status  New    Target Date  01/31/18      PT LONG TERM GOAL #4   Title  reduce Lt LE pain to walk for 15 minutes without limitation    Time  8    Period  Weeks    Status  New    Target Date  01/31/18      PT LONG TERM GOAL #5   Title  reduce FOTO to < or = to 35% limitation    Time  8    Period  Weeks    Status  New    Target Date  01/31/18            Plan - 12/14/17 1409    Clinical  Impression Statement  Pt had a difficult time finding one position to exercise in. We switched between supine, S/L and sitting to manage LTLE symptoms from worsening. Since pt has physioball at home we perfromed seated postural strength with green band  which he can replicate at home. We also found a modifed side plank in sidelying where pt only lifts his feet and holds for 10 sec. This did not increase LE symptoms and made him concentrate on his core.    Rehab Potential  Good    PT Frequency  2x / week    PT Duration  8 weeks    PT Treatment/Interventions  ADLs/Self Care Home Management;Cryotherapy;Electrical Stimulation;Traction;Moist Heat;Functional mobility training;Therapeutic activities;Therapeutic exercise;Patient/family education;Neuromuscular re-education;Manual techniques;Passive range of motion;Taping;Dry needling    PT Next Visit Plan  Sees PT next: discuss if pt wants to DN? See how he is doing with HEP and changing positions to manage his LE symptoms. Also, please assess spine scoliosis. LT side short?    PT Home Exercise Plan  Access Code: [email or web address]     Consulted and Agree with Plan of Care  Patient       Patient will benefit from skilled therapeutic intervention in order to improve the following deficits and impairments:  Pain, Improper body mechanics, Impaired flexibility, Decreased activity tolerance, Decreased range of motion, Decreased strength, Postural dysfunction  Visit Diagnosis: Pain in left hip  Chronic left-sided low back pain with left-sided sciatica  Stiffness of left hip, not elsewhere classified  Stiffness of right hip, not elsewhere classified     Problem List Patient Active Problem List   Diagnosis Date Noted  . 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 Left hip pain 09/13/2016  . Family history of cardiovascular disease 05/21/2015  . Obesity   . Hyperlipidemia   . A-fib ([place])   . Atrial flutter ([place])   . Insomnia   . Heart disease   . Back pain    . Cardiomyopathy ([place])     Maranda Marte, PTA 12/14/2017, 4:16 PM  [facility] [address], [place], [place], [address] Phone: [phone]   Fax:  [phone]  Name: Gary [name] MRN: [id number] Date of Birth: Jun 20, 1950  Access Code: [email or web address]  URL: [email or web address]  Date: 12/14/2017  Prepared by: Myrene Galas   Exercises  Hooklying Single Knee to Chest - 3 reps - 1 sets - 20 hold - 3x daily - 7x weekly  Seated Hamstring Stretch - 3 reps - 1 sets - 20 hold - 3x daily - 7x weekly  Supine Double Knee to Chest - 3 reps - 20 hold - 3x daily - 7x weekly  Supine Pelvic Tilt - 10 reps - 1 sets - 3 hold - 2x daily - 7x weekly  Supine March - 10 reps - 3 sets - 1x daily - 7x weekly  Supine Transversus Abdominis Bracing with Double Leg Fallout - 10 reps - 1 sets - 2x daily - 7x weekly  Seated Shoulder Horizontal Abduction with Resistance - Palms Down - 10 reps - 2 sets - 2 hold - 2x daily - 7x weekly  Seated Shoulder Diagonal Pulls with Resistance - 10 reps - 2 sets - 3 hold - 2x daily - 7x weekly

## 2017-12-19 ENCOUNTER — Ambulatory Visit: Payer: PPO

## 2017-12-19 DIAGNOSIS — G8929 Other chronic pain: Secondary | ICD-10-CM

## 2017-12-19 DIAGNOSIS — M5442 Lumbago with sciatica, left side: Secondary | ICD-10-CM

## 2017-12-19 DIAGNOSIS — M25552 Pain in left hip: Secondary | ICD-10-CM

## 2017-12-19 DIAGNOSIS — M25652 Stiffness of left hip, not elsewhere classified: Secondary | ICD-10-CM

## 2017-12-19 DIAGNOSIS — M25651 Stiffness of right hip, not elsewhere classified: Secondary | ICD-10-CM

## 2017-12-19 NOTE — Therapy (Addendum)
[facility] [address] [place], [place], [address] Phone: [phone]   Fax:  [phone]  Physical Therapy Treatment  Patient Details  Name: Gary [name] MRN: [id number] Date of Birth: 12-31-1950 Referring Provider (PT): Karlton Lemon, MD   Encounter Date: 12/19/2017  PT End of Session - 12/19/17 1530    Visit Number  5    Date for PT Re-Evaluation  01/31/18    PT Start Time  4315    PT Stop Time  1529    PT Time Calculation (min)  44 min    Activity Tolerance  Patient tolerated treatment well    Behavior During Therapy  [facility] for tasks assessed/performed       Past Medical History:  Diagnosis Date  . Atrial flutter ([place]) 2007   s/p CTI ablation by Dr Lovena Le  . Back pain   . Hyperlipidemia   . Hypertension   . Insomnia   . NICM (nonischemic cardiomyopathy) ([place])    a. 2005 - tachy mediated. normalized after return of NSR.  [name] Obesity   . Paroxysmal atrial fibrillation [facility])     Past Surgical History:  Procedure Laterality Date  . atrial flutter ablation  06/10/2005   CTI ablation by Dr Lovena Le for atrial flutter  . CARDIOVERSION N/A 07/20/2016   Procedure: CARDIOVERSION;  Surgeon: Minus Breeding, MD;  Location: [facility] ENDOSCOPY;  Service: Cardiovascular;  Laterality: N/A;  . CARDIOVERSION N/A 12/13/2017   Procedure: CARDIOVERSION;  Surgeon: Acie Fredrickson Wonda Cheng, MD;  Location: [facility] ENDOSCOPY;  Service: Cardiovascular;  Laterality: N/A;  . NO PAST SURGERIES      There were no vitals filed for this visit.  Subjective Assessment - 12/19/17 1443    Subjective  I am limping today.     Diagnostic tests  MRI of lumbar spine: facet degeneration and stenosis L5-S1, scoliosis    Patient Stated Goals  reduce Lt LE pain    Currently in Pain?  Yes    Pain Score  2     Pain Location  Leg    Pain Orientation  Left    Pain Descriptors / Indicators  Aching    Pain Type  Chronic pain    Pain Onset  More than a month  ago    Pain Frequency  Constant                       [facility] Adult PT Treatment/Exercise - 12/19/17 0001      Exercises   Exercises  Lumbar      Lumbar Exercises: Seated   Other Seated Lumbar Exercises  horizontal abduction and D2 on blue ball 2x10      Lumbar Exercises: Supine   Ab Set  10 reps    AB Set Limitations  Level 1 core stabilization      Modalities   Modalities  Traction      Traction   Type of Traction  Lumbar    Min (lbs)  50    Max (lbs)  90    Hold Time  60    Rest Time  30    Time  8   stopped early due to Lt lumbar pain            PT Education - 12/19/17 1507    Education Details   Access Code: [email or web address], extensive verbal review of all HEP with pt    Person(s) Educated  Patient    Methods  Explanation;Demonstration;Handout    Comprehension  Verbalized understanding;Returned demonstration       PT Short Term Goals - 12/12/17 0928      PT SHORT TERM GOAL #1   Title  be independent in initial HEP    Time  4    Period  Weeks    Status  Achieved        PT Long Term Goals - 12/06/17 1215      PT LONG TERM GOAL #1   Title  be independent in advanced HEP    Time  8    Period  Weeks    Status  New    Target Date  01/31/18      PT LONG TERM GOAL #2   Title  report a 50% reduction in Lt LE pain with standing and walking    Time  8    Period  Weeks    Status  New    Target Date  01/31/18      PT LONG TERM GOAL #3   Title  verbalize and demonstrate body mechanics modifications for lumbar protection with daily tasks    Time  8    Period  Weeks    Status  New    Target Date  01/31/18      PT LONG TERM GOAL #4   Title  reduce Lt LE pain to walk for 15 minutes without limitation    Time  8    Period  Weeks    Status  New    Target Date  01/31/18      PT LONG TERM GOAL #5   Title  reduce FOTO to < or = to 35% limitation    Time  8    Period  Weeks    Status  New    Target Date  01/31/18            Plan -  12/19/17 1517    Clinical Impression Statement  Pt had a lot of questions regarding HEP and PT spent a good portion of the session reviewing all HEP and instruction with how to build a routine each day.  Pt required minor verbal cues for abdominal bracing with all exercises.  Pt performed theraband exercises on the ball today for added core engagement.  PT did a trial of traction today to determine if this changes Lt LE radiculopathy. Lower tension and time on traction today due to Lt LBP when on traction.   Pt will continue to benefit from skilled PT for core and hip strength, flexion based exercise, traction and dry needling to address lumbar spine mobility and Lt LE radiculopathy.      Rehab Potential  Good    PT Frequency  2x / week    PT Duration  8 weeks    PT Treatment/Interventions  ADLs/Self Care Home Management;Cryotherapy;Electrical Stimulation;Traction;Moist Heat;Functional mobility training;Therapeutic activities;Therapeutic exercise;Patient/family education;Neuromuscular re-education;Manual techniques;Passive range of motion;Taping;Dry needling    PT Next Visit Plan  see how pt responds to traction (short session), try dry needling to lumbar multifidi, hip and core strength, flexion based exercise    PT Home Exercise Plan  Access Code: [email or web address]     Recommended Other Services  initial certification is signed    Consulted and Agree with Plan of Care  Patient       Patient will benefit from skilled therapeutic intervention in order to improve the following deficits and impairments:  Pain, Improper body mechanics, Impaired flexibility, Decreased activity  tolerance, Decreased range of motion, Decreased strength, Postural dysfunction  Visit Diagnosis: Pain in left hip  Chronic left-sided low back pain with left-sided sciatica  Stiffness of left hip, not elsewhere classified  Stiffness of right hip, not elsewhere classified     Problem List Patient Active Problem List   Diagnosis  Date Noted  . 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 Left hip pain 09/13/2016  . Family history of cardiovascular disease 05/21/2015  . Obesity   . Hyperlipidemia   . A-fib ([place])   . Atrial flutter ([place])   . Insomnia   . Heart disease   . Back pain   . Cardiomyopathy [facility])     Sigurd Sos, PT 12/19/17 3:36 PM  [facility] [address] [place], [place], [address] Phone: [phone]   Fax:  [phone]  Name: Gary [name] MRN: [id number] Date of Birth: 10/18/1950

## 2017-12-19 NOTE — Patient Instructions (Signed)
Access Code: [email or web address]  URL: [email or web address]  Date: 12/19/2017  Prepared by: Sigurd Sos   Exercises  Seated Long Arc Quad - 10 reps - 2 sets - 1-2x daily - 7x weekly

## 2017-12-21 ENCOUNTER — Ambulatory Visit (HOSPITAL_COMMUNITY)
Admission: RE | Admit: 2017-12-21 | Discharge: 2017-12-21 | Disposition: A | Discharge status: Home / Self Care | Payer: PPO | Source: Ambulatory Visit | Attending: Nurse Practitioner | Admitting: Nurse Practitioner

## 2017-12-21 ENCOUNTER — Encounter (HOSPITAL_COMMUNITY): Payer: Self-pay | Admitting: Nurse Practitioner

## 2017-12-21 VITALS — BP 106/68 | HR 80 | Ht 73.0 in | Wt 239.0 lb

## 2017-12-21 DIAGNOSIS — Z87891 Personal history of nicotine dependence: Secondary | ICD-10-CM | POA: Insufficient documentation

## 2017-12-21 DIAGNOSIS — I4819 Other persistent atrial fibrillation: Secondary | ICD-10-CM | POA: Diagnosis not present

## 2017-12-21 DIAGNOSIS — E785 Hyperlipidemia, unspecified: Secondary | ICD-10-CM | POA: Diagnosis not present

## 2017-12-21 DIAGNOSIS — Z79899 Other long term (current) drug therapy: Secondary | ICD-10-CM | POA: Insufficient documentation

## 2017-12-21 DIAGNOSIS — Z7901 Long term (current) use of anticoagulants: Secondary | ICD-10-CM | POA: Insufficient documentation

## 2017-12-21 DIAGNOSIS — Z6831 Body mass index (BMI) 31.0-31.9, adult: Secondary | ICD-10-CM | POA: Insufficient documentation

## 2017-12-21 DIAGNOSIS — I10 Essential (primary) hypertension: Secondary | ICD-10-CM | POA: Diagnosis not present

## 2017-12-21 DIAGNOSIS — I4891 Unspecified atrial fibrillation: Secondary | ICD-10-CM | POA: Diagnosis present

## 2017-12-21 DIAGNOSIS — Z8249 Family history of ischemic heart disease and other diseases of the circulatory system: Secondary | ICD-10-CM | POA: Insufficient documentation

## 2017-12-21 DIAGNOSIS — G4700 Insomnia, unspecified: Secondary | ICD-10-CM | POA: Diagnosis not present

## 2017-12-21 DIAGNOSIS — I428 Other cardiomyopathies: Secondary | ICD-10-CM | POA: Insufficient documentation

## 2017-12-21 DIAGNOSIS — Z880 Allergy status to penicillin: Secondary | ICD-10-CM | POA: Insufficient documentation

## 2017-12-21 DIAGNOSIS — E669 Obesity, unspecified: Secondary | ICD-10-CM | POA: Diagnosis not present

## 2017-12-21 NOTE — Progress Notes (Signed)
Primary Care Physician: [name], MD Referring Physician: [name] Cardiologist: [name] is a 67 y.o. male with a h/o afib on flecainide for years. He had breakthrough afib after stress over a family matter and too much alcohol. He had not been on anticoagulation and eliquis was restarted. TEE guided cardioversion was tried but the tube could not be passed. He was referred to [name]. He suggested several more weeks of anticoagulation and then cardioversion. This was done 07/20/16 and was successful and pt has been in SR since then.  He recently went back into afib after receiving a cortisone shot and oral prednisone for a back issue, "pinched nerve" which is still causing him pain  He is rate controlled. He missed anticoagulation for the back injection but has been back on anticoagulation since 10/25. He does have a chadsvasc score of 2(htn, age). He denies any alcohol use.Echo was recently updated without any significant change.He is taking 800 mg motrin at hs for the nerve pain so he can sleep. We discussed that this puts him at risk for GI bleed with his current use of eliquis.He is not drinking any alcohol currently.  F/u in afib clinic 11/27. He has successful cardioversion but unfortunately has returned to afib, rate controlled. He tolerates fairly well, but does have some  fatigue and lightheadedness with afib. We discussed other options, tikosyn vrs an ablation and he would like to further pursue ablation.   Today, he denies symptoms of palpitations, chest pain, shortness of breath, orthopnea, PND, lower extremity edema, dizziness, presyncope, syncope, or neurologic sequela. The patient is tolerating medications without difficulties and is otherwise without complaint today.   Past Medical History:  Diagnosis Date  . Atrial flutter ([place]) 2007   s/p CTI ablation by Dr Lovena Le  . Back pain   . Hyperlipidemia   . Hypertension   . Insomnia   . NICM  (nonischemic cardiomyopathy) ([place])    a. 2005 - tachy mediated. normalized after return of NSR.  [name] Obesity   . Paroxysmal atrial fibrillation [facility])    Past Surgical History:  Procedure Laterality Date  . atrial flutter ablation  06/10/2005   CTI ablation by Dr Lovena Le for atrial flutter  . CARDIOVERSION N/A 07/20/2016   Procedure: CARDIOVERSION;  Surgeon: Minus Breeding, MD;  Location: [facility] ENDOSCOPY;  Service: Cardiovascular;  Laterality: N/A;  . CARDIOVERSION N/A 12/13/2017   Procedure: CARDIOVERSION;  Surgeon: Thayer Headings, MD;  Location: [place];  Service: Cardiovascular;  Laterality: N/A;  . NO PAST SURGERIES      Current Outpatient Medications  Medication Sig Dispense Refill  . amitriptyline (ELAVIL) 50 MG tablet Take 50-75 mg by mouth at bedtime.     [name] apixaban (ELIQUIS) 5 MG TABS tablet Take 1 tablet (5 mg total) by mouth 2 (two) times daily. 180 tablet 1  . atorvastatin (LIPITOR) 10 MG tablet Take 10 mg by mouth every evening.     . diltiazem (CARTIA XT) 180 MG 24 hr capsule Take 1 capsule (180 mg total) by mouth daily. (Patient taking differently: Take 180 mg by mouth every evening. ) 90 capsule 3  . flecainide (TAMBOCOR) 100 MG tablet Take 1 tablet (100 mg total) by mouth 2 (two) times daily. 180 tablet 1  . metoprolol succinate (TOPROL-XL) 25 MG 24 hr tablet Take 1 tablet (25 mg total) by mouth daily. 90 tablet 2  . Multiple Vitamin (MULTIVITAMIN WITH MINERALS) TABS tablet Take 1 tablet by  mouth daily.    . Omega-3 Fatty Acids (FISH OIL) 1200 MG CAPS Take 1,200 mg by mouth 2 (two) times daily.     . traMADol (ULTRAM) 50 MG tablet Take 1 tablet (50 mg total) by mouth every 6 (six) hours as needed. (Patient taking differently: Take 50 mg by mouth every 6 (six) hours as needed (LEG PAIN). ) 20 tablet 0  . valACYclovir (VALTREX) 1000 MG tablet Take 1,000 mg by mouth daily as needed (FOR COLD SORES).     . vitamin E (VITAMIN E) 400 UNIT capsule Take 400 Units by mouth daily.     . colchicine 0.6 MG tablet Take 0.6 mg by mouth daily as needed (FOR GOUT).     . cyclobenzaprine (FLEXERIL) 10 MG tablet Take 10 mg by mouth daily as needed for muscle spasms.      Current Facility-Administered Medications  Medication Dose Route Frequency Provider Last Rate Last Dose  . methylPREDNISolone acetate (DEPO-MEDROL) injection 40 mg  40 mg Intramuscular Once [name], DO        Allergies  Allergen Reactions  . Penicillins Rash    Has patient had a PCN reaction causing immediate rash, facial/tongue/throat swelling, SOB or lightheadedness with hypotension: Unknown Has patient had a PCN reaction causing severe rash involving mucus membranes or skin necrosis: Unknown Has patient had a PCN reaction that required hospitalization: No Has patient had a PCN reaction occurring within the last 10 years: No Childhood reaction If all of the above answers are "NO", then may proceed with Cephalosporin use.     Social History   Socioeconomic History  . Marital status: Married    Spouse name: Not on file  . Number of children: Not on file  . Years of education: Not on file  . Highest education level: Not on file  Occupational History  . Not on file  Social Needs  . Financial resource strain: Not on file  . Food insecurity:    Worry: Not on file    Inability: Not on file  . Transportation needs:    Medical: Not on file    Non-medical: Not on file  Tobacco Use  . Smoking status: Former Research scientist (life sciences)  . Smokeless tobacco: Never Used  Substance and Sexual Activity  . Alcohol use: Yes    Comment: 3-4 drinks per day  . Drug use: No  . Sexual activity: Yes  Lifestyle  . Physical activity:    Days per week: Not on file    Minutes per session: Not on file  . Stress: Not on file  Relationships  . Social connections:    Talks on phone: Not on file    Gets together: Not on file    Attends religious service: Not on file    Active member of club or organization: Not on file      Attends meetings of clubs or organizations: Not on file    Relationship status: Not on file  . Intimate partner violence:    Fear of current or ex partner: Not on file    Emotionally abused: Not on file    Physically abused: Not on file    Forced sexual activity: Not on file  Other Topics Concern  . Not on file  Social History Narrative   Lives in [place]   Retired from [employer]    Family History  Problem Relation Age of Onset  . Heart attack Father   . Hypertension Father   . Hypertension Mother  ROS- All systems are reviewed and negative except as per the HPI above  Physical Exam: Vitals:   12/21/17 0958  BP: 106/68  Pulse: 80  Weight: 108.4 kg  Height: 6\' 1"  (1.854 m)   Wt Readings from Last 3 Encounters:  12/21/17 108.4 kg  12/05/17 104.3 kg  12/02/17 107.5 kg    Labs: Lab Results  Component Value Date   NA 138 12/02/2017   K 4.5 12/02/2017   CL 103 12/02/2017   CO2 28 12/02/2017   GLUCOSE 109 (H) 12/02/2017   BUN 12 12/02/2017   CREATININE 1.03 12/02/2017   CALCIUM 9.0 12/02/2017   MG 2.4 (H) 11/22/2017   Lab Results  Component Value Date   INR 1.0 06/17/2016   Lab Results  Component Value Date   CHOL 176 08/09/2010   HDL 46 08/09/2010   LDLCALC 83 08/09/2010   TRIG 234 (H) 08/09/2010     GEN- The patient is well appearing, alert and oriented x 3 today.   Head- normocephalic, atraumatic Eyes-  Sclera clear, conjunctiva pink Ears- hearing intact Oropharynx- clear Neck- supple, no JVP Lymph- no cervical lymphadenopathy Lungs- Clear to ausculation bilaterally, normal work of breathing Heart- irregular rate and rhythm, no murmurs, rubs or gallops, PMI not laterally displaced GI- soft, NT, ND, + BS Extremities- no clubbing, cyanosis, or edema MS- no significant deformity or atrophy Skin- no rash or lesion Psych- euthymic mood, full affect Neuro- strength and sensation are intact  EKG- afib  at 80 bpm, qrs int 92 ms, qtc 424 ms Epic  records reviewed Echo Study Conclusions Study Conclusions  - Left ventricle: The cavity size was at the upper limits of   normal. Systolic function was normal. The estimated ejection   fraction was in the range of 55% to 60%. Wall motion was normal;   there were no regional wall motion abnormalities. The study was   not technically sufficient to allow evaluation of LV diastolic   dysfunction due to atrial fibrillation. - Aortic valve: Poorly visualized. Trileaflet; normal thickness,   mildly calcified leaflets. - Mitral valve: Calcified annulus. There was trivial regurgitation. - Left atrium: The atrium was mildly dilated. - Tricuspid valve: There was trivial regurgitati   Assessment and Plan: 1. Persistent afib  Successful cardioversion 06/2616 Possible trigger for afib since 10/2517 was oral steroids and prednisone injection for back issues Recent cardioversion successful but ERAF Continue eliquis 5 mg bid for chadsvasc score of 2   Continue flecainide 100 mg bid for now Diltiazem 180 mg daily He is rate controlled He was warned that high dose NSAIDS's in combination with DOAC can put him at risk for GI bleed  I will refer back  to [name]  to  discuss tikosyn vrs ablation  Butch Penny C. Carroll, [facility] [address] [place], [place] [address] [phone]

## 2017-12-26 ENCOUNTER — Encounter: Payer: Self-pay | Admitting: Physical Therapy

## 2017-12-26 ENCOUNTER — Ambulatory Visit: Payer: PPO | Attending: Family Medicine | Admitting: Physical Therapy

## 2017-12-26 DIAGNOSIS — M5442 Lumbago with sciatica, left side: Secondary | ICD-10-CM | POA: Insufficient documentation

## 2017-12-26 DIAGNOSIS — M25651 Stiffness of right hip, not elsewhere classified: Secondary | ICD-10-CM | POA: Insufficient documentation

## 2017-12-26 DIAGNOSIS — M25552 Pain in left hip: Secondary | ICD-10-CM

## 2017-12-26 DIAGNOSIS — G8929 Other chronic pain: Secondary | ICD-10-CM | POA: Insufficient documentation

## 2017-12-26 DIAGNOSIS — M25652 Stiffness of left hip, not elsewhere classified: Secondary | ICD-10-CM | POA: Insufficient documentation

## 2017-12-26 DIAGNOSIS — M6281 Muscle weakness (generalized): Secondary | ICD-10-CM | POA: Insufficient documentation

## 2017-12-26 NOTE — Therapy (Signed)
[facility] [address], [place], [place], [address] Phone: [phone]   Fax:  [phone]  Physical Therapy Treatment  Patient Details  Name: Gary [name] MRN: [id number] Date of Birth: 10/31/1950 Referring Provider (PT): Karlton Lemon, MD   Encounter Date: 12/26/2017  PT End of Session - 12/26/17 1445    Visit Number  6    Date for PT Re-Evaluation  01/31/18    PT Start Time  2876    PT Stop Time  1533    PT Time Calculation (min)  48 min    Activity Tolerance  Patient tolerated treatment well    Behavior During Therapy  [facility] for tasks assessed/performed       Past Medical History:  Diagnosis Date  . Atrial flutter ([place]) 2007   s/p CTI ablation by Dr Lovena Le  . Back pain   . Hyperlipidemia   . Hypertension   . Insomnia   . NICM (nonischemic cardiomyopathy) ([place])    a. 2005 - tachy mediated. normalized after return of NSR.  [name] Obesity   . Paroxysmal atrial fibrillation [facility])     Past Surgical History:  Procedure Laterality Date  . atrial flutter ablation  06/10/2005   CTI ablation by Dr Lovena Le for atrial flutter  . CARDIOVERSION N/A 07/20/2016   Procedure: CARDIOVERSION;  Surgeon: Minus Breeding, MD;  Location: [facility] ENDOSCOPY;  Service: Cardiovascular;  Laterality: N/A;  . CARDIOVERSION N/A 12/13/2017   Procedure: CARDIOVERSION;  Surgeon: Acie Fredrickson Wonda Cheng, MD;  Location: [facility] ENDOSCOPY;  Service: Cardiovascular;  Laterality: N/A;  . NO PAST SURGERIES      There were no vitals filed for this visit.  Subjective Assessment - 12/26/17 1447    Subjective  I can sit in a chair longer, I made myself walk around the mall. Limped with pain at the end of that trip. Small bits of improvement.     Diagnostic tests  MRI of lumbar spine: facet degeneration and stenosis L5-S1, scoliosis    Currently in Pain?  No/denies    Multiple Pain Sites  No                       [facility] Adult PT  Treatment/Exercise - 12/26/17 0001      Lumbar Exercises: Aerobic   Recumbent Bike  Nustep L4 x 10 min   PTA present to discuss current status.      Lumbar Exercises: Seated   Other Seated Lumbar Exercises  Blue ball core stabilization exercises.       Lumbar Exercises: Sidelying   Other Sidelying Lumbar Exercises  Modified side plank: lifting only feet. 5x 10 sec   tried SL plank with small hip lift: pt could not lift     Traction   Type of Traction  Lumbar    Min (lbs)  50    Max (lbs)  80    Time  5   tried less pull, static pull to see if pain less              PT Short Term Goals - 12/12/17 0928      PT SHORT TERM GOAL #1   Title  be independent in initial HEP    Time  4    Period  Weeks    Status  Achieved        PT Long Term Goals - 12/06/17 1215      PT LONG TERM GOAL #1  Title  be independent in advanced HEP    Time  8    Period  Weeks    Status  New    Target Date  01/31/18      PT LONG TERM GOAL #2   Title  report a 50% reduction in Lt LE pain with standing and walking    Time  8    Period  Weeks    Status  New    Target Date  01/31/18      PT LONG TERM GOAL #3   Title  verbalize and demonstrate body mechanics modifications for lumbar protection with daily tasks    Time  8    Period  Weeks    Status  New    Target Date  01/31/18      PT LONG TERM GOAL #4   Title  reduce Lt LE pain to walk for 15 minutes without limitation    Time  8    Period  Weeks    Status  New    Target Date  01/31/18      PT LONG TERM GOAL #5   Title  reduce FOTO to < or = to 35% limitation    Time  8    Period  Weeks    Status  New    Target Date  01/31/18            Plan - 12/26/17 1445    Clinical Impression Statement  Pt compliant with his HEP. Added seated core stabilization exercises on the ball for HEP. Pt had increased symptoms while on traction but he wanted to try it at least 1x more so see how he does. We decided to try less pull on a  static setting x 5 min.  We tried advancing his sideplanks from a very basic lift of his feet to a small hip lift. pt could feel muscles working but her could not actively lift his hip.     Rehab Potential  Good    PT Frequency  2x / week    PT Duration  8 weeks    PT Treatment/Interventions  ADLs/Self Care Home Management;Cryotherapy;Electrical Stimulation;Traction;Moist Heat;Functional mobility training;Therapeutic activities;Therapeutic exercise;Patient/family education;Neuromuscular re-education;Manual techniques;Passive range of motion;Taping;Dry needling    PT Next Visit Plan  Assess traction with new parameters. Review seated ball exercises given today for HEP.     PT Home Exercise Plan  Access Code: [email or web address]     Consulted and Agree with Plan of Care  Patient       Patient will benefit from skilled therapeutic intervention in order to improve the following deficits and impairments:  Pain, Improper body mechanics, Impaired flexibility, Decreased activity tolerance, Decreased range of motion, Decreased strength, Postural dysfunction  Visit Diagnosis: Pain in left hip  Chronic left-sided low back pain with left-sided sciatica  Stiffness of left hip, not elsewhere classified  Stiffness of right hip, not elsewhere classified     Problem List Patient Active Problem List   Diagnosis Date Noted  . 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 Left hip pain 09/13/2016  . Family history of cardiovascular disease 05/21/2015  . Obesity   . Hyperlipidemia   . A-fib ([place])   . Atrial flutter ([place])   . Insomnia   . Heart disease   . Back pain   . Cardiomyopathy ([place])     Huntington Leverich, PTA 12/26/2017, 3:31 PM  [facility] [address], [place], [place], [address]  Phone: [phone]   Fax:  [phone]  Name: Gary [name] MRN: [id number] Date of Birth: [birth year]  Access Code: [email or web address]  URL:  [email or web address]  Date: 12/26/2017  Prepared by: Myrene Galas   Exercises  Hooklying Single Knee to Chest - 3 reps - 1 sets - 20 hold - 3x daily - 7x weekly  Seated Hamstring Stretch - 3 reps - 1 sets - 20 hold - 3x daily - 7x weekly  Supine Double Knee to Chest - 3 reps - 20 hold - 3x daily - 7x weekly  Supine Pelvic Tilt - 10 reps - 1 sets - 3 hold - 2x daily - 7x weekly  Supine March - 10 reps - 3 sets - 1x daily - 7x weekly  Supine Transversus Abdominis Bracing with Double Leg Fallout - 10 reps - 1 sets - 2x daily - 7x weekly  Seated Shoulder Horizontal Abduction with Resistance - Palms Down - 10 reps - 2 sets - 2 hold - 2x daily - 7x weekly  Seated Shoulder Diagonal Pulls with Resistance - 10 reps - 2 sets - 3 hold - 2x daily - 7x weekly  Seated Long Arc Quad - 10 reps - 2 sets - 1-2x daily - 7x weekly  Swiss Ball March - 10 reps - 2 sets - 3 hold - 2x daily - 7x weekly  Seated March with Opposite Arm Flexion on Swiss Ball - 10 reps - 2 sets - 2x daily - 7x weekly

## 2017-12-27 ENCOUNTER — Other Ambulatory Visit: Payer: Self-pay | Admitting: Interventional Cardiology

## 2017-12-27 NOTE — Telephone Encounter (Signed)
Eliquis 5mg  refill request received; pt is 67 yrs old, wt-104.3kg, Crea-1.03 on 12/02/17, last seen by Roderic Palau on 12/21/17; will send in refill to requested pharmacy.

## 2017-12-28 ENCOUNTER — Ambulatory Visit: Payer: PPO | Admitting: Physical Therapy

## 2017-12-28 ENCOUNTER — Encounter: Payer: Self-pay | Admitting: Physical Therapy

## 2017-12-28 DIAGNOSIS — M25652 Stiffness of left hip, not elsewhere classified: Secondary | ICD-10-CM

## 2017-12-28 DIAGNOSIS — M25552 Pain in left hip: Secondary | ICD-10-CM

## 2017-12-28 DIAGNOSIS — M5442 Lumbago with sciatica, left side: Secondary | ICD-10-CM

## 2017-12-28 DIAGNOSIS — G8929 Other chronic pain: Secondary | ICD-10-CM

## 2017-12-28 DIAGNOSIS — M25651 Stiffness of right hip, not elsewhere classified: Secondary | ICD-10-CM

## 2017-12-28 NOTE — Therapy (Signed)
[facility] [address], [place], [place], [address] Phone: [phone]   Fax:  [phone]  Physical Therapy Treatment  Patient Details  Name: Gary [name] MRN: [id number] Date of Birth: [birth year] Referring Provider (PT): Karlton Lemon, MD   Encounter Date: 12/28/2017  PT End of Session - 12/28/17 1054    Visit Number  7    Date for PT Re-Evaluation  01/31/18    PT Start Time  2505    PT Stop Time  1145    PT Time Calculation (min)  53 min    Activity Tolerance  Patient tolerated treatment well    Behavior During Therapy  [facility] for tasks assessed/performed       Past Medical History:  Diagnosis Date  . Atrial flutter ([place]) 2007   s/p CTI ablation by Dr Lovena Le  . Back pain   . Hyperlipidemia   . Hypertension   . Insomnia   . NICM (nonischemic cardiomyopathy) ([place])    a. 2005 - tachy mediated. normalized after return of NSR.  [name] Obesity   . Paroxysmal atrial fibrillation [facility])     Past Surgical History:  Procedure Laterality Date  . atrial flutter ablation  06/10/2005   CTI ablation by Dr Lovena Le for atrial flutter  . CARDIOVERSION N/A 07/20/2016   Procedure: CARDIOVERSION;  Surgeon: Minus Breeding, MD;  Location: [facility] ENDOSCOPY;  Service: Cardiovascular;  Laterality: N/A;  . CARDIOVERSION N/A 12/13/2017   Procedure: CARDIOVERSION;  Surgeon: Acie Fredrickson Wonda Cheng, MD;  Location: [facility] ENDOSCOPY;  Service: Cardiovascular;  Laterality: N/A;  . NO PAST SURGERIES      There were no vitals filed for this visit.  Subjective Assessment - 12/28/17 1057    Subjective  I can do my exercise bike with more comfort ( small, but better) if i REALLY concentrate on my posture and use my core. I think the traction was better, would like to do again.     Currently in Pain?  Yes    Pain Score  2     Pain Location  Hip    Pain Orientation  Left    Pain Descriptors / Indicators  Dull    Aggravating Factors   Laying supine,  overdoing it, a lot of walking    Pain Relieving Factors  Exercises helping a little    Multiple Pain Sites  No                       [facility] Adult PT Treatment/Exercise - 12/28/17 0001      Lumbar Exercises: Aerobic   Recumbent Bike  Nustep L5x 10 min   PTA present to discuss current status.      Lumbar Exercises: Seated   Other Seated Lumbar Exercises  Blue ball: marching 10x, LT LAQ 6x, green band horizontal abd 2x15, diagonals green 2x15 bil      Knee/Hip Exercises: Standing   Forward Step Up  Left;1 set;10 reps;Hand Hold: 2;Step Height: 6"      Traction   Type of Traction  Lumbar    Max (lbs)  80    Hold Time  --   Static   Time  5               PT Short Term Goals - 12/12/17 3976      PT SHORT TERM GOAL #1   Title  be independent in initial HEP    Time  4  Period  Weeks    Status  Achieved        PT Long Term Goals - 12/06/17 1215      PT LONG TERM GOAL #1   Title  be independent in advanced HEP    Time  8    Period  Weeks    Status  New    Target Date  01/31/18      PT LONG TERM GOAL #2   Title  report a 50% reduction in Lt LE pain with standing and walking    Time  8    Period  Weeks    Status  New    Target Date  01/31/18      PT LONG TERM GOAL #3   Title  verbalize and demonstrate body mechanics modifications for lumbar protection with daily tasks    Time  8    Period  Weeks    Status  New    Target Date  01/31/18      PT LONG TERM GOAL #4   Title  reduce Lt LE pain to walk for 15 minutes without limitation    Time  8    Period  Weeks    Status  New    Target Date  01/31/18      PT LONG TERM GOAL #5   Title  reduce FOTO to < or = to 35% limitation    Time  8    Period  Weeks    Status  New    Target Date  01/31/18            Plan - 12/28/17 1055    Clinical Impression Statement  Pt continues to see small improvements: he can go farther with his knee to chest stretch without pain, he walk with short  stopping breaks and has less leg symptoms. He had not tried any of his ball exercises yet so we reviewed/performed them in clinic today. Low reps with hip flexion/marching as it aggrevated his LT hip, LAQ were better but provoked a little nerve pain lower Lt leg. Pt was able to tolerate static mechanical traction better so we continued withthat today, keeping parameters the same.  There was no LT radicular symtpoms after todays traction.     Rehab Potential  Good    PT Frequency  2x / week    PT Duration  8 weeks    PT Treatment/Interventions  ADLs/Self Care Home Management;Cryotherapy;Electrical Stimulation;Traction;Moist Heat;Functional mobility training;Therapeutic activities;Therapeutic exercise;Patient/family education;Neuromuscular re-education;Manual techniques;Passive range of motion;Taping;Dry needling    PT Next Visit Plan  Flexion based core strength/seated and standing versus supine is better. Traction    PT Home Exercise Plan  Access Code: [email or web address]     Consulted and Agree with Plan of Care  Patient       Patient will benefit from skilled therapeutic intervention in order to improve the following deficits and impairments:  Pain, Improper body mechanics, Impaired flexibility, Decreased activity tolerance, Decreased range of motion, Decreased strength, Postural dysfunction  Visit Diagnosis: Pain in left hip  Chronic left-sided low back pain with left-sided sciatica  Stiffness of left hip, not elsewhere classified  Stiffness of right hip, not elsewhere classified     Problem List Patient Active Problem List   Diagnosis Date Noted  . 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 Left hip pain 09/13/2016  . Family history of cardiovascular disease 05/21/2015  . Obesity   . Hyperlipidemia   . A-fib ([place])   .  Atrial flutter ([place])   . Insomnia   . Heart disease   . Back pain   . Cardiomyopathy ([place])     Cynia Abruzzo, PTA 12/28/2017, 11:46 AM  [facility] [address] [place], [place], [address] Phone: [phone]   Fax:  [phone]  Name: Gary [name] MRN: [id number] Date of Birth: Dec 30, 1950

## 2018-01-02 ENCOUNTER — Encounter: Payer: Self-pay | Admitting: Physical Therapy

## 2018-01-02 ENCOUNTER — Ambulatory Visit: Payer: PPO | Admitting: Physical Therapy

## 2018-01-02 DIAGNOSIS — G8929 Other chronic pain: Secondary | ICD-10-CM

## 2018-01-02 DIAGNOSIS — M25552 Pain in left hip: Secondary | ICD-10-CM | POA: Diagnosis not present

## 2018-01-02 DIAGNOSIS — M5442 Lumbago with sciatica, left side: Secondary | ICD-10-CM

## 2018-01-02 DIAGNOSIS — M25652 Stiffness of left hip, not elsewhere classified: Secondary | ICD-10-CM

## 2018-01-02 DIAGNOSIS — M25651 Stiffness of right hip, not elsewhere classified: Secondary | ICD-10-CM

## 2018-01-02 NOTE — Therapy (Signed)
[facility] [address] [place], [place], [address] Phone: [phone]   Fax:  [phone]  Physical Therapy Treatment  Patient Details  Name: Gary [name] MRN: [id number] Date of Birth: 02/17/1950 Referring Provider (PT): Gary Lemon, MD   Encounter Date: 01/02/2018  PT End of Session - 01/02/18 1026    Visit Number  8    Date for PT Re-Evaluation  01/31/18    PT Start Time  3151    PT Stop Time  1108    PT Time Calculation (min)  53 min    Activity Tolerance  Patient tolerated treatment well    Behavior During Therapy  Gary [name] for tasks assessed/performed       Past Medical History:  Diagnosis Date  . Atrial flutter ([place]) 2007   s/p CTI ablation by Dr Gary [name]  . Back pain   . Hyperlipidemia   . Hypertension   . Insomnia   . NICM (nonischemic cardiomyopathy) ([place])    a. 2005 - tachy mediated. normalized after return of NSR.  [name] Obesity   . Paroxysmal atrial fibrillation [name])     Past Surgical History:  Procedure Laterality Date  . atrial flutter ablation  06/10/2005   CTI ablation by Dr Gary [name] for atrial flutter  . CARDIOVERSION N/A 07/20/2016   Procedure: CARDIOVERSION;  Surgeon: Gary Breeding, MD;  Location: [facility] ENDOSCOPY;  Service: Cardiovascular;  Laterality: N/A;  . CARDIOVERSION N/A 12/13/2017   Procedure: CARDIOVERSION;  Surgeon: Gary Fredrickson Wonda Cheng, MD;  Location: [facility] ENDOSCOPY;  Service: Cardiovascular;  Laterality: N/A;  . NO PAST SURGERIES      There were no vitals filed for this visit.  Subjective Assessment - 01/02/18 1027    Subjective  I was able to lay in my bed on my back 10-15 min before my leg started hurting. i have been sittin gon the ball often to watch TV.    Currently in Pain?  No/denies    Multiple Pain Sites  No                       [facility] Adult PT Treatment/Exercise - 01/02/18 0001      Lumbar Exercises: Seated   Other Seated Lumbar Exercises  Blue ball:  marching 10x, LT LAQ 2x 10x,  green band horizontal abd 2x20, diagonals green 20x bil   Seated hamstring stretch on ball bil 2x 20sec     Knee/Hip Exercises: Standing   SLS  LTLE 3x 20-30 sec:    Other Standing Knee Exercises  Floor glider forward bil 10x, then sideways 10x bil       Traction   Type of Traction  Lumbar    Max (lbs)  80    Hold Time  --   Static   Time  8             PT Education - 01/02/18 1059    Education Details  Single leg stance Lt for HEP    Person(s) Educated  Patient    Methods  Explanation;Demonstration;Handout    Comprehension  Returned demonstration;Verbalized understanding       PT Short Term Goals - 12/12/17 0928      PT SHORT TERM GOAL #1   Title  be independent in initial HEP    Time  4    Period  Weeks    Status  Achieved        PT Long Term Goals - 12/06/17 1215  PT LONG TERM GOAL #1   Title  be independent in advanced HEP    Time  8    Period  Weeks    Status  New    Target Date  01/31/18      PT LONG TERM GOAL #2   Title  report a 50% reduction in Lt [name] pain with standing and walking    Time  8    Period  Weeks    Status  New    Target Date  01/31/18      PT LONG TERM GOAL #3   Title  verbalize and demonstrate body mechanics modifications for lumbar protection with daily tasks    Time  8    Period  Weeks    Status  New    Target Date  01/31/18      PT LONG TERM GOAL #4   Title  reduce Lt [name] pain to walk for 15 minutes without limitation    Time  8    Period  Weeks    Status  New    Target Date  01/31/18      PT LONG TERM GOAL #5   Title  reduce FOTO to < or = to 35% limitation    Time  8    Period  Weeks    Status  New    Target Date  01/31/18            Plan - 01/02/18 1038    Clinical Impression Statement  Pt reports he continues to see small improvements: he laid supine for 10-15 minutes wihtout and [name] symptoms, sitting continues to improve ( he can sit longer) and he is sitting/stanidng  with better posture more often. We added some standing hip stabilization today to HEP ( single leg stance) and further challenged the hips using the gliders. Increased static mechanical traction to 8 min. This did have a momnet where his LTLE began to hurt but it quickly went away.     Rehab Potential  Good    PT Frequency  2x / week    PT Duration  8 weeks    PT Next Visit Plan  Hip strength and stability in standing, address any calf weakness.     PT Home Exercise Plan  Access Code: [email or web address]     Consulted and Agree with Plan of Care  Patient       Patient will benefit from skilled therapeutic intervention in order to improve the following deficits and impairments:  Pain, Improper body mechanics, Impaired flexibility, Decreased activity tolerance, Decreased range of motion, Decreased strength, Postural dysfunction  Visit Diagnosis: Pain in left hip  Chronic left-sided low back pain with left-sided sciatica  Stiffness of left hip, not elsewhere classified  Stiffness of right hip, not elsewhere classified     Problem List Patient Active Problem List   Diagnosis Date Noted  . 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 Left hip pain 09/13/2016  . Family history of cardiovascular disease 05/21/2015  . Obesity   . Hyperlipidemia   . A-fib ([place])   . Atrial flutter ([place])   . Insomnia   . Heart disease   . Back pain   . Cardiomyopathy ([place])     Gary [name], PTA 01/02/2018, 2:07 PM  [facility] [address], [place], [place], [address] Phone: [phone]   Fax:  [phone]  Name: Gary [name] MRN: [id number] Date of Birth: April 09, 1950

## 2018-01-03 DIAGNOSIS — Z0000 Encounter for general adult medical examination without abnormal findings: Secondary | ICD-10-CM | POA: Diagnosis not present

## 2018-01-03 DIAGNOSIS — F1010 Alcohol abuse, uncomplicated: Secondary | ICD-10-CM | POA: Diagnosis not present

## 2018-01-03 DIAGNOSIS — I4891 Unspecified atrial fibrillation: Secondary | ICD-10-CM | POA: Diagnosis not present

## 2018-01-03 DIAGNOSIS — M4800 Spinal stenosis, site unspecified: Secondary | ICD-10-CM | POA: Diagnosis not present

## 2018-01-03 DIAGNOSIS — E669 Obesity, unspecified: Secondary | ICD-10-CM | POA: Diagnosis not present

## 2018-01-03 DIAGNOSIS — Z125 Encounter for screening for malignant neoplasm of prostate: Secondary | ICD-10-CM | POA: Diagnosis not present

## 2018-01-03 DIAGNOSIS — D751 Secondary polycythemia: Secondary | ICD-10-CM | POA: Diagnosis not present

## 2018-01-03 DIAGNOSIS — I429 Cardiomyopathy, unspecified: Secondary | ICD-10-CM | POA: Diagnosis not present

## 2018-01-03 DIAGNOSIS — E782 Mixed hyperlipidemia: Secondary | ICD-10-CM | POA: Diagnosis not present

## 2018-01-03 DIAGNOSIS — I119 Hypertensive heart disease without heart failure: Secondary | ICD-10-CM | POA: Diagnosis not present

## 2018-01-03 DIAGNOSIS — M109 Gout, unspecified: Secondary | ICD-10-CM | POA: Diagnosis not present

## 2018-01-03 DIAGNOSIS — R7301 Impaired fasting glucose: Secondary | ICD-10-CM | POA: Diagnosis not present

## 2018-01-04 ENCOUNTER — Encounter: Payer: Self-pay | Admitting: Physical Therapy

## 2018-01-04 ENCOUNTER — Ambulatory Visit: Payer: PPO | Admitting: Physical Therapy

## 2018-01-04 DIAGNOSIS — M25651 Stiffness of right hip, not elsewhere classified: Secondary | ICD-10-CM

## 2018-01-04 DIAGNOSIS — M25552 Pain in left hip: Secondary | ICD-10-CM | POA: Diagnosis not present

## 2018-01-04 DIAGNOSIS — M25652 Stiffness of left hip, not elsewhere classified: Secondary | ICD-10-CM

## 2018-01-04 DIAGNOSIS — G8929 Other chronic pain: Secondary | ICD-10-CM

## 2018-01-04 DIAGNOSIS — M5442 Lumbago with sciatica, left side: Secondary | ICD-10-CM

## 2018-01-04 NOTE — Therapy (Signed)
[facility] [address], [place], [place], [address] Phone: [phone]   Fax:  [phone]  Physical Therapy Treatment  Patient Details  Name: Gary [name] MRN: [id number] Date of Birth: 02-06-1950 Referring Provider (PT): Karlton Lemon, MD   Encounter Date: 01/04/2018  PT End of Session - 01/04/18 1005    Visit Number  9    Date for PT Re-Evaluation  01/31/18    PT Start Time  1005    PT Stop Time  1107    PT Time Calculation (min)  62 min    Activity Tolerance  Patient tolerated treatment well    Behavior During Therapy  [facility] for tasks assessed/performed       Past Medical History:  Diagnosis Date  . Atrial flutter ([place]) 2007   s/p CTI ablation by Dr Lovena Le  . Back pain   . Hyperlipidemia   . Hypertension   . Insomnia   . NICM (nonischemic cardiomyopathy) ([place])    a. 2005 - tachy mediated. normalized after return of NSR.  [name] Obesity   . Paroxysmal atrial fibrillation [facility])     Past Surgical History:  Procedure Laterality Date  . atrial flutter ablation  06/10/2005   CTI ablation by Dr Lovena Le for atrial flutter  . CARDIOVERSION N/A 07/20/2016   Procedure: CARDIOVERSION;  Surgeon: Minus Breeding, MD;  Location[facility] ENDOSCOPY;  Service: Cardiovascular;  Laterality: N/A;  . CARDIOVERSION N/A 12/13/2017   Procedure: CARDIOVERSION;  Surgeon: Acie Fredrickson Wonda Cheng, MD;  Location: [facility] ENDOSCOPY;  Service: Cardiovascular;  Laterality: N/A;  . NO PAST SURGERIES      There were no vitals filed for this visit.  Subjective Assessment - 01/04/18 1006    Subjective  Yesterday was just not a good day for me. Unsure exactly why but my LT hip did hurt. I did my exercises and it felt some better. I have no pain right now.     Currently in Pain?  No/denies    Multiple Pain Sites  No                       [facility] Adult PT Treatment/Exercise - 01/04/18 0001      Lumbar Exercises: Aerobic   Recumbent Bike   Nustep L5x 10 min   PTA present to discuss current status.      Knee/Hip Exercises: Standing   Hip Flexion  Stengthening;Both;2 sets;10 reps;Knee bent   5#   Hip Abduction  Stengthening;Both;1 set;20 reps;Knee straight   5#   SLS  LTLE on black pad 20 sec    VC to ground Great Toe more, contract buttocks   Other Standing Knee Exercises  Floor glider 10x2 side, forward, back under RTLE: Under LT 10x2 side and forward      Traction   Type of Traction  Lumbar    Max (lbs)  80    Time  8               PT Short Term Goals - 12/12/17 0928      PT SHORT TERM GOAL #1   Title  be independent in initial HEP    Time  4    Period  Weeks    Status  Achieved        PT Long Term Goals - 12/06/17 1215      PT LONG TERM GOAL #1   Title  be independent in advanced HEP    Time  8    Period  Weeks    Status  New    Target Date  01/31/18      PT LONG TERM GOAL #2   Title  report a 50% reduction in Lt LE pain with standing and walking    Time  8    Period  Weeks    Status  New    Target Date  01/31/18      PT LONG TERM GOAL #3   Title  verbalize and demonstrate body mechanics modifications for lumbar protection with daily tasks    Time  8    Period  Weeks    Status  New    Target Date  01/31/18      PT LONG TERM GOAL #4   Title  reduce Lt LE pain to walk for 15 minutes without limitation    Time  8    Period  Weeks    Status  New    Target Date  01/31/18      PT LONG TERM GOAL #5   Title  reduce FOTO to < or = to 35% limitation    Time  8    Period  Weeks    Status  New    Target Date  01/31/18            Plan - 01/04/18 1110    Clinical Impression Statement  Pt had a "bad" bad yesterday, unsure why? He did not think it was from the new hip exercises we did last session, but could not be sure. He was able to replicate the hips exercises today without pain so we added them to advance his HEP.     Rehab Potential  Good    PT Frequency  2x / week    PT  Duration  8 weeks    PT Treatment/Interventions  ADLs/Self Care Home Management;Cryotherapy;Electrical Stimulation;Traction;Moist Heat;Functional mobility training;Therapeutic activities;Therapeutic exercise;Patient/family education;Neuromuscular re-education;Manual techniques;Passive range of motion;Taping;Dry needling    PT Next Visit Plan  Hip strength and stability in standing, address any calf weakness. Talk to pt about resuming some walking to see if there is some improvement.     PT Home Exercise Plan  Access Code: [email or web address]     Consulted and Agree with Plan of Care  Patient       Patient will benefit from skilled therapeutic intervention in order to improve the following deficits and impairments:  Pain, Improper body mechanics, Impaired flexibility, Decreased activity tolerance, Decreased range of motion, Decreased strength, Postural dysfunction  Visit Diagnosis: Pain in left hip  Chronic left-sided low back pain with left-sided sciatica  Stiffness of left hip, not elsewhere classified  Stiffness of right hip, not elsewhere classified     Problem List Patient Active Problem List   Diagnosis Date Noted  . 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 Left hip pain 09/13/2016  . Family history of cardiovascular disease 05/21/2015  . Obesity   . Hyperlipidemia   . A-fib ([place])   . Atrial flutter ([place])   . Insomnia   . Heart disease   . Back pain   . Cardiomyopathy ([place])     Mikea Quadros, PTA 01/04/2018, 11:16 AM  [facility] [address], [place], [place], [address] Phone: [phone]   Fax:  [phone]  Name: Gary [name] MRN: [id number] Date of Birth: 1950-05-15  Access Code: [email or web address]  URL: [email or web address]  Date: 01/04/2018  Prepared by: Myrene Galas   Exercises  Hooklying Single Knee to Chest - 3 reps - 1 sets - 20 hold - 3x daily - 7x weekly  Seated Hamstring Stretch  - 3 reps - 1 sets - 20 hold - 3x daily - 7x weekly  Supine Double Knee to Chest - 3 reps - 20 hold - 3x daily - 7x weekly  Supine Pelvic Tilt - 10 reps - 1 sets - 3 hold - 2x daily - 7x weekly  Supine March - 10 reps - 3 sets - 1x daily - 7x weekly  Supine Transversus Abdominis Bracing with Double Leg Fallout - 10 reps - 1 sets - 2x daily - 7x weekly  Seated Shoulder Horizontal Abduction with Resistance - Palms Down - 10 reps - 2 sets - 2 hold - 2x daily - 7x weekly  Seated Shoulder Diagonal Pulls with Resistance - 10 reps - 2 sets - 3 hold - 2x daily - 7x weekly  Seated Long Arc Quad - 10 reps - 2 sets - 1-2x daily - 7x weekly  Swiss Ball March - 10 reps - 2 sets - 3 hold - 2x daily - 7x weekly  Seated March with Opposite Arm Flexion on Swiss Ball - 10 reps - 2 sets - 2x daily - 7x weekly  Standing Single Leg Heel Raise - 3 reps - 1 sets - 30 hold - 3x daily - 7x weekly  Standing Hip Abduction - 10 reps - 2 sets - 2x daily - 7x weekly  Standing Hip Flexion March - 10 reps - 2 sets - 2x daily - 7x weekly  Single Leg Stance - 5 reps - 1 sets - 20 hold - 2x daily - 7x weekly  Single Leg Balance on Foam Pad - 3 reps - 1 sets - 20 hold - 1x daily - 7x weekly

## 2018-01-09 ENCOUNTER — Ambulatory Visit: Payer: PPO | Admitting: Physical Therapy

## 2018-01-09 ENCOUNTER — Encounter: Payer: Self-pay | Admitting: Physical Therapy

## 2018-01-09 DIAGNOSIS — G8929 Other chronic pain: Secondary | ICD-10-CM

## 2018-01-09 DIAGNOSIS — M25651 Stiffness of right hip, not elsewhere classified: Secondary | ICD-10-CM

## 2018-01-09 DIAGNOSIS — M25652 Stiffness of left hip, not elsewhere classified: Secondary | ICD-10-CM

## 2018-01-09 DIAGNOSIS — M5442 Lumbago with sciatica, left side: Secondary | ICD-10-CM

## 2018-01-09 DIAGNOSIS — M25552 Pain in left hip: Secondary | ICD-10-CM | POA: Diagnosis not present

## 2018-01-09 NOTE — Therapy (Addendum)
[facility] [address] [place], [place], [address] Phone: [phone]   Fax:  [phone]  Physical Therapy Treatment  Patient Details  Name: Gary [name] MRN: [id number] Date of Birth: 1950-07-17 Referring Provider (PT): Karlton Lemon, MD   Encounter Date: 01/09/2018 Physical Therapy Progress Note  Progress Note Reporting Period 12/06/17 to 01/09/18  See note below for Objective Data and Assessment of Progress/Goals.   Pt was able to sit and perform supine activity with longer duration prior to flare-up from new exercise last session.  Pt reports 10-20% overall improvement since the start of care.  Progress has been slow due to chronic nature of symptoms.    Sigurd Sos, PT 01/09/18 4:11 PM    PT End of Session - 01/09/18 1017    Visit Number  10    Date for PT Re-Evaluation  01/31/18    PT Start Time  1016    PT Stop Time  1100    PT Time Calculation (min)  44 min    Activity Tolerance  Patient tolerated treatment well    Behavior During Therapy  WFL for tasks assessed/performed       Past Medical History:  Diagnosis Date  . Atrial flutter ([place]) 2007   s/p CTI ablation by Dr Lovena Le  . Back pain   . Hyperlipidemia   . Hypertension   . Insomnia   . NICM (nonischemic cardiomyopathy) ([place])    a. 2005 - tachy mediated. normalized after return of NSR.  [name] Obesity   . Paroxysmal atrial fibrillation [facility])     Past Surgical History:  Procedure Laterality Date  . atrial flutter ablation  06/10/2005   CTI ablation by Dr Lovena Le for atrial flutter  . CARDIOVERSION N/A 07/20/2016   Procedure: CARDIOVERSION;  Surgeon: Minus Breeding, MD;  Location: [facility] ENDOSCOPY;  Service: Cardiovascular;  Laterality: N/A;  . CARDIOVERSION N/A 12/13/2017   Procedure: CARDIOVERSION;  Surgeon: Acie Fredrickson Wonda Cheng, MD;  Location: [facility] ENDOSCOPY;  Service: Cardiovascular;  Laterality: N/A;  . NO PAST SURGERIES      There were  no vitals filed for this visit.  Subjective Assessment - 01/09/18 1017    Subjective  New exercises may have aggrevated my hip and the nerve pain. Did not bother me when I was doing them, it was some time after.     Currently in Pain?  No/denies         [facility] PT Assessment - 01/09/18 0001      Assessment   Medical Diagnosis  Lt hip pain    Referring Provider (PT)  Karlton Lemon, MD    Onset Date/Surgical Date  03/08/17                   [facility] Adult PT Treatment/Exercise - 01/09/18 0001      Lumbar Exercises: Stretches   Single Knee to Chest Stretch  Right;Left;3 reps;20 seconds      Lumbar Exercises: Aerobic   Recumbent Bike  Nustep L5x 10 min   PTA present to discuss current status.      Lumbar Exercises: Sidelying   Other Sidelying Lumbar Exercises  Pt able to demo modified  plank ( hip lift) for 10 sec       Knee/Hip Exercises: Standing   Hip Flexion  Stengthening;Both;2 sets;20 reps;Knee bent   2#   Hip Abduction  Stengthening;Both;2 sets;10 reps;Knee straight   0#, then 2# second set   SLS  Blue pod bil  10 sec 3x each    Other Standing Knee Exercises  Floor glider 10x2 side, forward, back under RTLE: Under LT 10x2 side and forward      Traction   Type of Traction  --   unavaliable today              PT Short Term Goals - 01/09/18 1357      PT SHORT TERM GOAL #1   Title  be independent in initial HEP    Time  4    Period  Weeks    Status  Achieved      PT SHORT TERM GOAL #2   Title  report a 25% reduction in Lt LE pain with standing and walking     Time  4    Period  Weeks    Status  On-going   Some days maybe 10%-20% but nothing really consistent so far.       PT Long Term Goals - 01/09/18 1358      PT LONG TERM GOAL #4   Title  reduce Lt LE pain to walk for 15 minutes without limitation    Time  8    Period  Weeks    Status  On-going   Has not tried walking yet for exercise           Plan - 01/09/18 1017     Clinical Impression Statement  Pt had some increased symtpoms over the weekend that he feels bumped his progress down a few notches. He thinks the new exercises are challenging and may been aggrevating the hip and leg.  He was able to reproduce all the new exercises today without any exacerbations of pain. Mechanical traction was unavailable today so PTA provided manual traction to the LTLE today.     Rehab Potential  Good    PT Frequency  2x / week    PT Duration  8 weeks    PT Treatment/Interventions  ADLs/Self Care Home Management;Cryotherapy;Electrical Stimulation;Traction;Moist Heat;Functional mobility training;Therapeutic activities;Therapeutic exercise;Patient/family education;Neuromuscular re-education;Manual techniques;Passive range of motion;Taping;Dry needling    PT Next Visit Plan  Hip strength and stability in standing, address any calf weakness. Talk to pt about resuming some walking to see if there is some improvement.     PT Home Exercise Plan  Access Code: [email or web address]     Consulted and Agree with Plan of Care  Patient       Patient will benefit from skilled therapeutic intervention in order to improve the following deficits and impairments:  Pain, Improper body mechanics, Impaired flexibility, Decreased activity tolerance, Decreased range of motion, Decreased strength, Postural dysfunction  Visit Diagnosis: Pain in left hip  Chronic left-sided low back pain with left-sided sciatica  Stiffness of left hip, not elsewhere classified  Stiffness of right hip, not elsewhere classified     Problem List Patient Active Problem List   Diagnosis Date Noted  . 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 Left hip pain 09/13/2016  . Family history of cardiovascular disease 05/21/2015  . Obesity   . Hyperlipidemia   . A-fib ([place])   . Atrial flutter ([place])   . Insomnia   . Heart disease   . Back pain   . Cardiomyopathy [facility])     Myrene Galas, PTA 01/09/18 2:48 PM   [facility] [address], [place], [place], [address] Phone: [phone]   Fax:  [phone]  Name: ALLANTE WHITMIRE MRN: [id number] Date of  Birth: Jul 28, 1950

## 2018-01-10 DIAGNOSIS — M4800 Spinal stenosis, site unspecified: Secondary | ICD-10-CM | POA: Diagnosis not present

## 2018-01-10 DIAGNOSIS — M5440 Lumbago with sciatica, unspecified side: Secondary | ICD-10-CM | POA: Diagnosis not present

## 2018-01-10 DIAGNOSIS — M4150 Other secondary scoliosis, site unspecified: Secondary | ICD-10-CM | POA: Diagnosis not present

## 2018-01-11 ENCOUNTER — Ambulatory Visit: Payer: PPO | Admitting: Physical Therapy

## 2018-01-11 ENCOUNTER — Encounter: Payer: Self-pay | Admitting: Physical Therapy

## 2018-01-11 ENCOUNTER — Ambulatory Visit: Payer: PPO | Admitting: Internal Medicine

## 2018-01-11 ENCOUNTER — Encounter: Payer: Self-pay | Admitting: Internal Medicine

## 2018-01-11 VITALS — BP 112/70 | HR 82 | Ht 72.0 in | Wt 239.4 lb

## 2018-01-11 DIAGNOSIS — I4819 Other persistent atrial fibrillation: Secondary | ICD-10-CM | POA: Diagnosis not present

## 2018-01-11 DIAGNOSIS — M25552 Pain in left hip: Secondary | ICD-10-CM

## 2018-01-11 DIAGNOSIS — M25652 Stiffness of left hip, not elsewhere classified: Secondary | ICD-10-CM

## 2018-01-11 DIAGNOSIS — M25651 Stiffness of right hip, not elsewhere classified: Secondary | ICD-10-CM

## 2018-01-11 DIAGNOSIS — M5442 Lumbago with sciatica, left side: Secondary | ICD-10-CM

## 2018-01-11 DIAGNOSIS — G8929 Other chronic pain: Secondary | ICD-10-CM

## 2018-01-11 NOTE — Therapy (Signed)
[facility] [address], [place], [place], [address] Phone: [phone]   Fax:  [phone]  Physical Therapy Treatment  Patient Details  Name: Gary [name] MRN: [id number] Date of Birth: 04/22/1950 Referring Provider (PT): Karlton Lemon, MD   Encounter Date: 01/11/2018  PT End of Session - 01/11/18 1022    Visit Number  11    Date for PT Re-Evaluation  01/31/18    PT Start Time  1020    PT Stop Time  1112    PT Time Calculation (min)  52 min    Activity Tolerance  Patient tolerated treatment well    Behavior During Therapy  [facility] for tasks assessed/performed       Past Medical History:  Diagnosis Date  . Atrial flutter ([place]) 2007   s/p CTI ablation by Dr Lovena Le  . Back pain   . Hyperlipidemia   . Hypertension   . Insomnia   . NICM (nonischemic cardiomyopathy) ([place])    a. 2005 - tachy mediated. normalized after return of NSR.  [name] Obesity   . Paroxysmal atrial fibrillation [facility])     Past Surgical History:  Procedure Laterality Date  . atrial flutter ablation  06/10/2005   CTI ablation by Dr Lovena Le for atrial flutter  . CARDIOVERSION N/A 07/20/2016   Procedure: CARDIOVERSION;  Surgeon: Minus Breeding, MD;  Location: [facility] ENDOSCOPY;  Service: Cardiovascular;  Laterality: N/A;  . CARDIOVERSION N/A 12/13/2017   Procedure: CARDIOVERSION;  Surgeon: Acie Fredrickson Wonda Cheng, MD;  Location: [facility] ENDOSCOPY;  Service: Cardiovascular;  Laterality: N/A;  . NO PAST SURGERIES      There were no vitals filed for this visit.  Subjective Assessment - 01/11/18 1024    Subjective  I saw my neursurgeon yesterday. I can have a decompression/laminectomy if I want to decompress the nerve. Orders to finish out PT and he is also having dry needlling with Pryor Montes this Friday.     Currently in Pain?  No/denies    Multiple Pain Sites  No                       [facility] Adult PT Treatment/Exercise - 01/11/18 0001      Lumbar Exercises: Seated   Other Seated Lumbar Exercises  Seated on blue: alt UE/LE 1# added to arm, blue tband horizontal abd 2x15, diagonlas bil 15x with blue   issued blue band for HEP progression     Lumbar Exercises: Supine   Clam  --   With legs in hooklying red band 15x     Knee/Hip Exercises: Standing   Hip Flexion  Stengthening;Both;2 sets;10 reps;Knee straight   3#   Hip Abduction  Stengthening;Both;2 sets;10 reps;Knee straight   3# added   SLS  Blue pod bil 10 sec 3x each    Other Standing Knee Exercises  Floor glider 10x2 side, forward, back under RTLE: Under LT 10x2 side and forward      Traction   Type of Traction  Lumbar    Min (lbs)  80   Static   Time  10               PT Short Term Goals - 01/09/18 1357      PT SHORT TERM GOAL #1   Title  be independent in initial HEP    Time  4    Period  Weeks    Status  Achieved      PT  SHORT TERM GOAL #2   Title  report a 25% reduction in Lt LE pain with standing and walking     Time  4    Period  Weeks    Status  On-going   Some days maybe 10%-20% but nothing really consistent so far.       PT Long Term Goals - 01/09/18 1358      PT LONG TERM GOAL #4   Title  reduce Lt LE pain to walk for 15 minutes without limitation    Time  8    Period  Weeks    Status  On-going   Has not tried walking yet for exercise           Plan - 01/11/18 1100    Clinical Impression Statement  Pt saw his neurosurgeon yesterday. He was instructed to finish out his therapy and he can have a decompression surgery whenever he is ready. Last 24 hours have been good regarding his leg symtoms,. This weekend he will try a fitness walk to see if that has also improved. He was given red band hip ER in hooklying for HEP advancement.     Rehab Potential  Good    PT Frequency  2x / week    PT Duration  8 weeks    PT Treatment/Interventions  ADLs/Self Care Home Management;Cryotherapy;Electrical Stimulation;Traction;Moist  Heat;Functional mobility training;Therapeutic activities;Therapeutic exercise;Patient/family education;Neuromuscular re-education;Manual techniques;Passive range of motion;Taping;Dry needling    PT Next Visit Plan  Hip strength and stability in standing, see how his walk went.  Continue to work on completing HEP.     PT Home Exercise Plan  Access Code: [email or web address]     Consulted and Agree with Plan of Care  Patient       Patient will benefit from skilled therapeutic intervention in order to improve the following deficits and impairments:  Pain, Improper body mechanics, Impaired flexibility, Decreased activity tolerance, Decreased range of motion, Decreased strength, Postural dysfunction  Visit Diagnosis: Pain in left hip  Chronic left-sided low back pain with left-sided sciatica  Stiffness of left hip, not elsewhere classified  Stiffness of right hip, not elsewhere classified     Problem List Patient Active Problem List   Diagnosis Date Noted  . 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 Left hip pain 09/13/2016  . Family history of cardiovascular disease 05/21/2015  . Obesity   . Hyperlipidemia   . A-fib ([place])   . Atrial flutter ([place])   . Insomnia   . Heart disease   . Back pain   . Cardiomyopathy ([place])     Zamyra Allensworth, PTA 01/11/2018, 3:54 PM  [facility] [address] [place], [place], [address] Phone: [phone]   Fax:  [phone]  Name: Gary [name] MRN: [id number] Date of Birth: [birth year]

## 2018-01-11 NOTE — Progress Notes (Signed)
PCP: [name], MD Primary Cardiologist: Dr Irish Lack Primary EP: Dr Cena Benton is a 67 y.o. male who presents today for routine electrophysiology followup.  Since last being seen in our clinic, the patient reports doing reasonably well.  He went for over a year without afib.  Unfortunately, his afib returned in October after receiving steroids.  He was cardioverted but quickly returned to afib.  He remains in afib.  He has exertional SOB and fatigue but denies palpitations.  Today, he denies symptoms of  chest pain, shortness of breath,  lower extremity edema, dizziness, presyncope, or syncope.  The patient is otherwise without complaint today.   Past Medical History:  Diagnosis Date  . Atrial flutter ([place]) 2007   s/p CTI ablation by Dr Lovena Le  . Back pain   . Hyperlipidemia   . Hypertension   . Insomnia   . NICM (nonischemic cardiomyopathy) ([place])    a. 2005 - tachy mediated. normalized after return of NSR.  [name] Obesity   . Paroxysmal atrial fibrillation [facility])    Past Surgical History:  Procedure Laterality Date  . atrial flutter ablation  06/10/2005   CTI ablation by Dr Lovena Le for atrial flutter  . CARDIOVERSION N/A 07/20/2016   Procedure: CARDIOVERSION;  Surgeon: Minus Breeding, MD;  Location: [facility] ENDOSCOPY;  Service: Cardiovascular;  Laterality: N/A;  . CARDIOVERSION N/A 12/13/2017   Procedure: CARDIOVERSION;  Surgeon: Thayer Headings, MD;  Location: [facility] ENDOSCOPY;  Service: Cardiovascular;  Laterality: N/A;  . NO PAST SURGERIES      ROS- all systems are reviewed and negatives except as per HPI above  Current Outpatient Medications  Medication Sig Dispense Refill  . amitriptyline (ELAVIL) 50 MG tablet Take 50-75 mg by mouth at bedtime.     [name] atorvastatin (LIPITOR) 10 MG tablet Take 10 mg by mouth every evening.     . colchicine 0.6 MG tablet Take 0.6 mg by mouth daily as needed (FOR GOUT).     . cyclobenzaprine (FLEXERIL) 10 MG tablet Take 10 mg by mouth  daily as needed for muscle spasms.     [name] diltiazem (CARTIA XT) 180 MG 24 hr capsule Take 1 capsule (180 mg total) by mouth daily. (Patient taking differently: Take 180 mg by mouth every evening. ) 90 capsule 3  . ELIQUIS 5 MG TABS tablet TAKE 1 TABLET BY MOUTH TWICE DAILY 180 tablet 3  . flecainide (TAMBOCOR) 100 MG tablet Take 1 tablet (100 mg total) by mouth 2 (two) times daily. 180 tablet 1  . metoprolol succinate (TOPROL-XL) 25 MG 24 hr tablet Take 1 tablet (25 mg total) by mouth daily. 90 tablet 2  . Multiple Vitamin (MULTIVITAMIN WITH MINERALS) TABS tablet Take 1 tablet by mouth daily.    . Omega-3 Fatty Acids (FISH OIL) 1200 MG CAPS Take 1,200 mg by mouth 2 (two) times daily.     . traMADol (ULTRAM) 50 MG tablet Take 1 tablet (50 mg total) by mouth every 6 (six) hours as needed. (Patient taking differently: Take 50 mg by mouth every 6 (six) hours as needed (LEG PAIN). ) 20 tablet 0  . valACYclovir (VALTREX) 1000 MG tablet Take 1,000 mg by mouth daily as needed (FOR COLD SORES).     . vitamin E (VITAMIN E) 400 UNIT capsule Take 400 Units by mouth daily.     Current Facility-Administered Medications  Medication Dose Route Frequency Provider Last Rate Last Dose  . methylPREDNISolone acetate (DEPO-MEDROL) injection 40 mg  40 mg Intramuscular Once Thurman Coyer, DO        Physical Exam: Vitals:   01/11/18 1639  BP: 112/70  Pulse: 82  SpO2: 98%  Weight: 239 lb 6.4 oz (108.6 kg)  Height: 6' (1.829 m)    GEN- The patient is well appearing, alert and oriented x 3 today.   Head- normocephalic, atraumatic Eyes-  Sclera clear, conjunctiva pink Ears- hearing intact Oropharynx- clear Lungs- Clear to ausculation bilaterally, normal work of breathing Heart- irregular rate and rhythm, no murmurs, rubs or gallops, PMI not laterally displaced GI- soft, NT, ND, + BS Extremities- no clubbing, cyanosis, or edema  Wt Readings from Last 3 Encounters:  01/11/18 239 lb 6.4 oz (108.6 kg)    12/21/17 239 lb (108.4 kg)  12/05/17 230 lb (104.3 kg)    EKG tracing ordered today is personally reviewed and shows afib, V rate 92 bpm, Qtc 429 msec  Assessment and Plan:  1. Persistent afib The patient has symptomatic persistent afib He is appropriate anticoagulated.  Therapeutic strategies for afib including medicine (tikosyn) and ablation were discussed in detail with the patient today. Risk, benefits, and alternatives to EP study and radiofrequency ablation for afib were also discussed in detail today.  At this time, he is not ready to consider ablation.  He prefers to try cardioversion again.  I have advised that this is unlikely to be successful long term.  As per his request, we will arrange for cardioversion at the next available time.  Continue anticoagulation and flecainide without interruption.  Follow-up in AF clinic 2 weeks post cardioversion. If he fails cardioversion, then he would likely want to pursue ablation.  This could be set up from the AF clinic.   Of note, he has had difficulty passing TEE probes previously.    Thompson Grayer MD, [facility] 01/11/2018 4:51 PM

## 2018-01-11 NOTE — Patient Instructions (Addendum)
Medication Instructions:  Your physician recommends that you continue on your current medications as directed. Please refer to the Current Medication list given to you today.  * If you need a refill on your cardiac medications before your next appointment, please call your pharmacy.   Labwork: None ordered  Testing/Procedures: Your physician has recommended that you have a Cardioversion (DCCV). Electrical Cardioversion uses a jolt of electricity to your heart either through paddles or wired patches attached to your chest. This is a controlled, usually prescheduled, procedure. Defibrillation is done under light anesthesia in the hospital, and you usually go home the day of the procedure. This is done to get your heart back into a normal rhythm. You are not awake for the procedure.  The office will call you to arrange this procedure.   Follow-Up: Once cardioversion is scheduled we will arrange for you to follow up with Roderic Palau in the AFib clinic.  Thank you for choosing [facility]!!    Any Other Special Instructions Will Be Listed Below (If Applicable).

## 2018-01-11 NOTE — H&P (View-Only) (Signed)
PCP: [name], MD Primary Cardiologist: Dr Irish Lack Primary EP: Dr Cena Benton is a 67 y.o. male who presents today for routine electrophysiology followup.  Since last being seen in our clinic, the patient reports doing reasonably well.  He went for over a year without afib.  Unfortunately, his afib returned in October after receiving steroids.  He was cardioverted but quickly returned to afib.  He remains in afib.  He has exertional SOB and fatigue but denies palpitations.  Today, he denies symptoms of  chest pain, shortness of breath,  lower extremity edema, dizziness, presyncope, or syncope.  The patient is otherwise without complaint today.   Past Medical History:  Diagnosis Date  . Atrial flutter ([place]) 2007   s/p CTI ablation by Dr Lovena Le  . Back pain   . Hyperlipidemia   . Hypertension   . Insomnia   . NICM (nonischemic cardiomyopathy) ([place])    a. 2005 - tachy mediated. normalized after return of NSR.  [name] Obesity   . Paroxysmal atrial fibrillation [facility])    Past Surgical History:  Procedure Laterality Date  . atrial flutter ablation  06/10/2005   CTI ablation by Dr Lovena Le for atrial flutter  . CARDIOVERSION N/A 07/20/2016   Procedure: CARDIOVERSION;  Surgeon: Minus Breeding, MD;  Location: [facility] ENDOSCOPY;  Service: Cardiovascular;  Laterality: N/A;  . CARDIOVERSION N/A 12/13/2017   Procedure: CARDIOVERSION;  Surgeon: Thayer Headings, MD;  Location: [facility] ENDOSCOPY;  Service: Cardiovascular;  Laterality: N/A;  . NO PAST SURGERIES      ROS- all systems are reviewed and negatives except as per HPI above  Current Outpatient Medications  Medication Sig Dispense Refill  . amitriptyline (ELAVIL) 50 MG tablet Take 50-75 mg by mouth at bedtime.     [name] atorvastatin (LIPITOR) 10 MG tablet Take 10 mg by mouth every evening.     . colchicine 0.6 MG tablet Take 0.6 mg by mouth daily as needed (FOR GOUT).     . cyclobenzaprine (FLEXERIL) 10 MG tablet Take 10 mg by mouth  daily as needed for muscle spasms.     [name] diltiazem (CARTIA XT) 180 MG 24 hr capsule Take 1 capsule (180 mg total) by mouth daily. (Patient taking differently: Take 180 mg by mouth every evening. ) 90 capsule 3  . ELIQUIS 5 MG TABS tablet TAKE 1 TABLET BY MOUTH TWICE DAILY 180 tablet 3  . flecainide (TAMBOCOR) 100 MG tablet Take 1 tablet (100 mg total) by mouth 2 (two) times daily. 180 tablet 1  . metoprolol succinate (TOPROL-XL) 25 MG 24 hr tablet Take 1 tablet (25 mg total) by mouth daily. 90 tablet 2  . Multiple Vitamin (MULTIVITAMIN WITH MINERALS) TABS tablet Take 1 tablet by mouth daily.    . Omega-3 Fatty Acids (FISH OIL) 1200 MG CAPS Take 1,200 mg by mouth 2 (two) times daily.     . traMADol (ULTRAM) 50 MG tablet Take 1 tablet (50 mg total) by mouth every 6 (six) hours as needed. (Patient taking differently: Take 50 mg by mouth every 6 (six) hours as needed (LEG PAIN). ) 20 tablet 0  . valACYclovir (VALTREX) 1000 MG tablet Take 1,000 mg by mouth daily as needed (FOR COLD SORES).     . vitamin E (VITAMIN E) 400 UNIT capsule Take 400 Units by mouth daily.     Current Facility-Administered Medications  Medication Dose Route Frequency Provider Last Rate Last Dose  . methylPREDNISolone acetate (DEPO-MEDROL) injection 40 mg  40 mg Intramuscular Once Thurman Coyer, DO        Physical Exam: Vitals:   01/11/18 1639  BP: 112/70  Pulse: 82  SpO2: 98%  Weight: 239 lb 6.4 oz (108.6 kg)  Height: 6' (1.829 m)    GEN- The patient is well appearing, alert and oriented x 3 today.   Head- normocephalic, atraumatic Eyes-  Sclera clear, conjunctiva pink Ears- hearing intact Oropharynx- clear Lungs- Clear to ausculation bilaterally, normal work of breathing Heart- irregular rate and rhythm, no murmurs, rubs or gallops, PMI not laterally displaced GI- soft, NT, ND, + BS Extremities- no clubbing, cyanosis, or edema  Wt Readings from Last 3 Encounters:  01/11/18 239 lb 6.4 oz (108.6 kg)    12/21/17 239 lb (108.4 kg)  12/05/17 230 lb (104.3 kg)    EKG tracing ordered today is personally reviewed and shows afib, V rate 92 bpm, Qtc 429 msec  Assessment and Plan:  1. Persistent afib The patient has symptomatic persistent afib He is appropriate anticoagulated.  Therapeutic strategies for afib including medicine (tikosyn) and ablation were discussed in detail with the patient today. Risk, benefits, and alternatives to EP study and radiofrequency ablation for afib were also discussed in detail today.  At this time, he is not ready to consider ablation.  He prefers to try cardioversion again.  I have advised that this is unlikely to be successful long term.  As per his request, we will arrange for cardioversion at the next available time.  Continue anticoagulation and flecainide without interruption.  Follow-up in AF clinic 2 weeks post cardioversion. If he fails cardioversion, then he would likely want to pursue ablation.  This could be set up from the AF clinic.   Of note, he has had difficulty passing TEE probes previously.    Thompson Grayer MD, [facility] 01/11/2018 4:51 PM

## 2018-01-12 NOTE — Addendum Note (Signed)
Addended by: Rose Phi on: 01/12/2018 04:34 PM   Modules accepted: Orders

## 2018-01-13 ENCOUNTER — Telehealth: Payer: Self-pay

## 2018-01-13 NOTE — Telephone Encounter (Signed)
Outreach made to main number.  No answer.  Appears Pt is on MyChart, will send message and continue to monitor.

## 2018-01-16 ENCOUNTER — Ambulatory Visit: Payer: PPO | Admitting: Physical Therapy

## 2018-01-16 ENCOUNTER — Encounter: Payer: Self-pay | Admitting: Physical Therapy

## 2018-01-16 DIAGNOSIS — M25552 Pain in left hip: Secondary | ICD-10-CM | POA: Diagnosis not present

## 2018-01-16 DIAGNOSIS — M25652 Stiffness of left hip, not elsewhere classified: Secondary | ICD-10-CM

## 2018-01-16 DIAGNOSIS — M25651 Stiffness of right hip, not elsewhere classified: Secondary | ICD-10-CM

## 2018-01-16 DIAGNOSIS — G8929 Other chronic pain: Secondary | ICD-10-CM

## 2018-01-16 DIAGNOSIS — M5442 Lumbago with sciatica, left side: Secondary | ICD-10-CM

## 2018-01-16 NOTE — Therapy (Signed)
[facility] [address], [place], [place], [address] Phone: [phone]   Fax:  [phone]  Physical Therapy Treatment  Patient Details  Name: Gary [name] MRN: [id number] Date of Birth: 1950-12-22 Referring Provider (PT): Karlton Lemon, MD   Encounter Date: 01/16/2018  PT End of Session - 01/16/18 1022    Visit Number  12    Date for PT Re-Evaluation  01/31/18    PT Start Time  3790    PT Stop Time  1100    PT Time Calculation (min)  45 min    Activity Tolerance  Patient tolerated treatment well    Behavior During Therapy  [facility] for tasks assessed/performed       Past Medical History:  Diagnosis Date  . Atrial flutter ([place]) 2007   s/p CTI ablation by Dr Lovena Le  . Back pain   . Hyperlipidemia   . Hypertension   . Insomnia   . NICM (nonischemic cardiomyopathy) ([place])    a. 2005 - tachy mediated. normalized after return of NSR.  [name] Obesity   . Paroxysmal atrial fibrillation [facility])     Past Surgical History:  Procedure Laterality Date  . atrial flutter ablation  06/10/2005   CTI ablation by Dr Lovena Le for atrial flutter  . CARDIOVERSION N/A 07/20/2016   Procedure: CARDIOVERSION;  Surgeon: Minus Breeding, MD;  Location: [facility] ENDOSCOPY;  Service: Cardiovascular;  Laterality: N/A;  . CARDIOVERSION N/A 12/13/2017   Procedure: CARDIOVERSION;  Surgeon: Acie Fredrickson Wonda Cheng, MD;  Location: [facility] ENDOSCOPY;  Service: Cardiovascular;  Laterality: N/A;  . NO PAST SURGERIES      There were no vitals filed for this visit.  Subjective Assessment - 01/16/18 1034    Subjective  Did ok last week, then yesterday I had a bad day. I might have over done the weights. I did not get to walk.     Currently in Pain?  No/denies                       [facility] Adult PT Treatment/Exercise - 01/16/18 0001      Lumbar Exercises: Aerobic   Nustep  L5 x 10 min      Lumbar Exercises: Supine   Clam  --   LE in the air, green  band 2x10, issued green for home     Lumbar Exercises: Sidelying   Clam  --   Showed pt how to do in this position incase hooklying hurts     Knee/Hip Exercises: Standing   Hip Flexion  Stengthening;Both;2 sets;10 reps;Knee straight   3#   Hip Abduction  Stengthening;Both;2 sets;10 reps;Knee straight   3# added   SLS  Blue pod 3x 30 sec     Other Standing Knee Exercises  Floor glider 15x2 side, forward, back under RTLE: Under LT 10x2 side and forward      Traction   Type of Traction  Lumbar    Min (lbs)  80   Static   Time  10               PT Short Term Goals - 01/09/18 1357      PT SHORT TERM GOAL #1   Title  be independent in initial HEP    Time  4    Period  Weeks    Status  Achieved      PT SHORT TERM GOAL #2   Title  report a 25% reduction in Lt  LE pain with standing and walking     Time  4    Period  Weeks    Status  On-going   Some days maybe 10%-20% but nothing really consistent so far.       PT Long Term Goals - 01/09/18 1358      PT LONG TERM GOAL #4   Title  reduce Lt LE pain to walk for 15 minutes without limitation    Time  8    Period  Weeks    Status  On-going   Has not tried walking yet for exercise           Plan - 01/16/18 1022    Clinical Impression Statement  Pt had ggod days since last Wednesday up until yesterday. Yesterday he had a rough day with increased symtpoms in his Lt shin area. He was not able to walk this weekend so he had no comment on his ability to walk recreationally. We were able to increase his clamshells to green for more resistance, gave short green band for home. Pain did not limit any exercises during his session today. He also continues to tolerate 10 min of lumbar traction without LE symtpoms.     Rehab Potential  Good    PT Frequency  2x / week    PT Duration  8 weeks    PT Treatment/Interventions  ADLs/Self Care Home Management;Cryotherapy;Electrical Stimulation;Traction;Moist Heat;Functional mobility  training;Therapeutic activities;Therapeutic exercise;Patient/family education;Neuromuscular re-education;Manual techniques;Passive range of motion;Taping;Dry needling    PT Next Visit Plan  Hip strength and stability in standing, see how his walk went.  Continue to work on completing HEP.     PT Home Exercise Plan  Access Code: [email or web address]     Consulted and Agree with Plan of Care  Patient       Patient will benefit from skilled therapeutic intervention in order to improve the following deficits and impairments:  Pain, Improper body mechanics, Impaired flexibility, Decreased activity tolerance, Decreased range of motion, Decreased strength, Postural dysfunction  Visit Diagnosis: Pain in left hip  Chronic left-sided low back pain with left-sided sciatica  Stiffness of left hip, not elsewhere classified  Stiffness of right hip, not elsewhere classified     Problem List Patient Active Problem List   Diagnosis Date Noted  . 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 Left hip pain 09/13/2016  . Family history of cardiovascular disease 05/21/2015  . Obesity   . Hyperlipidemia   . A-fib ([place])   . Atrial flutter ([place])   . Insomnia   . Heart disease   . Back pain   . Cardiomyopathy ([place])     COCHRAN,JENNIFER, PTA 01/16/2018, 10:59 AM  [facility] [address], [place], [place], [address] Phone: [phone]   Fax:  [phone]  Name: Gary [name] MRN: [id number] Date of Birth: 01/28/1950

## 2018-01-16 NOTE — Telephone Encounter (Signed)
Spoke to pt about DCCV. Pt would like in January.  Pt aware I will arrange and call him end of this week/next with date/time/instructions. Pt agreeable to plan.

## 2018-01-20 ENCOUNTER — Ambulatory Visit: Payer: PPO | Admitting: Physical Therapy

## 2018-01-20 ENCOUNTER — Encounter: Payer: Self-pay | Admitting: Physical Therapy

## 2018-01-20 DIAGNOSIS — M25652 Stiffness of left hip, not elsewhere classified: Secondary | ICD-10-CM

## 2018-01-20 DIAGNOSIS — M5442 Lumbago with sciatica, left side: Secondary | ICD-10-CM

## 2018-01-20 DIAGNOSIS — G8929 Other chronic pain: Secondary | ICD-10-CM

## 2018-01-20 DIAGNOSIS — M25651 Stiffness of right hip, not elsewhere classified: Secondary | ICD-10-CM

## 2018-01-20 DIAGNOSIS — M25552 Pain in left hip: Secondary | ICD-10-CM | POA: Diagnosis not present

## 2018-01-20 NOTE — Therapy (Signed)
[facility] [address], [place], [place], [address] Phone: [phone]   Fax:  [phone]  Physical Therapy Treatment  Patient Details  Name: Gary [name] MRN: [id number] Date of Birth: 1950-06-01 Referring Provider (PT): Karlton Lemon, MD   Encounter Date: 01/20/2018  PT End of Session - 01/20/18 1020    Visit Number  13    Date for PT Re-Evaluation  01/31/18    PT Start Time  1017    PT Stop Time  1100    PT Time Calculation (min)  43 min    Activity Tolerance  Patient tolerated treatment well    Behavior During Therapy  [facility] for tasks assessed/performed       Past Medical History:  Diagnosis Date  . Atrial flutter ([place]) 2007   s/p CTI ablation by Dr Lovena Le  . Back pain   . Hyperlipidemia   . Hypertension   . Insomnia   . NICM (nonischemic cardiomyopathy) ([place])    a. 2005 - tachy mediated. normalized after return of NSR.  [name] Obesity   . Paroxysmal atrial fibrillation [facility])     Past Surgical History:  Procedure Laterality Date  . atrial flutter ablation  06/10/2005   CTI ablation by Dr Lovena Le for atrial flutter  . CARDIOVERSION N/A 07/20/2016   Procedure: CARDIOVERSION;  Surgeon: Minus Breeding, MD;  Location: [facility] ENDOSCOPY;  Service: Cardiovascular;  Laterality: N/A;  . CARDIOVERSION N/A 12/13/2017   Procedure: CARDIOVERSION;  Surgeon: Acie Fredrickson Wonda Cheng, MD;  Location: [facility] ENDOSCOPY;  Service: Cardiovascular;  Laterality: N/A;  . NO PAST SURGERIES      There were no vitals filed for this visit.  Subjective Assessment - 01/20/18 1051    Subjective  Did ok over Christmas, had one day where i had to lay down for awhile. Taking some Tylenol helps.    Currently in Pain?  No/denies    Multiple Pain Sites  No                       [facility] Adult PT Treatment/Exercise - 01/20/18 0001      Knee/Hip Exercises: Standing   Hip Flexion  Stengthening;Both;2 sets;10 reps;Knee straight   3#   Hip Abduction  Stengthening;Both;2 sets;10 reps;Knee straight   3# added, VC for increased control   SLS  Blue pod 3x 1 min     SLS with Vectors  Trail of SLS with body blade ossilations: PTA instructed pt in other tools he can use to challenge his core strength and hip stabiilazation as he goes forward.     Other Standing Knee Exercises  Floor glider 20x side, forward, back under RTLE: Under LT 10x2 side and forward      Traction   Type of Traction  Lumbar    Min (lbs)  80   Static   Time  10               PT Short Term Goals - 01/09/18 1357      PT SHORT TERM GOAL #1   Title  be independent in initial HEP    Time  4    Period  Weeks    Status  Achieved      PT SHORT TERM GOAL #2   Title  report a 25% reduction in Lt LE pain with standing and walking     Time  4    Period  Weeks    Status  On-going  Some days maybe 10%-20% but nothing really consistent so far.       PT Long Term Goals - 01/09/18 1358      PT LONG TERM GOAL #4   Title  reduce Lt LE pain to walk for 15 minutes without limitation    Time  8    Period  Weeks    Status  On-going   Has not tried walking yet for exercise           Plan - 01/20/18 1021    Clinical Impression Statement  Pt was able to walk 5 flights of stairs over the Christmas holiday with no hip pain just a "feeling" anterior hip. He is learning how to balance his exercises, rest, and change of position ( sit vs stand mainly) in order to manage his LT hip and leg symptoms.     Rehab Potential  Good    PT Frequency  2x / week    PT Duration  8 weeks    PT Treatment/Interventions  ADLs/Self Care Home Management;Cryotherapy;Electrical Stimulation;Traction;Moist Heat;Functional mobility training;Therapeutic activities;Therapeutic exercise;Patient/family education;Neuromuscular re-education;Manual techniques;Passive range of motion;Taping;Dry needling    PT Next Visit Plan  Evaluate for DC next visit, MMT hips    PT Home Exercise  Plan  Access Code: [email or web address]     Consulted and Agree with Plan of Care  Patient       Patient will benefit from skilled therapeutic intervention in order to improve the following deficits and impairments:  Pain, Improper body mechanics, Impaired flexibility, Decreased activity tolerance, Decreased range of motion, Decreased strength, Postural dysfunction  Visit Diagnosis: Pain in left hip  Chronic left-sided low back pain with left-sided sciatica  Stiffness of left hip, not elsewhere classified  Stiffness of right hip, not elsewhere classified     Problem List Patient Active Problem List   Diagnosis Date Noted  . 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 Left hip pain 09/13/2016  . Family history of cardiovascular disease 05/21/2015  . Obesity   . Hyperlipidemia   . A-fib ([place])   . Atrial flutter ([place])   . Insomnia   . Heart disease   . Back pain   . Cardiomyopathy ([place])     ,, PTA 01/20/2018, 10:53 AM  [facility] [address], [place], [place], [address] Phone: [phone]   Fax:  [phone]  Name: Gary [name] MRN: [id number] Date of Birth: [birth year]

## 2018-01-23 ENCOUNTER — Encounter: Payer: Self-pay | Admitting: *Deleted

## 2018-01-23 ENCOUNTER — Ambulatory Visit: Payer: PPO

## 2018-01-23 DIAGNOSIS — M25552 Pain in left hip: Secondary | ICD-10-CM

## 2018-01-23 DIAGNOSIS — M25652 Stiffness of left hip, not elsewhere classified: Secondary | ICD-10-CM

## 2018-01-23 DIAGNOSIS — G8929 Other chronic pain: Secondary | ICD-10-CM

## 2018-01-23 DIAGNOSIS — M5442 Lumbago with sciatica, left side: Secondary | ICD-10-CM

## 2018-01-23 DIAGNOSIS — M25651 Stiffness of right hip, not elsewhere classified: Secondary | ICD-10-CM

## 2018-01-23 DIAGNOSIS — M6281 Muscle weakness (generalized): Secondary | ICD-10-CM

## 2018-01-23 NOTE — Patient Instructions (Signed)

## 2018-01-23 NOTE — Therapy (Signed)
[facility] [address], [place], [place], [address] Phone: [phone]   Fax:  [phone]  Physical Therapy Treatment  Patient Details  Name: Gary [name] MRN: [id number] Date of Birth: Oct 14, 1950 Referring Provider (PT): Karlton Lemon, MD   Encounter Date: 01/23/2018  PT End of Session - 01/23/18 1156    Visit Number  15    Date for PT Re-Evaluation  03/06/18    PT Start Time  1100    PT Stop Time  1159    PT Time Calculation (min)  59 min    Activity Tolerance  Patient tolerated treatment well    Behavior During Therapy  [facility] for tasks assessed/performed       Past Medical History:  Diagnosis Date  . Atrial flutter ([place]) 2007   s/p CTI ablation by Dr Lovena Le  . Back pain   . Hyperlipidemia   . Hypertension   . Insomnia   . NICM (nonischemic cardiomyopathy) ([place])    a. 2005 - tachy mediated. normalized after return of NSR.  [name] Obesity   . Paroxysmal atrial fibrillation [facility])     Past Surgical History:  Procedure Laterality Date  . atrial flutter ablation  06/10/2005   CTI ablation by Dr Lovena Le for atrial flutter  . CARDIOVERSION N/A 07/20/2016   Procedure: CARDIOVERSION;  Surgeon: Minus Breeding, MD;  Location: [facility] ENDOSCOPY;  Service: Cardiovascular;  Laterality: N/A;  . CARDIOVERSION N/A 12/13/2017   Procedure: CARDIOVERSION;  Surgeon: Acie Fredrickson Wonda Cheng, MD;  Location: [facility] ENDOSCOPY;  Service: Cardiovascular;  Laterality: N/A;  . NO PAST SURGERIES      There were no vitals filed for this visit.  Subjective Assessment - 01/23/18 1059    Subjective  I feel 50-60% overall improvement in symptoms since the start of care.  I haven't had to take medication for 3 days.    Diagnostic tests  MRI of lumbar spine: facet degeneration and stenosis L5-S1, scoliosis    Patient Stated Goals  reduce Lt LE pain    Currently in Pain?  No/denies         [facility] PT Assessment - 01/23/18 0001      Assessment   Medical Diagnosis  Lt hip pain    Referring Provider (PT)  Karlton Lemon, MD      Observation/Other Assessments   Focus on Therapeutic Outcomes (FOTO)   46% limitation      Ambulation/Gait   Ambulation/Gait  Yes    Gait Pattern  Within Functional Limits                   [facility] Adult PT Treatment/Exercise - 01/23/18 0001      Lumbar Exercises: Aerobic   Stationary Bike  level 2x 20 minutes   PT present for FOTO and assessment     Traction   Type of Traction  Lumbar    Min (lbs)  80    Max (lbs)  80    Rest Time  --   static   Time  10      Manual Therapy   Manual Therapy  Soft tissue mobilization    Manual therapy comments  trigger point release to Lt gluteals and PA mobs L1-5 grade 2       Trigger Point Dry Needling - 01/23/18 1144    Consent Given?  Yes    Education Handout Provided  --   bil lumbar multifidi   Muscles Treated Lower Body  Gluteus minimus;Gluteus  maximus;Piriformis    Gluteus Maximus Response  Twitch response elicited;Palpable increased muscle length    Gluteus Minimus Response  Twitch response elicited;Palpable increased muscle length    Piriformis Response  Twitch response elicited;Palpable increased muscle length           PT Education - 01/23/18 1139    Education Details  DN info    Person(s) Educated  Patient    Methods  Explanation;Demonstration;Handout    Comprehension  Verbalized understanding;Returned demonstration       PT Short Term Goals - 01/23/18 1202      PT SHORT TERM GOAL #1   Title  be independent in initial HEP    Status  Achieved      PT SHORT TERM GOAL #2   Title  report a 25% reduction in Lt LE pain with standing and walking     Status  Achieved        PT Long Term Goals - 01/23/18 1107      PT LONG TERM GOAL #1   Title  be independent in advanced HEP    Time  6    Period  Weeks    Status  On-going    Target Date  03/06/18      PT LONG TERM GOAL #2   Title  report a 75% reduction in Lt LE  pain with standing and walking    Baseline  50-60%     Time  6    Period  Weeks    Status  Revised    Target Date  03/06/18      PT LONG TERM GOAL #3   Title  verbalize and demonstrate body mechanics modifications for lumbar protection with daily tasks    Status  Achieved      PT LONG TERM GOAL #4   Title  reduce Lt LE pain to walk for 30 minutes without limitation    Baseline  yesterday I walked 15 minutes    Status  Revised    Target Date  03/06/18      PT LONG TERM GOAL #5   Title  reduce FOTO to < or = to 35% limitation    Baseline  38%     Time  6    Period  Weeks    Status  On-going    Target Date  03/06/18            Plan - 01/23/18 1153    Clinical Impression Statement  Pt reports 50-60% overall improvement in Lt LE pain since the start of care.  Pt is no longer limited in sitting long periods. Pt is able to walk 10-15 minutes and then begins to have Lt LE pain.  Pt also reports that he is able to negotiate steps without limitation. Pt with tension and trigger points in the Lt gluteals and lumbar paraspinals and demonstrated improved tissue mobility and reduced gluteal tension after dry needling today.  Pt will work on HEP over the next 2 weeks as he is not able to come to PT and will resume 1x/wk for dry needling, traction and modifications of HEP as needed until 03/06/18.      Rehab Potential  Good    PT Frequency  2x / week    PT Duration  6 weeks    PT Treatment/Interventions  ADLs/Self Care Home Management;Cryotherapy;Electrical Stimulation;Traction;Moist Heat;Functional mobility training;Therapeutic activities;Therapeutic exercise;Patient/family education;Neuromuscular re-education;Manual techniques;Passive range of motion;Taping;Dry needling    PT Next Visit Plan  probable  1x/wk for dry needling, HEP advancement and traction.  MMT of hips.    PT Home Exercise Plan  Access Code: [email or web address]     Recommended Other Services  recert sent 16/10/96    Consulted and Agree  with Plan of Care  Patient       Patient will benefit from skilled therapeutic intervention in order to improve the following deficits and impairments:  Pain, Improper body mechanics, Impaired flexibility, Decreased activity tolerance, Decreased range of motion, Decreased strength, Postural dysfunction  Visit Diagnosis: Pain in left hip - Plan: PT plan of care cert/re-cert  Chronic left-sided low back pain with left-sided sciatica - Plan: PT plan of care cert/re-cert  Stiffness of left hip, not elsewhere classified - Plan: PT plan of care cert/re-cert  Stiffness of right hip, not elsewhere classified - Plan: PT plan of care cert/re-cert  Muscle weakness (generalized) - Plan: PT plan of care cert/re-cert     Problem List Patient Active Problem List   Diagnosis Date Noted  . 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 Left hip pain 09/13/2016  . Family history of cardiovascular disease 05/21/2015  . Obesity   . Hyperlipidemia   . A-fib ([place])   . Atrial flutter ([place])   . Insomnia   . Heart disease   . Back pain   . Cardiomyopathy [facility])     Sigurd Sos, PT 01/23/18 12:04 PM  [facility] [address] [place], [place], [address] Phone: [phone]   Fax:  [phone]  Name: Gary [name] MRN: [id number] Date of Birth: 02/18/1950

## 2018-01-24 ENCOUNTER — Other Ambulatory Visit: Payer: Self-pay

## 2018-01-24 DIAGNOSIS — I480 Paroxysmal atrial fibrillation: Secondary | ICD-10-CM

## 2018-01-24 MED ORDER — METOPROLOL SUCCINATE ER 25 MG PO TB24: 25.0000 mg | ORAL_TABLET | Freq: Every day | ORAL | 0 refills | Status: DC

## 2018-01-24 NOTE — Telephone Encounter (Addendum)
Spoke to Gary [name]. Verbal understanding of procedure instructions conveyed by Gary [name]. Gary [name] appreciates the call  Also made Gary [name] aware Dr. Irish Lack agreeable to moving f/u w/ him out 6 months.  Gary [name] aware he will receive letter in the mail when it is time to scheduled with [place].

## 2018-01-29 NOTE — Anesthesia Preprocedure Evaluation (Addendum)
Anesthesia Evaluation  Patient identified by MRN, date of birth, ID band Patient awake    Reviewed: Allergy & Precautions, NPO status , Patient's Chart, lab work & pertinent test results  History of Anesthesia Complications Negative for: history of anesthetic complications  Airway Mallampati: II  TM Distance: >3 FB Neck ROM: Full    Dental  (+) Teeth Intact, Dental Advisory Given   Pulmonary former smoker,    Pulmonary exam normal breath sounds clear to auscultation       Cardiovascular hypertension, Pt. on medications and Pt. on home beta blockers Normal cardiovascular exam+ dysrhythmias (s/p multiple cardioversions) Atrial Fibrillation  Rhythm:Irregular Rate:Normal  TTE 10/19: EF 55-60%, trivial MR, mild LAE, trivial TR     Neuro/Psych negative neurological ROS     GI/Hepatic Neg liver ROS, GERD  Controlled and Medicated,  Endo/Other  negative endocrine ROS  Renal/GU negative Renal ROS     Musculoskeletal negative musculoskeletal ROS (+)   Abdominal   Peds  Hematology negative hematology ROS (+)   Anesthesia Other Findings Day of surgery medications reviewed with the patient.  Reproductive/Obstetrics                            Anesthesia Physical Anesthesia Plan  ASA: III  Anesthesia Plan: General   Post-op Pain Management:    Induction: Intravenous  PONV Risk Score and Plan: 2 and Treatment may vary due to age or medical condition and Propofol infusion  Airway Management Planned: Mask  Additional Equipment:   Intra-op Plan:   Post-operative Plan:   Informed Consent: I have reviewed the patients History and Physical, chart, labs and discussed the procedure including the risks, benefits and alternatives for the proposed anesthesia with the patient or authorized representative who has indicated his/her understanding and acceptance.   Dental advisory given  Plan Discussed  with: CRNA  Anesthesia Plan Comments:       Anesthesia Quick Evaluation

## 2018-01-30 ENCOUNTER — Ambulatory Visit (HOSPITAL_COMMUNITY): Payer: PPO | Admitting: Anesthesiology

## 2018-01-30 ENCOUNTER — Ambulatory Visit (HOSPITAL_COMMUNITY)
Admission: RE | Admit: 2018-01-30 | Discharge: 2018-01-30 | Disposition: A | Discharge status: Home / Self Care | Payer: PPO | Attending: Internal Medicine | Admitting: Internal Medicine

## 2018-01-30 ENCOUNTER — Encounter (HOSPITAL_COMMUNITY)
Admission: RE | Disposition: A | Discharge status: Home / Self Care | Payer: Self-pay | Source: Home / Self Care | Attending: Internal Medicine

## 2018-01-30 ENCOUNTER — Encounter (HOSPITAL_COMMUNITY): Payer: Self-pay

## 2018-01-30 DIAGNOSIS — E785 Hyperlipidemia, unspecified: Secondary | ICD-10-CM | POA: Diagnosis not present

## 2018-01-30 DIAGNOSIS — I4892 Unspecified atrial flutter: Secondary | ICD-10-CM | POA: Insufficient documentation

## 2018-01-30 DIAGNOSIS — Z7901 Long term (current) use of anticoagulants: Secondary | ICD-10-CM | POA: Insufficient documentation

## 2018-01-30 DIAGNOSIS — Z87891 Personal history of nicotine dependence: Secondary | ICD-10-CM | POA: Diagnosis not present

## 2018-01-30 DIAGNOSIS — I4819 Other persistent atrial fibrillation: Secondary | ICD-10-CM | POA: Diagnosis not present

## 2018-01-30 DIAGNOSIS — Z8249 Family history of ischemic heart disease and other diseases of the circulatory system: Secondary | ICD-10-CM | POA: Diagnosis not present

## 2018-01-30 DIAGNOSIS — G4700 Insomnia, unspecified: Secondary | ICD-10-CM | POA: Insufficient documentation

## 2018-01-30 DIAGNOSIS — I10 Essential (primary) hypertension: Secondary | ICD-10-CM | POA: Diagnosis not present

## 2018-01-30 DIAGNOSIS — E669 Obesity, unspecified: Secondary | ICD-10-CM | POA: Diagnosis not present

## 2018-01-30 DIAGNOSIS — I428 Other cardiomyopathies: Secondary | ICD-10-CM | POA: Insufficient documentation

## 2018-01-30 DIAGNOSIS — K219 Gastro-esophageal reflux disease without esophagitis: Secondary | ICD-10-CM | POA: Insufficient documentation

## 2018-01-30 DIAGNOSIS — I4891 Unspecified atrial fibrillation: Secondary | ICD-10-CM

## 2018-01-30 DIAGNOSIS — Z79899 Other long term (current) drug therapy: Secondary | ICD-10-CM | POA: Diagnosis not present

## 2018-01-30 DIAGNOSIS — Z6830 Body mass index (BMI) 30.0-30.9, adult: Secondary | ICD-10-CM | POA: Insufficient documentation

## 2018-01-30 DIAGNOSIS — Z880 Allergy status to penicillin: Secondary | ICD-10-CM | POA: Insufficient documentation

## 2018-01-30 DIAGNOSIS — I429 Cardiomyopathy, unspecified: Secondary | ICD-10-CM | POA: Diagnosis not present

## 2018-01-30 HISTORY — PX: CARDIOVERSION: SHX1299

## 2018-01-30 LAB — POCT I-STAT 4, (NA,K, GLUC, HGB,HCT)
Glucose, Bld: 114 mg/dL — ABNORMAL HIGH (ref 70–99)
HCT: 49 % (ref 39.0–52.0)
Hemoglobin: 16.7 g/dL (ref 13.0–17.0)
Potassium: 4.4 mmol/L (ref 3.5–5.1)
Sodium: 140 mmol/L (ref 135–145)

## 2018-01-30 SURGERY — CARDIOVERSION
Anesthesia: General

## 2018-01-30 MED ORDER — SODIUM CHLORIDE 0.9 % IV SOLN
INTRAVENOUS | Status: DC | PRN
  Administered 2018-01-30: 10:00:00 via INTRAVENOUS

## 2018-01-30 MED ORDER — PROPOFOL 10 MG/ML IV BOLUS
INTRAVENOUS | Status: DC | PRN
  Administered 2018-01-30: 100 mg via INTRAVENOUS

## 2018-01-30 MED ORDER — LIDOCAINE 2% (20 MG/ML) 5 ML SYRINGE
INTRAMUSCULAR | Status: DC | PRN
  Administered 2018-01-30: 40 mg via INTRAVENOUS

## 2018-01-30 NOTE — Anesthesia Postprocedure Evaluation (Signed)
Anesthesia Post Note  Patient: Gary [name]  Procedure(s) Performed: CARDIOVERSION (N/A )     Patient location during evaluation: PACU Anesthesia Type: General Level of consciousness: awake and alert Pain management: pain level controlled Vital Signs Assessment: post-procedure vital signs reviewed and stable Respiratory status: spontaneous breathing, nonlabored ventilation and respiratory function stable Cardiovascular status: blood pressure returned to baseline and stable Postop Assessment: no apparent nausea or vomiting Anesthetic complications: no    Last Vitals:  Vitals:   01/30/18 0943  BP: 121/74  Pulse: 92  Resp: 20  Temp: 37.1 C  SpO2: 98%    Last Pain:  Vitals:   01/30/18 0943  TempSrc: Oral  PainSc: 0-No pain                 Brennan Bailey

## 2018-01-30 NOTE — Interval H&P Note (Signed)
History and Physical Interval Note:  01/30/2018 9:31 AM  [name]  has presented today for surgery, with the diagnosis of atrial fibrillation  The various methods of treatment have been discussed with the patient and family. After consideration of risks, benefits and other options for treatment, the patient has consented to  Procedure(s): CARDIOVERSION (N/A) as a surgical intervention .  The patient's history has been reviewed, patient examined, no change in status, stable for surgery.  I have reviewed the patient's chart and labs.  Questions were answered to the patient's satisfaction.     Dorris Carnes

## 2018-01-30 NOTE — Transfer of Care (Signed)
Immediate Anesthesia Transfer of Care Note  Patient: Gary [name]  Procedure(s) Performed: CARDIOVERSION (N/A )  Patient Location: Endoscopy Unit  Anesthesia Type:General  Level of Consciousness: drowsy and responds to stimulation  Airway & Oxygen Therapy: Patient Spontanous Breathing  Post-op Assessment: Report given to RN  Post vital signs: Reviewed and stable  Last Vitals:  Vitals Value Taken Time  BP    Temp    Pulse    Resp    SpO2      Last Pain:  Vitals:   01/30/18 0943  TempSrc: Oral  PainSc: 0-No pain         Complications: No apparent anesthesia complications

## 2018-01-30 NOTE — Discharge Instructions (Signed)
Electrical Cardioversion, Care After °This sheet gives you information about how to care for yourself after your procedure. Your health care provider may also give you more specific instructions. If you have problems or questions, contact your health care provider. °What can I expect after the procedure? °After the procedure, it is common to have: °· Some redness on the skin where the shocks were given. °Follow these instructions at home: ° °· Do not drive for 24 hours if you were given a medicine to help you relax (sedative). °· Take over-the-counter and prescription medicines only as told by your health care provider. °· Ask your health care provider how to check your pulse. Check it often. °· Rest for 48 hours after the procedure or as told by your health care provider. °· Avoid or limit your caffeine use as told by your health care provider. °Contact a health care provider if: °· You feel like your heart is beating too quickly or your pulse is not regular. °· You have a serious muscle cramp that does not go away. °Get help right away if: ° °· You have discomfort in your chest. °· You are dizzy or you feel faint. °· You have trouble breathing or you are short of breath. °· Your speech is slurred. °· You have trouble moving an arm or leg on one side of your body. °· Your fingers or toes turn cold or blue. °This information is not intended to replace advice given to you by your health care provider. Make sure you discuss any questions you have with your health care provider. °Document Released: 11/01/2012 Document Revised: 08/15/2015 Document Reviewed: 07/18/2015 °Elsevier Interactive Patient Education © 2019 [employer] ° °

## 2018-01-30 NOTE — CV Procedure (Signed)
Cardioversion  Pt sedated by anesthesia with Propofol IV  With pads in the AP position cardioversion attempted with 200 J synchronized biphasic energy   Not successful  With pads remaining in AP position, chest pressure applied   200 J synchronized biphasic energy delivered with successful cardioversion to SR  12 lead EKG pending

## 2018-02-01 ENCOUNTER — Ambulatory Visit: Payer: PPO | Attending: Family Medicine

## 2018-02-01 DIAGNOSIS — M5442 Lumbago with sciatica, left side: Secondary | ICD-10-CM | POA: Diagnosis not present

## 2018-02-01 DIAGNOSIS — M25651 Stiffness of right hip, not elsewhere classified: Secondary | ICD-10-CM

## 2018-02-01 DIAGNOSIS — M25652 Stiffness of left hip, not elsewhere classified: Secondary | ICD-10-CM | POA: Diagnosis not present

## 2018-02-01 DIAGNOSIS — M6281 Muscle weakness (generalized): Secondary | ICD-10-CM | POA: Diagnosis not present

## 2018-02-01 DIAGNOSIS — M25552 Pain in left hip: Secondary | ICD-10-CM

## 2018-02-01 DIAGNOSIS — G8929 Other chronic pain: Secondary | ICD-10-CM | POA: Insufficient documentation

## 2018-02-01 NOTE — Therapy (Signed)
[facility] [address], [place], [place], [address] Phone: [phone]   Fax:  [phone]  Physical Therapy Treatment  Patient Details  Name: Gary [name] MRN: [id number] Date of Birth: 1950-08-28 Referring Provider (PT): Karlton Lemon, MD   Encounter Date: 02/01/2018  PT End of Session - 02/01/18 1221    Visit Number  16    Date for PT Re-Evaluation  03/06/18    PT Start Time  0045    PT Stop Time  1231    PT Time Calculation (min)  43 min    Activity Tolerance  Patient tolerated treatment well    Behavior During Therapy  [facility] for tasks assessed/performed       Past Medical History:  Diagnosis Date  . Atrial flutter ([place]) 2007   s/p CTI ablation by Dr Lovena Le  . Back pain   . Hyperlipidemia   . Hypertension   . Insomnia   . NICM (nonischemic cardiomyopathy) ([place])    a. 2005 - tachy mediated. normalized after return of NSR.  [name] Obesity   . Paroxysmal atrial fibrillation [facility])     Past Surgical History:  Procedure Laterality Date  . atrial flutter ablation  06/10/2005   CTI ablation by Dr Lovena Le for atrial flutter  . CARDIOVERSION N/A 07/20/2016   Procedure: CARDIOVERSION;  Surgeon: Minus Breeding, MD;  Location: [facility] ENDOSCOPY;  Service: Cardiovascular;  Laterality: N/A;  . CARDIOVERSION N/A 12/13/2017   Procedure: CARDIOVERSION;  Surgeon: Acie Fredrickson Wonda Cheng, MD;  Location: [facility] ENDOSCOPY;  Service: Cardiovascular;  Laterality: N/A;  . NO PAST SURGERIES      There were no vitals filed for this visit.  Subjective Assessment - 02/01/18 1148    Subjective  I had cardioversion on Monday.  I was not allowed to do a lot of exercise during the day before or after.  I have been busy helping my family member who is having surgery next week.  I haven't done much activity since I was here last.      Diagnostic tests  MRI of lumbar spine: facet degeneration and stenosis L5-S1, scoliosis    Patient Stated Goals   reduce Lt LE pain    Currently in Pain?  No/denies    Aggravating Factors   standing long periods    Pain Relieving Factors  exercise, sitting down                       [facility] Adult PT Treatment/Exercise - 02/01/18 0001      Lumbar Exercises: Aerobic   Nustep  L5 x 10 min   PT present to discuss progress      Traction   Type of Traction  Lumbar    Min (lbs)  80    Rest Time  --   static   Time  10      Manual Therapy   Manual Therapy  Soft tissue mobilization    Manual therapy comments  trigger point release to Lt gluteals and PA mobs L1-5 grade 2       Trigger Point Dry Needling - 02/01/18 1152    Consent Given?  Yes    Muscles Treated Lower Body  Gluteus minimus;Gluteus maximus;Piriformis   bil lumbar multifidi, Lt  gluteals, Lt ankle everters   Gluteus Maximus Response  Twitch response elicited;Palpable increased muscle length    Gluteus Minimus Response  Twitch response elicited;Palpable increased muscle length    Piriformis Response  Twitch response elicited;Palpable increased muscle length             PT Short Term Goals - 01/23/18 1202      PT SHORT TERM GOAL #1   Title  be independent in initial HEP    Status  Achieved      PT SHORT TERM GOAL #2   Title  report a 25% reduction in Lt LE pain with standing and walking     Status  Achieved        PT Long Term Goals - 01/23/18 1107      PT LONG TERM GOAL #1   Title  be independent in advanced HEP    Time  6    Period  Weeks    Status  On-going    Target Date  03/06/18      PT LONG TERM GOAL #2   Title  report a 75% reduction in Lt LE pain with standing and walking    Baseline  50-60%     Time  6    Period  Weeks    Status  Revised    Target Date  03/06/18      PT LONG TERM GOAL #3   Title  verbalize and demonstrate body mechanics modifications for lumbar protection with daily tasks    Status  Achieved      PT LONG TERM GOAL #4   Title  reduce Lt LE pain to walk for 30  minutes without limitation    Baseline  yesterday I walked 15 minutes    Status  Revised    Target Date  03/06/18      PT LONG TERM GOAL #5   Title  reduce FOTO to < or = to 35% limitation    Baseline  38%     Time  6    Period  Weeks    Status  On-going    Target Date  03/06/18            Plan - 02/01/18 1226    Clinical Impression Statement  Pt reports 50-60% overall improvement in Lt LE pain since the start of care.  Pt is no longer limited in sitting long periods. Pt is able to walk 10-15 minutes and then begins to have Lt LE pain.  Pt has not been able to exercise over the past week and plans to return to exercise today.  Pt with tension and trigger points in lumbar paraspinals and Lt lateral ankle muscles and demonstrated improved tissue mobility and reduced tension after dry needling today.  Pt will continue to benefit from skilled PT for strength, dry needling/manual therapy and traction to address stenosis.         Rehab Potential  Good    PT Frequency  2x / week    PT Duration  6 weeks    PT Treatment/Interventions  ADLs/Self Care Home Management;Cryotherapy;Electrical Stimulation;Traction;Moist Heat;Functional mobility training;Therapeutic activities;Therapeutic exercise;Patient/family education;Neuromuscular re-education;Manual techniques;Passive range of motion;Taping;Dry needling    PT Next Visit Plan  probable 1x/wk for dry needling, HEP advancement and traction.  MMT of hips.    PT Home Exercise Plan  Access Code: [email or web address]     Recommended Other Services  cert and recert signed    Consulted and Agree with Plan of Care  Patient       Patient will benefit from skilled therapeutic intervention in order to improve the following deficits and impairments:  Pain, Improper body mechanics, Impaired flexibility, Decreased activity  tolerance, Decreased range of motion, Decreased strength, Postural dysfunction  Visit Diagnosis: Pain in left hip  Chronic left-sided low back  pain with left-sided sciatica  Stiffness of left hip, not elsewhere classified  Stiffness of right hip, not elsewhere classified  Muscle weakness (generalized)     Problem List Patient Active Problem List   Diagnosis Date Noted  . 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 Left hip pain 09/13/2016  . Family history of cardiovascular disease 05/21/2015  . Obesity   . Hyperlipidemia   . A-fib ([place])   . Atrial flutter ([place])   . Insomnia   . Heart disease   . Back pain   . Cardiomyopathy [facility])     Sigurd Sos, PT 02/01/18 12:28 PM  [facility] [address], [place], [place], [address] Phone: [phone]   Fax:  [phone]  Name: NESTA KIMPLE MRN: [id number] Date of Birth: 1950/11/22

## 2018-02-08 ENCOUNTER — Ambulatory Visit: Payer: PPO

## 2018-02-08 DIAGNOSIS — M25552 Pain in left hip: Secondary | ICD-10-CM | POA: Diagnosis not present

## 2018-02-08 DIAGNOSIS — G8929 Other chronic pain: Secondary | ICD-10-CM

## 2018-02-08 DIAGNOSIS — M25652 Stiffness of left hip, not elsewhere classified: Secondary | ICD-10-CM

## 2018-02-08 DIAGNOSIS — M6281 Muscle weakness (generalized): Secondary | ICD-10-CM

## 2018-02-08 DIAGNOSIS — M5442 Lumbago with sciatica, left side: Secondary | ICD-10-CM

## 2018-02-08 DIAGNOSIS — M25651 Stiffness of right hip, not elsewhere classified: Secondary | ICD-10-CM

## 2018-02-08 NOTE — Therapy (Signed)
[facility] [address] [place], [place], [address] Phone: [phone]   Fax:  [phone]  Physical Therapy Treatment  Patient Details  Name: Gary [name] MRN: [id number] Date of Birth: 1951-01-07 Referring Provider (PT): Karlton Lemon, MD   Encounter Date: 02/08/2018  PT End of Session - 02/08/18 1225    Visit Number  17    Date for PT Re-Evaluation  03/06/18    PT Start Time  1149    PT Stop Time  1237    PT Time Calculation (min)  48 min    Activity Tolerance  Patient tolerated treatment well    Behavior During Therapy  [facility] for tasks assessed/performed       Past Medical History:  Diagnosis Date  . Atrial flutter ([place]) 2007   s/p CTI ablation by Dr Lovena Le  . Back pain   . Hyperlipidemia   . Hypertension   . Insomnia   . NICM (nonischemic cardiomyopathy) ([place])    a. 2005 - tachy mediated. normalized after return of NSR.  [name] Obesity   . Paroxysmal atrial fibrillation [facility])     Past Surgical History:  Procedure Laterality Date  . atrial flutter ablation  06/10/2005   CTI ablation by Dr Lovena Le for atrial flutter  . CARDIOVERSION N/A 07/20/2016   Procedure: CARDIOVERSION;  Surgeon: Minus Breeding, MD;  Location: [facility] ENDOSCOPY;  Service: Cardiovascular;  Laterality: N/A;  . CARDIOVERSION N/A 12/13/2017   Procedure: CARDIOVERSION;  Surgeon: Thayer Headings, MD;  Location: [facility] ENDOSCOPY;  Service: Cardiovascular;  Laterality: N/A;  . CARDIOVERSION N/A 01/30/2018   Procedure: CARDIOVERSION;  Surgeon: Fay Records, MD;  Location: [place];  Service: Cardiovascular;  Laterality: N/A;  . NO PAST SURGERIES      There were no vitals filed for this visit.  Subjective Assessment - 02/08/18 1148    Subjective  I sat in the hospital for an entire day with my relative who had knee surgery. I didn't have any increase in pain.  I don't have nerve pain today.      Diagnostic tests  MRI of lumbar spine: facet  degeneration and stenosis L5-S1, scoliosis    Patient Stated Goals  reduce Lt LE pain    Currently in Pain?  No/denies                       [facility] Adult PT Treatment/Exercise - 02/08/18 0001      Lumbar Exercises: Aerobic   Stationary Bike  level 2x 10 minutes   PT present for FOTO and assessment     Traction   Type of Traction  Lumbar    Min (lbs)  85    Rest Time  --   static   Time  10      Manual Therapy   Manual Therapy  Soft tissue mobilization    Manual therapy comments  trigger point release to Lt gluteals and PA mobs L1-5 grade 2       Trigger Point Dry Needling - 02/08/18 1155    Consent Given?  Yes    Muscles Treated Lower Body  Gluteus minimus;Gluteus maximus;Piriformis   Lt gluteals, bil lumbar multifidi   Gluteus Maximus Response  Twitch response elicited;Palpable increased muscle length    Gluteus Minimus Response  Twitch response elicited;Palpable increased muscle length    Piriformis Response  Twitch response elicited;Palpable increased muscle length  PT Short Term Goals - 01/23/18 1202      PT SHORT TERM GOAL #1   Title  be independent in initial HEP    Status  Achieved      PT SHORT TERM GOAL #2   Title  report a 25% reduction in Lt LE pain with standing and walking     Status  Achieved        PT Long Term Goals - 02/08/18 1157      PT LONG TERM GOAL #2   Title  report a 75% reduction in Lt LE pain with standing and walking    Baseline  50-60%     Time  6    Period  Weeks    Status  On-going      PT LONG TERM GOAL #3   Title  verbalize and demonstrate body mechanics modifications for lumbar protection with daily tasks    Status  Achieved      PT LONG TERM GOAL #4   Title  reduce Lt LE pain to walk for 30 minutes without limitation            Plan - 02/08/18 1204    Clinical Impression Statement  Pt has returned to consistently with HEP this week.  Pt was able to sit for an extended period  without increased Lt LE pain.  Pt with continued tension and trigger points in Lt gluteals and lumbar paraspinals.  Pt reports increased Lt LE pain (5-6/10) with standing and is shifting to the Rt LE.  Pt has not tried doing any extended walking.  Pt reports that gains have been slow and he is able to lay on Lt side x 10 minutes and is much stronger. Pt with improved tissue mobility in Lt gluteals and lumbar paraspinals with dry needling and demonstrated increased ease of tissue mobility with manual therapy.   Pt will continue to benefit from skilled PT for manual therapy, strength and traction to address Lt LE pain.      Rehab Potential  Good    PT Frequency  2x / week    PT Duration  6 weeks    PT Treatment/Interventions  ADLs/Self Care Home Management;Cryotherapy;Electrical Stimulation;Traction;Moist Heat;Functional mobility training;Therapeutic activities;Therapeutic exercise;Patient/family education;Neuromuscular re-education;Manual techniques;Passive range of motion;Taping;Dry needling    PT Next Visit Plan  probable 1x/wk for dry needling, HEP advancement and traction.     PT Home Exercise Plan  Access Code: [email or web address]     Consulted and Agree with Plan of Care  Patient       Patient will benefit from skilled therapeutic intervention in order to improve the following deficits and impairments:  Pain, Improper body mechanics, Impaired flexibility, Decreased activity tolerance, Decreased range of motion, Decreased strength, Postural dysfunction  Visit Diagnosis: Pain in left hip  Chronic left-sided low back pain with left-sided sciatica  Stiffness of left hip, not elsewhere classified  Stiffness of right hip, not elsewhere classified  Muscle weakness (generalized)     Problem List Patient Active Problem List   Diagnosis Date Noted  . 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 Left hip pain 09/13/2016  . Family history of cardiovascular disease 05/21/2015  . Obesity   .  Hyperlipidemia   . A-fib ([place])   . Atrial flutter ([place])   . Insomnia   . Heart disease   . Back pain   . Cardiomyopathy [facility])     Sigurd Sos, PT 02/08/18 12:27 PM  [facility] [address], [place], [place], [address] Phone: [phone]   Fax:  [phone]  Name: KEIONTE SWICEGOOD MRN: [id number] Date of Birth: 04/01/1950

## 2018-02-13 ENCOUNTER — Encounter (HOSPITAL_COMMUNITY): Payer: Self-pay

## 2018-02-13 ENCOUNTER — Encounter (HOSPITAL_COMMUNITY): Payer: Self-pay | Admitting: Nurse Practitioner

## 2018-02-13 ENCOUNTER — Ambulatory Visit: Payer: PPO

## 2018-02-13 ENCOUNTER — Ambulatory Visit (HOSPITAL_COMMUNITY)
Admission: RE | Admit: 2018-02-13 | Discharge: 2018-02-13 | Disposition: A | Discharge status: Home / Self Care | Payer: PPO | Source: Ambulatory Visit | Attending: Nurse Practitioner | Admitting: Nurse Practitioner

## 2018-02-13 VITALS — BP 112/72 | HR 79 | Ht 72.0 in | Wt 236.0 lb

## 2018-02-13 DIAGNOSIS — M25552 Pain in left hip: Secondary | ICD-10-CM | POA: Diagnosis not present

## 2018-02-13 DIAGNOSIS — R9431 Abnormal electrocardiogram [ECG] [EKG]: Secondary | ICD-10-CM | POA: Insufficient documentation

## 2018-02-13 DIAGNOSIS — E785 Hyperlipidemia, unspecified: Secondary | ICD-10-CM | POA: Diagnosis not present

## 2018-02-13 DIAGNOSIS — I10 Essential (primary) hypertension: Secondary | ICD-10-CM | POA: Diagnosis not present

## 2018-02-13 DIAGNOSIS — Z880 Allergy status to penicillin: Secondary | ICD-10-CM | POA: Insufficient documentation

## 2018-02-13 DIAGNOSIS — I428 Other cardiomyopathies: Secondary | ICD-10-CM | POA: Insufficient documentation

## 2018-02-13 DIAGNOSIS — I4819 Other persistent atrial fibrillation: Secondary | ICD-10-CM

## 2018-02-13 DIAGNOSIS — E669 Obesity, unspecified: Secondary | ICD-10-CM | POA: Diagnosis not present

## 2018-02-13 DIAGNOSIS — I4892 Unspecified atrial flutter: Secondary | ICD-10-CM | POA: Insufficient documentation

## 2018-02-13 DIAGNOSIS — Z8249 Family history of ischemic heart disease and other diseases of the circulatory system: Secondary | ICD-10-CM | POA: Insufficient documentation

## 2018-02-13 DIAGNOSIS — Z87891 Personal history of nicotine dependence: Secondary | ICD-10-CM | POA: Insufficient documentation

## 2018-02-13 DIAGNOSIS — Z79899 Other long term (current) drug therapy: Secondary | ICD-10-CM | POA: Diagnosis not present

## 2018-02-13 DIAGNOSIS — Z6832 Body mass index (BMI) 32.0-32.9, adult: Secondary | ICD-10-CM | POA: Insufficient documentation

## 2018-02-13 DIAGNOSIS — M25652 Stiffness of left hip, not elsewhere classified: Secondary | ICD-10-CM

## 2018-02-13 DIAGNOSIS — M25651 Stiffness of right hip, not elsewhere classified: Secondary | ICD-10-CM

## 2018-02-13 DIAGNOSIS — Z7901 Long term (current) use of anticoagulants: Secondary | ICD-10-CM | POA: Diagnosis not present

## 2018-02-13 DIAGNOSIS — M5442 Lumbago with sciatica, left side: Secondary | ICD-10-CM

## 2018-02-13 DIAGNOSIS — G8929 Other chronic pain: Secondary | ICD-10-CM

## 2018-02-13 DIAGNOSIS — M6281 Muscle weakness (generalized): Secondary | ICD-10-CM

## 2018-02-13 NOTE — Progress Notes (Signed)
Primary Care Physician: [name], MD Referring Physician: [name] Cardiologist: [name] is a 68 y.o. male with a h/o afib on flecainide for years. He had breakthrough afib after stress over a family matter and too much alcohol. He had not been on anticoagulation and eliquis was restarted. TEE guided cardioversion was tried but the tube could not be passed. He was referred to [name]. He suggested several more weeks of anticoagulation and then cardioversion. This was done 07/20/16 and was successful and pt has been in SR since then.  He recently went back into afib after receiving a cortisone shot and oral prednisone for a back issue, "pinched nerve" which is still causing him pain  He is rate controlled. He missed anticoagulation for the back injection but has been back on anticoagulation since 10/25. He does have a chadsvasc score of 2(htn, age). He denies any alcohol use. Echo was recently updated without any significant change.He is taking 800 mg motrin at hs for the nerve pain so he can sleep. We discussed that this puts him at risk for GI bleed with his current use of eliquis.  F/U Afib clinic 02/13/18. Patient is s/p DCCV on 01/30/18 which lasted only about two days per the patient. He could feel his irregular pulse and his exercise tolerance is much less in afib. Of note, he reports that he may have missed a dose of Eliquis on 02/11/18 but he is sure he has not missed any others.  Today, he denies symptoms of palpitations, chest pain, shortness of breath, orthopnea, PND, edema, syncope. The patient is tolerating medications without difficulties.  Past Medical History:  Diagnosis Date  . Atrial flutter ([place]) 2007   s/p CTI ablation by Dr Lovena Le  . Back pain   . Hyperlipidemia   . Hypertension   . Insomnia   . NICM (nonischemic cardiomyopathy) ([place])    a. 2005 - tachy mediated. normalized after return of NSR.  [name] Obesity   . Paroxysmal atrial fibrillation  [facility])    Past Surgical History:  Procedure Laterality Date  . atrial flutter ablation  06/10/2005   CTI ablation by Dr Lovena Le for atrial flutter  . CARDIOVERSION N/A 07/20/2016   Procedure: CARDIOVERSION;  Surgeon: Minus Breeding, MD;  Location: [facility] ENDOSCOPY;  Service: Cardiovascular;  Laterality: N/A;  . CARDIOVERSION N/A 12/13/2017   Procedure: CARDIOVERSION;  Surgeon: Thayer Headings, MD;  Location: [facility] ENDOSCOPY;  Service: Cardiovascular;  Laterality: N/A;  . CARDIOVERSION N/A 01/30/2018   Procedure: CARDIOVERSION;  Surgeon: Fay Records, MD;  Location: [facility] ENDOSCOPY;  Service: Cardiovascular;  Laterality: N/A;  . NO PAST SURGERIES      Current Outpatient Medications  Medication Sig Dispense Refill  . acetaminophen (TYLENOL) 500 MG tablet Take 500 mg by mouth daily as needed (nerve pain).    [name] amitriptyline (ELAVIL) 50 MG tablet Take 50-75 mg by mouth at bedtime.     [name] atorvastatin (LIPITOR) 10 MG tablet Take 10 mg by mouth every evening.     . diltiazem (CARTIA XT) 180 MG 24 hr capsule Take 1 capsule (180 mg total) by mouth daily. (Patient taking differently: Take 180 mg by mouth every evening. ) 90 capsule 3  . ELIQUIS 5 MG TABS tablet TAKE 1 TABLET BY MOUTH TWICE DAILY 180 tablet 3  . famotidine (PEPCID) 20 MG tablet Take 20 mg by mouth daily as needed for heartburn or indigestion.    . flecainide (TAMBOCOR) 100 MG  tablet Take 1 tablet (100 mg total) by mouth 2 (two) times daily. 180 tablet 1  . Homeopathic Products (ZICAM COLD REMEDY PO) Take 1 lozenge by mouth as needed (immune support).    [name] ibuprofen (ADVIL,MOTRIN) 200 MG tablet Take 600 mg by mouth daily as needed (nerve pain).    . metoprolol succinate (TOPROL-XL) 25 MG 24 hr tablet Take 1 tablet (25 mg total) by mouth daily. 90 tablet 0  . Multiple Vitamin (MULTIVITAMIN WITH MINERALS) TABS tablet Take 1 tablet by mouth daily.    . Omega-3 Fatty Acids (FISH OIL) 1200 MG CAPS Take 1,200 mg by mouth 2 (two) times daily.     .  Simethicone (GAS-X PO) Take 2 tablets by mouth daily as needed (gas).    . traMADol (ULTRAM) 50 MG tablet Take 1 tablet (50 mg total) by mouth every 6 (six) hours as needed. (Patient taking differently: Take 50 mg by mouth every 6 (six) hours as needed (LEG PAIN). ) 20 tablet 0  . valACYclovir (VALTREX) 1000 MG tablet Take 2,000 mg by mouth every 12 (twelve) hours as needed (FOR COLD SORES). Max 2 doses    . vitamin C (ASCORBIC ACID) 500 MG tablet Take 500 mg by mouth daily as needed (immune support).    . vitamin E (VITAMIN E) 400 UNIT capsule Take 400 Units by mouth daily.    [name] zolpidem (AMBIEN) 10 MG tablet Take 10 mg by mouth at bedtime as needed for sleep.     No current facility-administered medications for this encounter.     Allergies  Allergen Reactions  . Penicillins Rash    DID THE REACTION INVOLVE: Swelling of the face/tongue/throat, SOB, or low BP? No Sudden or severe rash/hives, skin peeling, or the inside of the mouth or nose? Unknown Did it require medical treatment? Unknown When did it last happen?Childhood Allergy If all above answers are "NO", may proceed with cephalosporin use.      Social History   Socioeconomic History  . Marital status: Married    Spouse name: Not on file  . Number of children: Not on file  . Years of education: Not on file  . Highest education level: Not on file  Occupational History  . Not on file  Social Needs  . Financial resource strain: Not on file  . Food insecurity:    Worry: Not on file    Inability: Not on file  . Transportation needs:    Medical: Not on file    Non-medical: Not on file  Tobacco Use  . Smoking status: Former Research scientist (life sciences)  . Smokeless tobacco: Never Used  Substance and Sexual Activity  . Alcohol use: Yes    Comment: 3-4 drinks per day  . Drug use: No  . Sexual activity: Yes  Lifestyle  . Physical activity:    Days per week: Not on file    Minutes per session: Not on file  . Stress: Not on file    Relationships  . Social connections:    Talks on phone: Not on file    Gets together: Not on file    Attends religious service: Not on file    Active member of club or organization: Not on file    Attends meetings of clubs or organizations: Not on file    Relationship status: Not on file  . Intimate partner violence:    Fear of current or ex partner: Not on file    Emotionally abused: Not on file  Physically abused: Not on file    Forced sexual activity: Not on file  Other Topics Concern  . Not on file  Social History Narrative   Lives in [place]   Retired from [employer]    Family History  Problem Relation Age of Onset  . Heart attack Father   . Hypertension Father   . Hypertension Mother     ROS- All systems are reviewed and negative except as per the HPI above  Physical Exam: Vitals:   02/13/18 0933  BP: 112/72  Pulse: 79  Weight: 107 kg  Height: 6' (1.829 m)   Wt Readings from Last 3 Encounters:  02/13/18 107 kg  01/30/18 103 kg  01/11/18 108.6 kg    Labs: Lab Results  Component Value Date   NA 140 01/30/2018   K 4.4 01/30/2018   CL 103 12/02/2017   CO2 28 12/02/2017   GLUCOSE 114 (H) 01/30/2018   BUN 12 12/02/2017   CREATININE 1.03 12/02/2017   CALCIUM 9.0 12/02/2017   MG 2.4 (H) 11/22/2017   Lab Results  Component Value Date   INR 1.0 06/17/2016   Lab Results  Component Value Date   CHOL 176 08/09/2010   HDL 46 08/09/2010   LDLCALC 83 08/09/2010   TRIG 234 (H) 08/09/2010     GEN- The patient is well appearing, alert and oriented x 3 today.   HEENT-head normocephalic, atraumatic, sclera clear, conjunctiva pink, hearing intact, trachea midline. Lungs- Clear to ausculation bilaterally, normal work of breathing Heart- irregular rate and rhythm, no murmurs, rubs or gallops  GI- soft, NT, ND, + BS Extremities- no clubbing, cyanosis, or edema MS- no significant deformity or atrophy Skin- no rash or lesion Psych- euthymic mood, full  affect Neuro- strength and sensation are intact   EKG- atrial fibrillation HR 79, QRS 92, QTc 412  Epic records reviewed Echo Study Conclusions Study Conclusions  - Left ventricle: The cavity size was at the upper limits of   normal. Systolic function was normal. The estimated ejection   fraction was in the range of 55% to 60%. Wall motion was normal;   there were no regional wall motion abnormalities. The study was   not technically sufficient to allow evaluation of LV diastolic   dysfunction due to atrial fibrillation. - Aortic valve: Poorly visualized. Trileaflet; normal thickness,   mildly calcified leaflets. - Mitral valve: Calcified annulus. There was trivial regurgitation. - Left atrium: The atrium was mildly dilated. - Tricuspid valve: There was trivial regurgitati   Assessment and Plan: 1. Persistent afib  S/p cardioversion 06/2616 and 01/30/18 Continue eliquis 5 mg bid for chadsvasc score of 2. Possible missed dose 02/21/18. Continue flecainide 100 mg bid Continue Diltiazem 180 mg daily Continue Toprol 25 mg daily  Therapeutic strategies for afib including Tikosyn and ablation were discussed in detail with the patient today. Risk, benefits, and alternatives to EP study and radiofrequency ablation for afib were also discussed in detail today. These risks include but are not limited to stroke, bleeding, vascular damage, tamponade, perforation, damage to the esophagus, lungs, and other structures, pulmonary vein stenosis, worsening renal function, and death. The patient understands these risk and wishes to proceed.  Per Dr Jackalyn Lombard last note, we will go ahead and arrange this.  2. HTN Stable, no changes today  Follow up in Afib clinic and with Dr Rayann Heman post procedure.  [facility] [address] [place], [place] [address] [phone]

## 2018-02-13 NOTE — Therapy (Signed)
[facility] [address] [place], [place], [address] Phone: [phone]   Fax:  [phone]  Physical Therapy Treatment  Patient Details  Name: Gary [name] MRN: [id number] Date of Birth: 03/05/1950 Referring Provider (PT): Karlton Lemon, MD   Encounter Date: 02/13/2018  PT End of Session - 02/13/18 1204    Visit Number  18    Date for PT Re-Evaluation  03/06/18    PT Start Time  1149    PT Stop Time  1218   traction only today   PT Time Calculation (min)  29 min    Activity Tolerance  Patient tolerated treatment well    Behavior During Therapy  [facility] for tasks assessed/performed       Past Medical History:  Diagnosis Date  . Atrial flutter ([place]) 2007   s/p CTI ablation by Dr Lovena Le  . Back pain   . Hyperlipidemia   . Hypertension   . Insomnia   . NICM (nonischemic cardiomyopathy) ([place])    a. 2005 - tachy mediated. normalized after return of NSR.  [name] Obesity   . Paroxysmal atrial fibrillation [facility])     Past Surgical History:  Procedure Laterality Date  . atrial flutter ablation  06/10/2005   CTI ablation by Dr Lovena Le for atrial flutter  . CARDIOVERSION N/A 07/20/2016   Procedure: CARDIOVERSION;  Surgeon: Minus Breeding, MD;  Location: [facility] ENDOSCOPY;  Service: Cardiovascular;  Laterality: N/A;  . CARDIOVERSION N/A 12/13/2017   Procedure: CARDIOVERSION;  Surgeon: Thayer Headings, MD;  Location: [facility] ENDOSCOPY;  Service: Cardiovascular;  Laterality: N/A;  . CARDIOVERSION N/A 01/30/2018   Procedure: CARDIOVERSION;  Surgeon: Fay Records, MD;  Location: [place];  Service: Cardiovascular;  Laterality: N/A;  . NO PAST SURGERIES      There were no vitals filed for this visit.  Subjective Assessment - 02/13/18 1151    Subjective  Things have been busy with caring for my aunt.      Currently in Pain?  No/denies   pain varies and is related to standing and walking                       [facility] Adult PT Treatment/Exercise - 02/13/18 0001      Lumbar Exercises: Aerobic   Nustep  L5 x 5 min   PT present to discuss progress      Traction   Type of Traction  Lumbar    Min (lbs)  85    Rest Time  --   static   Time  10               PT Short Term Goals - 01/23/18 1202      PT SHORT TERM GOAL #1   Title  be independent in initial HEP    Status  Achieved      PT SHORT TERM GOAL #2   Title  report a 25% reduction in Lt LE pain with standing and walking     Status  Achieved        PT Long Term Goals - 02/08/18 1157      PT LONG TERM GOAL #2   Title  report a 75% reduction in Lt LE pain with standing and walking    Baseline  50-60%     Time  6    Period  Weeks    Status  On-going      PT LONG TERM GOAL #3  Title  verbalize and demonstrate body mechanics modifications for lumbar protection with daily tasks    Status  Achieved      PT LONG TERM GOAL #4   Title  reduce Lt LE pain to walk for 30 minutes without limitation            Plan - 02/13/18 1202    Clinical Impression Statement  Pt is coming 1x/wk for traction and manual therapy for dry needling.  Pt has been consistent and compliant with HEP for flexibility and strength.  Pt is not able to determine if dry needling is helping so PT decided to hold on this today.  Pt increased to 90# static and tolerated this well.  Pt reports intermittent symptoms and has greater tolerance for standing and walking.  Pt had had difficulty with exercise compliance due to helping to care for his aunt right now.  Pt will continue to benefit from skilled PT for traction to address radiculopathy.      Rehab Potential  Good    PT Frequency  2x / week    PT Duration  6 weeks    PT Treatment/Interventions  ADLs/Self Care Home Management;Cryotherapy;Electrical Stimulation;Traction;Moist Heat;Functional mobility training;Therapeutic activities;Therapeutic exercise;Patient/family  education;Neuromuscular re-education;Manual techniques;Passive range of motion;Taping;Dry needling    PT Next Visit Plan  1x/wk for traction and/or dry needling, HEP advancement and traction.     PT Home Exercise Plan  Access Code: [email or web address]     Consulted and Agree with Plan of Care  Patient       Patient will benefit from skilled therapeutic intervention in order to improve the following deficits and impairments:  Pain, Improper body mechanics, Impaired flexibility, Decreased activity tolerance, Decreased range of motion, Decreased strength, Postural dysfunction  Visit Diagnosis: Pain in left hip  Chronic left-sided low back pain with left-sided sciatica  Stiffness of left hip, not elsewhere classified  Stiffness of right hip, not elsewhere classified  Muscle weakness (generalized)     Problem List Patient Active Problem List   Diagnosis Date Noted  . 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 Left hip pain 09/13/2016  . Family history of cardiovascular disease 05/21/2015  . Obesity   . Hyperlipidemia   . A-fib ([place])   . Atrial flutter ([place])   . Insomnia   . Heart disease   . Back pain   . Cardiomyopathy [facility])     Sigurd Sos, PT 02/13/18 12:09 PM  [facility] [address], [place], [place], [address] Phone: [phone]   Fax:  [phone]  Name: Gary [name] MRN: [id number] Date of Birth: December 03, 1950

## 2018-02-16 ENCOUNTER — Ambulatory Visit: Payer: PPO | Admitting: Interventional Cardiology

## 2018-02-20 ENCOUNTER — Ambulatory Visit: Payer: PPO

## 2018-02-20 DIAGNOSIS — M5442 Lumbago with sciatica, left side: Secondary | ICD-10-CM

## 2018-02-20 DIAGNOSIS — M6281 Muscle weakness (generalized): Secondary | ICD-10-CM

## 2018-02-20 DIAGNOSIS — M25652 Stiffness of left hip, not elsewhere classified: Secondary | ICD-10-CM

## 2018-02-20 DIAGNOSIS — M25552 Pain in left hip: Secondary | ICD-10-CM

## 2018-02-20 DIAGNOSIS — M25651 Stiffness of right hip, not elsewhere classified: Secondary | ICD-10-CM

## 2018-02-20 DIAGNOSIS — G8929 Other chronic pain: Secondary | ICD-10-CM

## 2018-02-20 NOTE — Therapy (Signed)
[facility] [address] [place], [place], [address] Phone: [phone]   Fax:  [phone]  Physical Therapy Treatment  Patient Details  Name: Gary [name] MRN: [id number] Date of Birth: 29-Dec-1950 Referring Provider (PT): Karlton Lemon, MD   Encounter Date: 02/20/2018  PT End of Session - 02/20/18 1216    Visit Number  19    Date for PT Re-Evaluation  03/06/18    Authorization Type  Medicare    PT Start Time  1146    PT Stop Time  1229    PT Time Calculation (min)  43 min    Activity Tolerance  Patient tolerated treatment well    Behavior During Therapy  [facility] for tasks assessed/performed       Past Medical History:  Diagnosis Date  . Atrial flutter ([place]) 2007   s/p CTI ablation by Dr Lovena Le  . Back pain   . Hyperlipidemia   . Hypertension   . Insomnia   . NICM (nonischemic cardiomyopathy) ([place])    a. 2005 - tachy mediated. normalized after return of NSR.  [name] Obesity   . Paroxysmal atrial fibrillation [facility])     Past Surgical History:  Procedure Laterality Date  . atrial flutter ablation  06/10/2005   CTI ablation by Dr Lovena Le for atrial flutter  . CARDIOVERSION N/A 07/20/2016   Procedure: CARDIOVERSION;  Surgeon: Minus Breeding, MD;  Location: [facility] ENDOSCOPY;  Service: Cardiovascular;  Laterality: N/A;  . CARDIOVERSION N/A 12/13/2017   Procedure: CARDIOVERSION;  Surgeon: Thayer Headings, MD;  Location: [facility] ENDOSCOPY;  Service: Cardiovascular;  Laterality: N/A;  . CARDIOVERSION N/A 01/30/2018   Procedure: CARDIOVERSION;  Surgeon: Fay Records, MD;  Location: [place];  Service: Cardiovascular;  Laterality: N/A;  . NO PAST SURGERIES      There were no vitals filed for this visit.  Subjective Assessment - 02/20/18 1144    Subjective  I took the day off yesterday.  Last week I did a lot of good exercise.  I was having more muscular hip pain.    Patient Stated Goals  reduce Lt LE pain                        [facility] Adult PT Treatment/Exercise - 02/20/18 0001      Lumbar Exercises: Aerobic   Nustep  L5 x 10 min   PT present to discuss progress      Traction   Type of Traction  Lumbar    Min (lbs)  95    Rest Time  --   static   Time  10      Manual Therapy   Manual Therapy  Soft tissue mobilization    Manual therapy comments  trigger point release to Lt gluteals in Rt sidelying       Trigger Point Dry Needling - 02/20/18 1152    Consent Given?  Yes    Muscles Treated Lower Body  Gluteus minimus;Gluteus maximus;Piriformis   Lt only   Gluteus Maximus Response  Twitch response elicited;Palpable increased muscle length    Gluteus Minimus Response  Twitch response elicited;Palpable increased muscle length    Piriformis Response  Twitch response elicited;Palpable increased muscle length             PT Short Term Goals - 01/23/18 1202      PT SHORT TERM GOAL #1   Title  be independent in initial HEP    Status  Achieved      PT SHORT TERM GOAL #2   Title  report a 25% reduction in Lt LE pain with standing and walking     Status  Achieved        PT Long Term Goals - 02/20/18 1150      PT LONG TERM GOAL #2   Title  report a 75% reduction in Lt LE pain with standing and walking    Baseline  50-60%     Time  6    Period  Weeks    Status  On-going      PT LONG TERM GOAL #3   Title  verbalize and demonstrate body mechanics modifications for lumbar protection with daily tasks    Status  Achieved      PT LONG TERM GOAL #4   Title  reduce Lt LE pain to walk for 30 minutes without limitation    Baseline  in grocery store x 30-40 minutes, has not tried to do neighborhood walking    Time  8    Period  Weeks    Status  On-going            Plan - 02/20/18 1149    Clinical Impression Statement   Pt is coming 1-2x/wk for traction and manual therapy for dry needling.  Pt has been consistent and compliant with HEP for flexibility and  strength.  Pt had increased Lt hip musculature pain over the weekend and he had trigger points in the Lt gluteals and demonstrated improved tissue mobility and reduced trigger points after dry needling today.   Pt increased to 95# static today and tolerated this well.  Pt reports intermittent symptoms and has greater tolerance for standing and walking.  Pt had had difficulty with exercise compliance due to helping to care for his aunt right now.  Pt will continue to benefit from skilled PT for traction to address radiculopathy.       Rehab Potential  Good    PT Frequency  2x / week    PT Duration  6 weeks    PT Treatment/Interventions  ADLs/Self Care Home Management;Cryotherapy;Electrical Stimulation;Traction;Moist Heat;Functional mobility training;Therapeutic activities;Therapeutic exercise;Patient/family education;Neuromuscular re-education;Manual techniques;Passive range of motion;Taping;Dry needling    PT Next Visit Plan  dry needling, traction, advance HEP as needed    PT Home Exercise Plan  Access Code: [email or web address]     Consulted and Agree with Plan of Care  Patient       Patient will benefit from skilled therapeutic intervention in order to improve the following deficits and impairments:  Pain, Improper body mechanics, Impaired flexibility, Decreased activity tolerance, Decreased range of motion, Decreased strength, Postural dysfunction  Visit Diagnosis: Pain in left hip  Chronic left-sided low back pain with left-sided sciatica  Stiffness of left hip, not elsewhere classified  Stiffness of right hip, not elsewhere classified  Muscle weakness (generalized)     Problem List Patient Active Problem List   Diagnosis Date Noted  . 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 Left hip pain 09/13/2016  . Family history of cardiovascular disease 05/21/2015  . Obesity   . Hyperlipidemia   . A-fib ([place])   . Atrial flutter ([place])   . Insomnia   . Heart disease   . Back pain   .  Cardiomyopathy [facility])     Sigurd Sos, PT 02/20/18 12:20 PM  [place] [address], [place], [place], [address] Phone: [phone]   Fax:  [phone]  Name: Gary [name] MRN: [id number] Date of Birth: 05/02/1950

## 2018-02-22 ENCOUNTER — Telehealth: Payer: Self-pay | Admitting: *Deleted

## 2018-02-22 ENCOUNTER — Encounter (HOSPITAL_COMMUNITY): Payer: Self-pay | Admitting: *Deleted

## 2018-02-22 ENCOUNTER — Other Ambulatory Visit (HOSPITAL_COMMUNITY): Payer: Self-pay | Admitting: *Deleted

## 2018-02-22 DIAGNOSIS — I4819 Other persistent atrial fibrillation: Secondary | ICD-10-CM

## 2018-02-22 NOTE — Telephone Encounter (Signed)
Follow Up:     Pt returning your call, to schedule his Ablation.

## 2018-02-22 NOTE — Telephone Encounter (Signed)
Patient scheduled for afib ablation 2/26.

## 2018-02-27 ENCOUNTER — Ambulatory Visit: Payer: PPO | Attending: Family Medicine

## 2018-02-27 DIAGNOSIS — G8929 Other chronic pain: Secondary | ICD-10-CM | POA: Insufficient documentation

## 2018-02-27 DIAGNOSIS — M25652 Stiffness of left hip, not elsewhere classified: Secondary | ICD-10-CM | POA: Insufficient documentation

## 2018-02-27 DIAGNOSIS — M6281 Muscle weakness (generalized): Secondary | ICD-10-CM | POA: Diagnosis not present

## 2018-02-27 DIAGNOSIS — M5442 Lumbago with sciatica, left side: Secondary | ICD-10-CM | POA: Diagnosis not present

## 2018-02-27 DIAGNOSIS — M25552 Pain in left hip: Secondary | ICD-10-CM | POA: Diagnosis not present

## 2018-02-27 NOTE — Therapy (Signed)
[facility] [address] [place], [place], [address] Phone: [phone]   Fax:  [phone]  Physical Therapy Treatment  Patient Details  Name: Gary [name] MRN: [id number] Date of Birth: 1950/03/23 Referring Provider (PT): Karlton Lemon, MD   Encounter Date: 02/27/2018  PT End of Session - 02/27/18 1204    Visit Number  20    PT Start Time  3300    PT Stop Time  1218    PT Time Calculation (min)  32 min    Activity Tolerance  Patient tolerated treatment well    Behavior During Therapy  [facility] for tasks assessed/performed       Past Medical History:  Diagnosis Date  . Atrial flutter ([place]) 2007   s/p CTI ablation by Dr Lovena Le  . Back pain   . Hyperlipidemia   . Hypertension   . Insomnia   . NICM (nonischemic cardiomyopathy) ([place])    a. 2005 - tachy mediated. normalized after return of NSR.  [name] Obesity   . Paroxysmal atrial fibrillation [facility])     Past Surgical History:  Procedure Laterality Date  . atrial flutter ablation  06/10/2005   CTI ablation by Dr Lovena Le for atrial flutter  . CARDIOVERSION N/A 07/20/2016   Procedure: CARDIOVERSION;  Surgeon: Minus Breeding, MD;  Location: [facility] ENDOSCOPY;  Service: Cardiovascular;  Laterality: N/A;  . CARDIOVERSION N/A 12/13/2017   Procedure: CARDIOVERSION;  Surgeon: Thayer Headings, MD;  Location: [facility] ENDOSCOPY;  Service: Cardiovascular;  Laterality: N/A;  . CARDIOVERSION N/A 01/30/2018   Procedure: CARDIOVERSION;  Surgeon: Fay Records, MD;  Location: [place];  Service: Cardiovascular;  Laterality: N/A;  . NO PAST SURGERIES      There were no vitals filed for this visit.  Subjective Assessment - 02/27/18 1145    Subjective  I am going to make today my last day.  I have my exercises and I will continue with them.      Diagnostic tests  MRI of lumbar spine: facet degeneration and stenosis L5-S1, scoliosis    Patient Stated Goals  reduce Lt LE pain    Currently  in Pain?  No/denies   pain yesterday due to stress        [facility] PT Assessment - 02/27/18 0001      Assessment   Medical Diagnosis  Lt hip pain    Onset Date/Surgical Date  03/08/17      Observation/Other Assessments   Focus on Therapeutic Outcomes (FOTO)   55% limitation                   [facility] Adult PT Treatment/Exercise - 02/27/18 0001      Lumbar Exercises: Aerobic   Nustep  L5 x 10 min   PT present to discuss progress      Traction   Type of Traction  Lumbar    Min (lbs)  95    Rest Time  --   static   Time  10               PT Short Term Goals - 01/23/18 1202      PT SHORT TERM GOAL #1   Title  be independent in initial HEP    Status  Achieved      PT SHORT TERM GOAL #2   Title  report a 25% reduction in Lt LE pain with standing and walking     Status  Achieved  PT Long Term Goals - 02/27/18 1147      PT LONG TERM GOAL #1   Title  be independent in advanced HEP    Status  Achieved      PT LONG TERM GOAL #2   Title  report a 75% reduction in Lt LE pain with standing and walking    Baseline  60% limitation    Status  Partially Met      PT LONG TERM GOAL #3   Title  verbalize and demonstrate body mechanics modifications for lumbar protection with daily tasks    Status  Achieved      PT LONG TERM GOAL #4   Title  reduce Lt LE pain to walk for 30 minutes without limitation    Baseline  in grocery store x 30-40 minutes, has not tried to do neighborhood walking    Status  Partially Met      PT LONG TERM GOAL #5   Title  reduce FOTO to < or = to 35% limitation    Baseline  38%-55%    Status  Partially Met            Plan - 02/27/18 1213    Clinical Impression Statement  Pt reports 60% overall improvement in symptoms since the start of care.  Pt continues to have Lt LE pain that limits ability to stand and walk.  Pt continues to exercise to strengthen his core and Lt hip and walk as much as able.  Pt will D/C to HEP  today and will continue with HEP and follow-up with MD as needed.      PT Next Visit Plan  D/C PT    PT Home Exercise Plan  Access Code: [email or web address]     Consulted and Agree with Plan of Care  Patient       Patient will benefit from skilled therapeutic intervention in order to improve the following deficits and impairments:     Visit Diagnosis: Pain in left hip  Chronic left-sided low back pain with left-sided sciatica  Stiffness of left hip, not elsewhere classified  Muscle weakness (generalized)     Problem List Patient Active Problem List   Diagnosis Date Noted  . 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 Left hip pain 09/13/2016  . Family history of cardiovascular disease 05/21/2015  . Obesity   . Hyperlipidemia   . A-fib ([place])   . Atrial flutter ([place])   . Insomnia   . Heart disease   . Back pain   . Cardiomyopathy ([place])    PHYSICAL THERAPY DISCHARGE SUMMARY  Visits from Start of Care: 20  Current functional level related to goals / functional outcomes: Pt reports 60% overall improvement in symptoms since the start of care.     Remaining deficits: Pt is limited in standing and walking due to Lt LE pain.     Education / Equipment: HEP, posture/body mechanics Plan: Patient agrees to discharge.  Patient goals were partially met. Patient is being discharged due to being pleased with the current functional level.  ?????       Sigurd Sos, PT 02/27/18 12:14 PM  [place] [address] [place], [place], [address] Phone: [phone]   Fax:  [phone]  Name: Gary [name] MRN: [id number] Date of Birth: 03/05/1950

## 2018-03-06 ENCOUNTER — Ambulatory Visit: Payer: PPO

## 2018-03-06 DIAGNOSIS — M4800 Spinal stenosis, site unspecified: Secondary | ICD-10-CM | POA: Diagnosis not present

## 2018-03-13 ENCOUNTER — Ambulatory Visit (HOSPITAL_COMMUNITY)
Admission: RE | Admit: 2018-03-13 | Discharge: 2018-03-13 | Disposition: A | Discharge status: Home / Self Care | Payer: PPO | Source: Ambulatory Visit | Attending: Nurse Practitioner | Admitting: Nurse Practitioner

## 2018-03-13 DIAGNOSIS — I4819 Other persistent atrial fibrillation: Secondary | ICD-10-CM | POA: Insufficient documentation

## 2018-03-13 LAB — CBC WITH DIFFERENTIAL/PLATELET
Abs Immature Granulocytes: 0.01 10*3/uL (ref 0.00–0.07)
Basophils Absolute: 0 10*3/uL (ref 0.0–0.1)
Basophils Relative: 1 %
Eosinophils Absolute: 0.1 10*3/uL (ref 0.0–0.5)
Eosinophils Relative: 2 %
HCT: 47.3 % (ref 39.0–52.0)
Hemoglobin: 15.6 g/dL (ref 13.0–17.0)
Immature Granulocytes: 0 %
LYMPHS ABS: 2.4 10*3/uL (ref 0.7–4.0)
Lymphocytes Relative: 46 %
MCH: 31.7 pg (ref 26.0–34.0)
MCHC: 33 g/dL (ref 30.0–36.0)
MCV: 96.1 fL (ref 80.0–100.0)
MONOS PCT: 9 %
Monocytes Absolute: 0.5 10*3/uL (ref 0.1–1.0)
NEUTROS PCT: 42 %
Neutro Abs: 2.2 10*3/uL (ref 1.7–7.7)
Platelets: 214 10*3/uL (ref 150–400)
RBC: 4.92 MIL/uL (ref 4.22–5.81)
RDW: 13.2 % (ref 11.5–15.5)
WBC: 5.2 10*3/uL (ref 4.0–10.5)
nRBC: 0 % (ref 0.0–0.2)

## 2018-03-13 LAB — BASIC METABOLIC PANEL
Anion gap: 7 (ref 5–15)
BUN: 10 mg/dL (ref 8–23)
CALCIUM: 9 mg/dL (ref 8.9–10.3)
CO2: 28 mmol/L (ref 22–32)
Chloride: 104 mmol/L (ref 98–111)
Creatinine, Ser: 1.07 mg/dL (ref 0.61–1.24)
GFR calc Af Amer: >60 mL/min (ref >60)
GFR calc non Af Amer: >60 mL/min (ref >60)
Glucose, Bld: 113 mg/dL — ABNORMAL HIGH (ref 70–99)
Potassium: 4.3 mmol/L (ref 3.5–5.1)
Sodium: 139 mmol/L (ref 135–145)

## 2018-03-14 ENCOUNTER — Telehealth (HOSPITAL_COMMUNITY): Payer: Self-pay | Admitting: Emergency Medicine

## 2018-03-14 DIAGNOSIS — M4800 Spinal stenosis, site unspecified: Secondary | ICD-10-CM | POA: Diagnosis not present

## 2018-03-14 NOTE — Telephone Encounter (Signed)
Left message on voicemail with name and callback number Sarahanne Novakowski RN Navigator Cardiac Imaging [facility] [phone] Office [phone] Cell  

## 2018-03-16 DIAGNOSIS — M4800 Spinal stenosis, site unspecified: Secondary | ICD-10-CM | POA: Diagnosis not present

## 2018-03-17 ENCOUNTER — Ambulatory Visit (HOSPITAL_COMMUNITY)
Admission: RE | Admit: 2018-03-17 | Discharge: 2018-03-17 | Disposition: A | Discharge status: Home / Self Care | Payer: PPO | Source: Ambulatory Visit | Attending: Internal Medicine | Admitting: Internal Medicine

## 2018-03-17 DIAGNOSIS — I4819 Other persistent atrial fibrillation: Secondary | ICD-10-CM

## 2018-03-17 MED ORDER — IOPAMIDOL (ISOVUE-370) INJECTION 76%
100.0000 mL | Freq: Once | INTRAVENOUS | Status: AC | PRN
  Administered 2018-03-17: 80 mL via INTRAVENOUS

## 2018-03-17 MED ORDER — NITROGLYCERIN 0.4 MG SL SUBL
SUBLINGUAL_TABLET | SUBLINGUAL | Status: AC
  Filled 2018-03-17: qty 2

## 2018-03-17 MED ORDER — NITROGLYCERIN 0.4 MG SL SUBL
0.8000 mg | SUBLINGUAL_TABLET | SUBLINGUAL | Status: DC | PRN
  Filled 2018-03-17: qty 25

## 2018-03-21 DIAGNOSIS — M4800 Spinal stenosis, site unspecified: Secondary | ICD-10-CM | POA: Diagnosis not present

## 2018-03-22 ENCOUNTER — Other Ambulatory Visit: Payer: Self-pay

## 2018-03-22 ENCOUNTER — Encounter (HOSPITAL_COMMUNITY)
Admission: RE | Disposition: A | Discharge status: Home / Self Care | Payer: Self-pay | Source: Home / Self Care | Attending: Internal Medicine

## 2018-03-22 ENCOUNTER — Encounter (HOSPITAL_COMMUNITY): Payer: Self-pay | Admitting: Certified Registered Nurse Anesthetist

## 2018-03-22 ENCOUNTER — Ambulatory Visit (HOSPITAL_COMMUNITY)
Admission: RE | Admit: 2018-03-22 | Discharge: 2018-03-22 | Disposition: A | Discharge status: Home / Self Care | Payer: PPO | Attending: Internal Medicine | Admitting: Internal Medicine

## 2018-03-22 ENCOUNTER — Ambulatory Visit (HOSPITAL_COMMUNITY): Payer: PPO | Admitting: Certified Registered Nurse Anesthetist

## 2018-03-22 DIAGNOSIS — Z6829 Body mass index (BMI) 29.0-29.9, adult: Secondary | ICD-10-CM | POA: Diagnosis not present

## 2018-03-22 DIAGNOSIS — I4819 Other persistent atrial fibrillation: Secondary | ICD-10-CM | POA: Diagnosis present

## 2018-03-22 DIAGNOSIS — E785 Hyperlipidemia, unspecified: Secondary | ICD-10-CM | POA: Diagnosis not present

## 2018-03-22 DIAGNOSIS — Z7901 Long term (current) use of anticoagulants: Secondary | ICD-10-CM | POA: Diagnosis not present

## 2018-03-22 DIAGNOSIS — I10 Essential (primary) hypertension: Secondary | ICD-10-CM | POA: Insufficient documentation

## 2018-03-22 DIAGNOSIS — G4700 Insomnia, unspecified: Secondary | ICD-10-CM | POA: Diagnosis not present

## 2018-03-22 DIAGNOSIS — I428 Other cardiomyopathies: Secondary | ICD-10-CM | POA: Insufficient documentation

## 2018-03-22 DIAGNOSIS — I4891 Unspecified atrial fibrillation: Secondary | ICD-10-CM | POA: Diagnosis not present

## 2018-03-22 DIAGNOSIS — E669 Obesity, unspecified: Secondary | ICD-10-CM | POA: Insufficient documentation

## 2018-03-22 DIAGNOSIS — Z880 Allergy status to penicillin: Secondary | ICD-10-CM | POA: Diagnosis not present

## 2018-03-22 DIAGNOSIS — Z79899 Other long term (current) drug therapy: Secondary | ICD-10-CM | POA: Diagnosis not present

## 2018-03-22 HISTORY — PX: ATRIAL FIBRILLATION ABLATION: EP1191

## 2018-03-22 LAB — POCT ACTIVATED CLOTTING TIME
ACTIVATED CLOTTING TIME: 257 s
Activated Clotting Time: 285 seconds
Activated Clotting Time: 312 seconds
Activated Clotting Time: 340 seconds
Activated Clotting Time: 197 seconds
Activated Clotting Time: 175 seconds

## 2018-03-22 SURGERY — ATRIAL FIBRILLATION ABLATION
Anesthesia: General

## 2018-03-22 MED ORDER — BUPIVACAINE HCL (PF) 0.25 % IJ SOLN
INTRAMUSCULAR | Status: AC
  Filled 2018-03-22: qty 60

## 2018-03-22 MED ORDER — HEPARIN SODIUM (PORCINE) 1000 UNIT/ML IJ SOLN
INTRAMUSCULAR | Status: DC | PRN
  Administered 2018-03-22 (×2): 1000 [IU] via INTRAVENOUS
  Administered 2018-03-22: 12000 [IU] via INTRAVENOUS

## 2018-03-22 MED ORDER — LIDOCAINE 2% (20 MG/ML) 5 ML SYRINGE
INTRAMUSCULAR | Status: DC | PRN
  Administered 2018-03-22: 100 mg via INTRAVENOUS

## 2018-03-22 MED ORDER — LACTATED RINGERS IV SOLN
INTRAVENOUS | Status: DC | PRN
  Administered 2018-03-22: 11:00:00 via INTRAVENOUS

## 2018-03-22 MED ORDER — HYDROCODONE-ACETAMINOPHEN 5-325 MG PO TABS
1.0000 | ORAL_TABLET | ORAL | Status: DC | PRN
  Administered 2018-03-22 (×2): 2 via ORAL

## 2018-03-22 MED ORDER — PROTAMINE SULFATE 10 MG/ML IV SOLN
INTRAVENOUS | Status: DC | PRN
  Administered 2018-03-22 (×4): 10 mg via INTRAVENOUS

## 2018-03-22 MED ORDER — TRAMADOL HCL 50 MG PO TABS: 50.0000 mg | ORAL_TABLET | Freq: Four times a day (QID) | ORAL | Status: DC | PRN

## 2018-03-22 MED ORDER — SODIUM CHLORIDE 0.9 % IV SOLN
INTRAVENOUS | Status: DC | PRN
  Administered 2018-03-22: 40 ug/min via INTRAVENOUS

## 2018-03-22 MED ORDER — PROPOFOL 10 MG/ML IV BOLUS
INTRAVENOUS | Status: DC | PRN
  Administered 2018-03-22: 130 mg via INTRAVENOUS

## 2018-03-22 MED ORDER — SUGAMMADEX SODIUM 200 MG/2ML IV SOLN
INTRAVENOUS | Status: DC | PRN
  Administered 2018-03-22: 200 mg via INTRAVENOUS

## 2018-03-22 MED ORDER — ROCURONIUM BROMIDE 10 MG/ML (PF) SYRINGE
PREFILLED_SYRINGE | INTRAVENOUS | Status: DC | PRN
  Administered 2018-03-22: 100 mg via INTRAVENOUS
  Administered 2018-03-22: 10 mg via INTRAVENOUS
  Administered 2018-03-22: 20 mg via INTRAVENOUS
  Administered 2018-03-22: 10 mg via INTRAVENOUS

## 2018-03-22 MED ORDER — APIXABAN 5 MG PO TABS
5.0000 mg | ORAL_TABLET | Freq: Once | ORAL | Status: AC
  Administered 2018-03-22: 5 mg via ORAL
  Filled 2018-03-22: qty 1

## 2018-03-22 MED ORDER — ONDANSETRON HCL 4 MG/2ML IJ SOLN
INTRAMUSCULAR | Status: DC | PRN
  Administered 2018-03-22: 4 mg via INTRAVENOUS

## 2018-03-22 MED ORDER — AMITRIPTYLINE HCL 25 MG PO TABS
50.0000 mg | ORAL_TABLET | Freq: Every day | ORAL | Status: DC
  Filled 2018-03-22: qty 3

## 2018-03-22 MED ORDER — SODIUM CHLORIDE 0.9% FLUSH: 3.0000 mL | Freq: Two times a day (BID) | INTRAVENOUS | Status: DC

## 2018-03-22 MED ORDER — BUPIVACAINE HCL (PF) 0.25 % IJ SOLN
INTRAMUSCULAR | Status: DC | PRN
  Administered 2018-03-22: 20 mL

## 2018-03-22 MED ORDER — ZOLPIDEM TARTRATE 5 MG PO TABS: 10.0000 mg | ORAL_TABLET | Freq: Every evening | ORAL | Status: DC | PRN

## 2018-03-22 MED ORDER — HEPARIN SODIUM (PORCINE) 1000 UNIT/ML IJ SOLN
INTRAMUSCULAR | Status: AC
  Filled 2018-03-22: qty 2

## 2018-03-22 MED ORDER — SODIUM CHLORIDE 0.9% FLUSH: 3.0000 mL | INTRAVENOUS | Status: DC | PRN

## 2018-03-22 MED ORDER — HYDROCODONE-ACETAMINOPHEN 5-325 MG PO TABS
ORAL_TABLET | ORAL | Status: AC
  Filled 2018-03-22: qty 2

## 2018-03-22 MED ORDER — ONDANSETRON HCL 4 MG/2ML IJ SOLN: 4.0000 mg | Freq: Four times a day (QID) | INTRAMUSCULAR | Status: DC | PRN

## 2018-03-22 MED ORDER — ACETAMINOPHEN 325 MG PO TABS: 650.0000 mg | ORAL_TABLET | ORAL | Status: DC | PRN

## 2018-03-22 MED ORDER — FENTANYL CITRATE (PF) 100 MCG/2ML IJ SOLN
INTRAMUSCULAR | Status: DC | PRN
  Administered 2018-03-22: 100 ug via INTRAVENOUS

## 2018-03-22 MED ORDER — HEPARIN (PORCINE) IN NACL 1000-0.9 UT/500ML-% IV SOLN
INTRAVENOUS | Status: DC | PRN
  Administered 2018-03-22: 500 mL

## 2018-03-22 MED ORDER — SODIUM CHLORIDE 0.9 % IV SOLN: 250.0000 mL | INTRAVENOUS | Status: DC | PRN

## 2018-03-22 MED ORDER — PANTOPRAZOLE SODIUM 40 MG PO TBEC: 40.0000 mg | DELAYED_RELEASE_TABLET | Freq: Every day | ORAL | 0 refills | Status: DC

## 2018-03-22 MED ORDER — PHENYLEPHRINE 40 MCG/ML (10ML) SYRINGE FOR IV PUSH (FOR BLOOD PRESSURE SUPPORT)
PREFILLED_SYRINGE | INTRAVENOUS | Status: DC | PRN
  Administered 2018-03-22: 80 ug via INTRAVENOUS

## 2018-03-22 MED ORDER — HEPARIN SODIUM (PORCINE) 1000 UNIT/ML IJ SOLN
INTRAMUSCULAR | Status: DC | PRN
  Administered 2018-03-22 (×2): 5000 [IU] via INTRAVENOUS
  Administered 2018-03-22: 4000 [IU] via INTRAVENOUS
  Administered 2018-03-22: 2000 [IU] via INTRAVENOUS

## 2018-03-22 MED ORDER — APIXABAN 5 MG PO TABS: 5.0000 mg | ORAL_TABLET | Freq: Two times a day (BID) | ORAL | Status: DC

## 2018-03-22 SURGICAL SUPPLY — 17 items
BLANKET WARM UNDERBOD FULL ACC (MISCELLANEOUS) ×2 IMPLANT
CATH MAPPNG PENTARAY F 2-6-2MM (CATHETERS) ×1 IMPLANT
CATH NAVISTAR SMARTTOUCH DF (ABLATOR) ×2 IMPLANT
CATH SOUNDSTAR 3D IMAGING (CATHETERS) ×2 IMPLANT
CATH WEBSTER BI DIR CS D-F CRV (CATHETERS) ×2 IMPLANT
COVER SWIFTLINK CONNECTOR (BAG) ×2 IMPLANT
NEEDLE BAYLIS TRANSSEPTAL 71CM (NEEDLE) ×2 IMPLANT
PACK EP LATEX FREE (CUSTOM PROCEDURE TRAY) ×1
PACK EP LF (CUSTOM PROCEDURE TRAY) ×1 IMPLANT
PAD PRO RADIOLUCENT 2001M-C (PAD) ×2 IMPLANT
PATCH CARTO3 (PAD) ×2 IMPLANT
PENTARAY F 2-6-2MM (CATHETERS) ×2
SHEATH AVANTI 11F 11CM (SHEATH) ×2 IMPLANT
SHEATH PINNACLE 7F 10CM (SHEATH) ×4 IMPLANT
SHEATH PINNACLE 9F 10CM (SHEATH) ×2 IMPLANT
SHEATH SWARTZ TS SL2 63CM 8.5F (SHEATH) ×2 IMPLANT
TUBING SMART ABLATE COOLFLOW (TUBING) ×2 IMPLANT

## 2018-03-22 NOTE — Anesthesia Procedure Notes (Signed)
Procedure Name: Intubation Date/Time: 03/22/2018 11:50 AM Performed by: Carney Living, CRNA Pre-anesthesia Checklist: Patient identified, Emergency Drugs available, Suction available, Patient being monitored and Timeout performed Patient Re-evaluated:Patient Re-evaluated prior to induction Oxygen Delivery Method: Circle system utilized Preoxygenation: Pre-oxygenation with 100% oxygen Induction Type: IV induction Ventilation: Mask ventilation without difficulty and Oral airway inserted - appropriate to patient size Laryngoscope Size: Mac and 4 Grade View: Grade II Tube type: Oral Tube size: 7.5 mm Number of attempts: 1 Airway Equipment and Method: Stylet Placement Confirmation: ETT inserted through vocal cords under direct vision,  positive ETCO2 and breath sounds checked- equal and bilateral Secured at: 23 cm Tube secured with: Tape Dental Injury: Teeth and Oropharynx as per pre-operative assessment

## 2018-03-22 NOTE — Discharge Summary (Signed)
ELECTROPHYSIOLOGY PROCEDURE DISCHARGE SUMMARY    Patient ID: Gary [name],  MRN: [id number], DOB/AGE: 1950/12/22 68 y.o.  Admit date: 03/22/2018 Discharge date: 03/22/2018  Primary Care Physician: [name], MD  Primary Cardiologist: Dr. Irish Lack Electrophysiologist: Thompson Grayer, MD  Primary Discharge Diagnosis:  1. Persistent AFib     CHA2DS2Vasc is 3, on Eliquis, appropriately dosed   Secondary Discharge Diagnosis:  1. NICM     Suspect tachy-mediated and recovered 2. HTN 3. Obesity  Procedures This Admission:  1.  Electrophysiology study and radiofrequency catheter ablation on 03/22/2018 by Dr Thompson Grayer.  This study demonstrated  CONCLUSIONS: 1. Atrial fibrillation upon presentation.   2. Intracardiac echo reveals a moderately enlarged left atrium with four separate pulmonary veins without evidence of pulmonary vein stenosis. 3. Successful electrical isolation and anatomical encircling of all four pulmonary veins with radiofrequency current.  A WACA approach was used 3. Additional left atrial ablation was performed with a standard box lesion created along the posterior wall of the left atrium 4. Atrial fibrillation successfully cardioverted to sinus rhythm. 5. No early apparent complications.   Brief HPI: Gary [name] is a 68 y.o. male with a history of persistent atrial fibrillation.  He failed medical therapy with Flecainide. Risks, benefits, and alternatives to catheter ablation of atrial fibrillation were reviewed with the patient who wished to proceed.  The patient underwent cardiac CT prior to the procedure which demonstrated no LAA thrombus.    Hospital Course:  The patient was admitted and underwent EPS/RFCA of atrial fibrillation with details as outlined above.  They were monitored on telemetry which demonstrated sinus rhythn.   R groin was without complication.  The patient  feels well, no CP or SOB, no site discomfort.   Wound care and restrictions  were reviewed with the patient by MD.  The patient will be seen back at the AFib clinic in 4 weeks and Dr Rayann Heman in 12 weeks for post ablation follow up.    Physical Exam: Vitals:   03/22/18 1610 03/22/18 1615 03/22/18 1656 03/22/18 2053  BP: 119/60 (!) 114/55  113/68  Pulse: 87 87  93  Resp: (!) 0 10  17  Temp:   98.1 F (36.7 C) 98.9 F (37.2 C)  TempSrc:   Temporal   SpO2: 99% 100%  94%  Weight:      Height:        GEN- The patient is well appearing, alert and oriented x 3 today.   HEENT: normocephalic, atraumatic; sclera clear, conjunctiva pink; hearing intact; oropharynx clear; neck supple  Lungs-  CTA b/l, normal work of breathing.  No wheezes, rales, rhonchi Heart-   RRR, no murmurs, rubs or gallops  GI- soft, non-tender, non-distended Extremities- no clubbing, cyanosis, or edema;  DP/PT 2+ bilaterally,  R groin without hematoma/bruit MS- no significant deformity or atrophy Skin- warm and dry, no rash or lesion Psych- euthymic mood, full affect Neuro- strength and sensation are intact   Labs:   Lab Results  Component Value Date   WBC 5.2 03/13/2018   HGB 15.6 03/13/2018   HCT 47.3 03/13/2018   MCV 96.1 03/13/2018   PLT 214 03/13/2018   No results for input(s): NA, K, CL, CO2, BUN, CREATININE, CALCIUM, PROT, BILITOT, ALKPHOS, ALT, AST, GLUCOSE in the last 168 hours.  Invalid input(s): LABALBU   Discharge Medications:  Allergies as of 03/22/2018      Reactions   Penicillins Rash   DID THE REACTION INVOLVE: Swelling  of the face/tongue/throat, SOB, or low BP? No Sudden or severe rash/hives, skin peeling, or the inside of the mouth or nose? Unknown Did it require medical treatment? Unknown When did it last happen?Childhood Allergy If all above answers are "NO", may proceed with cephalosporin use.      Medication List    TAKE these medications   acetaminophen 500 MG tablet Commonly known as:  TYLENOL Take 500 mg by mouth daily as needed (nerve  pain).   amitriptyline 50 MG tablet Commonly known as:  ELAVIL Take 50-75 mg by mouth at bedtime.   atorvastatin 10 MG tablet Commonly known as:  LIPITOR Take 10 mg by mouth every evening.   diltiazem 180 MG 24 hr capsule Commonly known as:  CARTIA XT Take 1 capsule (180 mg total) by mouth daily. What changed:  when to take this   ELIQUIS 5 MG Tabs tablet Generic drug:  apixaban TAKE 1 TABLET BY MOUTH TWICE DAILY   famotidine 20 MG tablet Commonly known as:  PEPCID Take 20 mg by mouth daily as needed for heartburn or indigestion.   Fish Oil 1200 MG Caps Take 1,200 mg by mouth 2 (two) times daily.   flecainide 100 MG tablet Commonly known as:  TAMBOCOR Take 1 tablet (100 mg total) by mouth 2 (two) times daily.   GAS-X PO Take 2 tablets by mouth daily as needed (gas).   ibuprofen 200 MG tablet Commonly known as:  ADVIL,MOTRIN Take 600 mg by mouth daily as needed (nerve pain).   metoprolol succinate 25 MG 24 hr tablet Commonly known as:  TOPROL-XL Take 1 tablet (25 mg total) by mouth daily.   multivitamin with minerals Tabs tablet Take 1 tablet by mouth daily.   pantoprazole 40 MG tablet Commonly known as:  PROTONIX Take 1 tablet (40 mg total) by mouth daily.   traMADol 50 MG tablet Commonly known as:  ULTRAM Take 1 tablet (50 mg total) by mouth every 6 (six) hours as needed. What changed:  reasons to take this   valACYclovir 1000 MG tablet Commonly known as:  VALTREX Take 2,000 mg by mouth every 12 (twelve) hours as needed (FOR COLD SORES). Max 2 doses   vitamin C 500 MG tablet Commonly known as:  ASCORBIC ACID Take 500 mg by mouth daily as needed (immune support).   vitamin E 400 UNIT capsule Generic drug:  vitamin E Take 400 Units by mouth daily.   ZICAM COLD REMEDY PO Take 1 lozenge by mouth as needed (immune support).   zolpidem 10 MG tablet Commonly known as:  AMBIEN Take 10 mg by mouth at bedtime as needed for sleep.       Disposition:   Home   Follow-up Information    [facility] Follow up.   Specialty:  Cardiology Why:  04/20/2018 @ 9:00AM Contact information: [address] [id number] [place] [address] [phone]       Thompson Grayer, MD Follow up.   Specialty:  Cardiology Why:  06/23/2018 @ 8:45AM Contact information: [place] [address] [phone]           SignedThompson Grayer MD 03/22/2018 9:11 PM

## 2018-03-22 NOTE — Progress Notes (Signed)
Site area: RFV x 3 Site Prior to Removal:  Level 0 Pressure Applied For:30 min Manual:   yes Patient Status During Pull:  stable Post Pull Site:  Level 0 Post Pull Instructions Given:  yes Post Pull Pulses Present: palpable Dressing Applied:  clear Bedrest begins @ 1640 [phone] Comments: georg/Debbie Young

## 2018-03-22 NOTE — Progress Notes (Signed)
Spoke with [name]. Confimed patient was given tonight's dose of eliquis. [name] advised ok to take pt off of bedrest at 2200. Pt ambulated to bathroom with no incident. Groin site is a level 0. Reviewed discharge instructions with pt and wife. Both verbalized understanding.

## 2018-03-22 NOTE — Transfer of Care (Signed)
Immediate Anesthesia Transfer of Care Note  Patient: Gary [name]  Procedure(s) Performed: ATRIAL FIBRILLATION ABLATION (N/A )  Patient Location: Cath Lab  Anesthesia Type:General  Level of Consciousness: awake, alert , oriented and patient cooperative  Airway & Oxygen Therapy: Patient Spontanous Breathing and Patient connected to nasal cannula oxygen  Post-op Assessment: Report given to RN, Post -op Vital signs reviewed and stable, Patient moving all extremities and Patient moving all extremities X 4  Post vital signs: Reviewed and stable  Last Vitals:  Vitals Value Taken Time  BP 104/53 03/22/2018  2:58 PM  Temp    Pulse 81 03/22/2018  3:00 PM  Resp 15 03/22/2018  3:00 PM  SpO2 98 % 03/22/2018  3:00 PM  Vitals shown include unvalidated device data.  Last Pain:  Vitals:   03/22/18 0944  TempSrc: Oral         Complications: No apparent anesthesia complications

## 2018-03-22 NOTE — Discharge Instructions (Signed)
Post procedure care instructions No driving for 4 days. No lifting over 5 lbs for 1 week. No vigorous or sexual activity for 1 week. You may return to work on 03/29/2018. Keep procedure site clean & dry. If you notice increased pain, swelling, bleeding or pus, call/return!  You may shower, but no soaking baths/hot tubs/pools for 1 week.

## 2018-03-22 NOTE — Progress Notes (Signed)
He has done well post ablation. No concerns  DC to home Routine wound care and follow-up.  Thompson Grayer MD, [facility] 03/22/2018 9:10 PM

## 2018-03-22 NOTE — Anesthesia Preprocedure Evaluation (Addendum)
Anesthesia Evaluation  Patient identified by MRN, date of birth, ID band Patient awake    Reviewed: Allergy & Precautions, NPO status , Patient's Chart, lab work & pertinent test results, reviewed documented beta blocker date and time   Airway Mallampati: I  TM Distance: >3 FB Neck ROM: Full    Dental  (+) Dental Advisory Given, Teeth Intact   Pulmonary former smoker,    Pulmonary exam normal        Cardiovascular hypertension, Pt. on medications and Pt. on home beta blockers Normal cardiovascular exam+ dysrhythmias Atrial Fibrillation      Neuro/Psych    GI/Hepatic   Endo/Other    Renal/GU      Musculoskeletal   Abdominal   Peds  Hematology   Anesthesia Other Findings   Reproductive/Obstetrics                            Anesthesia Physical Anesthesia Plan  ASA: III  Anesthesia Plan: General   Post-op Pain Management:    Induction: Intravenous  PONV Risk Score and Plan: 0 and Ondansetron and Treatment may vary due to age or medical condition  Airway Management Planned: Oral ETT  Additional Equipment:   Intra-op Plan:   Post-operative Plan:   Informed Consent: I have reviewed the patients History and Physical, chart, labs and discussed the procedure including the risks, benefits and alternatives for the proposed anesthesia with the patient or authorized representative who has indicated his/her understanding and acceptance.     Dental advisory given  Plan Discussed with: CRNA, Surgeon and Anesthesiologist  Anesthesia Plan Comments:        Anesthesia Quick Evaluation

## 2018-03-22 NOTE — H&P (Signed)
PCP: [name], MD Primary Cardiologist: Dr Irish Lack Primary EP: Dr Cena Benton is a 68 y.o. male who presents today for routine electrophysiology study and ablation for afib.  Since last being seen in our clinic, the patient reports doing reasonably well.  He went for over a year without afib.  Unfortunately, his afib returned in October after receiving steroids.  He was cardioverted but quickly returned to afib.  He remains in afib.  He has exertional SOB and fatigue but denies palpitations.  Today, he denies symptoms of  chest pain, shortness of breath,  lower extremity edema, dizziness, presyncope, or syncope.  The patient is otherwise without complaint today.       Past Medical History:  Diagnosis Date  . Atrial flutter ([place]) 2007   s/p CTI ablation by Dr Lovena Le  . Back pain   . Hyperlipidemia   . Hypertension   . Insomnia   . NICM (nonischemic cardiomyopathy) ([place])    a. 2005 - tachy mediated. normalized after return of NSR.  [name] Obesity   . Paroxysmal atrial fibrillation [facility])    Past Surgical History:  Procedure Laterality Date  . atrial flutter ablation  06/10/2005   CTI ablation by Dr Lovena Le for atrial flutter  . CARDIOVERSION N/A 07/20/2016   Procedure: CARDIOVERSION;  Surgeon: Minus Breeding, MD;  Location: [facility] ENDOSCOPY;  Service: Cardiovascular;  Laterality: N/A;  . CARDIOVERSION N/A 12/13/2017   Procedure: CARDIOVERSION;  Surgeon: Thayer Headings, MD;  Location: [facility] ENDOSCOPY;  Service: Cardiovascular;  Laterality: N/A;  . NO PAST SURGERIES      ROS- all systems are reviewed and negatives except as per HPI above        Current Outpatient Medications  Medication Sig Dispense Refill  . amitriptyline (ELAVIL) 50 MG tablet Take 50-75 mg by mouth at bedtime.     [name] atorvastatin (LIPITOR) 10 MG tablet Take 10 mg by mouth every evening.     . colchicine 0.6 MG tablet Take 0.6 mg by mouth daily as needed (FOR GOUT).     .  cyclobenzaprine (FLEXERIL) 10 MG tablet Take 10 mg by mouth daily as needed for muscle spasms.     [name] diltiazem (CARTIA XT) 180 MG 24 hr capsule Take 1 capsule (180 mg total) by mouth daily. (Patient taking differently: Take 180 mg by mouth every evening. ) 90 capsule 3  . ELIQUIS 5 MG TABS tablet TAKE 1 TABLET BY MOUTH TWICE DAILY 180 tablet 3  . flecainide (TAMBOCOR) 100 MG tablet Take 1 tablet (100 mg total) by mouth 2 (two) times daily. 180 tablet 1  . metoprolol succinate (TOPROL-XL) 25 MG 24 hr tablet Take 1 tablet (25 mg total) by mouth daily. 90 tablet 2  . Multiple Vitamin (MULTIVITAMIN WITH MINERALS) TABS tablet Take 1 tablet by mouth daily.    . Omega-3 Fatty Acids (FISH OIL) 1200 MG CAPS Take 1,200 mg by mouth 2 (two) times daily.     . traMADol (ULTRAM) 50 MG tablet Take 1 tablet (50 mg total) by mouth every 6 (six) hours as needed. (Patient taking differently: Take 50 mg by mouth every 6 (six) hours as needed (LEG PAIN). ) 20 tablet 0  . valACYclovir (VALTREX) 1000 MG tablet Take 1,000 mg by mouth daily as needed (FOR COLD SORES).     . vitamin E (VITAMIN E) 400 UNIT capsule Take 400 Units by mouth daily.  Current Facility-Administered Medications  Medication Dose Route Frequency Provider Last Rate Last Dose  . methylPREDNISolone acetate (DEPO-MEDROL) injection 40 mg  40 mg Intramuscular Once Thurman Coyer, DO        Physical Exam: Vitals:   03/22/18 0944  BP: 118/76  Pulse: 74  Resp: 18  Temp: 98.2 F (36.8 C)  SpO2: 99%    GEN- The patient is well appearing, alert and oriented x 3 today.   Head- normocephalic, atraumatic Eyes-  Sclera clear, conjunctiva pink Ears- hearing intact Oropharynx- clear Lungs- Clear to ausculation bilaterally, normal work of breathing Heart- irregular rate and rhythm, no murmurs, rubs or gallops, PMI not laterally displaced GI- soft, NT, ND, + BS Extremities- no clubbing, cyanosis, or edema   Assessment  and Plan:  1. Persistent afib The patient has symptomatic, recurrent persistent atrial fibrillation.  He is s/p CTI ablation previously he has failed medical therapy with flecainide. Risk, benefits, and alternatives to EP study and radiofrequency ablation for afib were also discussed in detail today. These risks include but are not limited to stroke, bleeding, vascular damage, tamponade, perforation, damage to the esophagus, lungs, and other structures, pulmonary vein stenosis, worsening renal function, and death. The patient understands these risk and wishes to proceed.   He reports compliance with eliquis without interruption.  Cardiac CT was reviewd at length with him today, including plans for FFR of the RCA  Thompson Grayer MD, [facility] 03/22/2018 11:17 AM

## 2018-03-23 ENCOUNTER — Encounter (HOSPITAL_COMMUNITY): Payer: Self-pay | Admitting: Internal Medicine

## 2018-03-23 ENCOUNTER — Other Ambulatory Visit (HOSPITAL_COMMUNITY): Payer: Self-pay | Admitting: Internal Medicine

## 2018-03-23 ENCOUNTER — Telehealth (HOSPITAL_COMMUNITY): Payer: Self-pay | Admitting: *Deleted

## 2018-03-23 NOTE — Anesthesia Postprocedure Evaluation (Signed)
Anesthesia Post Note  Patient: Gary [name]  Procedure(s) Performed: ATRIAL FIBRILLATION ABLATION (N/A )     Patient location during evaluation: PACU Anesthesia Type: General Level of consciousness: awake and alert Pain management: pain level controlled Vital Signs Assessment: post-procedure vital signs reviewed and stable Respiratory status: spontaneous breathing, nonlabored ventilation, respiratory function stable and patient connected to nasal cannula oxygen Cardiovascular status: blood pressure returned to baseline and stable Postop Assessment: no apparent nausea or vomiting Anesthetic complications: no    Last Vitals:  Vitals:   03/22/18 1656 03/22/18 2053  BP:  113/68  Pulse:  93  Resp:  17  Temp: 36.7 C 37.2 C  SpO2:  94%    Last Pain:  Vitals:   03/22/18 2000  TempSrc:   PainSc: 0-No pain                 Latoyia Tecson DAVID

## 2018-03-23 NOTE — Telephone Encounter (Signed)
Pt called in this morning stating he is running low grade fevers with cough + brown sputum. He noted the cough yesterday before ablation but did not have any sputum/fever at that time. He is going to call PCP to be seen/assessed. Also pt complained of burning at site of cardiac pads - instructed use of cool clothes to skin and hydrocortisone cream PRN. Pt verbalized understanding.

## 2018-03-25 DIAGNOSIS — R0602 Shortness of breath: Secondary | ICD-10-CM | POA: Diagnosis not present

## 2018-03-27 ENCOUNTER — Other Ambulatory Visit: Payer: Self-pay | Admitting: Interventional Cardiology

## 2018-03-31 DIAGNOSIS — J22 Unspecified acute lower respiratory infection: Secondary | ICD-10-CM | POA: Diagnosis not present

## 2018-04-03 ENCOUNTER — Other Ambulatory Visit: Payer: Self-pay

## 2018-04-03 ENCOUNTER — Ambulatory Visit (HOSPITAL_COMMUNITY)
Admission: RE | Admit: 2018-04-03 | Discharge: 2018-04-03 | Disposition: A | Discharge status: Home / Self Care | Payer: PPO | Source: Ambulatory Visit | Attending: Physician Assistant | Admitting: Physician Assistant

## 2018-04-03 ENCOUNTER — Encounter (HOSPITAL_COMMUNITY): Payer: Self-pay | Admitting: Physician Assistant

## 2018-04-03 VITALS — BP 110/62 | HR 81 | Ht 73.0 in | Wt 226.0 lb

## 2018-04-03 DIAGNOSIS — Z7901 Long term (current) use of anticoagulants: Secondary | ICD-10-CM | POA: Diagnosis not present

## 2018-04-03 DIAGNOSIS — Z6829 Body mass index (BMI) 29.0-29.9, adult: Secondary | ICD-10-CM | POA: Diagnosis not present

## 2018-04-03 DIAGNOSIS — I480 Paroxysmal atrial fibrillation: Secondary | ICD-10-CM | POA: Insufficient documentation

## 2018-04-03 DIAGNOSIS — E669 Obesity, unspecified: Secondary | ICD-10-CM | POA: Insufficient documentation

## 2018-04-03 DIAGNOSIS — E785 Hyperlipidemia, unspecified: Secondary | ICD-10-CM | POA: Insufficient documentation

## 2018-04-03 DIAGNOSIS — I10 Essential (primary) hypertension: Secondary | ICD-10-CM | POA: Insufficient documentation

## 2018-04-03 DIAGNOSIS — I4819 Other persistent atrial fibrillation: Secondary | ICD-10-CM | POA: Diagnosis not present

## 2018-04-03 DIAGNOSIS — Z9889 Other specified postprocedural states: Secondary | ICD-10-CM | POA: Insufficient documentation

## 2018-04-03 DIAGNOSIS — Z79899 Other long term (current) drug therapy: Secondary | ICD-10-CM | POA: Insufficient documentation

## 2018-04-03 DIAGNOSIS — Z8249 Family history of ischemic heart disease and other diseases of the circulatory system: Secondary | ICD-10-CM | POA: Insufficient documentation

## 2018-04-03 DIAGNOSIS — Z880 Allergy status to penicillin: Secondary | ICD-10-CM | POA: Insufficient documentation

## 2018-04-03 DIAGNOSIS — J069 Acute upper respiratory infection, unspecified: Secondary | ICD-10-CM | POA: Insufficient documentation

## 2018-04-03 DIAGNOSIS — Z87891 Personal history of nicotine dependence: Secondary | ICD-10-CM | POA: Insufficient documentation

## 2018-04-03 NOTE — Progress Notes (Signed)
Primary Care Physician: [name], MD Referring Physician: Dr. Monica Martinez is a 68 y.o. male with a h/o paroxysmal afib that had ablation 2/26. He had a cough prior to the procedure but the day afterward had brownish sputum and started running a fever. He went to PCP and was given a course of antibiotics for pneumonia. He was seen there Friday for f/u and given another 5 days of doxycycline. He is now not having fever but is still coughing. He feels that he has been out of rhythm  since the procedure. No swallowing or groin issues. He does feel shortness of breath. His weight is stable. Afib is rate controlled  Today, he denies symptoms of palpitations, chest pain, shortness of breath, orthopnea, PND, lower extremity edema, dizziness, presyncope, syncope, or neurologic sequela. The patient is tolerating medications without difficulties and is otherwise without complaint today.   Past Medical History:  Diagnosis Date  . Atrial flutter ([place]) 2007   s/p CTI ablation by Dr Lovena Le  . Back pain   . Hyperlipidemia   . Hypertension   . Insomnia   . NICM (nonischemic cardiomyopathy) ([place])    a. 2005 - tachy mediated. normalized after return of NSR.  [name] Obesity   . Paroxysmal atrial fibrillation [facility])    Past Surgical History:  Procedure Laterality Date  . ATRIAL FIBRILLATION ABLATION N/A 03/22/2018   Procedure: ATRIAL FIBRILLATION ABLATION;  Surgeon: Thompson Grayer, MD;  Location: [place] CV LAB;  Service: Cardiovascular;  Laterality: N/A;  . atrial flutter ablation  06/10/2005   CTI ablation by Dr Lovena Le for atrial flutter  . CARDIOVERSION N/A 07/20/2016   Procedure: CARDIOVERSION;  Surgeon: Minus Breeding, MD;  Location: [facility] ENDOSCOPY;  Service: Cardiovascular;  Laterality: N/A;  . CARDIOVERSION N/A 12/13/2017   Procedure: CARDIOVERSION;  Surgeon: Thayer Headings, MD;  Location: [facility] ENDOSCOPY;  Service: Cardiovascular;  Laterality: N/A;  . CARDIOVERSION N/A 01/30/2018   Procedure: CARDIOVERSION;  Surgeon: Fay Records, MD;  Location: [facility] ENDOSCOPY;  Service: Cardiovascular;  Laterality: N/A;  . NO PAST SURGERIES      Current Outpatient Medications  Medication Sig Dispense Refill  . acetaminophen (TYLENOL) 500 MG tablet Take 500 mg by mouth daily as needed (nerve pain).    [name] amitriptyline (ELAVIL) 50 MG tablet Take 50-75 mg by mouth at bedtime.     [name] atorvastatin (LIPITOR) 10 MG tablet Take 10 mg by mouth every evening.     . diltiazem (CARTIA XT) 180 MG 24 hr capsule Take 1 capsule (180 mg total) by mouth daily. (Patient taking differently: Take 180 mg by mouth every evening. ) 90 capsule 3  . doxycycline (VIBRA-TABS) 100 MG tablet Take 100 mg by mouth 2 (two) times daily.    [name] ELIQUIS 5 MG TABS tablet TAKE 1 TABLET BY MOUTH TWICE DAILY 180 tablet 3  . flecainide (TAMBOCOR) 100 MG tablet TAKE 1 TABLET(100 MG) BY MOUTH TWICE DAILY 180 tablet 0  . guaiFENesin-codeine (VIRTUSSIN A/C) 100-10 MG/5ML syrup Take 5 mLs by mouth 3 (three) times daily as needed for cough.    . Homeopathic Products (ZICAM COLD REMEDY PO) Take 1 lozenge by mouth as needed (immune support).    . metoprolol succinate (TOPROL-XL) 25 MG 24 hr tablet Take 1 tablet (25 mg total) by mouth daily. 90 tablet 0  . Multiple Vitamin (MULTIVITAMIN WITH MINERALS) TABS tablet Take 1 tablet by mouth daily.    . Omega-3 Fatty Acids (FISH OIL) 1200  MG CAPS Take 1,200 mg by mouth 2 (two) times daily.     . pantoprazole (PROTONIX) 40 MG tablet TAKE 1 TABLET BY MOUTH EVERY DAY 90 tablet 1  . Simethicone (GAS-X PO) Take 2 tablets by mouth daily as needed (gas).    . traMADol (ULTRAM) 50 MG tablet Take 1 tablet (50 mg total) by mouth every 6 (six) hours as needed. (Patient taking differently: Take 50 mg by mouth every 6 (six) hours as needed (LEG PAIN). ) 20 tablet 0  . valACYclovir (VALTREX) 1000 MG tablet Take 2,000 mg by mouth every 12 (twelve) hours as needed (FOR COLD SORES). Max 2 doses    . vitamin C  (ASCORBIC ACID) 500 MG tablet Take 500 mg by mouth daily as needed (immune support).    . vitamin E (VITAMIN E) 400 UNIT capsule Take 400 Units by mouth daily.    [name] zolpidem (AMBIEN) 10 MG tablet Take 10 mg by mouth at bedtime as needed for sleep.     No current facility-administered medications for this encounter.     Allergies  Allergen Reactions  . Penicillins Rash    DID THE REACTION INVOLVE: Swelling of the face/tongue/throat, SOB, or low BP? No Sudden or severe rash/hives, skin peeling, or the inside of the mouth or nose? Unknown Did it require medical treatment? Unknown When did it last happen?Childhood Allergy If all above answers are "NO", may proceed with cephalosporin use.      Social History   Socioeconomic History  . Marital status: Married    Spouse name: Not on file  . Number of children: Not on file  . Years of education: Not on file  . Highest education level: Not on file  Occupational History  . Not on file  Social Needs  . Financial resource strain: Not on file  . Food insecurity:    Worry: Not on file    Inability: Not on file  . Transportation needs:    Medical: Not on file    Non-medical: Not on file  Tobacco Use  . Smoking status: Former Research scientist (life sciences)  . Smokeless tobacco: Never Used  Substance and Sexual Activity  . Alcohol use: Yes    Comment: 3-4 drinks per day  . Drug use: No  . Sexual activity: Yes  Lifestyle  . Physical activity:    Days per week: Not on file    Minutes per session: Not on file  . Stress: Not on file  Relationships  . Social connections:    Talks on phone: Not on file    Gets together: Not on file    Attends religious service: Not on file    Active member of club or organization: Not on file    Attends meetings of clubs or organizations: Not on file    Relationship status: Not on file  . Intimate partner violence:    Fear of current or ex partner: Not on file    Emotionally abused: Not on file    Physically  abused: Not on file    Forced sexual activity: Not on file  Other Topics Concern  . Not on file  Social History Narrative   Lives in [place]   Retired from [employer]    Family History  Problem Relation Age of Onset  . Heart attack Father   . Hypertension Father   . Hypertension Mother     ROS- All systems are reviewed and negative except as per the HPI above  Physical Exam:  Vitals:   04/03/18 1033  BP: 110/62  Pulse: 81  Weight: 102.5 kg  Height: 6\' 1"  (1.854 m)   Wt Readings from Last 3 Encounters:  04/03/18 102.5 kg  03/22/18 100.7 kg  02/13/18 107 kg    Labs: Lab Results  Component Value Date   NA 139 03/13/2018   K 4.3 03/13/2018   CL 104 03/13/2018   CO2 28 03/13/2018   GLUCOSE 113 (H) 03/13/2018   BUN 10 03/13/2018   CREATININE 1.07 03/13/2018   CALCIUM 9.0 03/13/2018   MG 2.4 (H) 11/22/2017   Lab Results  Component Value Date   INR 1.0 06/17/2016   Lab Results  Component Value Date   CHOL 176 08/09/2010   HDL 46 08/09/2010   LDLCALC 83 08/09/2010   TRIG 234 (H) 08/09/2010     GEN- The patient is well appearing, alert and oriented x 3 today.   Head- normocephalic, atraumatic Eyes-  Sclera clear, conjunctiva pink Ears- hearing intact Oropharynx- clear Neck- supple, no JVP Lymph- no cervical lymphadenopathy Lungs- Clear to ausculation bilaterally, normal work of breathing Heart- irregular rate and rhythm, no murmurs, rubs or gallops, PMI not laterally displaced GI- soft, NT, ND, + BS Extremities- no clubbing, cyanosis, or edema MS- no significant deformity or atrophy Skin- no rash or lesion Psych- euthymic mood, full affect Neuro- strength and sensation are intact  EKG- afib at 81 bpm, qrs int 92 ms, qtc 425 ms Epic records reviewed   Assessment and Plan: 1. afib  S/p ablation  Unfortunatley had URI immediatly following ablation Is finishing antibiotics today Will bring back in 10 days and if he feels he is over his infection, will  set up for cardioversion He is rate controlled so no change to flecainide/BB/CCB  2. CHA2DS2VASc score of at least 3 Pt did miss am dose of eliquis the day after the ablation as he was sick He will be eligible for cardioversion after 3/19  F/u here in 10 days  Butch Penny C. Regena Delucchi, [facility] [address] [place], [place] [address] [phone]

## 2018-04-06 ENCOUNTER — Other Ambulatory Visit: Payer: Self-pay | Admitting: Family Medicine

## 2018-04-06 ENCOUNTER — Other Ambulatory Visit: Payer: Self-pay

## 2018-04-06 ENCOUNTER — Ambulatory Visit
Admission: RE | Admit: 2018-04-06 | Discharge: 2018-04-06 | Disposition: A | Discharge status: Home / Self Care | Payer: PPO | Source: Ambulatory Visit | Attending: Family Medicine | Admitting: Family Medicine

## 2018-04-06 DIAGNOSIS — R05 Cough: Secondary | ICD-10-CM | POA: Diagnosis not present

## 2018-04-06 DIAGNOSIS — I4891 Unspecified atrial fibrillation: Secondary | ICD-10-CM | POA: Diagnosis not present

## 2018-04-06 DIAGNOSIS — R059 Cough, unspecified: Secondary | ICD-10-CM

## 2018-04-06 DIAGNOSIS — J989 Respiratory disorder, unspecified: Secondary | ICD-10-CM | POA: Diagnosis not present

## 2018-04-13 ENCOUNTER — Encounter (HOSPITAL_COMMUNITY): Payer: Self-pay | Admitting: Nurse Practitioner

## 2018-04-13 ENCOUNTER — Other Ambulatory Visit: Payer: Self-pay

## 2018-04-13 ENCOUNTER — Ambulatory Visit (HOSPITAL_COMMUNITY)
Admission: RE | Admit: 2018-04-13 | Discharge: 2018-04-13 | Disposition: A | Discharge status: Home / Self Care | Payer: PPO | Source: Ambulatory Visit | Attending: Nurse Practitioner | Admitting: Nurse Practitioner

## 2018-04-13 VITALS — BP 110/74 | HR 89 | Ht 73.0 in | Wt 226.0 lb

## 2018-04-13 DIAGNOSIS — I428 Other cardiomyopathies: Secondary | ICD-10-CM | POA: Diagnosis not present

## 2018-04-13 DIAGNOSIS — I4819 Other persistent atrial fibrillation: Secondary | ICD-10-CM | POA: Diagnosis not present

## 2018-04-13 DIAGNOSIS — I480 Paroxysmal atrial fibrillation: Secondary | ICD-10-CM | POA: Insufficient documentation

## 2018-04-13 DIAGNOSIS — Z7901 Long term (current) use of anticoagulants: Secondary | ICD-10-CM | POA: Diagnosis not present

## 2018-04-13 DIAGNOSIS — G4700 Insomnia, unspecified: Secondary | ICD-10-CM | POA: Diagnosis not present

## 2018-04-13 DIAGNOSIS — Z87891 Personal history of nicotine dependence: Secondary | ICD-10-CM | POA: Insufficient documentation

## 2018-04-13 DIAGNOSIS — I10 Essential (primary) hypertension: Secondary | ICD-10-CM | POA: Diagnosis not present

## 2018-04-13 DIAGNOSIS — Z79899 Other long term (current) drug therapy: Secondary | ICD-10-CM | POA: Insufficient documentation

## 2018-04-13 NOTE — Progress Notes (Signed)
Primary Care Physician: [name], MD Referring Physician: Dr. Monica Martinez is a 68 y.o. male with a h/o paroxysmal afib that had ablation 2/26. He had a cough prior to the procedure but the day afterward had brownish sputum and started running a fever. He went to PCP and was given a course of antibiotics for pneumonia. He was seen there Friday for f/u and given another 5 days of doxycycline. He is now not having fever but is still coughing. He feels that he has been out of rhythm  since the procedure. No swallowing or groin issues. He does feel shortness of breath. His weight is stable. Afib is rate controlled.  F/u with pt, 3/19,  re recent ablation and Pneumonia. He was to return and if respiratory staus was back to baseline go ahead with cardioversion. He is not running fever but feels like he needs another round of steroids for breathing to completely return to baseline.  PCP wanted to see if this was ok. I feel his afib does not seem to be bothering him as much right now and it is  important to let him fully recover form Pneumonia, then address afib/cardioversion..  Today, he denies symptoms of palpitations, chest pain, shortness of breath, orthopnea, PND, lower extremity edema, dizziness, presyncope, syncope, or neurologic sequela. The patient is tolerating medications without difficulties and is otherwise without complaint today.   Past Medical History:  Diagnosis Date  . Atrial flutter ([place]) 2007   s/p CTI ablation by Dr Lovena Le  . Back pain   . Hyperlipidemia   . Hypertension   . Insomnia   . NICM (nonischemic cardiomyopathy) ([place])    a. 2005 - tachy mediated. normalized after return of NSR.  [name] Obesity   . Paroxysmal atrial fibrillation [facility])    Past Surgical History:  Procedure Laterality Date  . ATRIAL FIBRILLATION ABLATION N/A 03/22/2018   Procedure: ATRIAL FIBRILLATION ABLATION;  Surgeon: Thompson Grayer, MD;  Location: [place] CV LAB;  Service:  Cardiovascular;  Laterality: N/A;  . atrial flutter ablation  06/10/2005   CTI ablation by Dr Lovena Le for atrial flutter  . CARDIOVERSION N/A 07/20/2016   Procedure: CARDIOVERSION;  Surgeon: Minus Breeding, MD;  Location: [facility] ENDOSCOPY;  Service: Cardiovascular;  Laterality: N/A;  . CARDIOVERSION N/A 12/13/2017   Procedure: CARDIOVERSION;  Surgeon: Thayer Headings, MD;  Location: [facility] ENDOSCOPY;  Service: Cardiovascular;  Laterality: N/A;  . CARDIOVERSION N/A 01/30/2018   Procedure: CARDIOVERSION;  Surgeon: Fay Records, MD;  Location: [facility] ENDOSCOPY;  Service: Cardiovascular;  Laterality: N/A;  . NO PAST SURGERIES      Current Outpatient Medications  Medication Sig Dispense Refill  . acetaminophen (TYLENOL) 500 MG tablet Take 500 mg by mouth daily as needed (nerve pain).    [name] amitriptyline (ELAVIL) 50 MG tablet Take 50-75 mg by mouth at bedtime.     [name] atorvastatin (LIPITOR) 10 MG tablet Take 10 mg by mouth every evening.     . diltiazem (CARTIA XT) 180 MG 24 hr capsule Take 1 capsule (180 mg total) by mouth daily. (Patient taking differently: Take 180 mg by mouth every evening. ) 90 capsule 3  . ELIQUIS 5 MG TABS tablet TAKE 1 TABLET BY MOUTH TWICE DAILY 180 tablet 3  . flecainide (TAMBOCOR) 100 MG tablet TAKE 1 TABLET(100 MG) BY MOUTH TWICE DAILY 180 tablet 0  . guaiFENesin-codeine (VIRTUSSIN A/C) 100-10 MG/5ML syrup Take 5 mLs by mouth 3 (three) times daily as needed  for cough.    . metoprolol succinate (TOPROL-XL) 25 MG 24 hr tablet Take 1 tablet (25 mg total) by mouth daily. 90 tablet 0  . Multiple Vitamin (MULTIVITAMIN WITH MINERALS) TABS tablet Take 1 tablet by mouth daily.    . Omega-3 Fatty Acids (FISH OIL) 1200 MG CAPS Take 1,200 mg by mouth 2 (two) times daily.     . pantoprazole (PROTONIX) 40 MG tablet TAKE 1 TABLET BY MOUTH EVERY DAY 90 tablet 1  . Simethicone (GAS-X PO) Take 2 tablets by mouth daily as needed (gas).    . valACYclovir (VALTREX) 1000 MG tablet Take 2,000 mg by mouth  every 12 (twelve) hours as needed (FOR COLD SORES). Max 2 doses    . vitamin E (VITAMIN E) 400 UNIT capsule Take 400 Units by mouth daily.    . traMADol (ULTRAM) 50 MG tablet Take 1 tablet (50 mg total) by mouth every 6 (six) hours as needed. (Patient not taking: Reported on 04/13/2018) 20 tablet 0  . vitamin C (ASCORBIC ACID) 500 MG tablet Take 500 mg by mouth daily as needed (immune support).    . zolpidem (AMBIEN) 10 MG tablet Take 10 mg by mouth at bedtime as needed for sleep.     No current facility-administered medications for this encounter.     Allergies  Allergen Reactions  . Penicillins Rash    DID THE REACTION INVOLVE: Swelling of the face/tongue/throat, SOB, or low BP? No Sudden or severe rash/hives, skin peeling, or the inside of the mouth or nose? Unknown Did it require medical treatment? Unknown When did it last happen?Childhood Allergy If all above answers are "NO", may proceed with cephalosporin use.      Social History   Socioeconomic History  . Marital status: Married    Spouse name: Not on file  . Number of children: Not on file  . Years of education: Not on file  . Highest education level: Not on file  Occupational History  . Not on file  Social Needs  . Financial resource strain: Not on file  . Food insecurity:    Worry: Not on file    Inability: Not on file  . Transportation needs:    Medical: Not on file    Non-medical: Not on file  Tobacco Use  . Smoking status: Former Research scientist (life sciences)  . Smokeless tobacco: Never Used  Substance and Sexual Activity  . Alcohol use: Yes    Comment: 3-4 drinks per day  . Drug use: No  . Sexual activity: Yes  Lifestyle  . Physical activity:    Days per week: Not on file    Minutes per session: Not on file  . Stress: Not on file  Relationships  . Social connections:    Talks on phone: Not on file    Gets together: Not on file    Attends religious service: Not on file    Active member of club or organization: Not  on file    Attends meetings of clubs or organizations: Not on file    Relationship status: Not on file  . Intimate partner violence:    Fear of current or ex partner: Not on file    Emotionally abused: Not on file    Physically abused: Not on file    Forced sexual activity: Not on file  Other Topics Concern  . Not on file  Social History Narrative   Lives in [place]   Retired from [employer]  Problem Relation Age of Onset  . Heart attack Father   . Hypertension Father   . Hypertension Mother     ROS- All systems are reviewed and negative except as per the HPI above  Physical Exam: Vitals:   04/13/18 1017  BP: 110/74  Pulse: 89  Weight: 102.5 kg  Height: 6\' 1"  (1.854 m)   Wt Readings from Last 3 Encounters:  04/13/18 102.5 kg  04/03/18 102.5 kg  03/22/18 100.7 kg    Labs: Lab Results  Component Value Date   NA 139 03/13/2018   K 4.3 03/13/2018   CL 104 03/13/2018   CO2 28 03/13/2018   GLUCOSE 113 (H) 03/13/2018   BUN 10 03/13/2018   CREATININE 1.07 03/13/2018   CALCIUM 9.0 03/13/2018   MG 2.4 (H) 11/22/2017   Lab Results  Component Value Date   INR 1.0 06/17/2016   Lab Results  Component Value Date   CHOL 176 08/09/2010   HDL 46 08/09/2010   LDLCALC 83 08/09/2010   TRIG 234 (H) 08/09/2010     GEN- The patient is well appearing, alert and oriented x 3 today.   Head- normocephalic, atraumatic Eyes-  Sclera clear, conjunctiva pink Ears- hearing intact Oropharynx- clear Neck- supple, no JVP Lymph- no cervical lymphadenopathy Lungs- Clear to ausculation bilaterally, normal work of breathing Heart- irregular rate and rhythm, no murmurs, rubs or gallops, PMI not laterally displaced GI- soft, NT, ND, + BS Extremities- no clubbing, cyanosis, or edema MS- no significant deformity or atrophy Skin- no rash or lesion Psych- euthymic mood, full affect Neuro- strength and sensation are intact  EKG- afib at 89  bpm, qrs int 92 ms, qtc 433 ms  Epic records reviewed   Assessment and Plan: 1. afib  S/p ablation  Unfortunatley had URI immediatly following ablation He feels he is still not at baseline for his lungs and feels he needs another course of steroids PCP is willing to order if it will not interfere with afib plans. Will delay cardioversion until his respiratory status in back to baseline. Pt will call when he is thru with steroids and will see if elective cardioversion's are being done at that time with Covid 19 elective procedure restrictions He is rate controlled so no change to flecainide/BB/CCB  2. CHA2DS2VASc score of at least 3 Reminded not to miss any doses of eliquis  F/u per above.  Geroge Baseman Pearse Shiffler, [facility] [address] [place], [place] [address] [phone]

## 2018-04-20 ENCOUNTER — Ambulatory Visit (HOSPITAL_COMMUNITY): Payer: PPO | Admitting: Nurse Practitioner

## 2018-04-24 ENCOUNTER — Other Ambulatory Visit: Payer: Self-pay | Admitting: Interventional Cardiology

## 2018-04-24 DIAGNOSIS — R509 Fever, unspecified: Secondary | ICD-10-CM | POA: Diagnosis not present

## 2018-04-24 DIAGNOSIS — R350 Frequency of micturition: Secondary | ICD-10-CM | POA: Diagnosis not present

## 2018-04-24 DIAGNOSIS — I480 Paroxysmal atrial fibrillation: Secondary | ICD-10-CM

## 2018-04-24 MED ORDER — METOPROLOL SUCCINATE ER 25 MG PO TB24: 25.0000 mg | ORAL_TABLET | Freq: Every day | ORAL | 1 refills | Status: DC

## 2018-04-24 NOTE — Telephone Encounter (Signed)
Pt's medication was sent to pt's pharmacy as requested. Confirmation received.  °

## 2018-05-02 ENCOUNTER — Other Ambulatory Visit: Payer: Self-pay | Admitting: Interventional Cardiology

## 2018-05-02 DIAGNOSIS — I480 Paroxysmal atrial fibrillation: Secondary | ICD-10-CM

## 2018-05-09 DIAGNOSIS — M5440 Lumbago with sciatica, unspecified side: Secondary | ICD-10-CM | POA: Diagnosis not present

## 2018-05-09 DIAGNOSIS — M4150 Other secondary scoliosis, site unspecified: Secondary | ICD-10-CM | POA: Diagnosis not present

## 2018-06-20 ENCOUNTER — Telehealth: Payer: Self-pay

## 2018-06-22 ENCOUNTER — Other Ambulatory Visit: Payer: Self-pay | Admitting: Interventional Cardiology

## 2018-06-22 MED ORDER — FLECAINIDE ACETATE 100 MG PO TABS: ORAL_TABLET | ORAL | 1 refills | Status: DC

## 2018-06-22 NOTE — Telephone Encounter (Signed)
Spoke with pt regarding appt on 06/23/18. Pt was advise to check vitals prior to appt. Pt stated he did not have any questions at this time.

## 2018-06-22 NOTE — Telephone Encounter (Signed)
Pt's medication was sent to pt's pharmacy as requested. Confirmation received.  °

## 2018-06-23 ENCOUNTER — Other Ambulatory Visit: Payer: Self-pay

## 2018-06-23 ENCOUNTER — Telehealth: Payer: Self-pay

## 2018-06-23 ENCOUNTER — Other Ambulatory Visit: Payer: PPO | Admitting: *Deleted

## 2018-06-23 ENCOUNTER — Encounter: Payer: Self-pay | Admitting: Internal Medicine

## 2018-06-23 ENCOUNTER — Telehealth (INDEPENDENT_AMBULATORY_CARE_PROVIDER_SITE_OTHER): Payer: PPO | Admitting: Internal Medicine

## 2018-06-23 ENCOUNTER — Other Ambulatory Visit (HOSPITAL_COMMUNITY)
Admission: RE | Admit: 2018-06-23 | Discharge: 2018-06-23 | Disposition: A | Discharge status: Home / Self Care | Payer: PPO | Source: Ambulatory Visit | Attending: Cardiology | Admitting: Cardiology

## 2018-06-23 VITALS — BP 101/69 | HR 97 | Ht 73.0 in | Wt 216.0 lb

## 2018-06-23 DIAGNOSIS — I4819 Other persistent atrial fibrillation: Secondary | ICD-10-CM | POA: Diagnosis not present

## 2018-06-23 DIAGNOSIS — I10 Essential (primary) hypertension: Secondary | ICD-10-CM

## 2018-06-23 DIAGNOSIS — Z1159 Encounter for screening for other viral diseases: Secondary | ICD-10-CM | POA: Diagnosis not present

## 2018-06-23 DIAGNOSIS — Z8679 Personal history of other diseases of the circulatory system: Secondary | ICD-10-CM

## 2018-06-23 LAB — CBC WITH DIFFERENTIAL/PLATELET
Basophils Absolute: 0.1 10*3/uL (ref 0.0–0.2)
Basos: 1 %
EOS (ABSOLUTE): 0.1 10*3/uL (ref 0.0–0.4)
Eos: 1 %
Hematocrit: 46.1 % (ref 37.5–51.0)
Hemoglobin: 15.4 g/dL (ref 13.0–17.7)
Immature Grans (Abs): 0 10*3/uL (ref 0.0–0.1)
Immature Granulocytes: 0 %
Lymphocytes Absolute: 2.6 10*3/uL (ref 0.7–3.1)
Lymphs: 36 %
MCH: 31.7 pg (ref 26.6–33.0)
MCHC: 33.4 g/dL (ref 31.5–35.7)
MCV: 95 fL (ref 79–97)
Monocytes Absolute: 0.7 10*3/uL (ref 0.1–0.9)
Monocytes: 10 %
Neutrophils Absolute: 3.6 10*3/uL (ref 1.4–7.0)
Neutrophils: 52 %
Platelets: 150 10*3/uL (ref 150–450)
RBC: 4.86 x10E6/uL (ref 4.14–5.80)
RDW: 13.8 % (ref 11.6–15.4)
WBC: 7.1 10*3/uL (ref 3.4–10.8)

## 2018-06-23 LAB — BASIC METABOLIC PANEL
BUN/Creatinine Ratio: 9 — ABNORMAL LOW (ref 10–24)
BUN: 10 mg/dL (ref 8–27)
CO2: 27 mmol/L (ref 20–29)
Calcium: 9.4 mg/dL (ref 8.6–10.2)
Chloride: 99 mmol/L (ref 96–106)
Creatinine, Ser: 1.1 mg/dL (ref 0.76–1.27)
GFR calc Af Amer: 79 mL/min/{1.73_m2} (ref >59)
GFR calc non Af Amer: 68 mL/min/{1.73_m2} (ref >59)
Glucose: 88 mg/dL (ref 65–99)
Potassium: 4.2 mmol/L (ref 3.5–5.2)
Sodium: 138 mmol/L (ref 134–144)

## 2018-06-23 NOTE — Telephone Encounter (Signed)
Call placed to Pt.  Pt to have DCCV on June 2 at 12:00 pm  Will get labs/covid test today

## 2018-06-23 NOTE — Progress Notes (Signed)
Electrophysiology TeleHealth Note   Due to national recommendations of social distancing due to COVID 19, an audio/video telehealth visit is felt to be most appropriate for this patient at this time.  See MyChart message from today for the patient's consent to telehealth for [facility].   Date:  06/23/2018   ID:  Gary [name], DOB 31-May-1950, MRN [id number]  Location: patient's home  Provider location: [address] [place]  Evaluation Performed: Follow-up visit  PCP:  [name], MD  Cardiologist:  Larae Grooms, MD Electrophysiologist:  Dr Rayann Heman  Chief Complaint:  afib  History of Present Illness:    Gary [name] is a 68 y.o. male who presents via audio/video conferencing for a telehealth visit today.  Since his ablation, the patient reports doing reasonably well. He had URI symptoms post ablation for which he was on steroids.  He converted to afib with ERAF.  Unfortunately, he has been in afib since that time.  Due to COVID 19, he did not have cardioversion.  He has SOB with moderate activity.  Today, he denies symptoms of palpitations, chest pain,  lower extremity edema, dizziness, presyncope, or syncope.  The patient is otherwise without complaint today.  He finds that his heart rate is elevated. The patient denies symptoms of fevers, chills, cough, or new SOB worrisome for COVID 19.  Past Medical History:  Diagnosis Date  . Atrial flutter ([place]) 2007   s/p CTI ablation by Dr Lovena Le  . Back pain   . Hyperlipidemia   . Hypertension   . Insomnia   . NICM (nonischemic cardiomyopathy) ([place])    a. 2005 - tachy mediated. normalized after return of NSR.  [name] Obesity   . Paroxysmal atrial fibrillation [facility])     Past Surgical History:  Procedure Laterality Date  . ATRIAL FIBRILLATION ABLATION N/A 03/22/2018   Procedure: ATRIAL FIBRILLATION ABLATION;  Surgeon: Thompson Grayer, MD;  Location: [place] CV LAB;  Service: Cardiovascular;  Laterality:  N/A;  . atrial flutter ablation  06/10/2005   CTI ablation by Dr Lovena Le for atrial flutter  . CARDIOVERSION N/A 07/20/2016   Procedure: CARDIOVERSION;  Surgeon: Minus Breeding, MD;  Location: [facility] ENDOSCOPY;  Service: Cardiovascular;  Laterality: N/A;  . CARDIOVERSION N/A 12/13/2017   Procedure: CARDIOVERSION;  Surgeon: Thayer Headings, MD;  Location: [facility] ENDOSCOPY;  Service: Cardiovascular;  Laterality: N/A;  . CARDIOVERSION N/A 01/30/2018   Procedure: CARDIOVERSION;  Surgeon: Fay Records, MD;  Location: [facility] ENDOSCOPY;  Service: Cardiovascular;  Laterality: N/A;  . NO PAST SURGERIES      Current Outpatient Medications  Medication Sig Dispense Refill  . acetaminophen (TYLENOL) 500 MG tablet Take 500 mg by mouth daily as needed (nerve pain).    [name] amitriptyline (ELAVIL) 50 MG tablet Take 50-75 mg by mouth at bedtime.     [name] atorvastatin (LIPITOR) 10 MG tablet Take 10 mg by mouth every evening.     . diltiazem (CARDIZEM CD) 180 MG 24 hr capsule Take 1 capsule (180 mg total) by mouth daily. 90 capsule 1  . ELIQUIS 5 MG TABS tablet TAKE 1 TABLET BY MOUTH TWICE DAILY 180 tablet 3  . flecainide (TAMBOCOR) 100 MG tablet TAKE 1 TABLET(100 MG) BY MOUTH TWICE DAILY. Please make appt with Dr. Irish Lack for October for future refills. 1st attempt 180 tablet 1  . guaiFENesin-codeine (VIRTUSSIN A/C) 100-10 MG/5ML syrup Take 5 mLs by mouth 3 (three) times daily as needed for cough.    [name]  metoprolol succinate (TOPROL-XL) 25 MG 24 hr tablet Take 1 tablet (25 mg total) by mouth daily. 90 tablet 1  . Multiple Vitamin (MULTIVITAMIN WITH MINERALS) TABS tablet Take 1 tablet by mouth daily.    . Omega-3 Fatty Acids (FISH OIL) 1200 MG CAPS Take 1,200 mg by mouth 2 (two) times daily.     . pantoprazole (PROTONIX) 40 MG tablet TAKE 1 TABLET BY MOUTH EVERY DAY 90 tablet 1  . Simethicone (GAS-X PO) Take 2 tablets by mouth daily as needed (gas).    . valACYclovir (VALTREX) 1000 MG tablet Take 2,000 mg by mouth every 12  (twelve) hours as needed (FOR COLD SORES). Max 2 doses    . vitamin C (ASCORBIC ACID) 500 MG tablet Take 500 mg by mouth daily as needed (immune support).    . vitamin E (VITAMIN E) 400 UNIT capsule Take 400 Units by mouth daily.    [name] zolpidem (AMBIEN) 10 MG tablet Take 10 mg by mouth at bedtime as needed for sleep.     No current facility-administered medications for this visit.     Allergies:   Penicillins   Social History:  The patient  reports that he has quit smoking. He has never used smokeless tobacco. He reports current alcohol use. He reports that he does not use drugs.   Family History:  The patient's  family history includes Heart attack in his father; Hypertension in his father and mother.   ROS:  Please see the history of present illness.   All other systems are personally reviewed and negative.    Exam:    Vital Signs:  BP 101/69   Pulse 97   Ht 6\' 1"  (1.854 m)   Wt 216 lb (98 kg)   BMI 28.50 kg/m   Well appearing, alert and conversant, regular work of breathing,  good skin color Eyes- anicteric, neuro- grossly intact, skin- no apparent rash or lesions or cyanosis, mouth- oral mucosa is pink   Labs/Other Tests and Data Reviewed:    Recent Labs: 11/22/2017: Magnesium 2.4; TSH 3.090 03/13/2018: BUN 10; Creatinine, Ser 1.07; Hemoglobin 15.6; Platelets 214; Potassium 4.3; Sodium 139   Wt Readings from Last 3 Encounters:  06/23/18 216 lb (98 kg)  04/13/18 226 lb (102.5 kg)  04/03/18 226 lb (102.5 kg)     Other studies personally reviewed: Additional studies/ records that were reviewed today include: AF clinic notes, my prior ablation notes  Review of the above records today demonstrates: as above Prior radiographs: Cardiac CT 03/17/2018  ASSESSMENT & PLAN:    1.  Persistent afib He remains in AF post ablation, with ERAF. I would advise cardioversion at this time. Risks and benefits to cardioversion were discussed with the patient today. He wishes to  proceed. Continue flecainide.  importance of compliance with eliquis was discussed today chads2vascs score is 2.  Continue eliquis If he fails cardioversion, our options would be long term rate control, repeat ablation, tikosyn or amiodarone.  2. HTN Stable No change required today  3. Previous tachycardia mediated CM EF normal 11/24/17 V rates are controlled  4. COVID 19 screen The patient denies symptoms of COVID 19 at this time.  The importance of social distancing was discussed today.  Follow-up:  AF clinic 2 weeks post cardioversion  Current medicines are reviewed at length with the patient today.   The patient does not have concerns regarding his medicines.  The following changes were made today:  none  Labs/ tests ordered today  include:  No orders of the defined types were placed in this encounter.   Patient Risk:  after full review of this patients clinical status, I feel that they are at moderate risk at this time.  Today, I have spent 20 minutes with the patient with telehealth technology discussing afib .    Army Fossa, MD  06/23/2018 8:51 AM     [facility] [address] [place] [address] [phone] (office) [phone] (fax)

## 2018-06-23 NOTE — H&P (View-Only) (Signed)
Electrophysiology TeleHealth Note   Due to national recommendations of social distancing due to COVID 19, an audio/video telehealth visit is felt to be most appropriate for this patient at this time.  See MyChart message from today for the patient's consent to telehealth for [facility].   Date:  06/23/2018   ID:  Gary [name], DOB [birth year], MRN [id number]  Location: patient's home  Provider location: [address], [place]  Evaluation Performed: Follow-up visit  PCP:  [name], MD  Cardiologist:  Larae Grooms, MD Electrophysiologist:  Dr Rayann Heman  Chief Complaint:  afib  History of Present Illness:    Gary [name] is a 68 y.o. male who presents via audio/video conferencing for a telehealth visit today.  Since his ablation, the patient reports doing reasonably well. He had URI symptoms post ablation for which he was on steroids.  He converted to afib with ERAF.  Unfortunately, he has been in afib since that time.  Due to COVID 19, he did not have cardioversion.  He has SOB with moderate activity.  Today, he denies symptoms of palpitations, chest pain,  lower extremity edema, dizziness, presyncope, or syncope.  The patient is otherwise without complaint today.  He finds that his heart rate is elevated. The patient denies symptoms of fevers, chills, cough, or new SOB worrisome for COVID 19.  Past Medical History:  Diagnosis Date  . Atrial flutter ([place]) 2007   s/p CTI ablation by Dr Lovena Le  . Back pain   . Hyperlipidemia   . Hypertension   . Insomnia   . NICM (nonischemic cardiomyopathy) ([place])    a. 2005 - tachy mediated. normalized after return of NSR.  [name] Obesity   . Paroxysmal atrial fibrillation [facility])     Past Surgical History:  Procedure Laterality Date  . ATRIAL FIBRILLATION ABLATION N/A 03/22/2018   Procedure: ATRIAL FIBRILLATION ABLATION;  Surgeon: Thompson Grayer, MD;  Location: [place] CV LAB;  Service: Cardiovascular;  Laterality:  N/A;  . atrial flutter ablation  06/10/2005   CTI ablation by Dr Lovena Le for atrial flutter  . CARDIOVERSION N/A 07/20/2016   Procedure: CARDIOVERSION;  Surgeon: Minus Breeding, MD;  Location: [facility] ENDOSCOPY;  Service: Cardiovascular;  Laterality: N/A;  . CARDIOVERSION N/A 12/13/2017   Procedure: CARDIOVERSION;  Surgeon: Thayer Headings, MD;  Location: [facility] ENDOSCOPY;  Service: Cardiovascular;  Laterality: N/A;  . CARDIOVERSION N/A 01/30/2018   Procedure: CARDIOVERSION;  Surgeon: Fay Records, MD;  Location: [facility] ENDOSCOPY;  Service: Cardiovascular;  Laterality: N/A;  . NO PAST SURGERIES      Current Outpatient Medications  Medication Sig Dispense Refill  . acetaminophen (TYLENOL) 500 MG tablet Take 500 mg by mouth daily as needed (nerve pain).    [name] amitriptyline (ELAVIL) 50 MG tablet Take 50-75 mg by mouth at bedtime.     [name] atorvastatin (LIPITOR) 10 MG tablet Take 10 mg by mouth every evening.     . diltiazem (CARDIZEM CD) 180 MG 24 hr capsule Take 1 capsule (180 mg total) by mouth daily. 90 capsule 1  . ELIQUIS 5 MG TABS tablet TAKE 1 TABLET BY MOUTH TWICE DAILY 180 tablet 3  . flecainide (TAMBOCOR) 100 MG tablet TAKE 1 TABLET(100 MG) BY MOUTH TWICE DAILY. Please make appt with Dr. Irish Lack for October for future refills. 1st attempt 180 tablet 1  . guaiFENesin-codeine (VIRTUSSIN A/C) 100-10 MG/5ML syrup Take 5 mLs by mouth 3 (three) times daily as needed for cough.    [name]  metoprolol succinate (TOPROL-XL) 25 MG 24 hr tablet Take 1 tablet (25 mg total) by mouth daily. 90 tablet 1  . Multiple Vitamin (MULTIVITAMIN WITH MINERALS) TABS tablet Take 1 tablet by mouth daily.    . Omega-3 Fatty Acids (FISH OIL) 1200 MG CAPS Take 1,200 mg by mouth 2 (two) times daily.     . pantoprazole (PROTONIX) 40 MG tablet TAKE 1 TABLET BY MOUTH EVERY DAY 90 tablet 1  . Simethicone (GAS-X PO) Take 2 tablets by mouth daily as needed (gas).    . valACYclovir (VALTREX) 1000 MG tablet Take 2,000 mg by mouth every 12  (twelve) hours as needed (FOR COLD SORES). Max 2 doses    . vitamin C (ASCORBIC ACID) 500 MG tablet Take 500 mg by mouth daily as needed (immune support).    . vitamin E (VITAMIN E) 400 UNIT capsule Take 400 Units by mouth daily.    [name] zolpidem (AMBIEN) 10 MG tablet Take 10 mg by mouth at bedtime as needed for sleep.     No current facility-administered medications for this visit.     Allergies:   Penicillins   Social History:  The patient  reports that he has quit smoking. He has never used smokeless tobacco. He reports current alcohol use. He reports that he does not use drugs.   Family History:  The patient's  family history includes Heart attack in his father; Hypertension in his father and mother.   ROS:  Please see the history of present illness.   All other systems are personally reviewed and negative.    Exam:    Vital Signs:  BP 101/69   Pulse 97   Ht 6\' 1"  (1.854 m)   Wt 216 lb (98 kg)   BMI 28.50 kg/m   Well appearing, alert and conversant, regular work of breathing,  good skin color Eyes- anicteric, neuro- grossly intact, skin- no apparent rash or lesions or cyanosis, mouth- oral mucosa is pink   Labs/Other Tests and Data Reviewed:    Recent Labs: 11/22/2017: Magnesium 2.4; TSH 3.090 03/13/2018: BUN 10; Creatinine, Ser 1.07; Hemoglobin 15.6; Platelets 214; Potassium 4.3; Sodium 139   Wt Readings from Last 3 Encounters:  06/23/18 216 lb (98 kg)  04/13/18 226 lb (102.5 kg)  04/03/18 226 lb (102.5 kg)     Other studies personally reviewed: Additional studies/ records that were reviewed today include: AF clinic notes, my prior ablation notes  Review of the above records today demonstrates: as above Prior radiographs: Cardiac CT 03/17/2018  ASSESSMENT & PLAN:    1.  Persistent afib He remains in AF post ablation, with ERAF. I would advise cardioversion at this time. Risks and benefits to cardioversion were discussed with the patient today. He wishes to  proceed. Continue flecainide.  importance of compliance with eliquis was discussed today chads2vascs score is 2.  Continue eliquis If he fails cardioversion, our options would be long term rate control, repeat ablation, tikosyn or amiodarone.  2. HTN Stable No change required today  3. Previous tachycardia mediated CM EF normal 11/24/17 V rates are controlled  4. COVID 19 screen The patient denies symptoms of COVID 19 at this time.  The importance of social distancing was discussed today.  Follow-up:  AF clinic 2 weeks post cardioversion  Current medicines are reviewed at length with the patient today.   The patient does not have concerns regarding his medicines.  The following changes were made today:  none  Labs/ tests ordered today  include:  No orders of the defined types were placed in this encounter.   Patient Risk:  after full review of this patients clinical status, I feel that they are at moderate risk at this time.  Today, I have spent 20 minutes with the patient with telehealth technology discussing afib .    Army Fossa, MD  06/23/2018 8:51 AM     [facility] [address] [place] [address] [phone] (office) [phone] (fax)

## 2018-06-24 LAB — NOVEL CORONAVIRUS, NAA (HOSP ORDER, SEND-OUT TO REF LAB; TAT 18-24 HRS): SARS-CoV-2, NAA: NOT DETECTED

## 2018-06-27 ENCOUNTER — Ambulatory Visit (HOSPITAL_COMMUNITY): Payer: PPO | Admitting: Registered Nurse

## 2018-06-27 ENCOUNTER — Telehealth: Payer: Self-pay | Admitting: Internal Medicine

## 2018-06-27 ENCOUNTER — Encounter (HOSPITAL_COMMUNITY)
Admission: RE | Disposition: A | Discharge status: Home / Self Care | Payer: Self-pay | Source: Home / Self Care | Attending: Cardiology

## 2018-06-27 ENCOUNTER — Ambulatory Visit (HOSPITAL_COMMUNITY)
Admission: RE | Admit: 2018-06-27 | Discharge: 2018-06-27 | Disposition: A | Discharge status: Home / Self Care | Payer: PPO | Attending: Cardiology | Admitting: Cardiology

## 2018-06-27 ENCOUNTER — Encounter (HOSPITAL_COMMUNITY): Payer: Self-pay | Admitting: *Deleted

## 2018-06-27 DIAGNOSIS — Z6828 Body mass index (BMI) 28.0-28.9, adult: Secondary | ICD-10-CM | POA: Diagnosis not present

## 2018-06-27 DIAGNOSIS — I429 Cardiomyopathy, unspecified: Secondary | ICD-10-CM | POA: Diagnosis not present

## 2018-06-27 DIAGNOSIS — I480 Paroxysmal atrial fibrillation: Secondary | ICD-10-CM | POA: Diagnosis not present

## 2018-06-27 DIAGNOSIS — Z8249 Family history of ischemic heart disease and other diseases of the circulatory system: Secondary | ICD-10-CM | POA: Diagnosis not present

## 2018-06-27 DIAGNOSIS — Z79899 Other long term (current) drug therapy: Secondary | ICD-10-CM | POA: Diagnosis not present

## 2018-06-27 DIAGNOSIS — E785 Hyperlipidemia, unspecified: Secondary | ICD-10-CM | POA: Diagnosis not present

## 2018-06-27 DIAGNOSIS — I10 Essential (primary) hypertension: Secondary | ICD-10-CM | POA: Insufficient documentation

## 2018-06-27 DIAGNOSIS — Z880 Allergy status to penicillin: Secondary | ICD-10-CM | POA: Diagnosis not present

## 2018-06-27 DIAGNOSIS — E669 Obesity, unspecified: Secondary | ICD-10-CM | POA: Insufficient documentation

## 2018-06-27 DIAGNOSIS — I4819 Other persistent atrial fibrillation: Secondary | ICD-10-CM | POA: Insufficient documentation

## 2018-06-27 DIAGNOSIS — Z7901 Long term (current) use of anticoagulants: Secondary | ICD-10-CM | POA: Insufficient documentation

## 2018-06-27 DIAGNOSIS — Z87891 Personal history of nicotine dependence: Secondary | ICD-10-CM | POA: Diagnosis not present

## 2018-06-27 DIAGNOSIS — I119 Hypertensive heart disease without heart failure: Secondary | ICD-10-CM | POA: Diagnosis not present

## 2018-06-27 HISTORY — PX: CARDIOVERSION: SHX1299

## 2018-06-27 SURGERY — CARDIOVERSION
Anesthesia: General

## 2018-06-27 MED ORDER — LIDOCAINE 2% (20 MG/ML) 5 ML SYRINGE
INTRAMUSCULAR | Status: DC | PRN
  Administered 2018-06-27: 40 mg via INTRAVENOUS

## 2018-06-27 MED ORDER — SODIUM CHLORIDE 0.9 % IV SOLN
INTRAVENOUS | Status: AC | PRN
  Administered 2018-06-27: 500 mL via INTRAVENOUS

## 2018-06-27 MED ORDER — PROPOFOL 10 MG/ML IV BOLUS
INTRAVENOUS | Status: DC | PRN
  Administered 2018-06-27: 60 mg via INTRAVENOUS

## 2018-06-27 NOTE — Telephone Encounter (Signed)
Paged by answering service to call patient in regards to persistently elevated heart rate.   He is s/p DCCV earlier today (post-procedure HR documented as 85) but has has sustained tachycardia at 110 since earlier this evening. Per phone documentation, Dr. Harrell Gave advised patient to take diltiazem and fleclainide as scheduled and monitor symptoms.   He calls now stating that HR remains at 110 despite this efforts. States it feels very regular. His most recent BP was 130s/80s per his home monitor. He denies dizziness, lightheadedness, or chest pain. He is not short of breath.   Recommended ensuring well hydrated given he is post-procedure. Counseled him to call back tonight should be develop symptoms associated with the tachycardia. If he remains asymptomatic with normal BP, ok to await follow up plans from EP / Afib clinic tomorrow AM.   Patient in agreement.   Gary Loewen K. [name], MD

## 2018-06-27 NOTE — Discharge Instructions (Signed)
Electrical Cardioversion, Care After °This sheet gives you information about how to care for yourself after your procedure. Your health care provider may also give you more specific instructions. If you have problems or questions, contact your health care provider. °What can I expect after the procedure? °After the procedure, it is common to have: °· Some redness on the skin where the shocks were given. °Follow these instructions at home: ° °· Do not drive for 24 hours if you were given a medicine to help you relax (sedative). °· Take over-the-counter and prescription medicines only as told by your health care provider. °· Ask your health care provider how to check your pulse. Check it often. °· Rest for 48 hours after the procedure or as told by your health care provider. °· Avoid or limit your caffeine use as told by your health care provider. °Contact a health care provider if: °· You feel like your heart is beating too quickly or your pulse is not regular. °· You have a serious muscle cramp that does not go away. °Get help right away if: ° °· You have discomfort in your chest. °· You are dizzy or you feel faint. °· You have trouble breathing or you are short of breath. °· Your speech is slurred. °· You have trouble moving an arm or leg on one side of your body. °· Your fingers or toes turn cold or blue. °This information is not intended to replace advice given to you by your health care provider. Make sure you discuss any questions you have with your health care provider. °Document Released: 11/01/2012 Document Revised: 08/15/2015 Document Reviewed: 07/18/2015 °Elsevier Interactive Patient Education © 2019 [employer] ° °

## 2018-06-27 NOTE — Anesthesia Preprocedure Evaluation (Signed)
Anesthesia Evaluation  Patient identified by MRN, date of birth, ID band Patient awake    Reviewed: Allergy & Precautions, NPO status , Patient's Chart, lab work & pertinent test results  Airway Mallampati: I  TM Distance: >3 FB Neck ROM: Full    Dental  (+) Teeth Intact, Dental Advisory Given   Pulmonary former smoker,    breath sounds clear to auscultation       Cardiovascular hypertension, + dysrhythmias Atrial Fibrillation  Rhythm:Irregular Rate:Normal     Neuro/Psych negative neurological ROS  negative psych ROS   GI/Hepatic Neg liver ROS, GERD  Medicated,  Endo/Other  negative endocrine ROS  Renal/GU negative Renal ROS     Musculoskeletal negative musculoskeletal ROS (+)   Abdominal Normal abdominal exam  (+)   Peds  Hematology negative hematology ROS (+)   Anesthesia Other Findings   Reproductive/Obstetrics                             Anesthesia Physical Anesthesia Plan  ASA: III  Anesthesia Plan: General   Post-op Pain Management:    Induction: Intravenous  PONV Risk Score and Plan: Propofol infusion  Airway Management Planned: Natural Airway and Mask  Additional Equipment: None  Intra-op Plan:   Post-operative Plan:   Informed Consent: I have reviewed the patients History and Physical, chart, labs and discussed the procedure including the risks, benefits and alternatives for the proposed anesthesia with the patient or authorized representative who has indicated his/her understanding and acceptance.       Plan Discussed with: CRNA  Anesthesia Plan Comments:         Anesthesia Quick Evaluation

## 2018-06-27 NOTE — Anesthesia Postprocedure Evaluation (Signed)
Anesthesia Post Note  Patient: Gary [name]  Procedure(s) Performed: CARDIOVERSION (N/A )     Patient location during evaluation: PACU Anesthesia Type: General Level of consciousness: awake and alert Pain management: pain level controlled Vital Signs Assessment: post-procedure vital signs reviewed and stable Respiratory status: spontaneous breathing, nonlabored ventilation, respiratory function stable and patient connected to nasal cannula oxygen Cardiovascular status: blood pressure returned to baseline and stable Postop Assessment: no apparent nausea or vomiting Anesthetic complications: no    Last Vitals:  Vitals:   06/27/18 1159 06/27/18 1209  BP: 112/82 109/70  Pulse: 85 85  Resp: 14 10  Temp:    SpO2: 95% 95%    Last Pain:  Vitals:   06/27/18 1209  TempSrc:   PainSc: 0-No pain                 Effie Berkshire

## 2018-06-27 NOTE — Telephone Encounter (Signed)
The patient states he was discharged from the hospital today and was told his DCCV didn't work. He is taking his medications as directed. When he got home, he noticed his HR was 111. His BP is 106/78. He states his pulse feels regular, but it is going really fast. He is also fatigued but he thinks that could be from the anesthesia.  Per procedure notes, "Procedure:   DCCV  Indication:  Symptomatic atrial fibrillation  Procedure Note:  The patient signed informed consent.  He has had had therapeutic anticoagulation with apixaban greater than 3 weeks.  Anesthesia was administered by Dr. Smith Robert.  Adequate airway was maintained throughout and vital followed per protocol.  He was cardioverted x 2 with 150 J then 200 J of biphasic synchronized energy.  He converted to NSR.  He did have frequent PACs but remained in predominantly sinus rhythm post procedure. There were no apparent complications.  The patient had normal neuro status and respiratory status post procedure with vitals stable as recorded elsewhere.    Follow up:  He will continue on current medical therapy.    Buford Dresser, MD PhD 06/27/2018 11:52 AM"   Instructed the patient to go ahead and take his night time dose of flecainide. He will monitor symptoms overnight and will call if he does not improve.   He understands the AFib Clinic will call tomorrow to discuss further.

## 2018-06-27 NOTE — Anesthesia Procedure Notes (Signed)
Date/Time: 06/27/2018 11:41 AM Performed by: Trinna Post., CRNA Pre-anesthesia Checklist: Patient identified, Emergency Drugs available, Suction available, Patient being monitored and Timeout performed Patient Re-evaluated:Patient Re-evaluated prior to induction Oxygen Delivery Method: Ambu bag Preoxygenation: Pre-oxygenation with 100% oxygen Induction Type: IV induction Placement Confirmation: positive ETCO2

## 2018-06-27 NOTE — CV Procedure (Signed)
Procedure:   DCCV  Indication:  Symptomatic atrial fibrillation  Procedure Note:  The patient signed informed consent.  He has had had therapeutic anticoagulation with apixaban greater than 3 weeks.  Anesthesia was administered by Dr. Smith Robert.  Adequate airway was maintained throughout and vital followed per protocol.  He was cardioverted x 2 with 150 J then 200 J of biphasic synchronized energy.  He converted to NSR.  He did have frequent PACs but remained in predominantly sinus rhythm post procedure. There were no apparent complications.  The patient had normal neuro status and respiratory status post procedure with vitals stable as recorded elsewhere.    Follow up:  He will continue on current medical therapy.    Buford Dresser, MD PhD 06/27/2018 11:52 AM

## 2018-06-27 NOTE — Telephone Encounter (Signed)
° ° °  Patient had cardioversion today, states he is still out of rhythm. Calling to report HR  STAT if HR is under 50 or over 120 (normal HR is 60-100 beats per minute)  1) What is your heart rate? 111  2) Do you have a log of your heart rate readings (document readings)? N/A  3) Do you have any other symptoms? lightheaded

## 2018-06-27 NOTE — Transfer of Care (Signed)
Immediate Anesthesia Transfer of Care Note  Patient: Gary [name]  Procedure(s) Performed: CARDIOVERSION (N/A )  Patient Location: PACU and Endoscopy Unit  Anesthesia Type:General  Level of Consciousness: awake and drowsy  Airway & Oxygen Therapy: Patient Spontanous Breathing  Post-op Assessment: Report given to RN and Post -op Vital signs reviewed and stable  Post vital signs: Reviewed and stable  Last Vitals:  Vitals Value Taken Time  BP    Temp    Pulse    Resp    SpO2      Last Pain:  Vitals:   06/27/18 1125  TempSrc: Temporal  PainSc: 0-No pain         Complications: No apparent anesthesia complications

## 2018-06-27 NOTE — Interval H&P Note (Signed)
History and Physical Interval Note:  06/27/2018 11:33 AM  Gary [name]  has presented today for surgery, with the diagnosis of AFIB.  The various methods of treatment have been discussed with the patient and family. After consideration of risks, benefits and other options for treatment, the patient has consented to  Procedure(s): CARDIOVERSION (N/A) as a surgical intervention.  The patient's history has been reviewed, patient examined, no change in status, stable for surgery.  I have reviewed the patient's chart and labs.  Questions were answered to the patient's satisfaction.     Vibhav Waddill Harrell Gave

## 2018-06-28 ENCOUNTER — Ambulatory Visit (HOSPITAL_COMMUNITY)
Admission: RE | Admit: 2018-06-28 | Discharge: 2018-06-28 | Disposition: A | Discharge status: Home / Self Care | Payer: PPO | Source: Ambulatory Visit | Attending: Nurse Practitioner | Admitting: Nurse Practitioner

## 2018-06-28 ENCOUNTER — Encounter (HOSPITAL_COMMUNITY): Payer: Self-pay | Admitting: Nurse Practitioner

## 2018-06-28 ENCOUNTER — Other Ambulatory Visit: Payer: Self-pay

## 2018-06-28 VITALS — BP 100/67 | HR 88 | Ht 73.0 in

## 2018-06-28 DIAGNOSIS — I4819 Other persistent atrial fibrillation: Secondary | ICD-10-CM

## 2018-06-28 NOTE — Progress Notes (Addendum)
Electrophysiology TeleHealth Note   Due to national recommendations of social distancing due to [place] 19, Audio/video telehealth visit is felt to be most appropriate for this patient at this time.  See MyChart message/consent below from today for patient consent regarding telehealth for the [facility].    Date:  06/28/2018   ID:  Gary [name], DOB [birth year], MRN [id number]  Location: home  Provider location: [address] [place], [place] [address] Evaluation Performed:  Follow up ERAF following DCCV  PCP:  [name], MD  Primary Cardiologist:  Irish Lack Primary Electrophysiologist:  Allred   CC:    History of Present Illness: Gary [name] is a 68 y.o. male who presents via audio/video conferencing for a telehealth visit today.     [name] had an afib ablation with protracted pneumonia following the procedure. Because of this, he continued to have issues with afib. Then because of covid restrictions his cardioversion was delayed until yesterday. It was not successful. He is calling today for options. He had issues with rate control yesterday but is better rate controlled today. He is bothered with fatigue in afib.   Today, he denies symptoms of palpitations, chest pain, shortness of breath, orthopnea, PND, lower extremity edema, claudication, dizziness, presyncope, syncope, bleeding, or neurologic sequela. The patient is tolerating medications without difficulties and is otherwise without complaint today. + for fatigue.  he denies symptoms of cough, fevers, chills, or new SOB worrisome for COVID 19.     he has a BMI of There is no height or weight on file to calculate BMI.. There were no vitals filed for this visit.  Past Medical History:  Diagnosis Date  . Atrial flutter ([place]) 2007   s/p CTI ablation by Dr Lovena Le  . Back pain   . Hyperlipidemia   . Hypertension   . Insomnia   . NICM (nonischemic cardiomyopathy) ([place])    a. 2005 - tachy  mediated. normalized after return of NSR.  [name] Obesity   . Paroxysmal atrial fibrillation [facility])    Past Surgical History:  Procedure Laterality Date  . ATRIAL FIBRILLATION ABLATION N/A 03/22/2018   Procedure: ATRIAL FIBRILLATION ABLATION;  Surgeon: Thompson Grayer, MD;  Location: [place] [facility];  Service: Cardiovascular;  Laterality: N/A;  . atrial flutter ablation  06/10/2005   CTI ablation by Dr Lovena Le for atrial flutter  . CARDIOVERSION N/A 07/20/2016   Procedure: CARDIOVERSION;  Surgeon: Minus Breeding, MD;  Location: [facility] ENDOSCOPY;  Service: Cardiovascular;  Laterality: N/A;  . CARDIOVERSION N/A 12/13/2017   Procedure: CARDIOVERSION;  Surgeon: Thayer Headings, MD;  Location: [facility] ENDOSCOPY;  Service: Cardiovascular;  Laterality: N/A;  . CARDIOVERSION N/A 01/30/2018   Procedure: CARDIOVERSION;  Surgeon: Fay Records, MD;  Location: [facility];  Service: Cardiovascular;  Laterality: N/A;  . CARDIOVERSION N/A 06/27/2018   Procedure: CARDIOVERSION;  Surgeon: Buford Dresser, MD;  Location: [facility] ENDOSCOPY;  Service: Cardiovascular;  Laterality: N/A;  . NO PAST SURGERIES       Current Outpatient Medications  Medication Sig Dispense Refill  . acetaminophen (TYLENOL) 500 MG tablet Take 1,000 mg by mouth daily as needed (nerve pain).     [name] amitriptyline (ELAVIL) 50 MG tablet Take 50-75 mg by mouth at bedtime.     [name] atorvastatin (LIPITOR) 10 MG tablet Take 10 mg by mouth every evening.     . calcium carbonate (TUMS - DOSED IN MG ELEMENTAL CALCIUM) 500 MG chewable tablet Chew 2 tablets by mouth  daily as needed for indigestion or heartburn.    . diltiazem (CARDIZEM CD) 180 MG 24 hr capsule Take 1 capsule (180 mg total) by mouth daily. (Patient taking differently: Take 180 mg by mouth at bedtime. ) 90 capsule 1  . ELIQUIS 5 MG TABS tablet TAKE 1 TABLET BY MOUTH TWICE DAILY (Patient taking differently: Take 5 mg by mouth 2 (two) times daily. ) 180 tablet 3  . famotidine (PEPCID) 20 MG tablet  Take 20 mg by mouth daily as needed for heartburn or indigestion.    . flecainide (TAMBOCOR) 100 MG tablet TAKE 1 TABLET(100 MG) BY MOUTH TWICE DAILY. Please make appt with Dr. Irish Lack for October for future refills. 1st attempt (Patient taking differently: Take 100 mg by mouth 2 (two) times daily. ) 180 tablet 1  . metoprolol succinate (TOPROL-XL) 25 MG 24 hr tablet Take 1 tablet (25 mg total) by mouth daily. 90 tablet 1  . Multiple Vitamin (MULTIVITAMIN WITH MINERALS) TABS tablet Take 1 tablet by mouth daily.    . Omega-3 Fatty Acids (FISH OIL) 1200 MG CAPS Take 1,200 mg by mouth 2 (two) times daily.     . Simethicone (GAS-X PO) Take 2 tablets by mouth daily as needed (gas).    . valACYclovir (VALTREX) 1000 MG tablet Take 2,000 mg by mouth every 12 (twelve) hours as needed (FOR COLD SORES). Max 2 doses    . vitamin C (ASCORBIC ACID) 500 MG tablet Take 500 mg by mouth daily as needed (immune support).    . vitamin E (VITAMIN E) 400 UNIT capsule Take 400 Units by mouth daily.    [name] zolpidem (AMBIEN) 10 MG tablet Take 10 mg by mouth at bedtime as needed for sleep.     No current facility-administered medications for this encounter.     Allergies:   Penicillins   Social History:  The patient  reports that he has quit smoking. He has never used smokeless tobacco. He reports current alcohol use. He reports that he does not use drugs.   Family History:  The patient's  family history includes Heart attack in his father; Hypertension in his father and mother.    ROS:  Please see the history of present illness.   All other systems are personally reviewed and negative.   Exam: Well appearing, alert and conversant, regular work of breathing,  good skin color  Recent Labs: 11/22/2017: Magnesium 2.4; TSH 3.090 06/23/2018: BUN 10; Creatinine, Ser 1.10; Hemoglobin 15.4; Platelets 150; Potassium 4.2; Sodium 138  personally reviewed    Other studies personally reviewed: Additional studies/ records  that were reviewed today include: Epic records     ASSESSMENT AND PLAN:  1. Persistent  atrial fibrillation S/p ablation afib  08/1189 complicated by protracted pneumonia and now failed DCCV H/o atrial flutter ablation Discussed  with pt options to restore SR.  I discussed primarily repeat ablation and tkosyn Also discussed amiodarone but would not like to carry this long term for the potential side effects with hs current age We also discussed rate control but he would like to have more energy, "tired a lot." He will check on the price of the dofetilide He is also considering repeat afib ablaton I told hm to stop flecainide as it seems he has failed it He will watch HR to make sure he stays rate controlled BP today 117/77 and HR upper 70's to low 80's. Continue eliqius 5 mg bid Continue  current dose of CCB/BB for now  This patients CHA2DS2-VASc Score and unadjusted Ischemic Stroke Rate (% per year) is equal to 2.2 % stroke rate/year from a score of 2  Above score calculated as 1 point each if present [CHF, HTN, DM, Vascular=MI/PAD/Aortic Plaque, Age if 65-74, or Male] Above score calculated as 2 points each if present [Age > 75, or Stroke/TIA/TE]   COVID screen The patient does not have any symptoms that suggest any further testing/ screening at this time.  Social distancing reinforced today.    Follow-up:  He will  think about his options over the next couple of weeks and let me know  Current medicines are reviewed at length with the patient today.   The patient does not have concerns regarding his medicines.  The following changes were made today:  Stopped flecainide   Labs/ tests ordered today include: none No orders of the defined types were placed in this encounter.   Patient Risk:  after full review of this patients clinical status, I feel that they are at moderate risk at this time.   Today, I have spent 25 minutes with the patient with telehealth technology  discussing above   Signed, Roderic Palau NP  06/28/2018 12:13 PM  Afib [facility] [place], [place] [address] [phone]   I hereby voluntarily request, consent and authorize the [facility] and its employed or contracted physicians, physician assistants, nurse practitioners or other licensed health care professionals (the Practitioner), to provide me with telemedicine health care services (the "Services") as deemed necessary by the treating Practitioner. I acknowledge and consent to receive the Services by the Practitioner via telemedicine. I understand that the telemedicine visit will involve communicating with the Practitioner through live audiovisual communication technology and the disclosure of certain medical information by electronic transmission. I acknowledge that I have been given the opportunity to request an in-person assessment or other available alternative prior to the telemedicine visit and am voluntarily participating in the telemedicine visit.   I understand that I have the right to withhold or withdraw my consent to the use of telemedicine in the course of my care at any time, without affecting my right to future care or treatment, and that the Practitioner or I may terminate the telemedicine visit at any time. I understand that I have the right to inspect all information obtained and/or recorded in the course of the telemedicine visit and may receive copies of available information for a reasonable fee.  I understand that some of the potential risks of receiving the Services via telemedicine include:   Delay or interruption in medical evaluation due to technological equipment failure or disruption;  Information transmitted may not be sufficient (e.g. poor resolution of images) to allow for appropriate medical decision making by the Practitioner; and/or  In rare instances, security protocols could fail, causing a breach of personal  health information.   Furthermore, I acknowledge that it is my responsibility to provide information about my medical history, conditions and care that is complete and accurate to the best of my ability. I acknowledge that Practitioner's advice, recommendations, and/or decision may be based on factors not within their control, such as incomplete or inaccurate data provided by me or distortions of diagnostic images or specimens that may result from electronic transmissions. I understand that the practice of medicine is not an exact science and that Practitioner makes no warranties or guarantees regarding treatment outcomes. I acknowledge that I will receive a copy of this consent concurrently upon  execution via email to the email address I last provided but may also request a printed copy by calling the office of the [facility].  I understand that my insurance will be billed for this visit.   I have read or had this consent read to me.  I understand the contents of this consent, which adequately explains the benefits and risks of the Services being provided via telemedicine.  I have been provided ample opportunity to ask questions regarding this consent and the Services and have had my questions answered to my satisfaction.  I give my informed consent for the services to be provided through the use of telemedicine in my medical care  By participating in this telemedicine visit I agree to the above.

## 2018-06-28 NOTE — Telephone Encounter (Signed)
Pt states his HR is more controlled this morning - BP 94/62 HR 82 still feels to be in AF. Visit moved up to discuss afib options with recent failed dccv.

## 2018-07-12 ENCOUNTER — Ambulatory Visit (HOSPITAL_COMMUNITY): Payer: PPO | Admitting: Nurse Practitioner

## 2018-07-20 DIAGNOSIS — D751 Secondary polycythemia: Secondary | ICD-10-CM | POA: Diagnosis not present

## 2018-07-20 DIAGNOSIS — E782 Mixed hyperlipidemia: Secondary | ICD-10-CM | POA: Diagnosis not present

## 2018-07-20 DIAGNOSIS — I119 Hypertensive heart disease without heart failure: Secondary | ICD-10-CM | POA: Diagnosis not present

## 2018-07-20 DIAGNOSIS — R7301 Impaired fasting glucose: Secondary | ICD-10-CM | POA: Diagnosis not present

## 2018-07-24 ENCOUNTER — Telehealth: Payer: Self-pay | Admitting: Internal Medicine

## 2018-07-24 NOTE — Telephone Encounter (Signed)
New message     [facility] for pt to return call and schedule virtual appt with [name]. Will offer pt appt on 07.01.20. If pt returns call, please reach out via secure chat or call direct line at [phone]. I will speak with pt.

## 2018-07-25 ENCOUNTER — Telehealth: Payer: Self-pay

## 2018-07-25 DIAGNOSIS — D751 Secondary polycythemia: Secondary | ICD-10-CM | POA: Diagnosis not present

## 2018-07-25 DIAGNOSIS — E782 Mixed hyperlipidemia: Secondary | ICD-10-CM | POA: Diagnosis not present

## 2018-07-25 DIAGNOSIS — R7301 Impaired fasting glucose: Secondary | ICD-10-CM | POA: Diagnosis not present

## 2018-07-25 DIAGNOSIS — I429 Cardiomyopathy, unspecified: Secondary | ICD-10-CM | POA: Diagnosis not present

## 2018-07-25 DIAGNOSIS — G4700 Insomnia, unspecified: Secondary | ICD-10-CM | POA: Diagnosis not present

## 2018-07-25 DIAGNOSIS — N529 Male erectile dysfunction, unspecified: Secondary | ICD-10-CM | POA: Diagnosis not present

## 2018-07-25 DIAGNOSIS — I119 Hypertensive heart disease without heart failure: Secondary | ICD-10-CM | POA: Diagnosis not present

## 2018-07-25 DIAGNOSIS — Z7189 Other specified counseling: Secondary | ICD-10-CM | POA: Diagnosis not present

## 2018-07-25 DIAGNOSIS — I4891 Unspecified atrial fibrillation: Secondary | ICD-10-CM | POA: Diagnosis not present

## 2018-07-25 NOTE — Telephone Encounter (Signed)
Spoke with pt regarding appt on 07/26/18. Pt stated he di not have any questions at this time. Pt was advise to check vitals prior to appt

## 2018-07-26 ENCOUNTER — Telehealth (INDEPENDENT_AMBULATORY_CARE_PROVIDER_SITE_OTHER): Payer: PPO | Admitting: Internal Medicine

## 2018-07-26 ENCOUNTER — Encounter: Payer: Self-pay | Admitting: Internal Medicine

## 2018-07-26 ENCOUNTER — Telehealth: Payer: Self-pay | Admitting: Pharmacist

## 2018-07-26 VITALS — BP 110/79 | HR 94 | Ht 73.0 in | Wt 210.0 lb

## 2018-07-26 DIAGNOSIS — I4819 Other persistent atrial fibrillation: Secondary | ICD-10-CM

## 2018-07-26 DIAGNOSIS — I10 Essential (primary) hypertension: Secondary | ICD-10-CM

## 2018-07-26 NOTE — Progress Notes (Signed)
Electrophysiology TeleHealth Note   Due to national recommendations of social distancing due to COVID 19, an audio/video telehealth visit is felt to be most appropriate for this patient at this time.  See MyChart message from today for the patient's consent to telehealth for [facility].   Date:  07/26/2018   ID:  [name], DOB 14-Jan-1951, MRN [id number]  Location: patient's home  Provider location:  [facility]  Evaluation Performed: Follow-up visit  PCP:  [name], MD   Electrophysiologist:  Dr Rayann Heman  Chief Complaint:  palpitations  History of Present Illness:    Gary [name] is a 68 y.o. male who presents via telehealth conferencing today.  Since last being seen in our clinic, the patient reports doing reasonably well.  He continues to have afib post ablation.  + fatigue,  Decreased exercise tolerance.  + SOB.  His SOB and exertional chest pain have been present since at least October.   Today, he denies symptoms of palpitations, chest pain,  lower extremity edema, dizziness, presyncope, or syncope.  The patient is otherwise without complaint today.  The patient denies symptoms of fevers, chills, cough, or new SOB worrisome for COVID 19.  Past Medical History:  Diagnosis Date  . Atrial flutter ([place]) 2007   s/p CTI ablation by Dr Lovena Le  . Back pain   . Hyperlipidemia   . Hypertension   . Insomnia   . NICM (nonischemic cardiomyopathy) ([place])    a. 2005 - tachy mediated. normalized after return of NSR.  [name] Obesity   . Paroxysmal atrial fibrillation [facility])     Past Surgical History:  Procedure Laterality Date  . ATRIAL FIBRILLATION ABLATION N/A 03/22/2018   Procedure: ATRIAL FIBRILLATION ABLATION;  Surgeon: Thompson Grayer, MD;  Location: [place] CV LAB;  Service: Cardiovascular;  Laterality: N/A;  . atrial flutter ablation  06/10/2005   CTI ablation by Dr Lovena Le for atrial flutter  . CARDIOVERSION N/A 07/20/2016   Procedure: CARDIOVERSION;  Surgeon:  Minus Breeding, MD;  Location: [facility] ENDOSCOPY;  Service: Cardiovascular;  Laterality: N/A;  . CARDIOVERSION N/A 12/13/2017   Procedure: CARDIOVERSION;  Surgeon: Thayer Headings, MD;  Location: [facility] ENDOSCOPY;  Service: Cardiovascular;  Laterality: N/A;  . CARDIOVERSION N/A 01/30/2018   Procedure: CARDIOVERSION;  Surgeon: Fay Records, MD;  Location: [place];  Service: Cardiovascular;  Laterality: N/A;  . CARDIOVERSION N/A 06/27/2018   Procedure: CARDIOVERSION;  Surgeon: Buford Dresser, MD;  Location: [facility] ENDOSCOPY;  Service: Cardiovascular;  Laterality: N/A;  . NO PAST SURGERIES      Current Outpatient Medications  Medication Sig Dispense Refill  . acetaminophen (TYLENOL) 500 MG tablet Take 1,000 mg by mouth daily as needed (nerve pain).     [name] amitriptyline (ELAVIL) 50 MG tablet Take 50-75 mg by mouth at bedtime.     [name] atorvastatin (LIPITOR) 10 MG tablet Take 10 mg by mouth every evening.     . calcium carbonate (TUMS - DOSED IN MG ELEMENTAL CALCIUM) 500 MG chewable tablet Chew 2 tablets by mouth daily as needed for indigestion or heartburn.    . diltiazem (CARDIZEM CD) 180 MG 24 hr capsule Take 1 capsule (180 mg total) by mouth daily. 90 capsule 1  . ELIQUIS 5 MG TABS tablet TAKE 1 TABLET BY MOUTH TWICE DAILY 180 tablet 3  . famotidine (PEPCID) 20 MG tablet Take 20 mg by mouth daily as needed for heartburn or indigestion.    . metoprolol succinate (TOPROL-XL) 25 MG  24 hr tablet Take 1 tablet (25 mg total) by mouth daily. 90 tablet 1  . Multiple Vitamin (MULTIVITAMIN WITH MINERALS) TABS tablet Take 1 tablet by mouth daily.    . Omega-3 Fatty Acids (FISH OIL) 1200 MG CAPS Take 1,200 mg by mouth 2 (two) times daily.     . Simethicone (GAS-X PO) Take 2 tablets by mouth daily as needed (gas).    . valACYclovir (VALTREX) 1000 MG tablet Take 2,000 mg by mouth every 12 (twelve) hours as needed (FOR COLD SORES). Max 2 doses    . vitamin C (ASCORBIC ACID) 500 MG tablet Take 500 mg by mouth  daily as needed (immune support).    . vitamin E (VITAMIN E) 400 UNIT capsule Take 400 Units by mouth daily.    [name] zolpidem (AMBIEN) 10 MG tablet Take 10 mg by mouth at bedtime as needed for sleep.     No current facility-administered medications for this visit.     Allergies:   Penicillins   Social History:  The patient  reports that he has quit smoking. He has never used smokeless tobacco. He reports current alcohol use. He reports that he does not use drugs.   Family History:  The patient's family history includes Heart attack in his father; Hypertension in his father and mother.   ROS:  Please see the history of present illness.   All other systems are personally reviewed and negative.    Exam:    Vital Signs:  BP 110/79   Pulse 94   Ht 6\' 1"  (1.854 m)   Wt 210 lb (95.3 kg)   BMI 27.71 kg/m   Well appearing today, NAD, OP clear, normal WOB   Labs/Other Tests and Data Reviewed:    Recent Labs: 11/22/2017: Magnesium 2.4; TSH 3.090 06/23/2018: BUN 10; Creatinine, Ser 1.10; Hemoglobin 15.4; Platelets 150; Potassium 4.2; Sodium 138   Wt Readings from Last 3 Encounters:  07/26/18 210 lb (95.3 kg)  06/23/18 216 lb (98 kg)  04/13/18 226 lb (102.5 kg)      ASSESSMENT & PLAN:    1.  Persistent afib Continues to have afib.  He has failed medical therapy with flecainide S/p PVI (02/2018) and prior CTI ablations  Chads2vasc score is 2.  he is anticoagulated with eliquis. Therapeutic strategies for afib including medicine (tikosyn, amiodarone) and ablation were discussed in detail with the patient today. Risk, benefits, and alternatives to each approach was discussed today.  He and I agree that Phyllis Ginger is a good option. I will therefore arrange tikosyn initiation at the next available time.  He will continue eliquis without interruption in the interim.  He missed a dose of eliquis last Thursday morning.  2. HTN Stable No change required today  3. ETOH Avoidance is  encouraged  4. Overweight  Lifestyle modification is encouraged   Follow-up:  Will plan tikosyn load after 3 weeks of eliquis.  Will need covid testing prior.   Patient Risk:  after full review of this patients clinical status, I feel that they are at highrisk at this time.  Today, I have spent 25 minutes with the patient with telehealth technology discussing arrhythmia management .   Very complicated patient.  A high level of decision making was required for this encounter.  Army Fossa, MD  07/26/2018 9:54 AM     [place] [address] [place] [address] [phone] (office) [phone] (fax)

## 2018-07-26 NOTE — Telephone Encounter (Signed)
Medication list reviewed in anticipation of upcoming Tikosyn initiation. Patient is taking amitriptyline which is QTc prolonging but not contraindicated with Tikosyn. Will need to monitor QTc closely.  Patient is anticoagulated on Eliquis 5mg  BID on the appropriate dose. Pt has reported missing a dose on 6/25 - will need 3 weeks of uninterrupted anticoagulation before starting Tikosyn. Earliest admission date would be 7/16.  Patient will need to be counseled to avoid use of Benadryl while on Tikosyn and in the 2-3 days prior to Tikosyn initiation.

## 2018-07-27 ENCOUNTER — Other Ambulatory Visit (HOSPITAL_COMMUNITY): Payer: Self-pay | Admitting: *Deleted

## 2018-07-27 ENCOUNTER — Encounter (HOSPITAL_COMMUNITY): Payer: Self-pay

## 2018-08-07 ENCOUNTER — Other Ambulatory Visit (HOSPITAL_COMMUNITY): Payer: Self-pay | Admitting: *Deleted

## 2018-08-08 DIAGNOSIS — M5440 Lumbago with sciatica, unspecified side: Secondary | ICD-10-CM | POA: Diagnosis not present

## 2018-08-08 DIAGNOSIS — I10 Essential (primary) hypertension: Secondary | ICD-10-CM | POA: Diagnosis not present

## 2018-08-08 DIAGNOSIS — Z6828 Body mass index (BMI) 28.0-28.9, adult: Secondary | ICD-10-CM | POA: Diagnosis not present

## 2018-08-10 ENCOUNTER — Other Ambulatory Visit (HOSPITAL_COMMUNITY)
Admission: RE | Admit: 2018-08-10 | Discharge: 2018-08-10 | Disposition: A | Discharge status: Home / Self Care | Payer: PPO | Source: Ambulatory Visit | Attending: Internal Medicine | Admitting: Internal Medicine

## 2018-08-10 DIAGNOSIS — Z1159 Encounter for screening for other viral diseases: Secondary | ICD-10-CM | POA: Diagnosis not present

## 2018-08-10 LAB — SARS CORONAVIRUS 2 (TAT 6-24 HRS): SARS Coronavirus 2: NEGATIVE

## 2018-08-14 ENCOUNTER — Inpatient Hospital Stay (HOSPITAL_COMMUNITY)
Admission: RE | Admit: 2018-08-14 | Discharge: 2018-08-17 | DRG: 310 | Disposition: A | Discharge status: Home / Self Care | Payer: PPO | Attending: Internal Medicine | Admitting: Internal Medicine

## 2018-08-14 ENCOUNTER — Other Ambulatory Visit: Payer: Self-pay

## 2018-08-14 ENCOUNTER — Ambulatory Visit (HOSPITAL_COMMUNITY)
Admission: RE | Admit: 2018-08-14 | Discharge: 2018-08-14 | Disposition: A | Discharge status: Home / Self Care | Payer: PPO | Source: Ambulatory Visit | Attending: Nurse Practitioner | Admitting: Nurse Practitioner

## 2018-08-14 ENCOUNTER — Encounter (HOSPITAL_COMMUNITY): Payer: Self-pay | Admitting: *Deleted

## 2018-08-14 ENCOUNTER — Encounter (HOSPITAL_COMMUNITY): Payer: Self-pay | Admitting: Nurse Practitioner

## 2018-08-14 VITALS — BP 132/74 | HR 104 | Ht 73.0 in | Wt 214.0 lb

## 2018-08-14 DIAGNOSIS — I4892 Unspecified atrial flutter: Secondary | ICD-10-CM | POA: Diagnosis not present

## 2018-08-14 DIAGNOSIS — I4819 Other persistent atrial fibrillation: Secondary | ICD-10-CM | POA: Diagnosis not present

## 2018-08-14 DIAGNOSIS — E669 Obesity, unspecified: Secondary | ICD-10-CM | POA: Diagnosis present

## 2018-08-14 DIAGNOSIS — G4700 Insomnia, unspecified: Secondary | ICD-10-CM | POA: Diagnosis not present

## 2018-08-14 DIAGNOSIS — I10 Essential (primary) hypertension: Secondary | ICD-10-CM | POA: Diagnosis present

## 2018-08-14 DIAGNOSIS — I4891 Unspecified atrial fibrillation: Secondary | ICD-10-CM | POA: Diagnosis present

## 2018-08-14 DIAGNOSIS — Z880 Allergy status to penicillin: Secondary | ICD-10-CM | POA: Diagnosis not present

## 2018-08-14 DIAGNOSIS — E785 Hyperlipidemia, unspecified: Secondary | ICD-10-CM | POA: Diagnosis present

## 2018-08-14 DIAGNOSIS — Z8249 Family history of ischemic heart disease and other diseases of the circulatory system: Secondary | ICD-10-CM | POA: Diagnosis not present

## 2018-08-14 DIAGNOSIS — Z79899 Other long term (current) drug therapy: Secondary | ICD-10-CM | POA: Diagnosis not present

## 2018-08-14 DIAGNOSIS — Z6828 Body mass index (BMI) 28.0-28.9, adult: Secondary | ICD-10-CM

## 2018-08-14 DIAGNOSIS — Z7901 Long term (current) use of anticoagulants: Secondary | ICD-10-CM | POA: Diagnosis not present

## 2018-08-14 DIAGNOSIS — I428 Other cardiomyopathies: Secondary | ICD-10-CM | POA: Diagnosis not present

## 2018-08-14 DIAGNOSIS — Z87891 Personal history of nicotine dependence: Secondary | ICD-10-CM

## 2018-08-14 LAB — BASIC METABOLIC PANEL
Anion gap: 10 (ref 5–15)
BUN: 15 mg/dL (ref 8–23)
CO2: 25 mmol/L (ref 22–32)
Calcium: 9.1 mg/dL (ref 8.9–10.3)
Chloride: 104 mmol/L (ref 98–111)
Creatinine, Ser: 1.14 mg/dL (ref 0.61–1.24)
GFR calc Af Amer: >60 mL/min (ref >60)
GFR calc non Af Amer: >60 mL/min (ref >60)
Glucose, Bld: 109 mg/dL — ABNORMAL HIGH (ref 70–99)
Potassium: 4 mmol/L (ref 3.5–5.1)
Sodium: 139 mmol/L (ref 135–145)

## 2018-08-14 LAB — MAGNESIUM: Magnesium: 2.3 mg/dL (ref 1.7–2.4)

## 2018-08-14 MED ORDER — VITAMIN E 180 MG (400 UNIT) PO CAPS
400.0000 [IU] | ORAL_CAPSULE | Freq: Every day | ORAL | Status: DC
  Administered 2018-08-15 – 2018-08-17 (×3): 400 [IU] via ORAL
  Filled 2018-08-14 (×4): qty 1

## 2018-08-14 MED ORDER — ZOLPIDEM TARTRATE 5 MG PO TABS: 5.0000 mg | ORAL_TABLET | Freq: Every evening | ORAL | Status: DC | PRN

## 2018-08-14 MED ORDER — APIXABAN 5 MG PO TABS
5.0000 mg | ORAL_TABLET | Freq: Two times a day (BID) | ORAL | Status: DC
  Administered 2018-08-14 – 2018-08-17 (×6): 5 mg via ORAL
  Filled 2018-08-14 (×6): qty 1

## 2018-08-14 MED ORDER — SODIUM CHLORIDE 0.9% FLUSH: 3.0000 mL | INTRAVENOUS | Status: DC | PRN

## 2018-08-14 MED ORDER — METOPROLOL SUCCINATE ER 25 MG PO TB24
25.0000 mg | ORAL_TABLET | Freq: Every day | ORAL | Status: DC
  Administered 2018-08-15 – 2018-08-17 (×3): 25 mg via ORAL
  Filled 2018-08-14 (×3): qty 1

## 2018-08-14 MED ORDER — SODIUM CHLORIDE 0.9% FLUSH
3.0000 mL | Freq: Two times a day (BID) | INTRAVENOUS | Status: DC
  Administered 2018-08-14 – 2018-08-17 (×2): 3 mL via INTRAVENOUS

## 2018-08-14 MED ORDER — FISH OIL 1200 MG PO CAPS: 1200.0000 mg | ORAL_CAPSULE | Freq: Two times a day (BID) | ORAL | Status: DC

## 2018-08-14 MED ORDER — ATORVASTATIN CALCIUM 10 MG PO TABS
10.0000 mg | ORAL_TABLET | Freq: Every evening | ORAL | Status: DC
  Administered 2018-08-14 – 2018-08-16 (×3): 10 mg via ORAL
  Filled 2018-08-14 (×3): qty 1

## 2018-08-14 MED ORDER — DOFETILIDE 500 MCG PO CAPS
500.0000 ug | ORAL_CAPSULE | Freq: Two times a day (BID) | ORAL | Status: DC
  Administered 2018-08-14 – 2018-08-17 (×6): 500 ug via ORAL
  Filled 2018-08-14 (×6): qty 1

## 2018-08-14 MED ORDER — DILTIAZEM HCL ER COATED BEADS 180 MG PO CP24
180.0000 mg | ORAL_CAPSULE | Freq: Every day | ORAL | Status: DC
  Administered 2018-08-14 – 2018-08-16 (×3): 180 mg via ORAL
  Filled 2018-08-14 (×3): qty 1

## 2018-08-14 MED ORDER — DILTIAZEM HCL ER COATED BEADS 180 MG PO CP24
180.0000 mg | ORAL_CAPSULE | Freq: Every day | ORAL | Status: DC
  Filled 2018-08-14: qty 1

## 2018-08-14 MED ORDER — OMEGA-3-ACID ETHYL ESTERS 1 G PO CAPS
1.0000 g | ORAL_CAPSULE | Freq: Two times a day (BID) | ORAL | Status: DC
  Administered 2018-08-14 – 2018-08-17 (×5): 1 g via ORAL
  Filled 2018-08-14 (×6): qty 1

## 2018-08-14 MED ORDER — AMITRIPTYLINE HCL 25 MG PO TABS
50.0000 mg | ORAL_TABLET | Freq: Every day | ORAL | Status: DC
  Administered 2018-08-14 – 2018-08-16 (×3): 50 mg via ORAL
  Filled 2018-08-14 (×3): qty 2

## 2018-08-14 MED ORDER — SODIUM CHLORIDE 0.9 % IV SOLN: 250.0000 mL | INTRAVENOUS | Status: DC | PRN

## 2018-08-14 NOTE — Care Management (Signed)
Per [facility] 500 mcg. Not on formulary.  Co-pay amount for Generic brand of TIKOSYN 500 mcg. Bid ,is DOFETILIDE 500 mcg bid $127.96 60 for 30 day supply. Mail order 90 day supply $291.83. Co pay amount for Eliquis 5 mg,bid $103.61 60  for a 30 day supply,Mail order 90 day supply $359.00. ([facility] mail order only) teir 4 and teir 3 for medications No PA required No deductible Pharmacies in [place] any pharmacy she want to use except for mail order will have to use [facility].

## 2018-08-14 NOTE — H&P (Signed)
Date:  08/14/2018   ID:  Gary [name], DOB [birth year], MRN [id number]     PCP:  [name], MD   Primary Cardiologist:  Irish Lack Primary Electrophysiologist:  Allred   CC: persistent  afib   History of Present Illness: Gary [name] is a 68 y.o. male who presents for dofetilide admission today.  Gary [name] had an afib ablation 03/22/18 with protracted pneumonia following the procedure. Because of this, he continued to have issues with afib. Then because of covid restrictions his cardioversion was delayed until 06/27/18. He returned to afib and had appointment with [name] 7/1.[name] It was decided to pursue tikosyn to restore SR. He has tested covid negative.   Today, he denies symptoms of palpitations, chest pain, shortness of breath, orthopnea, PND, lower extremity edema, claudication, dizziness, presyncope, syncope, bleeding, or neurologic sequela. The patient is tolerating medications without difficulties and is otherwise without complaint today. + for fatigue.  he denies symptoms of cough, fevers, chills, or new SOB worrisome for COVID 19.     he has a BMI of Body mass index is 28.23 kg/m.[name]    Filed Weights   08/14/18 1044  Weight: 97.1 kg        Past Medical History:  Diagnosis Date  . Atrial flutter ([place]) 2007   s/p CTI ablation by Dr Lovena Le  . Back pain   . Hyperlipidemia   . Hypertension   . Insomnia   . NICM (nonischemic cardiomyopathy) ([place])    a. 2005 - tachy mediated. normalized after return of NSR.  [name] Obesity   . Paroxysmal atrial fibrillation [facility])         Past Surgical History:  Procedure Laterality Date  . ATRIAL FIBRILLATION ABLATION N/A 03/22/2018   Procedure: ATRIAL FIBRILLATION ABLATION;  Surgeon: Thompson Grayer, MD;  Location: [place] CV LAB;  Service: Cardiovascular;  Laterality: N/A;  . atrial flutter ablation  06/10/2005   CTI ablation by Dr Lovena Le for atrial flutter  . CARDIOVERSION N/A 07/20/2016   Procedure:  CARDIOVERSION;  Surgeon: Minus Breeding, MD;  Location: [facility] ENDOSCOPY;  Service: Cardiovascular;  Laterality: N/A;  . CARDIOVERSION N/A 12/13/2017   Procedure: CARDIOVERSION;  Surgeon: Thayer Headings, MD;  Location: [facility] ENDOSCOPY;  Service: Cardiovascular;  Laterality: N/A;  . CARDIOVERSION N/A 01/30/2018   Procedure: CARDIOVERSION;  Surgeon: Fay Records, MD;  Location: [place];  Service: Cardiovascular;  Laterality: N/A;  . CARDIOVERSION N/A 06/27/2018   Procedure: CARDIOVERSION;  Surgeon: Buford Dresser, MD;  Location: [facility] ENDOSCOPY;  Service: Cardiovascular;  Laterality: N/A;  . NO PAST SURGERIES             Current Outpatient Medications  Medication Sig Dispense Refill  . acetaminophen (TYLENOL) 500 MG tablet Take 1,000 mg by mouth daily as needed (nerve pain).     [name] amitriptyline (ELAVIL) 50 MG tablet Take 50-75 mg by mouth at bedtime.     [name] atorvastatin (LIPITOR) 10 MG tablet Take 10 mg by mouth every evening.     . calcium carbonate (TUMS - DOSED IN MG ELEMENTAL CALCIUM) 500 MG chewable tablet Chew 2 tablets by mouth daily as needed for indigestion or heartburn.    . diltiazem (CARDIZEM CD) 180 MG 24 hr capsule Take 1 capsule (180 mg total) by mouth daily. 90 capsule 1  . ELIQUIS 5 MG TABS tablet TAKE 1 TABLET BY MOUTH TWICE DAILY 180 tablet 3  . famotidine (PEPCID) 20 MG tablet Take 20 mg by mouth daily as  needed for heartburn or indigestion.    . metoprolol succinate (TOPROL-XL) 25 MG 24 hr tablet Take 1 tablet (25 mg total) by mouth daily. 90 tablet 1  . Multiple Vitamin (MULTIVITAMIN WITH MINERALS) TABS tablet Take 1 tablet by mouth daily.    . Omega-3 Fatty Acids (FISH OIL) 1200 MG CAPS Take 1,200 mg by mouth 2 (two) times daily.     . sildenafil (VIAGRA) 50 MG tablet Take 50 mg by mouth daily as needed for erectile dysfunction.    . Simethicone (GAS-X PO) Take 2 tablets by mouth daily as needed (gas).    . valACYclovir (VALTREX) 1000 MG  tablet Take 2,000 mg by mouth every 12 (twelve) hours as needed (FOR COLD SORES). Max 2 doses    . vitamin C (ASCORBIC ACID) 500 MG tablet Take 500 mg by mouth daily as needed (immune support).    . vitamin E (VITAMIN E) 400 UNIT capsule Take 400 Units by mouth daily.    [name] zolpidem (AMBIEN) 10 MG tablet Take 10 mg by mouth at bedtime as needed for sleep.     No current facility-administered medications for this encounter.     Allergies:   Penicillins   Social History:  The patient  reports that he has quit smoking. He has never used smokeless tobacco. He reports current alcohol use. He reports that he does not use drugs.   Family History:  The patient's  family history includes Heart attack in his father; Hypertension in his father and mother.    ROS:  Please see the history of present illness.   All other systems are personally reviewed and negative.   Exam: GEN- The patient is well appearing, alert and oriented x 3 today.  Head- normocephalic, atraumatic Eyes- Sclera clear, conjunctiva pink Ears- hearing intact Oropharynx- clear Neck- supple, no JVP Lymph- no cervical lymphadenopathy Lungs- Clear to ausculation bilaterally, normal work of breathing Heart- irregular rate and rhythm, no murmurs, rubs or gallops, PMI not laterally displaced GI- soft, NT, ND, + BS Extremities- no clubbing, cyanosis, or edema MS- no significant deformity or atrophy Skin- no rash or lesion Psych- euthymic mood, full affect Neuro- strength and sensation are intact  Recent Labs: 11/22/2017: Magnesium 2.4; TSH 3.090 06/23/2018: BUN 10; Creatinine, Ser 1.10; Hemoglobin 15.4; Platelets 150; Potassium 4.2; Sodium 138  personally reviewed    Other studies personally reviewed: Additional studies/ records that were reviewed today include: Epic records EKG-afib at 104 bpm, qrs int 88 ms, qtc  420 ma    ASSESSMENT AND PLAN:  1. Persistent  atrial fibrillation S/p ablation afib   01/9415 complicated by protracted pneumonia and now failed DCCV Failed flecainide  H/o atrial flutter ablation as well  he is being admitted for tikosyn Tikosyn precautions reviewed He can afford drug He can afford  dofetilide Took benadryl but over 72 hours ago Is on amitriptyline, per M. Supple, PharmD, is qtc prolonging but not contraindicated Continue eliqius 5 mg bid, states no missed doses for the last 3 weeks This patients CHA2DS2-VASc Score and unadjusted Ischemic Stroke Rate (% per year) is equal to 2.2 % stroke rate/year from a score of 2 Cmet/mag pending, but based on prior creatinie, he should be good to start on 500 mcb bid with a crcl cal at 87 ml/min COVID screen negative    Current medicines are reviewed at length with the patient today.   The patient does not have concerns regarding his medicines.  The following changes were made  today:  Stopped flecainide   Labs/ tests ordered today include: none    Orders Placed This Encounter  Procedures  . Basic Metabolic Panel (BMET)  . Magnesium    Patient Risk:  after full review of this patients clinical status, I feel that they are at moderate risk at this time.   Today, I have spent 25 minutes with the patient with telehealth technology discussing above   Signed, Roderic Palau NP  08/14/2018 11:13 AM  Af[facility] [address] [place], [place] [address] [phone]  EP Attending  Agree with above. The patient will be admitted for initiation of dofetilide. I have reviewe his ECG. We will follow QT interval and electrolytes carefully.   Mikle Bosworth.D.

## 2018-08-14 NOTE — Progress Notes (Signed)
Date:  08/14/2018   ID:  Gary [name], DOB 09/11/1950, MRN [id number]     PCP:  [name], MD  Primary Cardiologist:  Irish Lack Primary Electrophysiologist:  Allred   CC: persistent  afib   History of Present Illness: Gary [name] is a 68 y.o. male who presents for dofetilide admission today.  Gary [name] had an afib ablation 03/22/18 with protracted pneumonia following the procedure. Because of this, he continued to have issues with afib. Then because of covid restrictions his cardioversion was delayed until 06/27/18. He returned to afib and had appointment with [name] 7/1.[name] It was decided to pursue tikosyn to restore SR. He has tested covid negative.   Today, he denies symptoms of palpitations, chest pain, shortness of breath, orthopnea, PND, lower extremity edema, claudication, dizziness, presyncope, syncope, bleeding, or neurologic sequela. The patient is tolerating medications without difficulties and is otherwise without complaint today. + for fatigue.  he denies symptoms of cough, fevers, chills, or new SOB worrisome for COVID 19.     he has a BMI of Body mass index is 28.23 kg/m.[name] Filed Weights   08/14/18 1044  Weight: 97.1 kg    Past Medical History:  Diagnosis Date  . Atrial flutter ([place]) 2007   s/p CTI ablation by Dr Lovena Le  . Back pain   . Hyperlipidemia   . Hypertension   . Insomnia   . NICM (nonischemic cardiomyopathy) ([place])    a. 2005 - tachy mediated. normalized after return of NSR.  [name] Obesity   . Paroxysmal atrial fibrillation [facility])    Past Surgical History:  Procedure Laterality Date  . ATRIAL FIBRILLATION ABLATION N/A 03/22/2018   Procedure: ATRIAL FIBRILLATION ABLATION;  Surgeon: Thompson Grayer, MD;  Location: [place] CV LAB;  Service: Cardiovascular;  Laterality: N/A;  . atrial flutter ablation  06/10/2005   CTI ablation by Dr Lovena Le for atrial flutter  . CARDIOVERSION N/A 07/20/2016   Procedure: CARDIOVERSION;  Surgeon: Minus Breeding, MD;  Location: [facility] ENDOSCOPY;  Service: Cardiovascular;  Laterality: N/A;  . CARDIOVERSION N/A 12/13/2017   Procedure: CARDIOVERSION;  Surgeon: Thayer Headings, MD;  Location: [facility] ENDOSCOPY;  Service: Cardiovascular;  Laterality: N/A;  . CARDIOVERSION N/A 01/30/2018   Procedure: CARDIOVERSION;  Surgeon: Fay Records, MD;  Location: [place];  Service: Cardiovascular;  Laterality: N/A;  . CARDIOVERSION N/A 06/27/2018   Procedure: CARDIOVERSION;  Surgeon: Buford Dresser, MD;  Location: [facility] ENDOSCOPY;  Service: Cardiovascular;  Laterality: N/A;  . NO PAST SURGERIES       Current Outpatient Medications  Medication Sig Dispense Refill  . acetaminophen (TYLENOL) 500 MG tablet Take 1,000 mg by mouth daily as needed (nerve pain).     [name] amitriptyline (ELAVIL) 50 MG tablet Take 50-75 mg by mouth at bedtime.     [name] atorvastatin (LIPITOR) 10 MG tablet Take 10 mg by mouth every evening.     . calcium carbonate (TUMS - DOSED IN MG ELEMENTAL CALCIUM) 500 MG chewable tablet Chew 2 tablets by mouth daily as needed for indigestion or heartburn.    . diltiazem (CARDIZEM CD) 180 MG 24 hr capsule Take 1 capsule (180 mg total) by mouth daily. 90 capsule 1  . ELIQUIS 5 MG TABS tablet TAKE 1 TABLET BY MOUTH TWICE DAILY 180 tablet 3  . famotidine (PEPCID) 20 MG tablet Take 20 mg by mouth daily as needed for heartburn or indigestion.    . metoprolol succinate (TOPROL-XL) 25 MG 24  hr tablet Take 1 tablet (25 mg total) by mouth daily. 90 tablet 1  . Multiple Vitamin (MULTIVITAMIN WITH MINERALS) TABS tablet Take 1 tablet by mouth daily.    . Omega-3 Fatty Acids (FISH OIL) 1200 MG CAPS Take 1,200 mg by mouth 2 (two) times daily.     . sildenafil (VIAGRA) 50 MG tablet Take 50 mg by mouth daily as needed for erectile dysfunction.    . Simethicone (GAS-X PO) Take 2 tablets by mouth daily as needed (gas).    . valACYclovir (VALTREX) 1000 MG tablet Take 2,000 mg by mouth every 12 (twelve) hours as needed (FOR  COLD SORES). Max 2 doses    . vitamin C (ASCORBIC ACID) 500 MG tablet Take 500 mg by mouth daily as needed (immune support).    . vitamin E (VITAMIN E) 400 UNIT capsule Take 400 Units by mouth daily.    [name] zolpidem (AMBIEN) 10 MG tablet Take 10 mg by mouth at bedtime as needed for sleep.     No current facility-administered medications for this encounter.     Allergies:   Penicillins   Social History:  The patient  reports that he has quit smoking. He has never used smokeless tobacco. He reports current alcohol use. He reports that he does not use drugs.   Family History:  The patient's  family history includes Heart attack in his father; Hypertension in his father and mother.    ROS:  Please see the history of present illness.   All other systems are personally reviewed and negative.   Exam: GEN- The patient is well appearing, alert and oriented x 3 today.   Head- normocephalic, atraumatic Eyes-  Sclera clear, conjunctiva pink Ears- hearing intact Oropharynx- clear Neck- supple, no JVP Lymph- no cervical lymphadenopathy Lungs- Clear to ausculation bilaterally, normal work of breathing Heart- irregular rate and rhythm, no murmurs, rubs or gallops, PMI not laterally displaced GI- soft, NT, ND, + BS Extremities- no clubbing, cyanosis, or edema MS- no significant deformity or atrophy Skin- no rash or lesion Psych- euthymic mood, full affect Neuro- strength and sensation are intact  Recent Labs: 11/22/2017: Magnesium 2.4; TSH 3.090 06/23/2018: BUN 10; Creatinine, Ser 1.10; Hemoglobin 15.4; Platelets 150; Potassium 4.2; Sodium 138  personally reviewed    Other studies personally reviewed: Additional studies/ records that were reviewed today include: Epic records EKG-afib at 104 bpm, qrs int 88 ms, qtc  420 ma    ASSESSMENT AND PLAN:  1. Persistent  atrial fibrillation S/p ablation afib  03/7900 complicated by protracted pneumonia and now failed DCCV Failed flecainide  H/o  atrial flutter ablation as well  he is being admitted for tikosyn Tikosyn precautions reviewed He can afford drug He can afford  dofetilide Took benadryl but over 72 hours ago Is on amitriptyline, per M. Supple, PharmD, is qtc prolonging but not contraindicated Continue eliqius 5 mg bid, states no missed doses for the last 3 weeks This patients CHA2DS2-VASc Score and unadjusted Ischemic Stroke Rate (% per year) is equal to 2.2 % stroke rate/year from a score of 2 Cmet/mag pending, but based on prior creatinie, he should be good to start on 500 mcb bid with a crcl cal at 87 ml/min COVID screen negative    Current medicines are reviewed at length with the patient today.   The patient does not have concerns regarding his medicines.  The following changes were made today:  Stopped flecainide   Labs/ tests ordered today include: none Orders  Placed This Encounter  Procedures  . Basic Metabolic Panel (BMET)  . Magnesium    Patient Risk:  after full review of this patients clinical status, I feel that they are at moderate risk at this time.   Today, I have spent 25 minutes with the patient with telehealth technology discussing above   Signed, Roderic Palau NP  08/14/2018 11:13 AM  Af[facility] [address] [place], [place] [address] [phone]

## 2018-08-15 LAB — BASIC METABOLIC PANEL
Anion gap: 7 (ref 5–15)
BUN: 12 mg/dL (ref 8–23)
CO2: 25 mmol/L (ref 22–32)
Calcium: 8.9 mg/dL (ref 8.9–10.3)
Chloride: 106 mmol/L (ref 98–111)
Creatinine, Ser: 0.95 mg/dL (ref 0.61–1.24)
GFR calc Af Amer: >60 mL/min (ref >60)
GFR calc non Af Amer: >60 mL/min (ref >60)
Glucose, Bld: 93 mg/dL (ref 70–99)
Potassium: 4 mmol/L (ref 3.5–5.1)
Sodium: 138 mmol/L (ref 135–145)

## 2018-08-15 LAB — MAGNESIUM: Magnesium: 2.1 mg/dL (ref 1.7–2.4)

## 2018-08-15 LAB — HIV ANTIBODY (ROUTINE TESTING W REFLEX): HIV Screen 4th Generation wRfx: NONREACTIVE

## 2018-08-15 MED ORDER — SODIUM CHLORIDE 0.9% FLUSH: 3.0000 mL | INTRAVENOUS | Status: DC | PRN

## 2018-08-15 MED ORDER — HYDROCORTISONE 1 % EX CREA
1.0000 "application " | TOPICAL_CREAM | Freq: Three times a day (TID) | CUTANEOUS | Status: DC | PRN
  Filled 2018-08-15: qty 28

## 2018-08-15 MED ORDER — SODIUM CHLORIDE 0.9% FLUSH: 3.0000 mL | Freq: Two times a day (BID) | INTRAVENOUS | Status: DC

## 2018-08-15 MED ORDER — SODIUM CHLORIDE 0.9 % IV SOLN: 250.0000 mL | INTRAVENOUS | Status: DC

## 2018-08-15 NOTE — Plan of Care (Signed)
  Problem: Activity: Goal: Ability to tolerate increased activity will improve Outcome: Progressing   

## 2018-08-15 NOTE — Progress Notes (Addendum)
Progress Note  Patient Name: Gary [name] Date of Encounter: 08/15/2018  Primary Cardiologist: Larae Grooms, MD  Electrophysiologist: [name]  Subjective   No complaints, can tell by his pulse he is out of rhythm still, not much in the way of symptoms with his AFib at rest  Inpatient Medications    Scheduled Meds: . amitriptyline  50 mg Oral QHS  . apixaban  5 mg Oral BID  . atorvastatin  10 mg Oral QPM  . diltiazem  180 mg Oral Daily  . dofetilide  500 mcg Oral BID  . metoprolol succinate  25 mg Oral Daily  . omega-3 acid ethyl esters  1 g Oral BID  . sodium chloride flush  3 mL Intravenous Q12H  . vitamin E  400 Units Oral Daily   Continuous Infusions: . sodium chloride     PRN Meds: sodium chloride, sodium chloride flush, zolpidem   Vital Signs    Vitals:   08/14/18 1142 08/14/18 2016 08/15/18 0516  BP: 119/88 113/71 106/80  Pulse: (!) 105 96 73  Resp: 16 19 14   Temp: 98.1 F (36.7 C) (!) 97.4 F (36.3 C) 97.6 F (36.4 C)  TempSrc: Oral Oral Oral  SpO2: 99% 100% 100%  Weight: 96.6 kg  95.3 kg  Height: 6\' 1"  (1.854 m)     No intake or output data in the 24 hours ending 08/15/18 0714 Last 3 Weights 08/15/2018 08/14/2018 08/14/2018  Weight (lbs) 210 lb 1.6 oz 212 lb 14.4 oz 214 lb  Weight (kg) 95.301 kg 96.571 kg 97.07 kg      Telemetry    AFib 80's generally - Personally Reviewed  ECG    AFib 102bpm, QTc 465ms - Personally Reviewed  Physical Exam   GEN: No acute distress.   Neck: No JVD Cardiac: irreg-irreg, no murmurs, rubs, or gallops.  Respiratory: CTA b/l GI: Soft, nontender, non-distended  MS: No edema; No deformity. Neuro:  Nonfocal  Psych: Normal affect   Labs    High Sensitivity Troponin:  No results for input(s): TROPONINIHS in the last 720 hours.    Cardiac EnzymesNo results for input(s): TROPONINI in the last 168 hours. No results for input(s): TROPIPOC in the last 168 hours.   Chemistry Recent Labs  Lab 08/14/18  1103 08/15/18 0510  NA 139 138  K 4.0 4.0  CL 104 106  CO2 25 25  GLUCOSE 109* 93  BUN 15 12  CREATININE 1.14 0.95  CALCIUM 9.1 8.9  GFRNONAA >60 >60  GFRAA >60 >60  ANIONGAP 10 7     HematologyNo results for input(s): WBC, RBC, HGB, HCT, MCV, MCH, MCHC, RDW, PLT in the last 168 hours.  BNPNo results for input(s): BNP, PROBNP in the last 168 hours.   DDimer No results for input(s): DDIMER in the last 168 hours.   Radiology    No results found.  Cardiac Studies   11/24/17: TTE Study Conclusions - Left ventricle: The cavity size was at the upper limits of   normal. Systolic function was normal. The estimated ejection   fraction was in the range of 55% to 60%. Wall motion was normal;   there were no regional wall motion abnormalities. The study was   not technically sufficient to allow evaluation of LV diastolic   dysfunction due to atrial fibrillation. - Aortic valve: Poorly visualized. Trileaflet; normal thickness,   mildly calcified leaflets. - Mitral valve: Calcified annulus. There was trivial regurgitation. - Left atrium: The atrium was  mildly dilated. - Tricuspid valve: There was trivial regurgitation.  Patient Profile     68 y.o. male w/PMHx of HTN, HLD, NICM w/recovery of LVEF, AFlutter ablated 2007, and persistent AFib, admitted for Tikosyn initiation.  Assessment & Plan    1. Persistent AFib     CHA2DS2Vasc is 2 (3 with h/o CM), on Eliquis, appropriately dosed     tikosyn load is in progress     K+ 4.0     Mag 2.1     Creat 0.95 (stable)     QT stable  DCCV tomorrow if not in SR, pt is aware  3. HTN     No changes today  For questions or updates, please contact [place] Please consult [email or web address] for contact info under   Signed, Baldwin Jamaica, PA-C  08/15/2018, 7:14 AM    EP Attending  Patient seen and examined. Agree with above. The patient remains in atrial fib this morning. We will plan for DCCV tomorrow if he is still not in  NSR.  Mikle Bosworth.D.

## 2018-08-16 ENCOUNTER — Encounter (HOSPITAL_COMMUNITY)
Admission: RE | Disposition: A | Discharge status: Home / Self Care | Payer: Self-pay | Source: Ambulatory Visit | Attending: Internal Medicine

## 2018-08-16 ENCOUNTER — Inpatient Hospital Stay (HOSPITAL_COMMUNITY): Payer: PPO | Admitting: Certified Registered"

## 2018-08-16 ENCOUNTER — Encounter (HOSPITAL_COMMUNITY): Payer: Self-pay | Admitting: *Deleted

## 2018-08-16 HISTORY — PX: CARDIOVERSION: SHX1299

## 2018-08-16 LAB — BASIC METABOLIC PANEL
Anion gap: 7 (ref 5–15)
BUN: 16 mg/dL (ref 8–23)
CO2: 28 mmol/L (ref 22–32)
Calcium: 8.9 mg/dL (ref 8.9–10.3)
Chloride: 106 mmol/L (ref 98–111)
Creatinine, Ser: 0.96 mg/dL (ref 0.61–1.24)
GFR calc Af Amer: >60 mL/min (ref >60)
GFR calc non Af Amer: >60 mL/min (ref >60)
Glucose, Bld: 104 mg/dL — ABNORMAL HIGH (ref 70–99)
Potassium: 3.8 mmol/L (ref 3.5–5.1)
Sodium: 141 mmol/L (ref 135–145)

## 2018-08-16 LAB — MAGNESIUM: Magnesium: 2.4 mg/dL (ref 1.7–2.4)

## 2018-08-16 SURGERY — CARDIOVERSION
Anesthesia: General

## 2018-08-16 MED ORDER — SODIUM CHLORIDE 0.9 % IV SOLN
INTRAVENOUS | Status: DC
  Administered 2018-08-16 (×2): via INTRAVENOUS

## 2018-08-16 MED ORDER — POTASSIUM CHLORIDE CRYS ER 20 MEQ PO TBCR
40.0000 meq | EXTENDED_RELEASE_TABLET | Freq: Once | ORAL | Status: AC
  Administered 2018-08-16: 40 meq via ORAL
  Filled 2018-08-16: qty 2

## 2018-08-16 MED ORDER — PROPOFOL 10 MG/ML IV BOLUS
INTRAVENOUS | Status: DC | PRN
  Administered 2018-08-16: 70 mg via INTRAVENOUS

## 2018-08-16 MED ORDER — LIDOCAINE 2% (20 MG/ML) 5 ML SYRINGE
INTRAMUSCULAR | Status: DC | PRN
  Administered 2018-08-16: 60 mg via INTRAVENOUS

## 2018-08-16 NOTE — CV Procedure (Signed)
   CARDIOVERSION NOTE  Procedure: Electrical Cardioversion Indications:  Atrial Fibrillation  Procedure Details:  Consent: Risks of procedure as well as the alternatives and risks of each were explained to the (patient/caregiver).  Consent for procedure obtained.  Time Out: Verified patient identification, verified procedure, site/side was marked, verified correct patient position, special equipment/implants available, medications/allergies/relevent history reviewed, required imaging and test results available.  Performed  Patient placed on cardiac monitor, pulse oximetry, supplemental oxygen as necessary.  Sedation given: propofol per anesthesia Pacer pads placed anterior and posterior chest.  Cardioverted 1 time(s).  Cardioverted at 150J biphasic.  Impression: Findings: Post procedure EKG shows: NSR Complications: None Patient did tolerate procedure well.  Plan: 1. Successful DCCV to NSR with a single 150J biphasic shock.  Time Spent Directly with the Patient:  30 minutes   Gary Casino, MD, [facility], [place] Director of the Advanced Lipid Disorders &  Cardiovascular Risk Reduction Clinic Diplomate of the American Board of Clinical Lipidology Attending Cardiologist  Direct Dial: [phone]  Fax: [phone]  Website:  [email or web address]  Gary [name] 08/16/2018, 1:05 PM

## 2018-08-16 NOTE — Transfer of Care (Signed)
Immediate Anesthesia Transfer of Care Note  Patient: Gary [name]  Procedure(s) Performed: CARDIOVERSION (N/A )  Patient Location: Endoscopy Unit  Anesthesia Type:General  Level of Consciousness: awake  Airway & Oxygen Therapy: Patient Spontanous Breathing  Post-op Assessment: Report given to RN and Post -op Vital signs reviewed and stable  Post vital signs: Reviewed and stable  Last Vitals:  Vitals Value Taken Time  BP    Temp    Pulse    Resp    SpO2      Last Pain:  Vitals:   08/16/18 1224  TempSrc: Oral  PainSc: 0-No pain      Patients Stated Pain Goal: 0 (25/48/62 8241)  Complications: No apparent anesthesia complications

## 2018-08-16 NOTE — Anesthesia Preprocedure Evaluation (Addendum)
Anesthesia Evaluation  Patient identified by MRN, date of birth, ID band Patient awake    Reviewed: Allergy & Precautions, NPO status , Patient's Chart, lab work & pertinent test results  Airway Mallampati: I  TM Distance: >3 FB Neck ROM: Full    Dental no notable dental hx. (+) Teeth Intact, Dental Advisory Given   Pulmonary neg pulmonary ROS, former smoker,    Pulmonary exam normal breath sounds clear to auscultation       Cardiovascular hypertension, Pt. on home beta blockers and Pt. on medications Normal cardiovascular exam+ dysrhythmias Atrial Fibrillation  Rhythm:Irregular Rate:Normal  TTE 2019 EF 55-60%, no significant valvular abnormalities  Stress Test 2019 Blood pressure demonstrated a normal response to exercise. There was no ST segment deviation noted during stress. No T wave inversion was noted during stress. Mildly impaired exercise tolerance, possibly due to deconditioning. Otherwise, normal ECG stress test.   Neuro/Psych negative neurological ROS  negative psych ROS   GI/Hepatic Neg liver ROS, GERD  Medicated,  Endo/Other  negative endocrine ROS  Renal/GU negative Renal ROS  negative genitourinary   Musculoskeletal negative musculoskeletal ROS (+)   Abdominal   Peds  Hematology  (+) Blood dyscrasia (on eliquis), ,   Anesthesia Other Findings   Reproductive/Obstetrics                            Anesthesia Physical Anesthesia Plan  ASA: III  Anesthesia Plan: General   Post-op Pain Management:    Induction: Intravenous  PONV Risk Score and Plan: 2 and Propofol infusion and Treatment may vary due to age or medical condition  Airway Management Planned: Natural Airway and Mask  Additional Equipment:   Intra-op Plan:   Post-operative Plan:   Informed Consent: I have reviewed the patients History and Physical, chart, labs and discussed the procedure including  the risks, benefits and alternatives for the proposed anesthesia with the patient or authorized representative who has indicated his/her understanding and acceptance.     Dental advisory given  Plan Discussed with: CRNA  Anesthesia Plan Comments:         Anesthesia Quick Evaluation

## 2018-08-16 NOTE — Discharge Instructions (Addendum)
AS DISCUSSED PRIOR TO TAKING ANY NEW PRESCRIBED OR OVER THE COUNTER MEDICATION, PLEASE CHECK WITH THE PRESCRIBING DOCTOR AND YOUR PHARMACIST TO MAKE SURE IS OK TO TAKE WITH YOUR DOFETILIDE (TIKOSYN).       Information on my medicine - ELIQUIS (apixaban)  This medication education was reviewed with me or my healthcare representative as part of my discharge preparation.   Why was Eliquis prescribed for you? Eliquis was prescribed for you to reduce the risk of a blood clot forming that can cause a stroke if you have a medical condition called atrial fibrillation (a type of irregular heartbeat).  What do You need to know about Eliquis ? Take your Eliquis TWICE DAILY - one tablet in the morning and one tablet in the evening with or without food. If you have difficulty swallowing the tablet whole please discuss with your pharmacist how to take the medication safely.  Take Eliquis exactly as prescribed by your doctor and DO NOT stop taking Eliquis without talking to the doctor who prescribed the medication.  Stopping may increase your risk of developing a stroke.  Refill your prescription before you run out.  After discharge, you should have regular check-up appointments with your healthcare provider that is prescribing your Eliquis.  In the future your dose may need to be changed if your kidney function or weight changes by a significant amount or as you get older.  What do you do if you miss a dose? If you miss a dose, take it as soon as you remember on the same day and resume taking twice daily.  Do not take more than one dose of ELIQUIS at the same time to make up a missed dose.  Important Safety Information A possible side effect of Eliquis is bleeding. You should call your healthcare provider right away if you experience any of the following: ? Bleeding from an injury or your nose that does not stop. ? Unusual colored urine (red or dark brown) or unusual colored stools (red or  black). ? Unusual bruising for unknown reasons. ? A serious fall or if you hit your head (even if there is no bleeding).  Some medicines may interact with Eliquis and might increase your risk of bleeding or clotting while on Eliquis. To help avoid this, consult your healthcare provider or pharmacist prior to using any new prescription or non-prescription medications, including herbals, vitamins, non-steroidal anti-inflammatory drugs (NSAIDs) and supplements.  This website has more information on Eliquis (apixaban): http://www.eliquis.com/eliquis/home  Dofetilide capsules What is this medicine? DOFETILIDE (doe FET il ide) is an antiarrhythmic drug. It helps make your heart beat regularly. This medicine also helps to slow rapid heartbeats. This medicine may be used for other purposes; ask your health care provider or pharmacist if you have questions. COMMON BRAND NAME(S): Tikosyn What should I tell my health care provider before I take this medicine? They need to know if you have any of these conditions:  heart disease  history of irregular heartbeat  history of low levels of potassium or magnesium in the blood  kidney disease  liver disease  an unusual or allergic reaction to dofetilide, other medicines, foods, dyes, or preservatives  pregnant or trying to get pregnant  breast-feeding How should I use this medicine? Take this medicine by mouth with a glass of water. Follow the directions on the prescription label. Do not take with grapefruit juice. You can take it with or without food. If it upsets your stomach, take it with food.  Take your medicine at regular intervals. Do not take it more often than directed. Do not stop taking except on your doctor's advice. A special MedGuide will be given to you by the pharmacist with each prescription and refill. Be sure to read this information carefully each time. Talk to your pediatrician regarding the use of this medicine in children.  Special care may be needed. Overdosage: If you think you have taken too much of this medicine contact a poison control center or emergency room at once. NOTE: This medicine is only for you. Do not share this medicine with others. What if I miss a dose? If you miss a dose, skip it. Take your next dose at the normal time. Do not take extra or 2 doses at the same time to make up for the missed dose. What may interact with this medicine? Do not take this medicine with any of the following medications:  cimetidine  cisapride  dolutegravir  dronedarone  hydrochlorothiazide  ketoconazole  megestrol  pimozide  prochlorperazine  thioridazine  trimethoprim  verapamil This medicine may also interact with the following medications:  amiloride  cannabinoids  certain antibiotics like erythromycin or clarithromycin  certain antiviral medicines for HIV or hepatitis  certain medicines for depression, anxiety, or psychotic disorders  digoxin  diltiazem  grapefruit juice  metformin  nefazodone  other medicines that prolong the QT interval (an abnormal heart rhythm)  quinine  triamterene  zafirlukast  ziprasidone This list may not describe all possible interactions. Give your health care provider a list of all the medicines, herbs, non-prescription drugs, or dietary supplements you use. Also tell them if you smoke, drink alcohol, or use illegal drugs. Some items may interact with your medicine. What should I watch for while using this medicine? Your condition will be monitored carefully while you are receiving this medicine. What side effects may I notice from receiving this medicine? Side effects that you should report to your doctor or health care professional as soon as possible:  allergic reactions like skin rash, itching or hives, swelling of the face, lips, or tongue  breathing problems  chest pain or chest tightness  dizziness  signs and symptoms of a  dangerous change in heartbeat or heart rhythm like chest pain; dizziness; fast or irregular heartbeat; palpitations; feeling faint or lightheaded, falls; breathing problems  signs and symptoms of electrolyte imbalance like severe diarrhea, unusual sweating, vomiting, loss of appetite, increased thirst  swelling of the ankles, legs, or feet  tingling, numbness in the hands or feet Side effects that usually do not require medical attention (report to your doctor or health care professional if they continue or are bothersome):  diarrhea  general ill feeling or flu-like symptoms  headache  nausea  trouble sleeping  stomach pain This list may not describe all possible side effects. Call your doctor for medical advice about side effects. You may report side effects to FDA at 1-800-FDA-1088. Where should I keep my medicine? Keep out of the reach of children. Store at room temperature between 15 and 30 degrees C (59 and 86 degrees F). Throw away any unused medicine after the expiration date. NOTE: This sheet is a summary. It may not cover all possible information. If you have questions about this medicine, talk to your doctor, pharmacist, or health care provider.  2020 Elsevier/Gold Standard (2018-01-02 10:18:48)    Heart-Healthy Eating Plan Heart-healthy meal planning includes:  Eating less unhealthy fats.  Eating more healthy fats.  Making other changes  in your diet. Talk with your doctor or a diet specialist (dietitian) to create an eating plan that is right for you. What is my plan? Your doctor may recommend an eating plan that includes:  Total fat: ______% or less of total calories a day.  Saturated fat: ______% or less of total calories a day.  Cholesterol: less than _________mg a day. What are tips for following this plan? Cooking Avoid frying your food. Try to bake, boil, grill, or broil it instead. You can also reduce fat by:  Removing the skin from  poultry.  Removing all visible fats from meats.  Steaming vegetables in water or broth. Meal planning   At meals, divide your plate into four equal parts: ? Fill one-half of your plate with vegetables and green salads. ? Fill one-fourth of your plate with whole grains. ? Fill one-fourth of your plate with lean protein foods.  Eat 4-5 servings of vegetables per day. A serving of vegetables is: ? 1 cup of raw or cooked vegetables. ? 2 cups of raw leafy greens.  Eat 4-5 servings of fruit per day. A serving of fruit is: ? 1 medium whole fruit. ?  cup of dried fruit. ?  cup of fresh, frozen, or canned fruit. ?  cup of 100% fruit juice.  Eat more foods that have soluble fiber. These are apples, broccoli, carrots, beans, peas, and barley. Try to get 20-30 g of fiber per day.  Eat 4-5 servings of nuts, legumes, and seeds per week: ? 1 serving of dried beans or legumes equals  cup after being cooked. ? 1 serving of nuts is  cup. ? 1 serving of seeds equals 1 tablespoon. General information  Eat more home-cooked food. Eat less restaurant, buffet, and fast food.  Limit or avoid alcohol.  Limit foods that are high in starch and sugar.  Avoid fried foods.  Lose weight if you are overweight.  Keep track of how much salt (sodium) you eat. This is important if you have high blood pressure. Ask your doctor to tell you more about this.  Try to add vegetarian meals each week. Fats  Choose healthy fats. These include olive oil and canola oil, flaxseeds, walnuts, almonds, and seeds.  Eat more omega-3 fats. These include salmon, mackerel, sardines, tuna, flaxseed oil, and ground flaxseeds. Try to eat fish at least 2 times each week.  Check food labels. Avoid foods with trans fats or high amounts of saturated fat.  Limit saturated fats. ? These are often found in animal products, such as meats, butter, and cream. ? These are also found in plant foods, such as palm oil, palm kernel  oil, and coconut oil.  Avoid foods with partially hydrogenated oils in them. These have trans fats. Examples are stick margarine, some tub margarines, cookies, crackers, and other baked goods. What foods can I eat? Fruits All fresh, canned (in natural juice), or frozen fruits. Vegetables Fresh or frozen vegetables (raw, steamed, roasted, or grilled). Green salads. Grains Most grains. Choose whole wheat and whole grains most of the time. Rice and pasta, including brown rice and pastas made with whole wheat. Meats and other proteins Lean, well-trimmed beef, veal, pork, and lamb. Chicken and [place] without skin. All fish and shellfish. Wild duck, rabbit, pheasant, and venison. Egg whites or low-cholesterol egg substitutes. Dried beans, peas, lentils, and tofu. Seeds and most nuts. Dairy Low-fat or nonfat cheeses, including ricotta and mozzarella. Skim or 1% milk that is liquid, powdered, or evaporated.  Buttermilk that is made with low-fat milk. Nonfat or low-fat yogurt. Fats and oils Non-hydrogenated (trans-free) margarines. Vegetable oils, including soybean, sesame, sunflower, olive, peanut, safflower, corn, canola, and cottonseed. Salad dressings or mayonnaise made with a vegetable oil. Beverages Mineral water. Coffee and tea. Diet carbonated beverages. Sweets and desserts Sherbet, gelatin, and fruit ice. Small amounts of dark chocolate. Limit all sweets and desserts. Seasonings and condiments All seasonings and condiments. The items listed above may not be a complete list of foods and drinks you can eat. Contact a dietitian for more options. What foods should I avoid? Fruits Canned fruit in heavy syrup. Fruit in cream or butter sauce. Fried fruit. Limit coconut. Vegetables Vegetables cooked in cheese, cream, or butter sauce. Fried vegetables. Grains Breads that are made with saturated or trans fats, oils, or whole milk. Croissants. Sweet rolls. Donuts. High-fat crackers, such as cheese  crackers. Meats and other proteins Fatty meats, such as hot dogs, ribs, sausage, bacon, rib-eye roast or steak. High-fat deli meats, such as salami and bologna. Caviar. Domestic duck and goose. Organ meats, such as liver. Dairy Cream, sour cream, cream cheese, and creamed cottage cheese. Whole-milk cheeses. Whole or 2% milk that is liquid, evaporated, or condensed. Whole buttermilk. Cream sauce or high-fat cheese sauce. Yogurt that is made from whole milk. Fats and oils Meat fat, or shortening. Cocoa butter, hydrogenated oils, palm oil, coconut oil, palm kernel oil. Solid fats and shortenings, including bacon fat, salt pork, lard, and butter. Nondairy cream substitutes. Salad dressings with cheese or sour cream. Beverages Regular sodas and juice drinks with added sugar. Sweets and desserts Frosting. Pudding. Cookies. Cakes. Pies. Milk chocolate or white chocolate. Buttered syrups. Full-fat ice cream or ice cream drinks. The items listed above may not be a complete list of foods and drinks to avoid. Contact a dietitian for more information. Summary  Heart-healthy meal planning includes eating less unhealthy fats, eating more healthy fats, and making other changes in your diet.  Eat a balanced diet. This includes fruits and vegetables, low-fat or nonfat dairy, lean protein, nuts and legumes, whole grains, and heart-healthy oils and fats. This information is not intended to replace advice given to you by your health care provider. Make sure you discuss any questions you have with your health care provider. Document Released: 07/13/2011 Document Revised: 03/17/2017 Document Reviewed: 02/18/2017 Elsevier Patient Education  2020 [employer].

## 2018-08-16 NOTE — H&P (Signed)
   INTERVAL PROCEDURE H&P  History and Physical Interval Note:  08/16/2018 11:17 AM  Gary [name] has presented today for their planned procedure. The various methods of treatment have been discussed with the patient and family. After consideration of risks, benefits and other options for treatment, the patient has consented to the procedure.  The patients' outpatient history has been reviewed, patient examined, and no change in status from most recent office note within the past 30 days. I have reviewed the patients' chart and labs and will proceed as planned. Questions were answered to the patient's satisfaction.   [name], MD, [facility], [place] Director of the [facility]  [facility] Diplomate of the American Board of Clinical Lipidology Attending Cardiologist  Direct Dial: [phone]  Fax: [phone]  Website:  [name] 08/16/2018, 11:17 AM

## 2018-08-16 NOTE — Progress Notes (Addendum)
Progress Note  Patient Name: Gary [name] Date of Encounter: 08/16/2018  Primary Cardiologist: Larae Grooms, MD  Electrophysiologist: [name]  Subjective   No complaints, tolerating drug.  Inpatient Medications    Scheduled Meds: . amitriptyline  50 mg Oral QHS  . apixaban  5 mg Oral BID  . atorvastatin  10 mg Oral QPM  . diltiazem  180 mg Oral Daily  . dofetilide  500 mcg Oral BID  . metoprolol succinate  25 mg Oral Daily  . omega-3 acid ethyl esters  1 g Oral BID  . potassium chloride  40 mEq Oral Once  . sodium chloride flush  3 mL Intravenous Q12H  . sodium chloride flush  3 mL Intravenous Q12H  . vitamin E  400 Units Oral Daily   Continuous Infusions: . sodium chloride    . sodium chloride     PRN Meds: sodium chloride, hydrocortisone cream, sodium chloride flush, sodium chloride flush, zolpidem   Vital Signs    Vitals:   08/15/18 0516 08/15/18 1300 08/15/18 2327 08/16/18 0409  BP: 106/80 112/78 102/80 101/79  Pulse: 73 71 92 79  Resp: 14 15  16   Temp: 97.6 F (36.4 C) 97.8 F (36.6 C) 98.1 F (36.7 C) 98.1 F (36.7 C)  TempSrc: Oral Oral Oral Oral  SpO2: 100% 100% 100% 99%  Weight: 95.3 kg   95.8 kg  Height:        Intake/Output Summary (Last 24 hours) at 08/16/2018 0717 Last data filed at 08/15/2018 2000 Gross per 24 hour  Intake 1524 ml  Output -  Net 1524 ml   Last 3 Weights 08/16/2018 08/15/2018 08/14/2018  Weight (lbs) 211 lb 1.6 oz 210 lb 1.6 oz 212 lb 14.4 oz  Weight (kg) 95.754 kg 95.301 kg 96.571 kg      Telemetry    AFib 70's-90's generally - Personally Reviewed  ECG    AFib 103bpm, QTc in AF is difficult to establish, reviewed with Dr. Lovena Le, QT appears stable - Personally Reviewed  Physical Exam   No change in exam GEN: No acute distress.   Neck: No JVD Cardiac: irreg-irreg, no murmurs, rubs, or gallops.  Respiratory: CTA b/l GI: Soft, nontender, non-distended  MS: No edema; No deformity. Neuro:  Nonfocal   Psych: Normal affect   Labs    High Sensitivity Troponin:  No results for input(s): TROPONINIHS in the last 720 hours.    Cardiac EnzymesNo results for input(s): TROPONINI in the last 168 hours. No results for input(s): TROPIPOC in the last 168 hours.   Chemistry Recent Labs  Lab 08/14/18 1103 08/15/18 0510 08/16/18 0401  NA 139 138 141  K 4.0 4.0 3.8  CL 104 106 106  CO2 25 25 28   GLUCOSE 109* 93 104*  BUN 15 12 16   CREATININE 1.14 0.95 0.96  CALCIUM 9.1 8.9 8.9  GFRNONAA >60 >60 >60  GFRAA >60 >60 >60  ANIONGAP 10 7 7      HematologyNo results for input(s): WBC, RBC, HGB, HCT, MCV, MCH, MCHC, RDW, PLT in the last 168 hours.  BNPNo results for input(s): BNP, PROBNP in the last 168 hours.   DDimer No results for input(s): DDIMER in the last 168 hours.   Radiology    No results found.  Cardiac Studies   11/24/17: TTE Study Conclusions - Left ventricle: The cavity size was at the upper limits of   normal. Systolic function was normal. The estimated ejection   fraction was in  the range of 55% to 60%. Wall motion was normal;   there were no regional wall motion abnormalities. The study was   not technically sufficient to allow evaluation of LV diastolic   dysfunction due to atrial fibrillation. - Aortic valve: Poorly visualized. Trileaflet; normal thickness,   mildly calcified leaflets. - Mitral valve: Calcified annulus. There was trivial regurgitation. - Left atrium: The atrium was mildly dilated. - Tricuspid valve: There was trivial regurgitation.  Patient Profile     68 y.o. male w/PMHx of HTN, HLD, NICM w/recovery of LVEF, AFlutter ablated 2007, and persistent AFib, admitted for Tikosyn initiation.  Assessment & Plan    1. Persistent AFib     CHA2DS2Vasc is 2 (3 with h/o CM), on Eliquis, appropriately dosed     tikosyn load is in progress     K+ 3.8 (replaced)     Mag 2.4     Creat 0.96 (stable)     QT stable  DCCV today Anticipate discharge  tomorrow No PA required for insurance, appreciate CM help  3. HTN     No changes today   For questions or updates, please contact [place] Please consult [email or web address] for contact info under   Signed, Baldwin [place], PA-C  08/16/2018, 7:17 AM    EP Attending  Patient seen and examined. Agree with the findings as noted above. The patient is doing well this morning but remains in atrial fib/flutter. He will undergo DCCV later today. Home tomorrow if QT and renal function and potassium all stable.   Mikle Bosworth.D.

## 2018-08-16 NOTE — Anesthesia Procedure Notes (Signed)
Procedure Name: General with mask airway Date/Time: 08/16/2018 1:00 PM Performed by: Orlie Dakin, CRNA Pre-anesthesia Checklist: Patient identified, Emergency Drugs available, Suction available and Patient being monitored Patient Re-evaluated:Patient Re-evaluated prior to induction Oxygen Delivery Method: Ambu bag Preoxygenation: Pre-oxygenation with 100% oxygen Induction Type: IV induction

## 2018-08-17 ENCOUNTER — Encounter (HOSPITAL_COMMUNITY): Payer: Self-pay | Admitting: Internal Medicine

## 2018-08-17 LAB — BASIC METABOLIC PANEL
Anion gap: 7 (ref 5–15)
BUN: 18 mg/dL (ref 8–23)
CO2: 26 mmol/L (ref 22–32)
Calcium: 8.7 mg/dL — ABNORMAL LOW (ref 8.9–10.3)
Chloride: 108 mmol/L (ref 98–111)
Creatinine, Ser: 0.8 mg/dL (ref 0.61–1.24)
GFR calc Af Amer: >60 mL/min (ref >60)
GFR calc non Af Amer: >60 mL/min (ref >60)
Glucose, Bld: 102 mg/dL — ABNORMAL HIGH (ref 70–99)
Potassium: 4 mmol/L (ref 3.5–5.1)
Sodium: 141 mmol/L (ref 135–145)

## 2018-08-17 LAB — MAGNESIUM: Magnesium: 2.2 mg/dL (ref 1.7–2.4)

## 2018-08-17 MED ORDER — DOFETILIDE 500 MCG PO CAPS: 500.0000 ug | ORAL_CAPSULE | Freq: Two times a day (BID) | ORAL | 0 refills | Status: DC

## 2018-08-17 MED ORDER — DOFETILIDE 500 MCG PO CAPS: 500.0000 ug | ORAL_CAPSULE | Freq: Two times a day (BID) | ORAL | 2 refills | Status: DC

## 2018-08-17 MED ORDER — ACETAMINOPHEN 325 MG PO TABS
650.0000 mg | ORAL_TABLET | Freq: Four times a day (QID) | ORAL | Status: DC | PRN
  Administered 2018-08-17: 10:00:00 650 mg via ORAL
  Filled 2018-08-17: qty 2

## 2018-08-17 MED ORDER — DOFETILIDE 500 MCG PO CAPS: 500.0000 ug | ORAL_CAPSULE | Freq: Two times a day (BID) | ORAL | 6 refills | Status: DC

## 2018-08-17 MED FILL — TIKOSYN 500 MCG CAPS: 500 | 7 days supply | Qty: 14 | Fill #0

## 2018-08-17 NOTE — Care Management Important Message (Signed)
Important Message  Patient Details  Name: Gary [name] MRN: [id number] Date of Birth: 21-Oct-1950   Medicare Important Message Given:  Yes     [name] 08/17/2018, 1:43 PM

## 2018-08-17 NOTE — Discharge Summary (Addendum)
ELECTROPHYSIOLOGY PROCEDURE DISCHARGE SUMMARY    Patient ID: Gary [name],  MRN: [id number], DOB/AGE: Jun 15, 1950 68 y.o.  Admit date: 08/14/2018 Discharge date: 08/17/2018  Primary Care Physician: [name], MD  Primary Cardiologist: Dr.Varanasi Electrophysiologist: [name]  Primary Discharge Diagnosis:  1.  persistent atrial fibrillation status post Tikosyn loading this admission      CHA2DS2Vasc is 2 onEliquis  Secondary Discharge Diagnosis:  1. HTN 2. NICM     Recovered LVEF   Allergies  Allergen Reactions  . Penicillins Rash    DID THE REACTION INVOLVE: Swelling of the face/tongue/throat, SOB, or low BP? No Sudden or severe rash/hives, skin peeling, or the inside of the mouth or nose? Unknown Did it require medical treatment? Unknown When did it last happen?Childhood Allergy If all above answers are "NO", may proceed with cephalosporin use.       Procedures This Admission:  1.  Tikosyn loading 2.  Direct current cardioversion on 08/16/2018 by Dr Debara Pickett which successfully restored SR.  There were no early apparent complications.   Brief HPI: PRANEETH BUSSEY is a 68 y.o. male with a past medical history as noted above.  He is followed by EP in the outpatient setting for atrial fibrillation.  Risks, benefits, and alternatives to Tikosyn were reviewed with the patient who wished to proceed.    Hospital Course:  The patient was admitted and Tikosyn was initiated.  Renal function and electrolytes were followed during the hospitalization.  His QTc remained stable.  On 08/16/2018 they underwent direct current cardioversion which restored sinus rhythm.  The patient was monitored until discharge on telemetry which demonstrated AFib >> SR.  On the day of discharge the patient feels well, mentions slight/brief positional vertigo which he has had before, he was examined by Dr Lovena Le who considered the patient stable for discharge to home.  Follow-up has been  arranged with the Afib clinic in 1 week and with Dr Rayann Heman in 4 weeks.   Dr.Taylor discussed Tikosyn safety, did teaching with the patient.  He was instructed to check with his pharmacist prior to taking any new prescribed or OTC medicines to make sure was OK with his Dofetilide.   Physical Exam: Vitals:   08/16/18 2003 08/17/18 0451 08/17/18 0454 08/17/18 0958  BP: 120/72  114/73 117/76  Pulse: 91  80 73  Resp: 18  18   Temp: 97.8 F (36.6 C)  98.4 F (36.9 C)   TempSrc: Oral  Oral   SpO2: 97%  98%   Weight:  97.4 kg    Height:        GEN- The patient is well appearing, alert and oriented x 3 today.   HEENT: normocephalic, atraumatic; sclera clear, conjunctiva pink; hearing intact; oropharynx clear; neck supple, no JVP Lymph- no cervical lymphadenopathy Lungs- CTA b/l, normal work of breathing.  No wheezes, rales, rhonchi Heart- RRR, no murmurs, rubs or gallops, PMI not laterally displaced GI- soft, non-tender, non-distended, Extremities- no clubbing, cyanosis, or edema MS- no significant deformity or atrophy Skin- warm and dry, no rash or lesion Psych- euthymic mood, full affect Neuro- strength and sensation are intact   Labs:   Lab Results  Component Value Date   WBC 7.1 06/23/2018   HGB 15.4 06/23/2018   HCT 46.1 06/23/2018   MCV 95 06/23/2018   PLT 150 06/23/2018    Recent Labs  Lab 08/17/18 0341  NA 141  K 4.0  CL 108  CO2 26  BUN  18  CREATININE 0.80  CALCIUM 8.7*  GLUCOSE 102*     Discharge Medications:  Allergies as of 08/17/2018      Reactions   Penicillins Rash   DID THE REACTION INVOLVE: Swelling of the face/tongue/throat, SOB, or low BP? No Sudden or severe rash/hives, skin peeling, or the inside of the mouth or nose? Unknown Did it require medical treatment? Unknown When did it last happen?Childhood Allergy If all above answers are "NO", may proceed with cephalosporin use.      Medication List    STOP taking these medications    diphenhydrAMINE 25 MG tablet Commonly known as: BENADRYL     TAKE these medications   acetaminophen 500 MG tablet Commonly known as: TYLENOL Take 1,000 mg by mouth daily as needed (nerve pain).   amitriptyline 50 MG tablet Commonly known as: ELAVIL Take 50-75 mg by mouth at bedtime.   atorvastatin 10 MG tablet Commonly known as: LIPITOR Take 10 mg by mouth every evening.   calcium carbonate 500 MG chewable tablet Commonly known as: TUMS - dosed in mg elemental calcium Chew 2 tablets by mouth daily as needed for indigestion or heartburn.   diltiazem 180 MG 24 hr capsule Commonly known as: CARDIZEM CD Take 1 capsule (180 mg total) by mouth daily. What changed: when to take this   dofetilide 500 MCG capsule Commonly known as: TIKOSYN Take 1 capsule (500 mcg total) by mouth 2 (two) times daily.   Eliquis 5 MG Tabs tablet Generic drug: apixaban TAKE 1 TABLET BY MOUTH TWICE DAILY What changed: how much to take   famotidine 20 MG tablet Commonly known as: PEPCID Take 20 mg by mouth daily as needed for heartburn or indigestion.   Fish Oil 1200 MG Caps Take 1,200 mg by mouth 2 (two) times daily.   GAS-X PO Take 2 tablets by mouth daily as needed (gas).   metoprolol succinate 25 MG 24 hr tablet Commonly known as: TOPROL-XL Take 1 tablet (25 mg total) by mouth daily.   multivitamin with minerals Tabs tablet Take 1 tablet by mouth daily.   sildenafil 50 MG tablet Commonly known as: VIAGRA Take 50 mg by mouth daily as needed for erectile dysfunction.   valACYclovir 1000 MG tablet Commonly known as: VALTREX Take 2,000 mg by mouth every 12 (twelve) hours as needed (FOR COLD SORES). Max 2 doses   vitamin C 500 MG tablet Commonly known as: ASCORBIC ACID Take 500 mg by mouth daily as needed (immune support).   vitamin E 400 UNIT capsule Generic drug: vitamin E Take 400 Units by mouth daily.   zolpidem 10 MG tablet Commonly known as: AMBIEN Take 10 mg by mouth at  bedtime as needed for sleep.       Disposition:  Discharge Instructions    Diet - low sodium heart healthy   Complete by: As directed    Increase activity slowly   Complete by: As directed       Duration of Discharge Encounter: Greater than 30 minutes including physician time.  Venetia Night, PA-C 08/17/2018 12:34 PM  EP Attending  Patient seen and examined. Agree with the findings as noted above. The patient has remained in NSR and his QT interval is acceptable. He will be discharged home on dofetilide 500 mcg q12. He will return to the atrial fib clinic next week and then followup with Dr. Greggory Brandy.   Mikle Bosworth.D.

## 2018-08-18 ENCOUNTER — Telehealth (HOSPITAL_COMMUNITY): Payer: Self-pay | Admitting: *Deleted

## 2018-08-18 NOTE — Anesthesia Postprocedure Evaluation (Signed)
Anesthesia Post Note  Patient: Gary [name]  Procedure(s) Performed: CARDIOVERSION (N/A )     Patient location during evaluation: Endoscopy Anesthesia Type: General Level of consciousness: awake and alert Pain management: pain level controlled Vital Signs Assessment: post-procedure vital signs reviewed and stable Respiratory status: spontaneous breathing, nonlabored ventilation, respiratory function stable and patient connected to nasal cannula oxygen Cardiovascular status: blood pressure returned to baseline and stable Postop Assessment: no apparent nausea or vomiting Anesthetic complications: no    Last Vitals:  Vitals:   08/17/18 0454 08/17/18 0958  BP: 114/73 117/76  Pulse: 80 73  Resp: 18   Temp: 36.9 C   SpO2: 98%     Last Pain:  Vitals:   08/17/18 0800  TempSrc:   PainSc: 0-No pain   Pain Goal: Patients Stated Pain Goal: 0 (08/14/18 1142)                 [name] [name]

## 2018-08-18 NOTE — Telephone Encounter (Signed)
Patient called in stating he went back into AF last night. Unlike before [place] he can actually tell he is out of rhythm. HR 112/72 HR 60. With current HR will not make any changes to medication per Roderic Palau Np - can take extra metoprolol if HR should sustain over 100. Pt verbalized understanding.

## 2018-08-19 ENCOUNTER — Telehealth: Payer: Self-pay | Admitting: Cardiology

## 2018-08-19 NOTE — Telephone Encounter (Signed)
I returned a call to the patient. He states that he has continued to have rapid HR up to 140s. His last BP 121/92. He denies any other complaints including chest pain, dyspnea, dizziness or other symptoms.   He took an extra dose of metoprolol (25 mg) in addition to regular daily dose earlier this evening.  I encouraged him to take an additional dose of metoprolol now. He will continue to monitor his symptoms and let us know if his HR is not controlled. He was encouraged to come to the ED with any worsening/severe symptoms.

## 2018-08-21 ENCOUNTER — Ambulatory Visit (HOSPITAL_COMMUNITY)
Admission: RE | Admit: 2018-08-21 | Discharge: 2018-08-21 | Disposition: A | Discharge status: Home / Self Care | Payer: PPO | Source: Ambulatory Visit | Attending: Physician Assistant | Admitting: Physician Assistant

## 2018-08-21 ENCOUNTER — Encounter (HOSPITAL_COMMUNITY): Payer: Self-pay | Admitting: Nurse Practitioner

## 2018-08-21 ENCOUNTER — Other Ambulatory Visit: Payer: Self-pay

## 2018-08-21 DIAGNOSIS — I10 Essential (primary) hypertension: Secondary | ICD-10-CM | POA: Insufficient documentation

## 2018-08-21 DIAGNOSIS — Z79899 Other long term (current) drug therapy: Secondary | ICD-10-CM | POA: Insufficient documentation

## 2018-08-21 DIAGNOSIS — Z881 Allergy status to other antibiotic agents status: Secondary | ICD-10-CM | POA: Diagnosis not present

## 2018-08-21 DIAGNOSIS — I428 Other cardiomyopathies: Secondary | ICD-10-CM | POA: Insufficient documentation

## 2018-08-21 DIAGNOSIS — I4892 Unspecified atrial flutter: Secondary | ICD-10-CM | POA: Insufficient documentation

## 2018-08-21 DIAGNOSIS — Z6828 Body mass index (BMI) 28.0-28.9, adult: Secondary | ICD-10-CM | POA: Diagnosis not present

## 2018-08-21 DIAGNOSIS — Z87891 Personal history of nicotine dependence: Secondary | ICD-10-CM | POA: Diagnosis not present

## 2018-08-21 DIAGNOSIS — Z7901 Long term (current) use of anticoagulants: Secondary | ICD-10-CM | POA: Insufficient documentation

## 2018-08-21 DIAGNOSIS — G4700 Insomnia, unspecified: Secondary | ICD-10-CM | POA: Insufficient documentation

## 2018-08-21 DIAGNOSIS — I4819 Other persistent atrial fibrillation: Secondary | ICD-10-CM | POA: Insufficient documentation

## 2018-08-21 DIAGNOSIS — E785 Hyperlipidemia, unspecified: Secondary | ICD-10-CM | POA: Diagnosis not present

## 2018-08-21 DIAGNOSIS — E669 Obesity, unspecified: Secondary | ICD-10-CM | POA: Diagnosis not present

## 2018-08-21 DIAGNOSIS — Z8249 Family history of ischemic heart disease and other diseases of the circulatory system: Secondary | ICD-10-CM | POA: Insufficient documentation

## 2018-08-21 DIAGNOSIS — I480 Paroxysmal atrial fibrillation: Secondary | ICD-10-CM | POA: Diagnosis not present

## 2018-08-21 LAB — CBC
HCT: 48.8 % (ref 39.0–52.0)
Hemoglobin: 16.6 g/dL (ref 13.0–17.0)
MCH: 32.4 pg (ref 26.0–34.0)
MCHC: 34 g/dL (ref 30.0–36.0)
MCV: 95.1 fL (ref 80.0–100.0)
Platelets: 209 10*3/uL (ref 150–400)
RBC: 5.13 MIL/uL (ref 4.22–5.81)
RDW: 13.5 % (ref 11.5–15.5)
WBC: 8.3 10*3/uL (ref 4.0–10.5)
nRBC: 0 % (ref 0.0–0.2)

## 2018-08-21 LAB — BASIC METABOLIC PANEL
Anion gap: 10 (ref 5–15)
BUN: 16 mg/dL (ref 8–23)
CO2: 25 mmol/L (ref 22–32)
Calcium: 8.9 mg/dL (ref 8.9–10.3)
Chloride: 102 mmol/L (ref 98–111)
Creatinine, Ser: 0.95 mg/dL (ref 0.61–1.24)
GFR calc Af Amer: >60 mL/min (ref >60)
GFR calc non Af Amer: >60 mL/min (ref >60)
Glucose, Bld: 94 mg/dL (ref 70–99)
Potassium: 4 mmol/L (ref 3.5–5.1)
Sodium: 137 mmol/L (ref 135–145)

## 2018-08-21 LAB — MAGNESIUM: Magnesium: 2.3 mg/dL (ref 1.7–2.4)

## 2018-08-21 MED ORDER — METOPROLOL SUCCINATE ER 25 MG PO TB24: ORAL_TABLET | ORAL | 1 refills | Status: DC

## 2018-08-21 NOTE — Patient Instructions (Addendum)
Cardioversion scheduled for Monday, August 3rd  - Arrive at the [facility] and go to admitting at 9:30AM  -Do not eat or drink anything after midnight the night prior to your procedure.  - Take all your morning medication with a sip of water prior to arrival.  - You will not be able to drive home after your procedure.  Increase metoprolol to 25mg  in the AM and 12.5mg  in the PM

## 2018-08-21 NOTE — Progress Notes (Signed)
Date:  08/21/2018   ID:  Gary [name], DOB 1950-12-30, MRN [id number]     PCP:  [name], MD  Primary Cardiologist:  Irish Lack Primary Electrophysiologist:  Allred   CC: persistent  afib   History of Present Illness: Gary [name] is a 68 y.o. male who presents for dofetilide admission today.  Gary [name] had an afib ablation 03/22/18 with protracted pneumonia following Gary procedure. Because of this, he continued to have issues with afib. Then because of covid restrictions his cardioversion was delayed until 06/27/18. He returned to afib and had appointment with [name] 7/1. It was decided to pursue tikosyn to restore SR. He has tested covid negative.   F/u in afib clinic one week after Tikosyn load. He required cardioversion. Unfortunately, SR only lasted 36 hours. He is in afib today at 106 bpm. He has been compliant with tikosyn. No missed eliquis doses.    Today, he denies symptoms of palpitations, chest pain, shortness of breath, orthopnea, PND, lower extremity edema, claudication, dizziness, presyncope, syncope, bleeding, or neurologic sequela. Gary [name] is tolerating medications without difficulties and is otherwise without complaint today. + for fatigue.  he denies symptoms of cough, fevers, chills, or new SOB worrisome for COVID 19.     he has a BMI of Body mass index is 28.37 kg/m.[name] Filed Weights   08/21/18 1406  Weight: 97.5 kg    Past Medical History:  Diagnosis Date   Atrial flutter ([place]) 2007   s/p CTI ablation by Dr Lovena Le   Back pain    Hyperlipidemia    Hypertension    Insomnia    NICM (nonischemic cardiomyopathy) ([place])    a. 2005 - tachy mediated. normalized after return of NSR.   Obesity    Paroxysmal atrial fibrillation ([place])    Past Surgical History:  Procedure Laterality Date   ATRIAL FIBRILLATION ABLATION N/A 03/22/2018   Procedure: ATRIAL FIBRILLATION ABLATION;  Surgeon: Thompson Grayer, MD;  Location: [facility];   Service: Cardiovascular;  Laterality: N/A;   atrial flutter ablation  06/10/2005   CTI ablation by Dr Lovena Le for atrial flutter   CARDIOVERSION N/A 07/20/2016   Procedure: CARDIOVERSION;  Surgeon: Minus Breeding, MD;  Location: [facility] ENDOSCOPY;  Service: Cardiovascular;  Laterality: N/A;   CARDIOVERSION N/A 12/13/2017   Procedure: CARDIOVERSION;  Surgeon: Thayer Headings, MD;  Location: [place];  Service: Cardiovascular;  Laterality: N/A;   CARDIOVERSION N/A 01/30/2018   Procedure: CARDIOVERSION;  Surgeon: Fay Records, MD;  Location: [facility] ENDOSCOPY;  Service: Cardiovascular;  Laterality: N/A;   CARDIOVERSION N/A 06/27/2018   Procedure: CARDIOVERSION;  Surgeon: Buford Dresser, MD;  Location: [facility] ENDOSCOPY;  Service: Cardiovascular;  Laterality: N/A;   CARDIOVERSION N/A 08/16/2018   Procedure: CARDIOVERSION;  Surgeon: [name], MD;  Location[facility] ENDOSCOPY;  Service: Cardiovascular;  Laterality: N/A;   NO PAST SURGERIES       Current Outpatient Medications  Medication Sig Dispense Refill   acetaminophen (TYLENOL) 500 MG tablet Take 1,000 mg by mouth daily as needed (nerve pain).      amitriptyline (ELAVIL) 50 MG tablet Take 50-75 mg by mouth at bedtime.      atorvastatin (LIPITOR) 10 MG tablet Take 10 mg by mouth every evening.      calcium carbonate (TUMS - DOSED IN MG ELEMENTAL CALCIUM) 500 MG chewable tablet Chew 2 tablets by mouth daily as needed for indigestion or heartburn.     diltiazem (CARDIZEM  CD) 180 MG 24 hr capsule Take 1 capsule (180 mg total) by mouth daily. ([name] taking differently: Take 180 mg by mouth every evening. ) 90 capsule 1   dofetilide (TIKOSYN) 500 MCG capsule Take 1 capsule (500 mcg total) by mouth 2 (two) times daily. 180 capsule 2   ELIQUIS 5 MG TABS tablet TAKE 1 TABLET BY MOUTH TWICE DAILY ([name] taking differently: Take 5 mg by mouth 2 (two) times daily. ) 180 tablet 3   famotidine (PEPCID) 20 MG tablet Take 20 mg by mouth daily  as needed for heartburn or indigestion.     metoprolol succinate (TOPROL-XL) 25 MG 24 hr tablet Take 1 tablet in Gary AM and 1/2 tablet in Gary PM 90 tablet 1   Multiple Vitamin (MULTIVITAMIN WITH MINERALS) TABS tablet Take 1 tablet by mouth daily.     Omega-3 Fatty Acids (FISH OIL) 1200 MG CAPS Take 1,200 mg by mouth 2 (two) times daily.      sildenafil (VIAGRA) 50 MG tablet Take 50 mg by mouth daily as needed for erectile dysfunction.     Simethicone (GAS-X PO) Take 2 tablets by mouth daily as needed (gas).     valACYclovir (VALTREX) 1000 MG tablet Take 2,000 mg by mouth every 12 (twelve) hours as needed (FOR COLD SORES). Max 2 doses     vitamin C (ASCORBIC ACID) 500 MG tablet Take 500 mg by mouth daily as needed (immune support).     vitamin E (VITAMIN E) 400 UNIT capsule Take 400 Units by mouth daily.     zolpidem (AMBIEN) 10 MG tablet Take 10 mg by mouth at bedtime as needed for sleep.     No current facility-administered medications for this encounter.     Allergies:   Penicillins   Social History:  Gary [name]  reports that he has quit smoking. He has never used smokeless tobacco. He reports current alcohol use. He reports that he does not use drugs.   Family History:  Gary [name]'s  family history includes Heart attack in his father; Hypertension in his father and mother.    ROS:  Please see Gary history of present illness.   All other systems are personally reviewed and negative.   Exam: GEN- Gary [name] is well appearing, alert and oriented x 3 today.   Head- normocephalic, atraumatic Eyes-  Sclera clear, conjunctiva pink Ears- hearing intact Oropharynx- clear Neck- supple, no JVP Lymph- no cervical lymphadenopathy Lungs- Clear to ausculation bilaterally, normal work of breathing Heart- irregular rate and rhythm, no murmurs, rubs or gallops, PMI not laterally displaced GI- soft, NT, ND, + BS Extremities- no clubbing, cyanosis, or edema MS- no significant deformity  or atrophy Skin- no rash or lesion Psych- euthymic mood, full affect Neuro- strength and sensation are intact  Recent Labs: 11/22/2017: TSH 3.090 08/17/2018: BUN 18; Creatinine, Ser 0.80; Magnesium 2.2; Potassium 4.0; Sodium 141 08/21/2018: Hemoglobin 16.6; Platelets 209  personally reviewed    Other studies personally reviewed: Additional studies/ records that were reviewed today include: Epic records EKG-afib at 104 bpm, qrs int 88 ms, qtc  420 ma    ASSESSMENT AND PLAN:  1. Persistent  atrial fibrillation S/p ablation afib  07/9890 complicated by protracted pneumonia and now failed DCCV Failed flecainide  Now failing tikosyn However, will try one additional cardioversion to see if things will settle down and he will return to SR Tikosyn precautions reviewed, continue 500 mcgs bid Continue cardizem 120 mg daily Increase metoprolol to 25 mg  q am and 1/2 tab in pm Continue eliqius 5 mg bid, states no missed doses for Gary last 3 weeks This patients CHA2DS2-VASc Score and unadjusted Ischemic Stroke Rate (% per year) is equal to 2.2 % stroke rate/year from a score of 2 Cmet/mag/cbc ordered covid screen  Here for ekg after cardioversion  F/u with [name] 8/26     Current medicines are reviewed at length with Gary [name] today.   Gary [name] does not have concerns regarding his medicines.  Gary following changes were made today:  Stopped flecainide   Labs/ tests ordered today include: none Orders Placed This Encounter  Procedures   Basic metabolic panel   Magnesium   CBC    [name] Risk:  after full review of this patients clinical status, I feel that they are at moderate risk at this time.   Today, I have spent 25 minutes with Gary [name] with telehealth technology discussing above   Signed, Roderic Palau NP  08/21/2018 2:54 PM  [facility] [address] [place], [place] [address] [phone]

## 2018-08-21 NOTE — H&P (View-Only) (Signed)
Date:  08/21/2018   ID:  Gary [name], DOB 02/11/1950, MRN [id number]     PCP:  [name], MD  Primary Cardiologist:  Irish Lack Primary Electrophysiologist:  Allred   CC: persistent  afib   History of Present Illness: Gary [name] is a 68 y.o. male who presents for dofetilide admission today.  Gary [name] had an afib ablation 03/22/18 with protracted pneumonia following the procedure. Because of this, he continued to have issues with afib. Then because of covid restrictions his cardioversion was delayed until 06/27/18. He returned to afib and had appointment with [name] 7/1. It was decided to pursue tikosyn to restore SR. He has tested covid negative.   F/u in afib clinic one week after Tikosyn load. He required cardioversion. Unfortunately, SR only lasted 36 hours. He is in afib today at 106 bpm. He has been compliant with tikosyn. No missed eliquis doses.    Today, he denies symptoms of palpitations, chest pain, shortness of breath, orthopnea, PND, lower extremity edema, claudication, dizziness, presyncope, syncope, bleeding, or neurologic sequela. The patient is tolerating medications without difficulties and is otherwise without complaint today. + for fatigue.  he denies symptoms of cough, fevers, chills, or new SOB worrisome for COVID 19.     he has a BMI of Body mass index is 28.37 kg/m.[name] Filed Weights   08/21/18 1406  Weight: 97.5 kg    Past Medical History:  Diagnosis Date   Atrial flutter ([place]) 2007   s/p CTI ablation by Dr Lovena Le   Back pain    Hyperlipidemia    Hypertension    Insomnia    NICM (nonischemic cardiomyopathy) ([place])    a. 2005 - tachy mediated. normalized after return of NSR.   Obesity    Paroxysmal atrial fibrillation ([place])    Past Surgical History:  Procedure Laterality Date   ATRIAL FIBRILLATION ABLATION N/A 03/22/2018   Procedure: ATRIAL FIBRILLATION ABLATION;  Surgeon: Thompson Grayer, MD;  Location: [place] [facility];   Service: Cardiovascular;  Laterality: N/A;   atrial flutter ablation  06/10/2005   CTI ablation by Dr Lovena Le for atrial flutter   CARDIOVERSION N/A 07/20/2016   Procedure: CARDIOVERSION;  Surgeon: Minus Breeding, MD;  Location: [facility] ENDOSCOPY;  Service: Cardiovascular;  Laterality: N/A;   CARDIOVERSION N/A 12/13/2017   Procedure: CARDIOVERSION;  Surgeon: Thayer Headings, MD;  Location: [place];  Service: Cardiovascular;  Laterality: N/A;   CARDIOVERSION N/A 01/30/2018   Procedure: CARDIOVERSION;  Surgeon: Fay Records, MD;  Location: [facility] ENDOSCOPY;  Service: Cardiovascular;  Laterality: N/A;   CARDIOVERSION N/A 06/27/2018   Procedure: CARDIOVERSION;  Surgeon: Buford Dresser, MD;  Location: [facility] ENDOSCOPY;  Service: Cardiovascular;  Laterality: N/A;   CARDIOVERSION N/A 08/16/2018   Procedure: CARDIOVERSION;  Surgeon: [name], MD;  Location: [facility] ENDOSCOPY;  Service: Cardiovascular;  Laterality: N/A;   NO PAST SURGERIES       Current Outpatient Medications  Medication Sig Dispense Refill   acetaminophen (TYLENOL) 500 MG tablet Take 1,000 mg by mouth daily as needed (nerve pain).      amitriptyline (ELAVIL) 50 MG tablet Take 50-75 mg by mouth at bedtime.      atorvastatin (LIPITOR) 10 MG tablet Take 10 mg by mouth every evening.      calcium carbonate (TUMS - DOSED IN MG ELEMENTAL CALCIUM) 500 MG chewable tablet Chew 2 tablets by mouth daily as needed for indigestion or heartburn.     diltiazem (CARDIZEM  CD) 180 MG 24 hr capsule Take 1 capsule (180 mg total) by mouth daily. (Patient taking differently: Take 180 mg by mouth every evening. ) 90 capsule 1   dofetilide (TIKOSYN) 500 MCG capsule Take 1 capsule (500 mcg total) by mouth 2 (two) times daily. 180 capsule 2   ELIQUIS 5 MG TABS tablet TAKE 1 TABLET BY MOUTH TWICE DAILY (Patient taking differently: Take 5 mg by mouth 2 (two) times daily. ) 180 tablet 3   famotidine (PEPCID) 20 MG tablet Take 20 mg by mouth daily  as needed for heartburn or indigestion.     metoprolol succinate (TOPROL-XL) 25 MG 24 hr tablet Take 1 tablet in the AM and 1/2 tablet in the PM 90 tablet 1   Multiple Vitamin (MULTIVITAMIN WITH MINERALS) TABS tablet Take 1 tablet by mouth daily.     Omega-3 Fatty Acids (FISH OIL) 1200 MG CAPS Take 1,200 mg by mouth 2 (two) times daily.      sildenafil (VIAGRA) 50 MG tablet Take 50 mg by mouth daily as needed for erectile dysfunction.     Simethicone (GAS-X PO) Take 2 tablets by mouth daily as needed (gas).     valACYclovir (VALTREX) 1000 MG tablet Take 2,000 mg by mouth every 12 (twelve) hours as needed (FOR COLD SORES). Max 2 doses     vitamin C (ASCORBIC ACID) 500 MG tablet Take 500 mg by mouth daily as needed (immune support).     vitamin E (VITAMIN E) 400 UNIT capsule Take 400 Units by mouth daily.     zolpidem (AMBIEN) 10 MG tablet Take 10 mg by mouth at bedtime as needed for sleep.     No current facility-administered medications for this encounter.     Allergies:   Penicillins   Social History:  The patient  reports that he has quit smoking. He has never used smokeless tobacco. He reports current alcohol use. He reports that he does not use drugs.   Family History:  The patient's  family history includes Heart attack in his father; Hypertension in his father and mother.    ROS:  Please see the history of present illness.   All other systems are personally reviewed and negative.   Exam: GEN- The patient is well appearing, alert and oriented x 3 today.   Head- normocephalic, atraumatic Eyes-  Sclera clear, conjunctiva pink Ears- hearing intact Oropharynx- clear Neck- supple, no JVP Lymph- no cervical lymphadenopathy Lungs- Clear to ausculation bilaterally, normal work of breathing Heart- irregular rate and rhythm, no murmurs, rubs or gallops, PMI not laterally displaced GI- soft, NT, ND, + BS Extremities- no clubbing, cyanosis, or edema MS- no significant deformity  or atrophy Skin- no rash or lesion Psych- euthymic mood, full affect Neuro- strength and sensation are intact  Recent Labs: 11/22/2017: TSH 3.090 08/17/2018: BUN 18; Creatinine, Ser 0.80; Magnesium 2.2; Potassium 4.0; Sodium 141 08/21/2018: Hemoglobin 16.6; Platelets 209  personally reviewed    Other studies personally reviewed: Additional studies/ records that were reviewed today include: Epic records EKG-afib at 104 bpm, qrs int 88 ms, qtc  420 ma    ASSESSMENT AND PLAN:  1. Persistent  atrial fibrillation S/p ablation afib  04/979 complicated by protracted pneumonia and now failed DCCV Failed flecainide  Now failing tikosyn However, will try one additional cardioversion to see if things will settle down and he will return to SR Tikosyn precautions reviewed, continue 500 mcgs bid Continue cardizem 120 mg daily Increase metoprolol to 25 mg  q am and 1/2 tab in pm Continue eliqius 5 mg bid, states no missed doses for the last 3 weeks This patients CHA2DS2-VASc Score and unadjusted Ischemic Stroke Rate (% per year) is equal to 2.2 % stroke rate/year from a score of 2 Cmet/mag/cbc ordered covid screen  Here for ekg after cardioversion  F/u with [name] 8/26     Current medicines are reviewed at length with the patient today.   The patient does not have concerns regarding his medicines.  The following changes were made today:  Stopped flecainide   Labs/ tests ordered today include: none Orders Placed This Encounter  Procedures   Basic metabolic panel   Magnesium   CBC    Patient Risk:  after full review of this patients clinical status, I feel that they are at moderate risk at this time.   Today, I have spent 25 minutes with the patient with telehealth technology discussing above   Signed, Roderic Palau NP  08/21/2018 2:54 PM  [facility] [address] [place], [place] [address] [phone]

## 2018-08-24 ENCOUNTER — Ambulatory Visit (HOSPITAL_COMMUNITY): Payer: PPO | Admitting: Physician Assistant

## 2018-08-24 ENCOUNTER — Other Ambulatory Visit (HOSPITAL_COMMUNITY)
Admission: RE | Admit: 2018-08-24 | Discharge: 2018-08-24 | Disposition: A | Discharge status: Home / Self Care | Payer: PPO | Source: Ambulatory Visit | Attending: Internal Medicine | Admitting: Internal Medicine

## 2018-08-24 DIAGNOSIS — Z20828 Contact with and (suspected) exposure to other viral communicable diseases: Secondary | ICD-10-CM | POA: Diagnosis not present

## 2018-08-24 LAB — SARS CORONAVIRUS 2 (TAT 6-24 HRS): SARS Coronavirus 2: NEGATIVE

## 2018-08-25 NOTE — Progress Notes (Signed)
Pre-procedure call placed to verify that the patient will be quarantined until their procedure on Monday 8/3. If anything were to come up he knows he should call [facility] and leave a VM.

## 2018-08-28 ENCOUNTER — Encounter (HOSPITAL_COMMUNITY): Payer: Self-pay | Admitting: *Deleted

## 2018-08-28 ENCOUNTER — Encounter (HOSPITAL_COMMUNITY)
Admission: RE | Disposition: A | Discharge status: Home / Self Care | Payer: Self-pay | Source: Home / Self Care | Attending: Cardiovascular Disease

## 2018-08-28 ENCOUNTER — Ambulatory Visit (HOSPITAL_COMMUNITY): Payer: PPO | Admitting: Anesthesiology

## 2018-08-28 ENCOUNTER — Ambulatory Visit (HOSPITAL_COMMUNITY)
Admission: RE | Admit: 2018-08-28 | Discharge: 2018-08-28 | Disposition: A | Discharge status: Home / Self Care | Payer: PPO | Attending: Cardiovascular Disease | Admitting: Cardiovascular Disease

## 2018-08-28 ENCOUNTER — Other Ambulatory Visit: Payer: Self-pay

## 2018-08-28 DIAGNOSIS — I428 Other cardiomyopathies: Secondary | ICD-10-CM | POA: Insufficient documentation

## 2018-08-28 DIAGNOSIS — E669 Obesity, unspecified: Secondary | ICD-10-CM | POA: Diagnosis not present

## 2018-08-28 DIAGNOSIS — Z7901 Long term (current) use of anticoagulants: Secondary | ICD-10-CM | POA: Insufficient documentation

## 2018-08-28 DIAGNOSIS — I10 Essential (primary) hypertension: Secondary | ICD-10-CM | POA: Diagnosis not present

## 2018-08-28 DIAGNOSIS — Z8249 Family history of ischemic heart disease and other diseases of the circulatory system: Secondary | ICD-10-CM | POA: Diagnosis not present

## 2018-08-28 DIAGNOSIS — G4700 Insomnia, unspecified: Secondary | ICD-10-CM | POA: Insufficient documentation

## 2018-08-28 DIAGNOSIS — E785 Hyperlipidemia, unspecified: Secondary | ICD-10-CM | POA: Insufficient documentation

## 2018-08-28 DIAGNOSIS — Z6828 Body mass index (BMI) 28.0-28.9, adult: Secondary | ICD-10-CM | POA: Diagnosis not present

## 2018-08-28 DIAGNOSIS — I4891 Unspecified atrial fibrillation: Secondary | ICD-10-CM | POA: Diagnosis not present

## 2018-08-28 DIAGNOSIS — I4819 Other persistent atrial fibrillation: Secondary | ICD-10-CM

## 2018-08-28 DIAGNOSIS — I4892 Unspecified atrial flutter: Secondary | ICD-10-CM | POA: Diagnosis not present

## 2018-08-28 DIAGNOSIS — Z87891 Personal history of nicotine dependence: Secondary | ICD-10-CM | POA: Insufficient documentation

## 2018-08-28 DIAGNOSIS — Z79899 Other long term (current) drug therapy: Secondary | ICD-10-CM | POA: Insufficient documentation

## 2018-08-28 DIAGNOSIS — I429 Cardiomyopathy, unspecified: Secondary | ICD-10-CM | POA: Diagnosis not present

## 2018-08-28 DIAGNOSIS — Z880 Allergy status to penicillin: Secondary | ICD-10-CM | POA: Diagnosis not present

## 2018-08-28 HISTORY — PX: CARDIOVERSION: SHX1299

## 2018-08-28 SURGERY — CARDIOVERSION
Anesthesia: General

## 2018-08-28 MED ORDER — PROPOFOL 10 MG/ML IV BOLUS
INTRAVENOUS | Status: DC | PRN
  Administered 2018-08-28: 40 mg via INTRAVENOUS
  Administered 2018-08-28: 20 mg via INTRAVENOUS
  Administered 2018-08-28: 10 mg via INTRAVENOUS

## 2018-08-28 MED ORDER — SODIUM CHLORIDE 0.9 % IV SOLN
INTRAVENOUS | Status: DC
  Administered 2018-08-28: 10:00:00 via INTRAVENOUS

## 2018-08-28 NOTE — Anesthesia Preprocedure Evaluation (Signed)
Anesthesia Evaluation  Patient identified by MRN, date of birth, ID band Patient awake    Reviewed: Allergy & Precautions, NPO status , Patient's Chart, lab work & pertinent test results, reviewed documented beta blocker date and time   History of Anesthesia Complications Negative for: history of anesthetic complications  Airway Mallampati: III  TM Distance: >3 FB Neck ROM: Full    Dental  (+) Dental Advisory Given   Pulmonary neg recent URI, former smoker,    breath sounds clear to auscultation       Cardiovascular hypertension, Pt. on medications and Pt. on home beta blockers + dysrhythmias Atrial Fibrillation  Rhythm:Irregular     Neuro/Psych negative neurological ROS  negative psych ROS   GI/Hepatic Neg liver ROS, GERD  Medicated and Controlled,  Endo/Other  negative endocrine ROS  Renal/GU negative Renal ROS     Musculoskeletal   Abdominal   Peds  Hematology eliquis   Anesthesia Other Findings   Reproductive/Obstetrics                             Anesthesia Physical Anesthesia Plan  ASA: III  Anesthesia Plan: General   Post-op Pain Management:    Induction: Intravenous  PONV Risk Score and Plan: 2 and Treatment may vary due to age or medical condition  Airway Management Planned: Mask  Additional Equipment: None  Intra-op Plan:   Post-operative Plan: Extubation in OR  Informed Consent: I have reviewed the patients History and Physical, chart, labs and discussed the procedure including the risks, benefits and alternatives for the proposed anesthesia with the patient or authorized representative who has indicated his/her understanding and acceptance.     Dental advisory given  Plan Discussed with: CRNA and Surgeon  Anesthesia Plan Comments:         Anesthesia Quick Evaluation

## 2018-08-28 NOTE — Interval H&P Note (Signed)
History and Physical Interval Note:  08/28/2018 10:09 AM  Gary [name]  has presented today for surgery, with the diagnosis of A-FIB.  The various methods of treatment have been discussed with the patient and family. After consideration of risks, benefits and other options for treatment, the patient has consented to  Procedure(s): CARDIOVERSION (N/A) as a surgical intervention.  The patient's history has been reviewed, patient examined, no change in status, stable for surgery.  I have reviewed the patient's chart and labs.  Questions were answered to the patient's satisfaction.     Mertie Moores

## 2018-08-28 NOTE — Transfer of Care (Signed)
Immediate Anesthesia Transfer of Care Note  Patient: Gary [name]  Procedure(s) Performed: CARDIOVERSION (N/A )  Patient Location: Endoscopy Unit  Anesthesia Type:General  Level of Consciousness: awake, alert , oriented and patient cooperative  Airway & Oxygen Therapy: Patient Spontanous Breathing and Patient connected to nasal cannula oxygen  Post-op Assessment: Report given to RN and Post -op Vital signs reviewed and stable  Post vital signs: Reviewed and stable  Last Vitals:  Vitals Value Taken Time  BP    Temp    Pulse    Resp    SpO2      Last Pain:  Vitals:   08/28/18 0958  TempSrc: Temporal  PainSc: 0-No pain         Complications: No apparent anesthesia complications

## 2018-08-28 NOTE — Discharge Instructions (Signed)
°  YOU HAD AN CARDIAC PROCEDURE TODAY: Refer to the procedure report and other information in the discharge instructions given to you for any specific questions about what was found during the examination. If this information does not answer your questions, please call [facility] office at [phone] to clarify.   DIET: Your first meal following the procedure should be a light meal and then it is ok to progress to your normal diet. A half-sandwich or bowl of soup is an example of a good first meal. Heavy or fried foods are harder to digest and may make you feel nauseous or bloated. Drink plenty of fluids but you should avoid alcoholic beverages for 24 hours. If you had a esophageal dilation, please see attached instructions for diet.   ACTIVITY: Your care partner should take you home directly after the procedure. You should plan to take it easy, moving slowly for the rest of the day. You can resume normal activity the day after the procedure however YOU SHOULD NOT DRIVE, use power tools, machinery or perform tasks that involve climbing or major physical exertion for 24 hours (because of the sedation medicines used during the test).   SYMPTOMS TO REPORT IMMEDIATELY: A cardiologist can be reached at any hour. Please call [phone] for any of the following symptoms:  Vomiting of blood or coffee ground material  New, significant abdominal pain  New, significant chest pain or pain under the shoulder blades  Painful or persistently difficult swallowing  New shortness of breath  Black, tarry-looking or red, bloody stools  FOLLOW UP:  Please also call with any specific questions about appointments or follow up tests.  TEE  YOU HAD AN CARDIAC PROCEDURE TODAY: Refer to the procedure report and other information in the discharge instructions given to you for any specific questions about what was found during the examination. If this information does not answer your questions, please call [facility] office at [phone] to clarify.   DIET: Your first meal following the procedure should be a light meal and then it is ok to progress to your normal diet. A half-sandwich or bowl of soup is an example of a good first meal. Heavy or fried foods are harder to digest and may make you feel nauseous or bloated. Drink plenty of fluids but you should avoid alcoholic beverages for 24 hours. If you had a esophageal dilation, please see attached instructions for diet.   ACTIVITY: Your care partner should take you home directly after the procedure. You should plan to take it easy, moving slowly for the rest of the day. You can resume normal activity the day after the procedure however YOU SHOULD NOT DRIVE, use power tools, machinery or perform tasks that involve climbing or major physical exertion for 24 hours (because of the sedation medicines used during the test).   SYMPTOMS TO REPORT IMMEDIATELY: A cardiologist can be reached at any hour. Please call [phone] for any of the following symptoms:  Vomiting of blood or coffee ground material  New, significant abdominal pain  New, significant chest pain or pain under the shoulder blades  Painful or persistently difficult swallowing  New shortness of breath  Black, tarry-looking or red, bloody stools  FOLLOW UP:  Please also call with any specific questions about appointments or follow up tests.

## 2018-08-28 NOTE — CV Procedure (Signed)
    Cardioversion Note  Gary [name] [id number] 1950/11/30  Procedure: DC Cardioversion Indications: atrial fib   Procedure Details Consent: Obtained Time Out: Verified patient identification, verified procedure, site/side was marked, verified correct patient position, special equipment/implants available, Radiology Safety Procedures followed,  medications/allergies/relevent history reviewed, required imaging and test results available.  Performed  The patient has been on adequate anticoagulation.  The patient received IV  Propofol 70 mg  for sedation.  Synchronous cardioversion was performed at 150  joules.  The cardioversion was successful     Complications: No apparent complications Patient did tolerate procedure well.   Thayer Headings, Brooke Bonito., MD, [facility] 08/28/2018, 10:25 AM

## 2018-08-29 NOTE — Anesthesia Postprocedure Evaluation (Signed)
Anesthesia Post Note  Patient: Gary [name]  Procedure(s) Performed: CARDIOVERSION (N/A )     Patient location during evaluation: Endoscopy Anesthesia Type: General Level of consciousness: awake and alert Pain management: pain level controlled Vital Signs Assessment: post-procedure vital signs reviewed and stable Respiratory status: spontaneous breathing, nonlabored ventilation, respiratory function stable and patient connected to nasal cannula oxygen Cardiovascular status: stable Postop Assessment: no apparent nausea or vomiting Anesthetic complications: no    Last Vitals:  Vitals:   08/28/18 1040 08/28/18 1050  BP: 91/65 105/71  Pulse: 68 69  Resp: 18 11  Temp:    SpO2: 96% 98%    Last Pain:  Vitals:   08/28/18 1033  TempSrc: Temporal  PainSc: 0-No pain                 [name]

## 2018-09-05 ENCOUNTER — Ambulatory Visit (HOSPITAL_COMMUNITY)
Admission: RE | Admit: 2018-09-05 | Discharge: 2018-09-05 | Disposition: A | Discharge status: Home / Self Care | Payer: PPO | Source: Ambulatory Visit | Attending: Physician Assistant | Admitting: Physician Assistant

## 2018-09-05 ENCOUNTER — Other Ambulatory Visit: Payer: Self-pay

## 2018-09-05 VITALS — BP 118/64 | HR 68

## 2018-09-05 DIAGNOSIS — I4819 Other persistent atrial fibrillation: Secondary | ICD-10-CM | POA: Insufficient documentation

## 2018-09-05 NOTE — Progress Notes (Signed)
Pt in for repeat ECG after DCCV. Patient reports that he feels well today and has less dyspnea with exertion. ECG shows SR HR 68, PR 198, QRS 84, QTc 414. Patient to follow up with Dr Rayann Heman as scheduled.

## 2018-09-18 ENCOUNTER — Telehealth: Payer: Self-pay

## 2018-09-18 NOTE — Telephone Encounter (Signed)
Spoke with pt regarding appt on 09/20/18. Pt stated he will check his vitals prior to his appt and did not have ant questions at this time.

## 2018-09-20 ENCOUNTER — Telehealth (INDEPENDENT_AMBULATORY_CARE_PROVIDER_SITE_OTHER): Payer: PPO | Admitting: Internal Medicine

## 2018-09-20 ENCOUNTER — Encounter: Payer: Self-pay | Admitting: Internal Medicine

## 2018-09-20 VITALS — BP 122/78 | HR 62 | Ht 73.0 in | Wt 210.0 lb

## 2018-09-20 DIAGNOSIS — I10 Essential (primary) hypertension: Secondary | ICD-10-CM

## 2018-09-20 DIAGNOSIS — Z8679 Personal history of other diseases of the circulatory system: Secondary | ICD-10-CM

## 2018-09-20 DIAGNOSIS — I4819 Other persistent atrial fibrillation: Secondary | ICD-10-CM | POA: Diagnosis not present

## 2018-09-20 NOTE — Progress Notes (Signed)
Electrophysiology TeleHealth Note   Due to national recommendations of social distancing due to COVID 19, an audio/video telehealth visit is felt to be most appropriate for this patient at this time.  See MyChart message from today for the patient's consent to telehealth for [facility].   Date:  09/20/2018   ID:  Gary [name], DOB Jul 05, 1950, MRN [id number]  Location: patient's home  Provider location:  [facility]  Evaluation Performed: Follow-up visit  PCP:  [name], MD   Electrophysiologist:  Dr Rayann Heman  Chief Complaint:  palpitations  History of Present Illness:    Gary [name] is a 68 y.o. male who presents via telehealth conferencing today.  Since last being seen in our clinic, the patient reports doing reasonably well. He has had some afib since starting tikosyn, but feels that this is getting better.  He is unaware of triggers or precipitants.  Recent episodes have lasted less than 10 minutes. Symptoms were palpitations. Today, he denies symptoms of chest pain, lower extremity edema, dizziness, presyncope, or syncope.  He has an occasional dry cough in the evening when laying down.  HIs breathing is good.  He denies SOB.  Feels well.  Exercise tolerance is preserved.  The patient is otherwise without complaint today.  The patient denies symptoms of fevers, chills, cough, or new SOB worrisome for COVID 19.  Past Medical History:  Diagnosis Date  . Atrial flutter ([place]) 2007   s/p CTI ablation by Dr Lovena Le  . Back pain   . Hyperlipidemia   . Hypertension   . Insomnia   . NICM (nonischemic cardiomyopathy) ([place])    a. 2005 - tachy mediated. normalized after return of NSR.  [name] Obesity   . Paroxysmal atrial fibrillation [facility])     Past Surgical History:  Procedure Laterality Date  . ATRIAL FIBRILLATION ABLATION N/A 03/22/2018   Procedure: ATRIAL FIBRILLATION ABLATION;  Surgeon: Thompson Grayer, MD;  Location: [place] [facility];  Service: Cardiovascular;   Laterality: N/A;  . atrial flutter ablation  06/10/2005   CTI ablation by Dr Lovena Le for atrial flutter  . CARDIOVERSION N/A 07/20/2016   Procedure: CARDIOVERSION;  Surgeon: Minus Breeding, MD;  Location: [facility] ENDOSCOPY;  Service: Cardiovascular;  Laterality: N/A;  . CARDIOVERSION N/A 12/13/2017   Procedure: CARDIOVERSION;  Surgeon: Thayer Headings, MD;  Location: [place];  Service: Cardiovascular;  Laterality: N/A;  . CARDIOVERSION N/A 01/30/2018   Procedure: CARDIOVERSION;  Surgeon: Fay Records, MD;  Location: [place];  Service: Cardiovascular;  Laterality: N/A;  . CARDIOVERSION N/A 06/27/2018   Procedure: CARDIOVERSION;  Surgeon: Buford Dresser, MD;  Location: [facility] ENDOSCOPY;  Service: Cardiovascular;  Laterality: N/A;  . CARDIOVERSION N/A 08/16/2018   Procedure: CARDIOVERSION;  Surgeon: [name], MD;  Location: [place];  Service: Cardiovascular;  Laterality: N/A;  . CARDIOVERSION N/A 08/28/2018   Procedure: CARDIOVERSION;  Surgeon: Acie Fredrickson Wonda Cheng, MD;  Location: [place];  Service: Cardiovascular;  Laterality: N/A;  . NO PAST SURGERIES      Current Outpatient Medications  Medication Sig Dispense Refill  . acetaminophen (TYLENOL) 500 MG tablet Take 1,000 mg by mouth daily as needed (nerve pain).     [name] amitriptyline (ELAVIL) 50 MG tablet Take 50-75 mg by mouth at bedtime.     [name] atorvastatin (LIPITOR) 10 MG tablet Take 10 mg by mouth every evening.     . calcium carbonate (TUMS - DOSED IN MG ELEMENTAL CALCIUM) 500 MG chewable tablet Chew 2  tablets by mouth daily as needed for indigestion or heartburn.    . diltiazem (CARDIZEM CD) 180 MG 24 hr capsule Take 1 capsule (180 mg total) by mouth daily. (Patient taking differently: Take 180 mg by mouth every evening. ) 90 capsule 1  . dofetilide (TIKOSYN) 500 MCG capsule Take 1 capsule (500 mcg total) by mouth 2 (two) times daily. 180 capsule 2  . ELIQUIS 5 MG TABS tablet TAKE 1 TABLET BY MOUTH TWICE DAILY (Patient  taking differently: Take 5 mg by mouth 2 (two) times daily. ) 180 tablet 3  . famotidine (PEPCID) 20 MG tablet Take 20 mg by mouth daily as needed for heartburn or indigestion.    . metoprolol succinate (TOPROL-XL) 25 MG 24 hr tablet Take 1 tablet in the AM and 1/2 tablet in the PM 90 tablet 1  . Multiple Vitamin (MULTIVITAMIN WITH MINERALS) TABS tablet Take 1 tablet by mouth daily.    . Omega-3 Fatty Acids (FISH OIL) 1200 MG CAPS Take 1,200 mg by mouth 2 (two) times daily.     . sildenafil (VIAGRA) 50 MG tablet Take 50 mg by mouth daily as needed for erectile dysfunction.    . Simethicone (GAS-X PO) Take 2 tablets by mouth daily as needed (gas).    . valACYclovir (VALTREX) 1000 MG tablet Take 2,000 mg by mouth every 12 (twelve) hours as needed (FOR COLD SORES). Max 2 doses    . vitamin C (ASCORBIC ACID) 500 MG tablet Take 500 mg by mouth daily as needed (immune support).    . vitamin E (VITAMIN E) 400 UNIT capsule Take 400 Units by mouth daily.    [name] zolpidem (AMBIEN) 10 MG tablet Take 10 mg by mouth at bedtime as needed for sleep.     No current facility-administered medications for this visit.     Allergies:   Penicillins   Social History:  The patient  reports that he has quit smoking. He has never used smokeless tobacco. He reports current alcohol use. He reports that he does not use drugs.   Family History:  The patient's  family history includes Heart attack in his father; Hypertension in his father and mother.   ROS:  Please see the history of present illness.   All other systems are personally reviewed and negative.    Exam:    Vital Signs:  BP 122/78   Pulse 62   Ht 6\' 1"  (1.854 m)   Wt 210 lb (95.3 kg)   BMI 27.71 kg/m   Well sounding and appearing, alert and conversant, regular work of breathing,  good skin color Eyes- anicteric, neuro- grossly intact, skin- no apparent rash or lesions or cyanosis, mouth- oral mucosa is pink  Labs/Other Tests and Data Reviewed:     Recent Labs: 11/22/2017: TSH 3.090 08/21/2018: BUN 16; Creatinine, Ser 0.95; Hemoglobin 16.6; Magnesium 2.3; Platelets 209; Potassium 4.0; Sodium 137   Wt Readings from Last 3 Encounters:  09/20/18 210 lb (95.3 kg)  08/28/18 214 lb 6.5 oz (97.3 kg)  08/21/18 215 lb (97.5 kg)        ASSESSMENT & PLAN:    1.  Persistent atrial fibrillation He has failed medical therapy with flecainide Sp PVI 02/2018 and prior CTI ablations Recently loaded with tikosyn and doing better If he as further issues, we should consider either amiodarone or repeat ablation.  Given his chronic cough and prior pneumonia, I would avoid pneumonia. Continue eliquis for chads2vasc score of 2 Will need bmet, mg  and ekg on return  2. HTN Stable No change required today  3. Overweight Lifestyle modification encouraged  4. ETOH Avoidance encouraged  5. Prior tachycardia mediated CM EF has recovered with sinus  Follow-up:  2 months with me in office for an ekg   Patient Risk:  after full review of this patients clinical status, I feel that they are at moderate risk at this time.  Today, I have spent 15 minutes with the patient with telehealth technology discussing arrhythmia management .    Army Fossa, MD  09/20/2018 11:40 AM     [facility] [address] [place] [address] [phone] (office) [phone] (fax)

## 2018-09-21 ENCOUNTER — Other Ambulatory Visit: Payer: Self-pay | Admitting: Nurse Practitioner

## 2018-09-21 DIAGNOSIS — I480 Paroxysmal atrial fibrillation: Secondary | ICD-10-CM

## 2018-09-21 MED ORDER — METOPROLOL SUCCINATE ER 25 MG PO TB24: ORAL_TABLET | ORAL | 2 refills | Status: DC

## 2018-09-21 NOTE — Telephone Encounter (Signed)
Pt calling stating that Roderic Palau, NP increased his Metoprolol and this change was not sent into pt's pharmacy. Pt stated that he needs his medication. Please address

## 2018-10-24 ENCOUNTER — Other Ambulatory Visit (HOSPITAL_COMMUNITY): Payer: Self-pay | Admitting: *Deleted

## 2018-10-24 DIAGNOSIS — I480 Paroxysmal atrial fibrillation: Secondary | ICD-10-CM

## 2018-10-24 MED ORDER — DILTIAZEM HCL ER COATED BEADS 180 MG PO CP24: 180.0000 mg | ORAL_CAPSULE | Freq: Every evening | ORAL | 2 refills | Status: DC

## 2018-11-02 DIAGNOSIS — M4800 Spinal stenosis, site unspecified: Secondary | ICD-10-CM | POA: Diagnosis not present

## 2018-11-06 DIAGNOSIS — M4800 Spinal stenosis, site unspecified: Secondary | ICD-10-CM | POA: Diagnosis not present

## 2018-11-07 DIAGNOSIS — M5440 Lumbago with sciatica, unspecified side: Secondary | ICD-10-CM | POA: Diagnosis not present

## 2018-11-07 DIAGNOSIS — M4150 Other secondary scoliosis, site unspecified: Secondary | ICD-10-CM | POA: Diagnosis not present

## 2018-11-08 ENCOUNTER — Ambulatory Visit (INDEPENDENT_AMBULATORY_CARE_PROVIDER_SITE_OTHER): Payer: PPO

## 2018-11-08 ENCOUNTER — Other Ambulatory Visit: Payer: Self-pay

## 2018-11-08 ENCOUNTER — Encounter: Payer: Self-pay | Admitting: Orthopaedic Surgery

## 2018-11-08 ENCOUNTER — Ambulatory Visit (INDEPENDENT_AMBULATORY_CARE_PROVIDER_SITE_OTHER): Payer: PPO | Admitting: Orthopaedic Surgery

## 2018-11-08 DIAGNOSIS — M25552 Pain in left hip: Secondary | ICD-10-CM

## 2018-11-08 DIAGNOSIS — M1611 Unilateral primary osteoarthritis, right hip: Secondary | ICD-10-CM | POA: Diagnosis not present

## 2018-11-08 NOTE — Progress Notes (Signed)
Office Visit Note   Patient: Gary [name]           Date of Birth: 1950/07/21           MRN: [id number] Visit Date: 11/08/2018              Requested by: Gary Neer, MD [address] [place],   [id number] PCP: Gary Neer, MD   Assessment & Plan: Visit Diagnoses:  1. Pain in left hip   2. Unilateral primary osteoarthritis, right hip     Plan: Given the fact that his x-rays show significant arthritis in his left hip and given the fact that he is having significant left hip pain, we are recommending at this point a hip replacement.  I showed him his hip x-rays and went over hip model explained in detail what the surgery involves.  We talked about the risk and benefits of surgery as well as what to expect with interoperative and postoperative course.  I gave him a handout about hip replacement surgery in detail.  He should continue therapy because I think this is helping greatly.  We talked about the possibility of an intra-articular steroid injection at some point.  He was able to take a picture of his x-rays as well.  All question concerns were answered and addressed.  I am happy to talk to him further about this in the future if he does wish to call us and schedule hip replacement surgery which I think would be the next step for him.  Follow-Up Instructions: Return if symptoms worsen or fail to improve.   Orders:  Orders Placed This Encounter  Procedures  . XR HIP UNILAT W OR W/O PELVIS 1V LEFT   No orders of the defined types were placed in this encounter.     Procedures: No procedures performed   Clinical Data: No additional findings.   Subjective: Chief Complaint  Patient presents with  . Left Hip - Pain  Patient is a very pleasant 68 year old gentleman who comes in for evaluation treatment of left hip pain.  He is followed by Dr. Kary [name] neurosurgeon for some spine issues.  He has been through extensive physical therapy which is helped  greatly and this is been through [facility] physical therapy.  He reports a catching sensation in his left hip and pain with certain activities it is in the groin and behind in the sciatic region but this is all hip related as opposed to his back.  This started around last summer.  He says therapy has helped significantly.  He does have a history of atrial fibrillation and has had some ablations.  He is on Eliquis.  He is not a diabetic.  He has had steroid injections in his spine before but not in his left hip.  His left hip pain can at times detrimentally affect his mobility and his quality of life.  It has become frustrating for him that he cannot do some of the activities that he used to be able to do especially with walking long distances. HPI  Review of Systems He currently denies any headache, chest pain, shortness of breath, fever, chills, nausea, vomiting  Objective: Vital Signs: There were no vitals taken for this visit.  Physical Exam He is alert and orient x3 and in no acute distress Ortho Exam Examination of his right hip is normal examination of his left hip shows some pain in the groin with internal and external rotation with  significant stiffness as well with rotation of the left hip. Specialty Comments:  No specialty comments available.  Imaging: Xr Hip Unilat W Or W/o Pelvis 1v Left  Result Date: 11/08/2018 An AP pelvis and lateral left hip shows significant arthritis of the left hip.  There is flattening of the femoral head.  There are large periarticular osteophytes and severe joint space narrowing.  There are sclerotic changes as well.  The right hip appears normal.    PMFS History: Patient Active Problem List   Diagnosis Date Noted  . Unilateral primary osteoarthritis, right hip 11/08/2018  . Persistent atrial fibrillation ([place]) 03/22/2018  . 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 Left hip pain 09/13/2016  . Family history of cardiovascular disease 05/21/2015   . Obesity   . Hyperlipidemia   . A-fib ([place])   . Atrial flutter ([place])   . Insomnia   . Heart disease   . Back pain   . Cardiomyopathy [facility])    Past Medical History:  Diagnosis Date  . Atrial flutter ([place]) 2007   s/p CTI ablation by Dr Gary [name]  . Back pain   . Hyperlipidemia   . Hypertension   . Insomnia   . NICM (nonischemic cardiomyopathy) ([place])    a. 2005 - tachy mediated. normalized after return of NSR.  [name] Obesity   . Paroxysmal atrial fibrillation ([facility])     Family History  Problem Relation Age of Onset  . Heart attack Father   . Hypertension Father   . Hypertension Mother     Past Surgical History:  Procedure Laterality Date  . ATRIAL FIBRILLATION ABLATION N/A 03/22/2018   Procedure: ATRIAL FIBRILLATION ABLATION;  Surgeon: Gary Grayer, MD;  Location: [place] [facility];  Service: Cardiovascular;  Laterality: N/A;  . atrial flutter ablation  06/10/2005   CTI ablation by Dr Gary [name] for atrial flutter  . CARDIOVERSION N/A 07/20/2016   Procedure: CARDIOVERSION;  Surgeon: Gary Breeding, MD;  [facility] ENDOSCOPY;  Service: Cardiovascular;  Laterality: N/A;  . CARDIOVERSION N/A 12/13/2017   Procedure: CARDIOVERSION;  Surgeon: Gary Headings, MD;  Location: [place];  Service: Cardiovascular;  Laterality: N/A;  . CARDIOVERSION N/A 01/30/2018   Procedure: CARDIOVERSION;  Surgeon: Gary Records, MD;  Location: [place];  Service: Cardiovascular;  Laterality: N/A;  . CARDIOVERSION N/A 06/27/2018   Procedure: CARDIOVERSION;  Surgeon: Gary Dresser, MD;  Location: [facility] ENDOSCOPY;  Service: Cardiovascular;  Laterality: N/A;  . CARDIOVERSION N/A 08/16/2018   Procedure: CARDIOVERSION;  Surgeon: Gary Casino, MD;  Location: [place];  Service: Cardiovascular;  Laterality: N/A;  . CARDIOVERSION N/A 08/28/2018   Procedure: CARDIOVERSION;  Surgeon: Gary Fredrickson Wonda Cheng, MD;  Location: [place];  Service: Cardiovascular;  Laterality: N/A;  . NO PAST SURGERIES      Social History   Occupational History  . Not on file  Tobacco Use  . Smoking status: Former Research scientist (life sciences)  . Smokeless tobacco: Never Used  Substance and Sexual Activity  . Alcohol use: Yes    Comment: 3-4 drinks per day  . Drug use: No  . Sexual activity: Yes

## 2018-11-10 DIAGNOSIS — M4800 Spinal stenosis, site unspecified: Secondary | ICD-10-CM | POA: Diagnosis not present

## 2018-11-13 ENCOUNTER — Telehealth: Payer: Self-pay | Admitting: Orthopaedic Surgery

## 2018-11-13 NOTE — Telephone Encounter (Signed)
Patient called. Says he would like to have surgery. His call back number is [phone]

## 2018-11-13 NOTE — Telephone Encounter (Signed)
Let him know that we will work on getting this scheduled and I will give a scheduling sheet to our surgical scheduler.  She will then be in touch with him.

## 2018-11-13 NOTE — Telephone Encounter (Signed)
LMOM for patient of the below message  

## 2018-11-15 NOTE — Telephone Encounter (Signed)
Spoke with patient and scheduled surgery. °

## 2018-11-16 ENCOUNTER — Telehealth: Payer: Self-pay | Admitting: *Deleted

## 2018-11-16 NOTE — Telephone Encounter (Signed)
I have faxed clearance notes to [name] for upcoming appt. I have also faxed to surgeon's office clearance notes as well. I will note [name] appt for surgery clearance. I will remove from the pre op call back pool .

## 2018-11-16 NOTE — Telephone Encounter (Signed)
   [facility] Pre-operative Risk Assessment    Request for surgical clearance:  1. What type of surgery is being performed? LEFT TOTAL HIP ARTHROPLASTY   2. When is this surgery scheduled? TBD   3. What type of clearance is required (medical clearance vs. Pharmacy clearance to hold med vs. Both)? BOTH  4. Are there any medications that need to be held prior to surgery and how long? ELIQUIS; SURGEON ASKING ARE THERE ANY SPECIAL INSTRUCTIONS FOR TIKOSYN   5. Practice name and name of physician performing surgery?  [facility]; DR. Jean Rosenthal   6. What is your office phone number [phone]    7.   What is your office fax number [phone]  8.   Anesthesia type (None, local, MAC, general) ? CHOICE   Julaine Hua 11/16/2018, 3:24 PM  _________________________________________________________________   (provider comments below)

## 2018-11-16 NOTE — Telephone Encounter (Signed)
Pt takes Eliquis for afib with CHADS2VASc score of 2 (age, HTN). Renal function is normal. Underwent cardioversion on 08/28/18. Ok to hold Eliquis for 3 days prior to THA. No special instructions for Tikosyn, pt should continue taking this twice daily.

## 2018-11-16 NOTE — Telephone Encounter (Signed)
See the notes attached to this on clearance for surgery. Thanks

## 2018-11-16 NOTE — Telephone Encounter (Signed)
   [place], MD  Chart reviewed as part of pre-operative protocol coverage. Because of Gary [name] past medical history and time since last visit, he/she will require a follow-up visit in order to better assess preoperative cardiovascular risk. He is scheduled for a follow up visit with [name] on 11/30/2018. Given his hx of AF and more recent Tikosyn therapy, he will require an EKG and visit prior to cardiac clearance.   Pre-op covering staff: - Please contact requesting surgeon's office via preferred method (i.e, phone, fax) to inform them of the appointment on 11/30/18 with [name] -Also, please let [name] know that he will need to address the upcoming surgery during that appointment.    If applicable, this message will also be routed to pharmacy pool and/or primary cardiologist for input on holding anticoagulant/antiplatelet agent as requested below so that this information is available at time of patient's appointment.   Kathyrn Drown, NP  11/16/2018, 4:35 PM

## 2018-11-19 IMAGING — XA Imaging study
1 series · 1 of 1 positions shown · non-contrast
Comparison: none

CLINICAL DATA: Lumbar spinal stenosis.  Left lower extremity pain.

[Series 1: ortho standard · 1 of 1 slices shown]
[im 1/1]
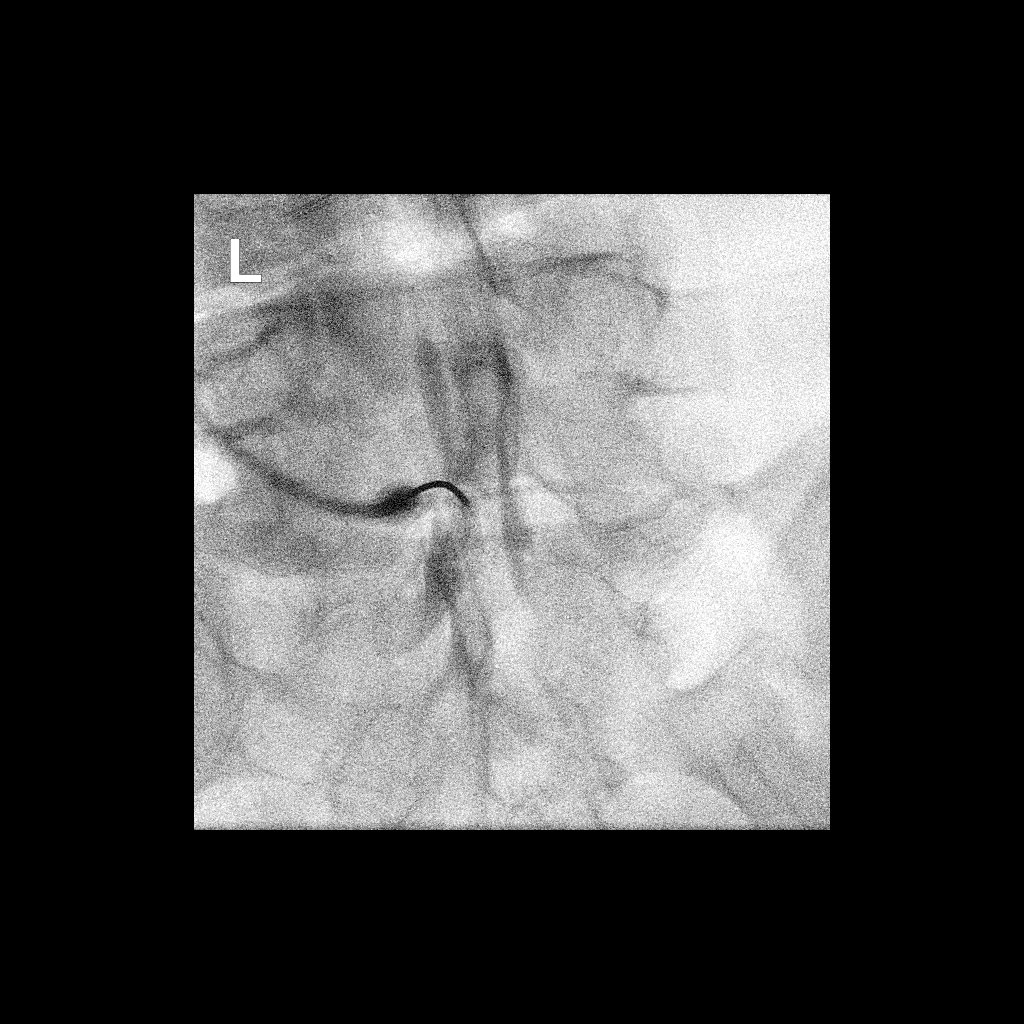

[1 of 1 positions shown; findings below may reference images not displayed]

EXAM:
LUMBAR EPIDURAL INJECTION:

DIAGNOSTIC EPIDURAL INJECTION:

THERAPEUTIC EPIDURAL INJECTION:

PROCEDURE:
The procedure, risks, benefits, and alternatives were explained to
the patient. Questions regarding the procedure were encouraged and
answered. The patient understands and consents to the procedure.The
overlying skin was cleansed and anesthetized.

An interlaminar approach was performed on left at L4-5. Access to
the epidural space could not be achieved due to disc narrowing and
bony spurring such that no interlaminar window could be found
despite a variety of attempted approaches. Ultimately, we moved to
the adjacent left L5-S1 level. From an interlaminar approach, a 20
gauge epidural needle was advanced using loss-of-resistance
technique.

Injection of Isovue-M 200 shows a good epidural pattern with spread
above and below the level of needle placement, primarily on the
left, up to the L4-5 interspace. No vascular opacification is seen.

120mg of Depo-Medrol mixed with 2ml lidocaine 1% were instilled. The
procedure was well-tolerated, and the patient was discharged thirty
minutes following the injection in good condition.

FLUOROSCOPY TIME:  54 seconds; 32 uGym5 DAP

COMPLICATIONS:
None immediate
IMPRESSION: Technically successful epidural injection on the left at L5-S1.

## 2018-11-27 ENCOUNTER — Telehealth: Payer: Self-pay | Admitting: Internal Medicine

## 2018-11-27 NOTE — Telephone Encounter (Signed)
   Primary Cardiologist: Larae Grooms, MD  Chart reviewed as part of pre-operative protocol coverage. Simple dental extractions are considered low risk procedures per guidelines and generally do not require any specific cardiac clearance. It is also generally accepted that for simple extractions and dental cleanings, there is no need to interrupt blood thinner therapy.   SBE prophylaxis is not required for the patient.  I will route this recommendation to the requesting party via Epic fax function and remove from pre-op pool.  Please call with questions.  [place], [place] 11/27/2018, 5:44 PM

## 2018-11-27 NOTE — Telephone Encounter (Signed)
New Message  1. What dental office are you calling from? Dr Feliberto Harts  2. What is your office phone number? [phone]  3. What is your fax number? [phone]  4. What type of procedure is the patient having performed? 1 Simple Extraction  5. What date is procedure scheduled or is the patient there now? TBD (this week hopefully)  (if the patient is at the dentist's office question goes to their cardiologist if he/she is in the office.  If not, question should go to the DOD).   6. What is your question (ex. Antibiotics prior to procedure, holding medication-we need to know how long dentist wants pt to hold med)? Hold Eliquis

## 2018-11-28 ENCOUNTER — Other Ambulatory Visit: Payer: Self-pay

## 2018-11-30 ENCOUNTER — Encounter: Payer: Self-pay | Admitting: Internal Medicine

## 2018-11-30 ENCOUNTER — Other Ambulatory Visit: Payer: Self-pay

## 2018-11-30 ENCOUNTER — Ambulatory Visit: Payer: PPO | Admitting: Internal Medicine

## 2018-11-30 VITALS — BP 94/66 | HR 64 | Ht 73.0 in | Wt 214.3 lb

## 2018-11-30 DIAGNOSIS — Z01810 Encounter for preprocedural cardiovascular examination: Secondary | ICD-10-CM | POA: Diagnosis not present

## 2018-11-30 DIAGNOSIS — I4819 Other persistent atrial fibrillation: Secondary | ICD-10-CM

## 2018-11-30 DIAGNOSIS — I10 Essential (primary) hypertension: Secondary | ICD-10-CM

## 2018-11-30 NOTE — Patient Instructions (Addendum)
Medication Instructions:  Your physician recommends that you continue on your current medications as directed. Please refer to the Current Medication list given to you today.  Labwork: You will get lab work today:  CBC,  BMP and magnesium   Testing/Procedures: None ordered.  Follow-Up:  Your physician wants you to follow-up in: 3 months with Dr. Irish Lack.   Your physician wants you to follow-up in: 6 months with the Afib clinic.   You will receive a reminder letter in the mail two months in advance. If you don't receive a letter, please call our office to schedule the follow-up appointment.   Any Other Special Instructions Will Be Listed Below (If Applicable).  If you need a refill on your cardiac medications before your next appointment, please call your pharmacy.

## 2018-11-30 NOTE — Progress Notes (Signed)
PCP: [name], MD Primary Cardiologist: Dr Irish Lack (overdue to see) Primary EP: Dr Rayann Heman  CC: palpitations  Gary [name] is a 68 y.o. male who presents today for routine electrophysiology followup.  Since last being seen in our clinic, the patient reports doing very well.  He has rare palpitations suggestive of afib, typically lasting only 10-15 minutes.  His primary concern is with hip pain.  He is awaiting hip surgery.  Today, he denies symptoms of chest pain, shortness of breath,  lower extremity edema, dizziness, presyncope, or syncope.  The patient is otherwise without complaint today.   Past Medical History:  Diagnosis Date   Atrial flutter ([place]) 2007   s/p CTI ablation by Dr Lovena Le   Back pain    Hyperlipidemia    Hypertension    Insomnia    NICM (nonischemic cardiomyopathy) ([place])    a. 2005 - tachy mediated. normalized after return of NSR.   Obesity    Paroxysmal atrial fibrillation ([place])    Past Surgical History:  Procedure Laterality Date   ATRIAL FIBRILLATION ABLATION N/A 03/22/2018   Procedure: ATRIAL FIBRILLATION ABLATION;  Surgeon: Thompson Grayer, MD;  Location: [place] [facility];  Service: Cardiovascular;  Laterality: N/A;   atrial flutter ablation  06/10/2005   CTI ablation by Dr Lovena Le for atrial flutter   CARDIOVERSION N/A 07/20/2016   Procedure: CARDIOVERSION;  Surgeon: Minus Breeding, MD;  Location: [facility] ENDOSCOPY;  Service: Cardiovascular;  Laterality: N/A;   CARDIOVERSION N/A 12/13/2017   Procedure: CARDIOVERSION;  Surgeon: Thayer Headings, MD;  Location: [facility] ENDOSCOPY;  Service: Cardiovascular;  Laterality: N/A;   CARDIOVERSION N/A 01/30/2018   Procedure: CARDIOVERSION;  Surgeon: Fay Records, MD;  Location: [facility] ENDOSCOPY;  Service: Cardiovascular;  Laterality: N/A;   CARDIOVERSION N/A 06/27/2018   Procedure: CARDIOVERSION;  Surgeon: Buford Dresser, MD;  Location: [facility] ENDOSCOPY;  Service: Cardiovascular;  Laterality: N/A;    CARDIOVERSION N/A 08/16/2018   Procedure: CARDIOVERSION;  Surgeon: [name], MD;  Location: [place];  Service: Cardiovascular;  Laterality: N/A;   CARDIOVERSION N/A 08/28/2018   Procedure: CARDIOVERSION;  Surgeon: Thayer Headings, MD;  Location: [place];  Service: Cardiovascular;  Laterality: N/A;   NO PAST SURGERIES      ROS- all systems are reviewed and negatives except as per HPI above  Current Outpatient Medications  Medication Sig Dispense Refill   acetaminophen (TYLENOL) 500 MG tablet Take 1,000 mg by mouth daily as needed (nerve pain).      amitriptyline (ELAVIL) 50 MG tablet Take 50-75 mg by mouth at bedtime.      atorvastatin (LIPITOR) 10 MG tablet Take 10 mg by mouth every evening.      calcium carbonate (TUMS - DOSED IN MG ELEMENTAL CALCIUM) 500 MG chewable tablet Chew 2 tablets by mouth daily as needed for indigestion or heartburn.     diltiazem (CARDIZEM CD) 180 MG 24 hr capsule Take 1 capsule (180 mg total) by mouth every evening. 90 capsule 2   dofetilide (TIKOSYN) 500 MCG capsule Take 1 capsule (500 mcg total) by mouth 2 (two) times daily. 180 capsule 2   ELIQUIS 5 MG TABS tablet TAKE 1 TABLET BY MOUTH TWICE DAILY (Patient taking differently: Take 5 mg by mouth 2 (two) times daily. ) 180 tablet 3   famotidine (PEPCID) 20 MG tablet Take 20 mg by mouth daily as needed for heartburn or indigestion.     metoprolol succinate (TOPROL-XL) 25 MG 24 hr tablet Take 1 tablet  in the AM and 1/2 tablet in the PM 135 tablet 2   Multiple Vitamin (MULTIVITAMIN WITH MINERALS) TABS tablet Take 1 tablet by mouth daily.     Omega-3 Fatty Acids (FISH OIL) 1200 MG CAPS Take 1,200 mg by mouth 2 (two) times daily.      sildenafil (VIAGRA) 50 MG tablet Take 50 mg by mouth daily as needed for erectile dysfunction.     Simethicone (GAS-X PO) Take 2 tablets by mouth daily as needed (gas).     valACYclovir (VALTREX) 1000 MG tablet Take 2,000 mg by mouth every 12 (twelve)  hours as needed (FOR COLD SORES). Max 2 doses     vitamin C (ASCORBIC ACID) 500 MG tablet Take 500 mg by mouth daily as needed (immune support).     vitamin E (VITAMIN E) 400 UNIT capsule Take 400 Units by mouth daily.     zolpidem (AMBIEN) 10 MG tablet Take 10 mg by mouth at bedtime as needed for sleep.     No current facility-administered medications for this visit.     Physical Exam: Vitals:   11/30/18 0913  BP: 94/66  Pulse: 64  SpO2: 97%  Weight: 214 lb 4.8 oz (97.2 kg)  Height: 6\' 1"  (1.854 m)    GEN- The patient is well appearing, alert and oriented x 3 today.   Head- normocephalic, atraumatic Eyes-  Sclera clear, conjunctiva pink Ears- hearing intact Oropharynx- clear Lungs-   normal work of breathing Heart- Regular rate and rhythm  GI- soft  Extremities- no clubbing, cyanosis, or edema  Wt Readings from Last 3 Encounters:  11/30/18 214 lb 4.8 oz (97.2 kg)  09/20/18 210 lb (95.3 kg)  08/28/18 214 lb 6.5 oz (97.3 kg)    EKG tracing ordered today is personally reviewed and shows sinus rhythm with PACs, first degree AV  Block, QTc 398 msec  Assessment and Plan:  1. Persistent afib S/p PVI 02/2018 Now on tikosyn and doing reasonably well On eliquis for chads2vasc score of 2 Bmet, mg, CBC  Today We discussed ILR for afib management post ablation.  He will consider this if AF burden increases.  I do not think that short term/ 30 monitors would be beneficial in the future.  2. HTN Bp is low today He reports no symptoms of low BP No change required today  3. Overweight lifestyle modification encouraged  4. 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 preop Ok to proceed with hip surgery without further CV testing Hold eliquis 24-48 hours prior to surgery and resume as soon as able post operatively  Follow-up with Dr Irish Lack in 3 months AF clinic in 6 months  Thompson Grayer MD, [facility] 11/30/2018 9:25 AM

## 2018-12-01 LAB — CBC WITH DIFFERENTIAL/PLATELET
Basophils Absolute: 0 10*3/uL (ref 0.0–0.2)
Basos: 1 %
EOS (ABSOLUTE): 0.1 10*3/uL (ref 0.0–0.4)
Eos: 2 %
Hematocrit: 47.3 % (ref 37.5–51.0)
Hemoglobin: 16 g/dL (ref 13.0–17.7)
Immature Grans (Abs): 0 10*3/uL (ref 0.0–0.1)
Immature Granulocytes: 0 %
Lymphocytes Absolute: 2.6 10*3/uL (ref 0.7–3.1)
Lymphs: 42 %
MCH: 31.7 pg (ref 26.6–33.0)
MCHC: 33.8 g/dL (ref 31.5–35.7)
MCV: 94 fL (ref 79–97)
Monocytes Absolute: 0.6 10*3/uL (ref 0.1–0.9)
Monocytes: 9 %
Neutrophils Absolute: 3 10*3/uL (ref 1.4–7.0)
Neutrophils: 46 %
Platelets: 196 10*3/uL (ref 150–450)
RBC: 5.04 x10E6/uL (ref 4.14–5.80)
RDW: 12.9 % (ref 11.6–15.4)
WBC: 6.3 10*3/uL (ref 3.4–10.8)

## 2018-12-01 LAB — BASIC METABOLIC PANEL
BUN/Creatinine Ratio: 14 (ref 10–24)
BUN: 14 mg/dL (ref 8–27)
CO2: 27 mmol/L (ref 20–29)
Calcium: 9.5 mg/dL (ref 8.6–10.2)
Chloride: 100 mmol/L (ref 96–106)
Creatinine, Ser: 1.02 mg/dL (ref 0.76–1.27)
GFR calc Af Amer: 86 mL/min/{1.73_m2} (ref >59)
GFR calc non Af Amer: 75 mL/min/{1.73_m2} (ref >59)
Glucose: 102 mg/dL — ABNORMAL HIGH (ref 65–99)
Potassium: 4.8 mmol/L (ref 3.5–5.2)
Sodium: 139 mmol/L (ref 134–144)

## 2018-12-01 LAB — MAGNESIUM: Magnesium: 2.4 mg/dL — ABNORMAL HIGH (ref 1.6–2.3)

## 2018-12-04 ENCOUNTER — Encounter: Payer: Self-pay | Admitting: Orthopaedic Surgery

## 2018-12-05 ENCOUNTER — Other Ambulatory Visit: Payer: Self-pay | Admitting: Physician Assistant

## 2018-12-06 ENCOUNTER — Encounter (HOSPITAL_COMMUNITY)
Admission: RE | Admit: 2018-12-06 | Discharge: 2018-12-06 | Disposition: A | Discharge status: Home / Self Care | Payer: PPO | Source: Ambulatory Visit | Attending: Orthopaedic Surgery | Admitting: Orthopaedic Surgery

## 2018-12-06 ENCOUNTER — Encounter (HOSPITAL_COMMUNITY): Payer: Self-pay

## 2018-12-06 ENCOUNTER — Other Ambulatory Visit: Payer: Self-pay

## 2018-12-06 DIAGNOSIS — M4800 Spinal stenosis, site unspecified: Secondary | ICD-10-CM | POA: Diagnosis not present

## 2018-12-06 DIAGNOSIS — I4891 Unspecified atrial fibrillation: Secondary | ICD-10-CM | POA: Insufficient documentation

## 2018-12-06 DIAGNOSIS — M797 Fibromyalgia: Secondary | ICD-10-CM | POA: Diagnosis not present

## 2018-12-06 DIAGNOSIS — M1612 Unilateral primary osteoarthritis, left hip: Secondary | ICD-10-CM | POA: Insufficient documentation

## 2018-12-06 DIAGNOSIS — I428 Other cardiomyopathies: Secondary | ICD-10-CM | POA: Diagnosis not present

## 2018-12-06 DIAGNOSIS — Z87891 Personal history of nicotine dependence: Secondary | ICD-10-CM | POA: Insufficient documentation

## 2018-12-06 DIAGNOSIS — I10 Essential (primary) hypertension: Secondary | ICD-10-CM | POA: Diagnosis not present

## 2018-12-06 DIAGNOSIS — Z79899 Other long term (current) drug therapy: Secondary | ICD-10-CM | POA: Insufficient documentation

## 2018-12-06 DIAGNOSIS — K219 Gastro-esophageal reflux disease without esophagitis: Secondary | ICD-10-CM | POA: Insufficient documentation

## 2018-12-06 DIAGNOSIS — Z01818 Encounter for other preprocedural examination: Secondary | ICD-10-CM | POA: Diagnosis not present

## 2018-12-06 DIAGNOSIS — I4892 Unspecified atrial flutter: Secondary | ICD-10-CM | POA: Insufficient documentation

## 2018-12-06 DIAGNOSIS — Z7901 Long term (current) use of anticoagulants: Secondary | ICD-10-CM | POA: Insufficient documentation

## 2018-12-06 HISTORY — DX: Fibromyalgia: M79.7

## 2018-12-06 HISTORY — DX: Spinal stenosis, site unspecified: M48.00

## 2018-12-06 HISTORY — DX: Unspecified osteoarthritis, unspecified site: M19.90

## 2018-12-06 HISTORY — DX: Pneumonia, unspecified organism: J18.9

## 2018-12-06 HISTORY — DX: Gastro-esophageal reflux disease without esophagitis: K21.9

## 2018-12-06 LAB — SURGICAL PCR SCREEN
MRSA, PCR: NEGATIVE
Staphylococcus aureus: NEGATIVE

## 2018-12-06 NOTE — Progress Notes (Addendum)
[id number] - [place], [place] [address] AT [place] [address] Phone: [phone] Fax: [phone]  [id number] [place], [place] - [place] AT [place] [place] [address] Phone: [phone] Fax: [phone]      Your procedure is scheduled on Tuesday, 12/12/18.  Report to [facility] Main Entrance "A" at 12:05 P.M., and check in at the Admitting office.  Call this number if you have problems the morning of surgery:  [phone]  Call [phone] if you have any questions prior to your surgery date Monday-Friday 8am-4pm    Remember:  Do not eat after midnight the night before your surgery - Monday  You may drink clear liquids until 11:10 am the morning of your surgery-Tuesday.   Clear liquids allowed are: Water, Non-Citrus Juices (without pulp), Carbonated Beverages, Clear Tea, Black Coffee Only, and Gatorade   Please complete your PRE-SURGERY ENSURE that was provided to you by 11:10 am the morning of surgery.  Please, if able, drink it in one setting. DO NOT SIP.   Take these medicines the morning of surgery with A SIP OF WATER: acetaminophen (TYLENOL)  If needed dofetilide (TIKOSYN) famotidine (PEPCID) if needed HYDROcodone-acetaminophen (NORCO/VICODIN) if needed metoprolol succinate (TOPROL-XL)  valACYclovir (VALTREX) if needed  7 days prior to surgery STOP taking any Aspirin (unless otherwise instructed by your surgeon), Aleve, Naproxen, Ibuprofen, Motrin, Advil, Goody's, BC's, all herbal medications, fish oil, and all vitamins.    The Morning of Surgery  Do not wear jewelry, make-up or nail polish.  Do not wear lotions, powders,colognes, or deodorant  Do not shave 48 hours prior to surgery.  Men may shave face and neck.  Do not bring valuables to the hospital.  [facility] is not responsible for any  belongings or valuables.  If you are a smoker, DO NOT Smoke 24 hours prior to surgery  Remember that you must have someone to transport you home after your surgery, and remain with you for 24 hours if you are discharged the same day.  Contacts, glasses, hearing aids, dentures or bridgework may not be worn into surgery.   Leave your suitcase in the car.  After surgery it may be brought to your room.  For patients admitted to the hospital, discharge time will be determined by your treatment team.  Patients discharged the day of surgery will not be allowed to drive home.    Special instructions:   [place]- Preparing For Surgery  Before surgery, you can play an important role. Because skin is not sterile, your skin needs to be as free of germs as possible. You can reduce the number of germs on your skin by washing with CHG (chlorahexidine gluconate) Soap before surgery.  CHG is an antiseptic cleaner which kills germs and bonds with the skin to continue killing germs even after washing.    Oral Hygiene is also important to reduce your risk of infection.  Remember - BRUSH YOUR TEETH THE MORNING OF SURGERY WITH YOUR REGULAR TOOTHPASTE  Please do not use if you have an allergy to CHG or antibacterial soaps. If your skin becomes reddened/irritated stop using the CHG.  Do not shave (including legs and underarms) for at least 48 hours prior to first CHG shower. It is OK to shave your face.  Please follow these instructions carefully.   1. Shower the [employer] and the  MORNING OF SURGERY-Tues with CHG Soap.   2. If you chose to wash your hair, wash your hair first as usual with your normal shampoo.  3. After you shampoo, rinse your hair and body thoroughly to remove the shampoo.  4. Use CHG as you would any other liquid soap. You can apply CHG directly to the skin and wash gently with a scrungie or a clean washcloth.   5. Apply the CHG Soap to your body ONLY FROM THE NECK  DOWN.  Do not use on open wounds or open sores. Avoid contact with your eyes, ears, mouth and genitals (private parts). Wash Face and genitals (private parts)  with your normal soap.   6. Wash thoroughly, paying special attention to the area where your surgery will be performed.  7. Thoroughly rinse your body with warm water from the neck down.  8. DO NOT shower/wash with your normal soap after using and rinsing off the CHG Soap.  9. Pat yourself dry with a CLEAN TOWEL.  10. Wear CLEAN PAJAMAS to bed the night before surgery, wear comfortable clothes the morning of surgery  11. Place CLEAN SHEETS on your bed the night of your first shower and DO NOT SLEEP WITH PETS.    Day of Surgery:  Do not apply any deodorants/lotions. Please shower the morning of surgery with the CHG soap  Please wear clean clothes to the hospital/surgery center.   Remember to brush your teeth WITH YOUR REGULAR TOOTHPASTE.   Please read over the following fact sheets that you were given.

## 2018-12-06 NOTE — Progress Notes (Signed)
Patient denies shortness of breath, fever, cough and chest pain.  PCP - Dr Serita Grammes Cardiologist - Dr Thompson Grayer  Chest x-ray - 04/06/18 - 2 view EKG - 11/30/18 Stress Test - 02/10/10 ECHO - 11/24/17 Cardiac Cath - denies  Blood Thinner Instructions:  Hold Eliquis 24/48 hrs prior to surgery per MD.  Last dose will be on Sat., 12/09/18  ERAS:Clears til 11:10 am DOS, Ensure drink given.  Anesthesia review: Yes  Coronavirus Screening Have you experienced the following symptoms:  Cough yes/no: No Fever (>100.61F)  yes/no: No Runny nose yes/no: No Sore throat yes/no: No Difficulty breathing/shortness of breath  yes/no: No  Have you traveled in the last 14 days and where? yes/no: No  Patient verbalized understanding of instructions that were given to them at the PAT appointment.

## 2018-12-07 NOTE — Progress Notes (Addendum)
Anesthesia Chart Review:  Case: [id number] Date/Time: 12/12/18 1354   Procedure: LEFT TOTAL HIP ARTHROPLASTY ANTERIOR APPROACH (Left Hip)   Anesthesia type: Choice   Pre-op diagnosis: osteoarthritis left hip   Location: [facility] OR ROOM 04 / [place] [facility]   Surgeon: Mcarthur Rossetti, MD      DISCUSSION: Patient is a 68 year old male scheduled for the above procedure.  History includes former smoker (quit 2004), atrial flutter/fib (s/p RF catheter ablation 06/10/05, afib ablation 03/22/18; multiple DCCV since 2005, most recent s/p DCCV 08/28/18, 08/16/18, 06/27/18; on Tikosyn and Eliquis), non-ischemic cardiomyopathy (tachy-mediated; EF normalized after afib-->NSR), GERD, spinal stenosis.    Last evaluation with [name] 11/30/18. Patient maintaining SR, but with PACs and reports of rare palpitations lasting 10-15 minutes. Patient will monitor symptoms, but would consider ILR (loop recorder) in the future if afib burden increases. In regards to upcoming surgery, [name] wrote, "Ok to proceed with hip surgery without further CV testing Hold eliquis 24-48 hours prior to surgery and resume as soon as able post operatively".  Plan 3 month follow-up with Dr. Irish Lack and 6 month follow-up with EP.  I have sent a message to [name] inquiring if Mr. Tack can hold Eliquis for 72 hours so spinal anesthesia can be considered--if risk of holding longer is felt to high or patient declines spinal anesthesia then could be considered for general anesthesia. Will leave chart for follow-up response from [name].  Presurgical COVID-19 test is scheduled for 12/08/18.   ADDENDUM 12/08/18 3:36 PM: Response regarding holding Eliquis for 72 hours received from [facility]. My message was routed to Melina Copa, PA-C and [place], [place], [place]. CHADS2VASc score is 2, so given permission to hold Eliquis for 3 days (72 hours). Patient takes Eliquis at 9:30 AM and 9:30 PM. His surgery is around 2:00 PM on 12/12/18, so I advised  him to take last Eliquis on 12/09/18 9:30 AM.  He noted that Dr. Ninfa Linden felt surgery would likely be done with spinal anesthesia. Patient seems open to this, but wanted to make sure the anesthesiologist was aware of moderate to severe spinal stenosis L4-5 on 2019 MRI--this is followed by Dr. Saintclair Halsted, and so far patient has been able to get fairly good relief of his symptoms with PT. Still has occasional pain in his left shin region and sometimes with lying flat.   Assigned anesthesiologist to discuss anesthesia plan on the day of surgery.   VS: BP 126/63   Pulse 77   Temp 37 C   Resp 18   Ht 6\' 1"  (1.854 m)   Wt 97.7 kg   SpO2 99%   BMI 28.41 kg/m     PROVIDERS: [name], MD is PCP  Larae Grooms, MD is primary cardiologist Thompson Grayer, MD is EP cardiologist Kary Kos, MD is neurosurgeon  LABS: Labs from 11/30/18 (ordered by [name]) reviewed and results included:  Lab Results  Component Value Date   WBC 6.3 11/30/2018   HGB 16.0 11/30/2018   HCT 47.3 11/30/2018   PLT 196 11/30/2018   GLUCOSE 102 (H) 11/30/2018   NA 139 11/30/2018   K 4.8 11/30/2018   CL 100 11/30/2018   CREATININE 1.02 11/30/2018   BUN 14 11/30/2018   CO2 27 11/30/2018  Mg 2.4     IMAGES: CXR 04/06/18: FINDINGS: - The cardiomediastinal silhouette is unremarkable. - Mild LEFT basilar scarring again noted. - There is no evidence of focal airspace disease, pulmonary edema, suspicious pulmonary nodule/mass,  pleural effusion, or pneumothorax. - No acute bony abnormalities are identified. IMPRESSION: No active cardiopulmonary disease.  MRI L-spine 10/01/17: IMPRESSION: 1. Progressive lower lumbar disc and facet degeneration, worst at L4-5 where there is moderate to severe spinal stenosis and moderate bilateral neural foraminal stenosis. 2. Mild-to-moderate left neural foraminal stenosis at L5-S1.   EKG: 11/30/18: SR with first degree AV block. PACs.    CV: EP Study  03/22/18: PROCEDURES: 1. Comprehensive electrophysiologic study. 2. Coronary sinus pacing and recording. 3. Three-dimensional mapping of atrial fibrillation (with additional mapping and ablation within the left atrium due to persistence of afib) 4. Ablation of atrial fibrillation (with additional mapping and ablation within the left atrium due to persistence of afib) 5. Intracardiac echocardiography. 6. Transseptal puncture of an intact septum. 7. Arrhythmia induction with pacing 8. External cardioversion.   CT Coronary with FFR 03/17/18: IMPRESSION: 1.  No LA appendage thrombus noted. 2.  Pulmonary veins as noted above. 3. Coronary artery calcium score 489 Agatston units. This places the patient in the 74th percentile for age and gender, suggesting intermediate risk for cardiac events. 4. Extensive plaque, possible moderate mid RCA stenosis. Wiill send for FFR to confirm.     ETT 02/04/17:  Blood pressure demonstrated a normal response to exercise.  There was no ST segment deviation noted during stress.  No T wave inversion was noted during stress. Mildly impaired exercise tolerance, possibly due to deconditioning. Otherwise, normal ECG stress test.   Echo 11/24/17: Study Conclusions - Left ventricle: The cavity size was at the upper limits of   normal. Systolic function was normal. The estimated ejection   fraction was in the range of 55% to 60%. Wall motion was normal;   there were no regional wall motion abnormalities. The study was   not technically sufficient to allow evaluation of LV diastolic   dysfunction due to atrial fibrillation. - Aortic valve: Poorly visualized. Trileaflet; normal thickness,   mildly calcified leaflets. - Mitral valve: Calcified annulus. There was trivial regurgitation. - Left atrium: The atrium was mildly dilated. - Tricuspid valve: There was trivial regurgitation. (Comparison EF 50-55% 07/13/16)   Cardiac cath 03/01/03: Impression:  No  coronary disease noted. EF 35-40%. Nonischemic cardiomyopathy, most likely related to atrial flutter with right ventricular response.    Past Medical History:  Diagnosis Date  . Arthritis   . Atrial flutter ([facility]) 2007   s/p CTI ablation by Dr Lovena Le  . Back pain    resolved per pt 12/06/18  . Fibromyalgia   . GERD (gastroesophageal reflux disease)   . Hyperlipidemia   . Hypertension   . Insomnia    occasional per pt 12/06/18  . NICM (nonischemic cardiomyopathy) ([facility])    a. 2005 - tachy mediated. normalized after return of NSR.  [name] Obesity   . Paroxysmal atrial fibrillation ([facility])   . Pneumonia    x 1  . Spinal stenosis     Past Surgical History:  Procedure Laterality Date  . ATRIAL FIBRILLATION ABLATION N/A 03/22/2018   Procedure: ATRIAL FIBRILLATION ABLATION;  Surgeon: Thompson Grayer, MD;  Location: [facility];  Service: Cardiovascular;  Laterality: N/A;  . atrial flutter ablation  06/10/2005   CTI ablation by Dr Lovena Le for atrial flutter  . CARDIOVERSION N/A 07/20/2016   Procedure: CARDIOVERSION;  Surgeon: Minus Breeding, MD;  Location: [facility] ENDOSCOPY;  Service: Cardiovascular;  Laterality: N/A;  . CARDIOVERSION N/A 12/13/2017   Procedure: CARDIOVERSION;  Surgeon: Thayer Headings, MD;  Location: [facility];  Service: Cardiovascular;  Laterality: N/A;  . CARDIOVERSION N/A 01/30/2018   Procedure: CARDIOVERSION;  Surgeon: Fay Records, MD;  Location: [facility];  Service: Cardiovascular;  Laterality: N/A;  . CARDIOVERSION N/A 06/27/2018   Procedure: CARDIOVERSION;  Surgeon: Buford Dresser, MD;  Location: [facility] ENDOSCOPY;  Service: Cardiovascular;  Laterality: N/A;  . CARDIOVERSION N/A 08/16/2018   Procedure: CARDIOVERSION;  Surgeon: [name], MD;  Location: [facility];  Service: Cardiovascular;  Laterality: N/A;  . CARDIOVERSION N/A 08/28/2018   Procedure: CARDIOVERSION;  Surgeon: Thayer Headings, MD;  Location: [facility];  Service: Cardiovascular;   Laterality: N/A;  . COLONOSCOPY    . EYE SURGERY Bilateral 2019   cataracts removed  . WISDOM TOOTH EXTRACTION      MEDICATIONS: . acetaminophen (TYLENOL) 500 MG tablet  . amitriptyline (ELAVIL) 50 MG tablet  . atorvastatin (LIPITOR) 10 MG tablet  . calcium carbonate (TUMS - DOSED IN MG ELEMENTAL CALCIUM) 500 MG chewable tablet  . diltiazem (CARDIZEM CD) 180 MG 24 hr capsule  . dofetilide (TIKOSYN) 500 MCG capsule  . ELIQUIS 5 MG TABS tablet  . famotidine (PEPCID) 20 MG tablet  . HYDROcodone-acetaminophen (NORCO/VICODIN) 5-325 MG tablet  . metoprolol succinate (TOPROL-XL) 25 MG 24 hr tablet  . Multiple Vitamin (MULTIVITAMIN WITH MINERALS) TABS tablet  . Omega-3 Fatty Acids (FISH OIL) 1200 MG CAPS  . sildenafil (VIAGRA) 50 MG tablet  . Simethicone (GAS-X PO)  . valACYclovir (VALTREX) 1000 MG tablet  . vitamin C (ASCORBIC ACID) 500 MG tablet  . vitamin E (VITAMIN E) 400 UNIT capsule  . zolpidem (AMBIEN) 10 MG tablet   No current facility-administered medications for this encounter.     Myra Gianotti, PA-C Surgical Short Stay/Anesthesiology [facility] Phone [phone] [facility] Phone [phone] 12/07/2018 5:59 PM

## 2018-12-08 ENCOUNTER — Other Ambulatory Visit (HOSPITAL_COMMUNITY)
Admission: RE | Admit: 2018-12-08 | Discharge: 2018-12-08 | Disposition: A | Discharge status: Home / Self Care | Payer: PPO | Source: Ambulatory Visit | Attending: Orthopaedic Surgery | Admitting: Orthopaedic Surgery

## 2018-12-08 DIAGNOSIS — Z20828 Contact with and (suspected) exposure to other viral communicable diseases: Secondary | ICD-10-CM | POA: Diagnosis not present

## 2018-12-08 DIAGNOSIS — Z01812 Encounter for preprocedural laboratory examination: Secondary | ICD-10-CM | POA: Diagnosis not present

## 2018-12-08 NOTE — Telephone Encounter (Addendum)
   Primary Cardiologist: Larae Grooms, MD  EP: [name]  Patient saw [name] 11/30/18 who cleared him for hip surgery. His note states "Hold eliquis 24-48 hours prior to surgery and resume as soon as able post operatively." Per phone note below, surgery team requesting to hold for 72 hours. Will route to [name] and pharm team for ASAP input (surgery date 12/12/18).  Please route response to P CV DIV PREOP (the pre-op pool). Thank you.  Charlie Pitter, PA-C 12/08/2018, 10:23 AM

## 2018-12-08 NOTE — Telephone Encounter (Signed)
I s/w the Anesthesia Dept an informed of Fuller Canada, RPH recommendations to Eliquis 3 days prior, CHADSVASc score is 2. I have asked for a fax # so that I may fax to [facility], fax # [phone]. . I will remove this from the pre op call back pool.

## 2018-12-08 NOTE — Anesthesia Preprocedure Evaluation (Addendum)
Anesthesia Evaluation  Patient identified by MRN, date of birth, ID band Patient awake    Reviewed: Allergy & Precautions, NPO status , Patient's Chart, lab work & pertinent test results, reviewed documented beta blocker date and time   History of Anesthesia Complications Negative for: history of anesthetic complications  Airway Mallampati: II  TM Distance: >3 FB Neck ROM: Full    Dental   Pulmonary former smoker,    Pulmonary exam normal        Cardiovascular hypertension, Pt. on home beta blockers and Pt. on medications Normal cardiovascular exam+ dysrhythmias Atrial Fibrillation    Last evaluation with [name] 11/30/18. Patient maintaining SR, but with PACs and reports of rare palpitations lasting 10-15 minutes. Patient will monitor symptoms, but would consider ILR (loop recorder) in the future if afib burden increases. In regards to upcoming surgery, [name] wrote, "Ok to proceed with hip surgery without further CV testing."  Response regarding holding Eliquis for 72 hours received from [facility]. My message was routed to Melina Copa, PA-C and [place], [place], [place]. CHADS2VASc score is 2, so given permission to hold Eliquis for 3 days (72 hours). Patient takes Eliquis at 9:30 AM and 9:30 PM. His surgery is around 2:00 PM on 12/12/18, so I advised him to take last Eliquis on 12/09/18 9:30 AM.      Neuro/Psych  Spinal stenosis  negative psych ROS   GI/Hepatic Neg liver ROS, GERD  Controlled and Medicated,  Endo/Other  negative endocrine ROS  Renal/GU negative Renal ROS     Musculoskeletal  (+) Arthritis , Fibromyalgia -  Abdominal   Peds  Hematology  Last dose of eliquis >72 hr ago Plt 196k    Anesthesia Other Findings   Reproductive/Obstetrics                           Anesthesia Physical Anesthesia Plan  ASA: III  Anesthesia Plan: Spinal   Post-op Pain Management:     Induction:   PONV Risk Score and Plan: 1 and Treatment may vary due to age or medical condition and Propofol infusion  Airway Management Planned: Natural Airway and Simple Face Mask  Additional Equipment: None  Intra-op Plan:   Post-operative Plan:   Informed Consent: I have reviewed the patients History and Physical, chart, labs and discussed the procedure including the risks, benefits and alternatives for the proposed anesthesia with the patient or authorized representative who has indicated his/her understanding and acceptance.       Plan Discussed with: CRNA and Anesthesiologist  Anesthesia Plan Comments: (   )       Anesthesia Quick Evaluation

## 2018-12-08 NOTE — Telephone Encounter (Signed)
Yes pt can hold Eliquis for 3 days prior, his CHADS2VASc score is 2.

## 2018-12-08 NOTE — Telephone Encounter (Signed)
Thank you! Callback staff, can you please call [facility] Anesthesia team as requested below and relay updated recommendation from pharmacy team? Thank you. Will remove from preop APP box otherwise since clearance already addressed. Dayna Dunn PA-C

## 2018-12-08 NOTE — Telephone Encounter (Addendum)
°  [place] Anesthesia team would like to know if Eliquis can be held for 72 hours prior. Please call [phone] , ask for PA, [place]  Surgery date [id number]

## 2018-12-10 LAB — NOVEL CORONAVIRUS, NAA (HOSP ORDER, SEND-OUT TO REF LAB; TAT 18-24 HRS): SARS-CoV-2, NAA: NOT DETECTED

## 2018-12-12 ENCOUNTER — Ambulatory Visit (HOSPITAL_COMMUNITY): Payer: PPO | Admitting: Anesthesiology

## 2018-12-12 ENCOUNTER — Inpatient Hospital Stay (HOSPITAL_COMMUNITY)
Admission: RE | Admit: 2018-12-12 | Discharge: 2018-12-15 | DRG: 470 | Disposition: A | Discharge status: Home Health | Payer: PPO | Attending: Orthopaedic Surgery | Admitting: Orthopaedic Surgery

## 2018-12-12 ENCOUNTER — Encounter (HOSPITAL_COMMUNITY): Payer: Self-pay | Admitting: *Deleted

## 2018-12-12 ENCOUNTER — Ambulatory Visit (HOSPITAL_COMMUNITY): Payer: PPO | Admitting: Vascular Surgery

## 2018-12-12 ENCOUNTER — Other Ambulatory Visit: Payer: Self-pay

## 2018-12-12 ENCOUNTER — Observation Stay (HOSPITAL_COMMUNITY): Payer: PPO

## 2018-12-12 ENCOUNTER — Ambulatory Visit (HOSPITAL_COMMUNITY): Payer: PPO

## 2018-12-12 ENCOUNTER — Encounter (HOSPITAL_COMMUNITY)
Admission: RE | Disposition: A | Discharge status: Home Health | Payer: Self-pay | Source: Home / Self Care | Attending: Orthopaedic Surgery

## 2018-12-12 DIAGNOSIS — Z880 Allergy status to penicillin: Secondary | ICD-10-CM

## 2018-12-12 DIAGNOSIS — K219 Gastro-esophageal reflux disease without esophagitis: Secondary | ICD-10-CM | POA: Diagnosis present

## 2018-12-12 DIAGNOSIS — I480 Paroxysmal atrial fibrillation: Secondary | ICD-10-CM | POA: Diagnosis not present

## 2018-12-12 DIAGNOSIS — Z96642 Presence of left artificial hip joint: Secondary | ICD-10-CM | POA: Diagnosis not present

## 2018-12-12 DIAGNOSIS — M797 Fibromyalgia: Secondary | ICD-10-CM | POA: Diagnosis present

## 2018-12-12 DIAGNOSIS — E785 Hyperlipidemia, unspecified: Secondary | ICD-10-CM | POA: Diagnosis present

## 2018-12-12 DIAGNOSIS — M1612 Unilateral primary osteoarthritis, left hip: Secondary | ICD-10-CM | POA: Diagnosis present

## 2018-12-12 DIAGNOSIS — I10 Essential (primary) hypertension: Secondary | ICD-10-CM | POA: Diagnosis present

## 2018-12-12 DIAGNOSIS — Z87891 Personal history of nicotine dependence: Secondary | ICD-10-CM

## 2018-12-12 DIAGNOSIS — Z79899 Other long term (current) drug therapy: Secondary | ICD-10-CM | POA: Diagnosis not present

## 2018-12-12 DIAGNOSIS — Z419 Encounter for procedure for purposes other than remedying health state, unspecified: Secondary | ICD-10-CM

## 2018-12-12 DIAGNOSIS — I4819 Other persistent atrial fibrillation: Secondary | ICD-10-CM | POA: Diagnosis present

## 2018-12-12 DIAGNOSIS — Z7901 Long term (current) use of anticoagulants: Secondary | ICD-10-CM | POA: Diagnosis not present

## 2018-12-12 DIAGNOSIS — M4800 Spinal stenosis, site unspecified: Secondary | ICD-10-CM | POA: Diagnosis present

## 2018-12-12 DIAGNOSIS — Z8249 Family history of ischemic heart disease and other diseases of the circulatory system: Secondary | ICD-10-CM | POA: Diagnosis not present

## 2018-12-12 DIAGNOSIS — Z471 Aftercare following joint replacement surgery: Secondary | ICD-10-CM | POA: Diagnosis not present

## 2018-12-12 HISTORY — PX: TOTAL HIP ARTHROPLASTY: SHX124

## 2018-12-12 LAB — PROTIME-INR
INR: 1.1 (ref 0.8–1.2)
Prothrombin Time: 14.2 seconds (ref 11.4–15.2)

## 2018-12-12 SURGERY — ARTHROPLASTY, HIP, TOTAL, ANTERIOR APPROACH
Anesthesia: Spinal | Site: Hip | Laterality: Left

## 2018-12-12 MED ORDER — OXYCODONE HCL 5 MG PO TABS
5.0000 mg | ORAL_TABLET | ORAL | Status: DC | PRN
  Administered 2018-12-12 – 2018-12-13 (×2): 10 mg via ORAL
  Administered 2018-12-13: 5 mg via ORAL
  Administered 2018-12-13 – 2018-12-14 (×2): 10 mg via ORAL
  Administered 2018-12-14: 5 mg via ORAL
  Filled 2018-12-12 (×3): qty 2
  Filled 2018-12-12: qty 1
  Filled 2018-12-12: qty 2
  Filled 2018-12-12: qty 1

## 2018-12-12 MED ORDER — METOPROLOL SUCCINATE ER 25 MG PO TB24
12.5000 mg | ORAL_TABLET | Freq: Every day | ORAL | Status: DC
  Administered 2018-12-12 – 2018-12-14 (×3): 12.5 mg via ORAL
  Filled 2018-12-12 (×3): qty 1

## 2018-12-12 MED ORDER — OXYCODONE HCL 5 MG PO TABS
10.0000 mg | ORAL_TABLET | ORAL | Status: DC | PRN
  Administered 2018-12-12: 15 mg via ORAL
  Filled 2018-12-12: qty 3

## 2018-12-12 MED ORDER — METOCLOPRAMIDE HCL 5 MG PO TABS: 5.0000 mg | ORAL_TABLET | Freq: Three times a day (TID) | ORAL | Status: DC | PRN

## 2018-12-12 MED ORDER — EPHEDRINE 5 MG/ML INJ
INTRAVENOUS | Status: AC
  Filled 2018-12-12: qty 10

## 2018-12-12 MED ORDER — CLINDAMYCIN PHOSPHATE 900 MG/50ML IV SOLN
900.0000 mg | INTRAVENOUS | Status: AC
  Administered 2018-12-12: 900 mg via INTRAVENOUS

## 2018-12-12 MED ORDER — DIPHENHYDRAMINE HCL 12.5 MG/5ML PO ELIX: 12.5000 mg | ORAL_SOLUTION | ORAL | Status: DC | PRN

## 2018-12-12 MED ORDER — APIXABAN 5 MG PO TABS
5.0000 mg | ORAL_TABLET | Freq: Two times a day (BID) | ORAL | Status: DC
  Administered 2018-12-13 – 2018-12-15 (×5): 5 mg via ORAL
  Filled 2018-12-12 (×5): qty 1

## 2018-12-12 MED ORDER — EPHEDRINE SULFATE-NACL 50-0.9 MG/10ML-% IV SOSY
PREFILLED_SYRINGE | INTRAVENOUS | Status: DC | PRN
  Administered 2018-12-12 (×2): 10 mg via INTRAVENOUS

## 2018-12-12 MED ORDER — METOPROLOL SUCCINATE ER 25 MG PO TB24
25.0000 mg | ORAL_TABLET | Freq: Every day | ORAL | Status: DC
  Administered 2018-12-13 – 2018-12-15 (×3): 25 mg via ORAL
  Filled 2018-12-12 (×3): qty 1

## 2018-12-12 MED ORDER — DILTIAZEM HCL ER COATED BEADS 180 MG PO CP24
180.0000 mg | ORAL_CAPSULE | Freq: Every evening | ORAL | Status: DC
  Administered 2018-12-12 – 2018-12-14 (×2): 180 mg via ORAL
  Filled 2018-12-12 (×3): qty 1

## 2018-12-12 MED ORDER — HYDROMORPHONE HCL 1 MG/ML IJ SOLN
0.5000 mg | INTRAMUSCULAR | Status: DC | PRN
  Administered 2018-12-12 – 2018-12-13 (×3): 1 mg via INTRAVENOUS
  Filled 2018-12-12 (×3): qty 1

## 2018-12-12 MED ORDER — PHENOL 1.4 % MT LIQD: 1.0000 | OROMUCOSAL | Status: DC | PRN

## 2018-12-12 MED ORDER — MIDAZOLAM HCL 2 MG/2ML IJ SOLN
INTRAMUSCULAR | Status: AC
  Filled 2018-12-12: qty 2

## 2018-12-12 MED ORDER — LACTATED RINGERS IV SOLN
INTRAVENOUS | Status: DC
  Administered 2018-12-12: 13:00:00 via INTRAVENOUS

## 2018-12-12 MED ORDER — ACETAMINOPHEN 325 MG PO TABS: 325.0000 mg | ORAL_TABLET | Freq: Four times a day (QID) | ORAL | Status: DC | PRN

## 2018-12-12 MED ORDER — BUPIVACAINE IN DEXTROSE 0.75-8.25 % IT SOLN
INTRATHECAL | Status: DC | PRN
  Administered 2018-12-12: 1.8 mL via INTRATHECAL

## 2018-12-12 MED ORDER — ADULT MULTIVITAMIN W/MINERALS CH
1.0000 | ORAL_TABLET | Freq: Every day | ORAL | Status: DC
  Administered 2018-12-13 – 2018-12-15 (×3): 1 via ORAL
  Filled 2018-12-12 (×3): qty 1

## 2018-12-12 MED ORDER — POLYETHYLENE GLYCOL 3350 17 G PO PACK: 17.0000 g | PACK | Freq: Every day | ORAL | Status: DC | PRN

## 2018-12-12 MED ORDER — 0.9 % SODIUM CHLORIDE (POUR BTL) OPTIME
TOPICAL | Status: DC | PRN
  Administered 2018-12-12: 1000 mL

## 2018-12-12 MED ORDER — ONDANSETRON HCL 4 MG PO TABS: 4.0000 mg | ORAL_TABLET | Freq: Four times a day (QID) | ORAL | Status: DC | PRN

## 2018-12-12 MED ORDER — DOCUSATE SODIUM 100 MG PO CAPS
100.0000 mg | ORAL_CAPSULE | Freq: Two times a day (BID) | ORAL | Status: DC
  Administered 2018-12-12 – 2018-12-15 (×6): 100 mg via ORAL
  Filled 2018-12-12 (×6): qty 1

## 2018-12-12 MED ORDER — FENTANYL CITRATE (PF) 250 MCG/5ML IJ SOLN
INTRAMUSCULAR | Status: AC
  Filled 2018-12-12: qty 5

## 2018-12-12 MED ORDER — METOPROLOL SUCCINATE ER 25 MG PO TB24: 12.5000 mg | ORAL_TABLET | ORAL | Status: DC

## 2018-12-12 MED ORDER — METHOCARBAMOL 1000 MG/10ML IJ SOLN
500.0000 mg | Freq: Four times a day (QID) | INTRAVENOUS | Status: DC | PRN
  Filled 2018-12-12: qty 5

## 2018-12-12 MED ORDER — ALBUMIN HUMAN 5 % IV SOLN
INTRAVENOUS | Status: AC
  Filled 2018-12-12: qty 250

## 2018-12-12 MED ORDER — CLINDAMYCIN PHOSPHATE 900 MG/50ML IV SOLN
INTRAVENOUS | Status: AC
  Filled 2018-12-12: qty 50

## 2018-12-12 MED ORDER — PROPOFOL 1000 MG/100ML IV EMUL
INTRAVENOUS | Status: AC
  Filled 2018-12-12: qty 100

## 2018-12-12 MED ORDER — METHOCARBAMOL 500 MG PO TABS
500.0000 mg | ORAL_TABLET | Freq: Four times a day (QID) | ORAL | Status: DC | PRN
  Administered 2018-12-12 – 2018-12-13 (×2): 500 mg via ORAL
  Filled 2018-12-12 (×2): qty 1

## 2018-12-12 MED ORDER — TRANEXAMIC ACID-NACL 1000-0.7 MG/100ML-% IV SOLN
1000.0000 mg | INTRAVENOUS | Status: AC
  Administered 2018-12-12: 1000 mg via INTRAVENOUS

## 2018-12-12 MED ORDER — FAMOTIDINE 20 MG PO TABS: 20.0000 mg | ORAL_TABLET | Freq: Every day | ORAL | Status: DC | PRN

## 2018-12-12 MED ORDER — VITAMIN E 180 MG (400 UNIT) PO CAPS
400.0000 [IU] | ORAL_CAPSULE | Freq: Every day | ORAL | Status: DC
  Administered 2018-12-13 – 2018-12-15 (×3): 400 [IU] via ORAL
  Filled 2018-12-12 (×3): qty 1

## 2018-12-12 MED ORDER — PROPOFOL 500 MG/50ML IV EMUL
INTRAVENOUS | Status: DC | PRN
  Administered 2018-12-12: 75 ug/kg/min via INTRAVENOUS

## 2018-12-12 MED ORDER — ALBUMIN HUMAN 5 % IV SOLN
12.5000 g | Freq: Once | INTRAVENOUS | Status: AC
  Administered 2018-12-12: 12.5 g via INTRAVENOUS

## 2018-12-12 MED ORDER — ZOLPIDEM TARTRATE 5 MG PO TABS: 5.0000 mg | ORAL_TABLET | Freq: Every evening | ORAL | Status: DC | PRN

## 2018-12-12 MED ORDER — METOCLOPRAMIDE HCL 5 MG/ML IJ SOLN: 5.0000 mg | Freq: Three times a day (TID) | INTRAMUSCULAR | Status: DC | PRN

## 2018-12-12 MED ORDER — SODIUM CHLORIDE 0.9 % IR SOLN
Status: DC | PRN
  Administered 2018-12-12: 3000 mL

## 2018-12-12 MED ORDER — MIDAZOLAM HCL 5 MG/5ML IJ SOLN
INTRAMUSCULAR | Status: DC | PRN
  Administered 2018-12-12: 2 mg via INTRAVENOUS

## 2018-12-12 MED ORDER — ONDANSETRON HCL 4 MG/2ML IJ SOLN: 4.0000 mg | Freq: Four times a day (QID) | INTRAMUSCULAR | Status: DC | PRN

## 2018-12-12 MED ORDER — PHENYLEPHRINE HCL-NACL 10-0.9 MG/250ML-% IV SOLN
INTRAVENOUS | Status: DC | PRN
  Administered 2018-12-12: 25 ug/min via INTRAVENOUS

## 2018-12-12 MED ORDER — CLINDAMYCIN PHOSPHATE 600 MG/50ML IV SOLN
600.0000 mg | Freq: Four times a day (QID) | INTRAVENOUS | Status: AC
  Administered 2018-12-12 – 2018-12-13 (×2): 600 mg via INTRAVENOUS
  Filled 2018-12-12 (×2): qty 50

## 2018-12-12 MED ORDER — POVIDONE-IODINE 10 % EX SWAB: 2.0000 "application " | Freq: Once | CUTANEOUS | Status: DC

## 2018-12-12 MED ORDER — MENTHOL 3 MG MT LOZG: 1.0000 | LOZENGE | OROMUCOSAL | Status: DC | PRN

## 2018-12-12 MED ORDER — SODIUM CHLORIDE 0.9 % IV SOLN
INTRAVENOUS | Status: DC
  Administered 2018-12-12: 19:00:00 via INTRAVENOUS

## 2018-12-12 MED ORDER — AMITRIPTYLINE HCL 50 MG PO TABS
50.0000 mg | ORAL_TABLET | Freq: Every day | ORAL | Status: DC
  Administered 2018-12-12 – 2018-12-14 (×3): 75 mg via ORAL
  Filled 2018-12-12 (×3): qty 2

## 2018-12-12 MED ORDER — TRANEXAMIC ACID-NACL 1000-0.7 MG/100ML-% IV SOLN
INTRAVENOUS | Status: AC
  Filled 2018-12-12: qty 100

## 2018-12-12 MED ORDER — ALUM & MAG HYDROXIDE-SIMETH 200-200-20 MG/5ML PO SUSP
30.0000 mL | ORAL | Status: DC | PRN
  Administered 2018-12-14: 30 mL via ORAL
  Filled 2018-12-12: qty 30

## 2018-12-12 MED ORDER — DOFETILIDE 500 MCG PO CAPS
500.0000 ug | ORAL_CAPSULE | Freq: Two times a day (BID) | ORAL | Status: DC
  Administered 2018-12-12 – 2018-12-15 (×6): 500 ug via ORAL
  Filled 2018-12-12 (×7): qty 1

## 2018-12-12 MED ORDER — CHLORHEXIDINE GLUCONATE 4 % EX LIQD: 60.0000 mL | Freq: Once | CUTANEOUS | Status: DC

## 2018-12-12 MED ORDER — ATORVASTATIN CALCIUM 10 MG PO TABS
10.0000 mg | ORAL_TABLET | Freq: Every evening | ORAL | Status: DC
  Administered 2018-12-12 – 2018-12-14 (×3): 10 mg via ORAL
  Filled 2018-12-12 (×3): qty 1

## 2018-12-12 MED ORDER — VITAMIN C 500 MG PO TABS: 500.0000 mg | ORAL_TABLET | Freq: Every day | ORAL | Status: DC | PRN

## 2018-12-12 SURGICAL SUPPLY — 54 items
BENZOIN TINCTURE PRP APPL 2/3 (GAUZE/BANDAGES/DRESSINGS) ×2 IMPLANT
BLADE SAW SGTL 18X1.27X75 (BLADE) ×2 IMPLANT
COLLAR OFFSET CORAIL SZ 15 HIP (Stem) ×1 IMPLANT
CORAIL OFFSET COLLAR SZ 15 HIP (Stem) ×2 IMPLANT
COVER SURGICAL LIGHT HANDLE (MISCELLANEOUS) ×2 IMPLANT
COVER WAND RF STERILE (DRAPES) ×2 IMPLANT
CUP ACET PNNCL SECTR W/GRIP 56 (Hips) ×1 IMPLANT
DRAPE C-ARM 42X72 X-RAY (DRAPES) ×2 IMPLANT
DRAPE STERI IOBAN 125X83 (DRAPES) ×2 IMPLANT
DRAPE U-SHAPE 47X51 STRL (DRAPES) ×6 IMPLANT
DRSG AQUACEL AG ADV 3.5X10 (GAUZE/BANDAGES/DRESSINGS) ×2 IMPLANT
DURAPREP 26ML APPLICATOR (WOUND CARE) ×2 IMPLANT
ELECT BLADE 4.0 EZ CLEAN MEGAD (MISCELLANEOUS) ×2
ELECT BLADE 6.5 EXT (BLADE) IMPLANT
ELECT REM PT RETURN 9FT ADLT (ELECTROSURGICAL) ×2
ELECTRODE BLDE 4.0 EZ CLN MEGD (MISCELLANEOUS) ×1 IMPLANT
ELECTRODE REM PT RTRN 9FT ADLT (ELECTROSURGICAL) ×1 IMPLANT
FACESHIELD WRAPAROUND (MASK) ×6 IMPLANT
GAUZE XEROFORM 1X8 LF (GAUZE/BANDAGES/DRESSINGS) ×2 IMPLANT
GLOVE BIOGEL PI IND STRL 8 (GLOVE) ×2 IMPLANT
GLOVE BIOGEL PI INDICATOR 8 (GLOVE) ×2
GLOVE ECLIPSE 8.0 STRL XLNG CF (GLOVE) ×2 IMPLANT
GLOVE ORTHO TXT STRL SZ7.5 (GLOVE) ×4 IMPLANT
GOWN STRL REUS W/ TWL LRG LVL3 (GOWN DISPOSABLE) ×2 IMPLANT
GOWN STRL REUS W/ TWL XL LVL3 (GOWN DISPOSABLE) ×2 IMPLANT
GOWN STRL REUS W/TWL LRG LVL3 (GOWN DISPOSABLE) ×2
GOWN STRL REUS W/TWL XL LVL3 (GOWN DISPOSABLE) ×2
HANDPIECE INTERPULSE COAX TIP (DISPOSABLE) ×1
HEAD CERAMIC DELTA 36 PLUS 1.5 (Hips) ×2 IMPLANT
KIT BASIN OR (CUSTOM PROCEDURE TRAY) ×2 IMPLANT
KIT TURNOVER KIT B (KITS) ×2 IMPLANT
MANIFOLD NEPTUNE II (INSTRUMENTS) ×2 IMPLANT
NS IRRIG 1000ML POUR BTL (IV SOLUTION) ×2 IMPLANT
PACK TOTAL JOINT (CUSTOM PROCEDURE TRAY) ×2 IMPLANT
PAD ARMBOARD 7.5X6 YLW CONV (MISCELLANEOUS) ×2 IMPLANT
PINN SECTOR W/GRIP ACE CUP 56 (Hips) ×2 IMPLANT
PINNACLE ALTRX PLUS 4 N 36X56 (Hips) ×2 IMPLANT
SET HNDPC FAN SPRY TIP SCT (DISPOSABLE) ×1 IMPLANT
STAPLER VISISTAT 35W (STAPLE) ×2 IMPLANT
STRIP CLOSURE SKIN 1/2X4 (GAUZE/BANDAGES/DRESSINGS) ×4 IMPLANT
SUT ETHIBOND NAB CT1 #1 30IN (SUTURE) ×2 IMPLANT
SUT MNCRL AB 4-0 PS2 18 (SUTURE) IMPLANT
SUT VIC AB 0 CT1 27 (SUTURE) ×1
SUT VIC AB 0 CT1 27XBRD ANBCTR (SUTURE) ×1 IMPLANT
SUT VIC AB 1 CT1 27 (SUTURE) ×1
SUT VIC AB 1 CT1 27XBRD ANBCTR (SUTURE) ×1 IMPLANT
SUT VIC AB 2-0 CT1 27 (SUTURE) ×1
SUT VIC AB 2-0 CT1 TAPERPNT 27 (SUTURE) ×1 IMPLANT
TOWEL GREEN STERILE (TOWEL DISPOSABLE) ×2 IMPLANT
TOWEL GREEN STERILE FF (TOWEL DISPOSABLE) ×2 IMPLANT
TRAY CATH 16FR W/PLASTIC CATH (SET/KITS/TRAYS/PACK) IMPLANT
TRAY FOLEY W/BAG SLVR 16FR (SET/KITS/TRAYS/PACK)
TRAY FOLEY W/BAG SLVR 16FR ST (SET/KITS/TRAYS/PACK) IMPLANT
WATER STERILE IRR 1000ML POUR (IV SOLUTION) ×4 IMPLANT

## 2018-12-12 NOTE — Anesthesia Procedure Notes (Signed)
Spinal  Patient location during procedure: OR Start time: 12/12/2018 2:16 PM End time: 12/12/2018 2:20 PM Staffing Anesthesiologist: Audry Pili, MD Performed: anesthesiologist  Preanesthetic Checklist Completed: patient identified, surgical consent, pre-op evaluation, timeout performed, IV checked, risks and benefits discussed and monitors and equipment checked Spinal Block Patient position: sitting Prep: DuraPrep Patient monitoring: heart rate, cardiac monitor, continuous pulse ox and blood pressure Approach: midline Location: L2-3 Injection technique: single-shot Needle Needle type: Pencan  Needle gauge: 24 G Additional Notes Consent was obtained prior to the procedure with all questions answered and concerns addressed. Risks including, but not limited to, bleeding, infection, nerve damage, paralysis, failed block, inadequate analgesia, allergic reaction, high spinal, itching, and headache were discussed and the patient wished to proceed. Functioning IV was confirmed and monitors were applied. Sterile prep and drape, including hand hygiene, mask, and sterile gloves were used. The patient was positioned and the spine was prepped. The skin was anesthetized with lidocaine. Free flow of clear CSF was obtained prior to injecting local anesthetic into the CSF. The spinal needle aspirated freely following injection. The needle was carefully withdrawn. The patient tolerated the procedure well.   Renold Don, MD

## 2018-12-12 NOTE — Brief Op Note (Signed)
12/12/2018  4:00 PM  PATIENT:  Gary [name]  68 y.o. male  PRE-OPERATIVE DIAGNOSIS:  osteoarthritis left hip  POST-OPERATIVE DIAGNOSIS:  osteoarthritis left hip  PROCEDURE:  Procedure(s): LEFT TOTAL HIP ARTHROPLASTY ANTERIOR APPROACH (Left)  SURGEON:  Surgeon(s) and Role:    Mcarthur Rossetti, MD - Primary  PHYSICIAN ASSISTANT: Benita Stabile, PA-C  ANESTHESIA:   spinal  EBL:  250 mL   COUNTS:  YES  PLAN OF CARE: Admit to inpatient   PATIENT DISPOSITION:  PACU - hemodynamically stable.   Delay start of Pharmacological VTE agent (>24hrs) due to surgical blood loss or risk of bleeding: no

## 2018-12-12 NOTE — Op Note (Signed)
NAME: Gary [name], Gary [name] MEDICAL RECORD [id number] ACCOUNT [id number] DATE OF BIRTH:September 18, 1950 FACILITY: [facility] LOCATION: [facility] PHYSICIAN:Ilani Otterson Kerry Fort, MD  OPERATIVE REPORT  DATE OF PROCEDURE:  12/12/2018  PREOPERATIVE DIAGNOSIS:  Primary osteoarthritis and degenerative joint disease, left hip.  POSTOPERATIVE DIAGNOSIS:  Primary osteoarthritis and degenerative joint disease, left hip.  PROCEDURE:  Left total hip arthroplasty through direct anterior approach.  IMPLANTS:  DePuy Sector Gription acetabular component size 56, size 36+4 polyethylene liner, size 15 Corail femoral component with high offset, size 36+1.5 ceramic hip ball.  SURGEON:  Lind Guest. Ninfa Linden, MD  ASSISTANT:  Erskine Emery, PA-C  ANESTHESIA:  Spinal.  ANTIBIOTICS:  900 mg IV clindamycin.  ESTIMATED BLOOD LOSS:  250 mL.  COMPLICATIONS:  None.  INDICATIONS:  The patient is a 68 year old gentleman with a year's worth of significantly worsening left hip pain.  His x-rays do show severe arthritis of the left hip.  He has tried and failed all forms of conservative treatment.  At this point, his  pain has become daily and it is detrimentally affecting his mobility, his quality of life and his activities of daily living to the point he does wish to proceed with total hip arthroplasty.  I talked to him in length about what the surgery involves.  We  discussed the risk of acute blood loss anemia, nerve or vessel injury, fracture, infection, DVT, dislocation and implant failure.  I talked about the goals being decreased pain, improve mobility and overall improve quality of life.  DESCRIPTION OF PROCEDURE:  After informed consent was obtained and appropriate left hip was marked, he was brought to the operating room and sat up on a stretcher where spinal anesthesia was then obtained.  He was then laid in supine position on a  stretcher.  Foley catheter was placed and both feet had traction boots applied to them.   Again, I assessed his leg lengths prior to this and he felt equal to me.  I then placed him supine on the Hana fracture table, the perineal post in place and both  legs in line skeletal traction device and no traction applied.  His left operative hip was then prepped and draped with DuraPrep and sterile drapes.  A time-out was called to identify the correct patient, correct left hip.  I then made my incision just  inferior and posterior to the anterior superior iliac spine and carried this obliquely down the leg.  We dissected down tensor fascia lata muscle.  Tensor fascia was then divided longitudinally to proceed with direct anterior approach to the hip.  We  identified and cauterized circumflex vessels and identified the hip capsule, opened the hip capsule in an L-type format, finding moderate joint effusion and significant osteophytes around the femoral head and neck.  I then placed Cobra retraction of the  medial and lateral femoral neck.  We made our femoral neck cut with an oscillating saw just proximal to the lesser trochanter.  We completed this with an osteotome.  I placed a corkscrew guide in the femoral head and removed the femoral head in its  entirety and found a wide area devoid of cartilage.  I then placed a bent Hohmann over the medial acetabular rim and removed remnants of the acetabular labrum and other debris.  I then began reaming in stepwise increments from a size 44 reamer going all  the way up to a size 55 with all reamers under direct visualization, the last reamer under direct fluoroscopy, so I could obtain  my depth of reaming my inclination and anteversion.  I then placed the real DePuy Sector Gription acetabular component size  56 without difficulty and went with a 36+4 polyethylene liner since I felt like I had medialized him and he does have significant high offset.  Attention was then turned to the femur.  With the leg externally rotated to 120 degrees, extended and  adducted,  we used a Mueller retractor medially and Hohman retractor behind the greater trochanter, released lateral joint capsule and used a box-cutting osteotome to enter the femoral canal and a rongeur to lateralize.  I then began first using the Actis  broaching system and began broaching from the starter broach to a 0 all the way up to a size 9.  It was tight distally, but it was just not feeling well proximally.  I took several fluoroscopic images and at that point, I felt like I needed to change  the broaching system, so we switched over the Corail broaching system.  With that broach, we went up to a size 15.  It did give her a longer broach.  It was a nice tight fit, so we tried a high offset femoral neck and 36+15 hip ball, reduced this in the  acetabulum and under direct fluoroscopy, we felt like we had accomplished equaling his leg length, offset, range of motion and stability assessed radiographically and mechanically.  I then dislocated the hip and removed the trial components.  We placed  the real high-offset Corail femoral component size 15 with high offset followed by the 36+1 ceramic hip ball and again reduced the acetabulum and we appreciated the stability of the hip.  We then irrigated the soft tissue with normal saline solution.  We  closed the joint capsule with interrupted #1 Ethibond suture, followed by closing the tensor fascia with #1 Vicryl, 0 Vicryl was used to close the deep tissue, 2-0 Vicryl was used to close the subcutaneous tissue and interrupted staples reapproximated  the skin.  Xeroform and Aquacel dressing was applied.  He was taken off the fracture table and taken to recovery room in stable condition.  All final counts were correct.  There were no complications noted.  Of note, Benita Stabile, PA-C did assist in the  entire case.  His assistance was crucial for facilitating all aspects of this case.  TN/NUANCE  D:12/12/2018 T:12/12/2018 JOB:[id number]

## 2018-12-12 NOTE — Plan of Care (Signed)
  Problem: Education: Goal: Knowledge of General Education information will improve Description: Including pain rating scale, medication(s)/side effects and non-pharmacologic comfort measures Outcome: Progressing   Problem: Clinical Measurements: Goal: Respiratory complications will improve Outcome: Progressing   Problem: Nutrition: Goal: Adequate nutrition will be maintained Outcome: Progressing   Problem: Coping: Goal: Level of anxiety will decrease Outcome: Progressing   Problem: Pain Managment: Goal: General experience of comfort will improve Outcome: Progressing   Problem: Safety: Goal: Ability to remain free from injury will improve Outcome: Progressing   

## 2018-12-12 NOTE — H&P (Signed)
TOTAL HIP ADMISSION H&P  Patient is admitted for left total hip arthroplasty.  Subjective:  Chief Complaint: left hip pain  HPI: Gary [name], 68 y.o. male, has a history of pain and functional disability in the left hip(s) due to arthritis and patient has failed non-surgical conservative treatments for greater than 12 weeks to include NSAID's and/or analgesics, corticosteriod injections and activity modification.  Onset of symptoms was abrupt starting 1 years ago with gradually worsening course since that time.The patient noted no past surgery on the left hip(s).  Patient currently rates pain in the left hip at 10 out of 10 with activity. Patient has night pain, worsening of pain with activity and weight bearing, pain that interfers with activities of daily living and pain with passive range of motion. Patient has evidence of subchondral cysts, subchondral sclerosis, periarticular osteophytes and joint space narrowing by imaging studies. This condition presents safety issues increasing the risk of falls.  There is no current active infection.  Patient Active Problem List   Diagnosis Date Noted  . Unilateral primary osteoarthritis, left hip 12/12/2018  . Unilateral primary osteoarthritis, right hip 11/08/2018  . Persistent atrial fibrillation ([place]) 03/22/2018  . 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 Left hip pain 09/13/2016  . Family history of cardiovascular disease 05/21/2015  . Obesity   . Hyperlipidemia   . A-fib ([place])   . Atrial flutter ([place])   . Insomnia   . Heart disease   . Back pain   . Cardiomyopathy [facility])    Past Medical History:  Diagnosis Date  . Arthritis   . Atrial flutter ([place]) 2007   s/p CTI ablation by Dr Lovena Le  . Back pain    resolved per pt 12/06/18  . Fibromyalgia   . GERD (gastroesophageal reflux disease)   . Hyperlipidemia   . Hypertension   . Insomnia    occasional per pt 12/06/18  . NICM (nonischemic cardiomyopathy) ([place])    a. 2005 - tachy  mediated. normalized after return of NSR.  [name] Obesity   . Paroxysmal atrial fibrillation ([facility])   . Pneumonia    x 1  . Spinal stenosis     Past Surgical History:  Procedure Laterality Date  . ATRIAL FIBRILLATION ABLATION N/A 03/22/2018   Procedure: ATRIAL FIBRILLATION ABLATION;  Surgeon: Thompson Grayer, MD;  Location: [place] CV LAB;  Service: Cardiovascular;  Laterality: N/A;  . atrial flutter ablation  06/10/2005   CTI ablation by Dr Lovena Le for atrial flutter  . CARDIOVERSION N/A 07/20/2016   Procedure: CARDIOVERSION;  Surgeon: Minus Breeding, MD;  Location[facility] ENDOSCOPY;  Service: Cardiovascular;  Laterality: N/A;  . CARDIOVERSION N/A 12/13/2017   Procedure: CARDIOVERSION;  Surgeon: Thayer Headings, MD;  Location: [place];  Service: Cardiovascular;  Laterality: N/A;  . CARDIOVERSION N/A 01/30/2018   Procedure: CARDIOVERSION;  Surgeon: Fay Records, MD;  Location: [place];  Service: Cardiovascular;  Laterality: N/A;  . CARDIOVERSION N/A 06/27/2018   Procedure: CARDIOVERSION;  Surgeon: Buford Dresser, MD;  Location: [facility] ENDOSCOPY;  Service: Cardiovascular;  Laterality: N/A;  . CARDIOVERSION N/A 08/16/2018   Procedure: CARDIOVERSION;  Surgeon: [name], MD;  Location: [place];  Service: Cardiovascular;  Laterality: N/A;  . CARDIOVERSION N/A 08/28/2018   Procedure: CARDIOVERSION;  Surgeon: Acie Fredrickson Wonda Cheng, MD;  Location: [place];  Service: Cardiovascular;  Laterality: N/A;  . COLONOSCOPY    . EYE SURGERY Bilateral 2019   cataracts removed  . WISDOM TOOTH EXTRACTION  Current Facility-Administered Medications  Medication Dose Route Frequency Provider Last Rate Last Dose  . chlorhexidine (HIBICLENS) 4 % liquid 4 application  60 mL Topical Once Erskine Emery W, PA-C      . clindamycin (CLEOCIN) 900 MG/50ML IVPB           . [START ON 12/13/2018] clindamycin (CLEOCIN) IVPB 900 mg  900 mg Intravenous On Call to OR Pete Pelt, PA-C      . lactated  ringers infusion   Intravenous Continuous Audry Pili, MD 10 mL/hr at 12/12/18 1244    . povidone-iodine 10 % swab 2 application  2 application Topical Once Erskine Emery W, PA-C      . tranexamic acid (CYKLOKAPRON) 1000MG /159mL IVPB           . tranexamic acid (CYKLOKAPRON) IVPB 1,000 mg  1,000 mg Intravenous To OR Mcarthur Rossetti, MD       Allergies  Allergen Reactions  . Penicillins Rash    DID THE REACTION INVOLVE: Swelling of the face/tongue/throat, SOB, or low BP? No Sudden or severe rash/hives, skin peeling, or the inside of the mouth or nose? Unknown Did it require medical treatment? Unknown When did it last happen?Childhood Allergy If all above answers are "NO", may proceed with cephalosporin use.      Social History   Tobacco Use  . Smoking status: Former Smoker    Packs/day: 0.50    Years: 7.00    Pack years: 3.50    Types: Cigarettes    Quit date: 2004    Years since quitting: 16.8  . Smokeless tobacco: Never Used  Substance Use Topics  . Alcohol use: Not Currently    Alcohol/week: 3.0 - 4.0 standard drinks    Types: 3 - 4 Glasses of wine per week    Comment: since 2020 cut back to 1-2 drinks per week    Family History  Problem Relation Age of Onset  . Heart attack Father   . Hypertension Father   . Hypertension Mother      Review of Systems  Musculoskeletal: Positive for joint pain.    Objective:  Physical Exam  Constitutional: He is oriented to person, place, and time. He appears well-developed and well-nourished.  HENT:  Head: Normocephalic and atraumatic.  Eyes: Pupils are equal, round, and reactive to light. EOM are normal.  Neck: Normal range of motion. Neck supple.  Cardiovascular: Normal rate.  Respiratory: Effort normal.  GI: Soft.  Musculoskeletal:     Left hip: He exhibits decreased range of motion, decreased strength, tenderness and bony tenderness.  Neurological: He is alert and oriented to person, place, and time.   Skin: Skin is warm and dry.  Psychiatric: He has a normal mood and affect.    Vital signs in last 24 hours: Temp:  [99.2 F (37.3 C)] 99.2 F (37.3 C) (11/17 1209) Pulse Rate:  [69] 69 (11/17 1209) Resp:  [18] 18 (11/17 1209) BP: (128)/(60) 128/60 (11/17 1209) SpO2:  [100 %] 100 % (11/17 1209) Weight:  [97.7 kg] 97.7 kg (11/17 1209)  Labs:   Estimated body mass index is 28.41 kg/m as calculated from the following:   Height as of this encounter: 6\' 1"  (1.854 m).   Weight as of this encounter: 97.7 kg.   Imaging Review Plain radiographs demonstrate severe degenerative joint disease of the left hip(s). The bone quality appears to be good for age and reported activity level.      Assessment/Plan:  End stage  arthritis, left hip(s)  The patient history, physical examination, clinical judgement of the provider and imaging studies are consistent with end stage degenerative joint disease of the left hip(s) and total hip arthroplasty is deemed medically necessary. The treatment options including medical management, injection therapy, arthroscopy and arthroplasty were discussed at length. The risks and benefits of total hip arthroplasty were presented and reviewed. The risks due to aseptic loosening, infection, stiffness, dislocation/subluxation,  thromboembolic complications and other imponderables were discussed.  The patient acknowledged the explanation, agreed to proceed with the plan and consent was signed. Patient is being admitted for inpatient treatment for surgery, pain control, PT, OT, prophylactic antibiotics, VTE prophylaxis, progressive ambulation and ADL's and discharge planning.The patient is planning to be discharged home with home health services    Patient's anticipated LOS is less than 2 midnights, meeting these requirements: - Younger than 62 - Lives within 1 hour of care - Has a competent adult at home to recover with post-op recover - NO history of  - Chronic  pain requiring opiods  - Diabetes  - Coronary Artery Disease  - Heart failure  - Heart attack  - Stroke  - DVT/VTE  - Cardiac arrhythmia  - Respiratory Failure/COPD  - Renal failure  - Anemia  - Advanced Liver disease

## 2018-12-12 NOTE — Transfer of Care (Signed)
Immediate Anesthesia Transfer of Care Note  Patient: JAQUANE PENSINGER  Procedure(s) Performed: LEFT TOTAL HIP ARTHROPLASTY ANTERIOR APPROACH (Left Hip)  Patient Location: PACU  Anesthesia Type:Spinal  Level of Consciousness: awake and patient cooperative  Airway & Oxygen Therapy: Patient Spontanous Breathing and Patient connected to nasal cannula oxygen  Post-op Assessment: Report given to RN, Post -op Vital signs reviewed and stable and Patient moving all extremities  Post vital signs: Reviewed and stable  Last Vitals:  Vitals Value Taken Time  BP    Temp    Pulse 65 12/12/18 1610  Resp 11 12/12/18 1610  SpO2 98 % 12/12/18 1610  Vitals shown include unvalidated device data.  Last Pain:  Vitals:   12/12/18 1236  TempSrc:   PainSc: 0-No pain         Complications: No apparent anesthesia complications

## 2018-12-13 ENCOUNTER — Encounter (HOSPITAL_COMMUNITY): Payer: Self-pay | Admitting: Orthopaedic Surgery

## 2018-12-13 DIAGNOSIS — K219 Gastro-esophageal reflux disease without esophagitis: Secondary | ICD-10-CM | POA: Diagnosis present

## 2018-12-13 DIAGNOSIS — I4819 Other persistent atrial fibrillation: Secondary | ICD-10-CM | POA: Diagnosis present

## 2018-12-13 DIAGNOSIS — Z87891 Personal history of nicotine dependence: Secondary | ICD-10-CM | POA: Diagnosis not present

## 2018-12-13 DIAGNOSIS — Z7901 Long term (current) use of anticoagulants: Secondary | ICD-10-CM | POA: Diagnosis not present

## 2018-12-13 DIAGNOSIS — Z79899 Other long term (current) drug therapy: Secondary | ICD-10-CM | POA: Diagnosis not present

## 2018-12-13 DIAGNOSIS — Z8249 Family history of ischemic heart disease and other diseases of the circulatory system: Secondary | ICD-10-CM | POA: Diagnosis not present

## 2018-12-13 DIAGNOSIS — I10 Essential (primary) hypertension: Secondary | ICD-10-CM | POA: Diagnosis present

## 2018-12-13 DIAGNOSIS — M4800 Spinal stenosis, site unspecified: Secondary | ICD-10-CM | POA: Diagnosis present

## 2018-12-13 DIAGNOSIS — M1612 Unilateral primary osteoarthritis, left hip: Secondary | ICD-10-CM | POA: Diagnosis present

## 2018-12-13 DIAGNOSIS — M797 Fibromyalgia: Secondary | ICD-10-CM | POA: Diagnosis present

## 2018-12-13 DIAGNOSIS — E785 Hyperlipidemia, unspecified: Secondary | ICD-10-CM | POA: Diagnosis present

## 2018-12-13 DIAGNOSIS — Z880 Allergy status to penicillin: Secondary | ICD-10-CM | POA: Diagnosis not present

## 2018-12-13 LAB — CBC
HCT: 35.9 % — ABNORMAL LOW (ref 39.0–52.0)
Hemoglobin: 12.1 g/dL — ABNORMAL LOW (ref 13.0–17.0)
MCH: 32.1 pg (ref 26.0–34.0)
MCHC: 33.7 g/dL (ref 30.0–36.0)
MCV: 95.2 fL (ref 80.0–100.0)
Platelets: 179 10*3/uL (ref 150–400)
RBC: 3.77 MIL/uL — ABNORMAL LOW (ref 4.22–5.81)
RDW: 13.3 % (ref 11.5–15.5)
WBC: 8.2 10*3/uL (ref 4.0–10.5)
nRBC: 0 % (ref 0.0–0.2)

## 2018-12-13 LAB — BASIC METABOLIC PANEL
Anion gap: 8 (ref 5–15)
BUN: 9 mg/dL (ref 8–23)
CO2: 27 mmol/L (ref 22–32)
Calcium: 8.6 mg/dL — ABNORMAL LOW (ref 8.9–10.3)
Chloride: 101 mmol/L (ref 98–111)
Creatinine, Ser: 0.92 mg/dL (ref 0.61–1.24)
GFR calc Af Amer: >60 mL/min (ref >60)
GFR calc non Af Amer: >60 mL/min (ref >60)
Glucose, Bld: 139 mg/dL — ABNORMAL HIGH (ref 70–99)
Potassium: 4 mmol/L (ref 3.5–5.1)
Sodium: 136 mmol/L (ref 135–145)

## 2018-12-13 MED ORDER — KETOROLAC TROMETHAMINE 15 MG/ML IJ SOLN
15.0000 mg | Freq: Four times a day (QID) | INTRAMUSCULAR | Status: AC
  Administered 2018-12-13 – 2018-12-14 (×2): 15 mg via INTRAVENOUS
  Filled 2018-12-13 (×3): qty 1

## 2018-12-13 MED ORDER — CHLORHEXIDINE GLUCONATE CLOTH 2 % EX PADS
6.0000 | MEDICATED_PAD | Freq: Every day | CUTANEOUS | Status: DC
  Administered 2018-12-14 – 2018-12-15 (×2): 6 via TOPICAL

## 2018-12-13 NOTE — Progress Notes (Signed)
Nurse got EKG on patient for administration of Tikosyn. QTc interval is less than 500, medication administered. EKG results showed "A. Fib RVR" with a rate of 102. Patient states he feels fine and should kick out of A. Fib once medication is in his system. Night time on-call provider for Dr. Ninfa Linden paged and made aware of EKG results and patient asymptomatic at this time. No additional orders made. Will continue to monitor patient.

## 2018-12-13 NOTE — Progress Notes (Signed)
PT Progress Note for Charges    12/13/18 1300  PT Visit Information  Last PT Received On 12/13/18  PT General Charges  $$ ACUTE PT VISIT 1 Visit  PT Evaluation  $PT Eval Moderate Complexity 1 Mod  PT Treatments  $Gait Training 8-22 mins  $Therapeutic Exercise 8-22 mins  Anastasio Champion, DPT  Acute Rehabilitation Services Pager [phone] Office [phone]

## 2018-12-13 NOTE — Anesthesia Postprocedure Evaluation (Signed)
Anesthesia Post Note  Patient: Gary [name]  Procedure(s) Performed: LEFT TOTAL HIP ARTHROPLASTY ANTERIOR APPROACH (Left Hip)     Patient location during evaluation: PACU Anesthesia Type: Spinal Level of consciousness: oriented and awake and alert Pain management: pain level controlled Vital Signs Assessment: post-procedure vital signs reviewed and stable Respiratory status: spontaneous breathing, respiratory function stable and patient connected to nasal cannula oxygen Cardiovascular status: blood pressure returned to baseline and stable Postop Assessment: no headache, no backache and no apparent nausea or vomiting Anesthetic complications: no    Last Vitals:  Vitals:   12/13/18 0246 12/13/18 0803  BP: (!) 98/54 125/68  Pulse: 60 72  Resp: 18 20  Temp: 36.7 C 37 C  SpO2: 97% 99%                 Audry Pili

## 2018-12-13 NOTE — Progress Notes (Signed)
Subjective: 1 Day Post-Op Procedure(s) (LRB): LEFT TOTAL HIP ARTHROPLASTY ANTERIOR APPROACH (Left) Patient reports pain as severe.    Objective: Vital signs in last 24 hours: Temp:  [97.3 F (36.3 C)-99.2 F (37.3 C)] 98.6 F (37 C) (11/18 0803) Pulse Rate:  [55-72] 72 (11/18 0803) Resp:  [11-20] 20 (11/18 0803) BP: (83-130)/(52-69) 125/68 (11/18 0803) SpO2:  [94 %-100 %] 99 % (11/18 0803) Weight:  [97.7 kg] 97.7 kg (11/17 1209)  Intake/Output from previous day: 11/17 0701 - 11/18 0700 In: 1614.7 [P.O.:120; I.V.:1444.7; IV Piggyback:50] Out: 1100 [Urine:850; Blood:250] Intake/Output this shift: Total I/O In: -  Out: 300 [Urine:300]  Recent Labs    12/13/18 0241  HGB 12.1*   Recent Labs    12/13/18 0241  WBC 8.2  RBC 3.77*  HCT 35.9*  PLT 179   Recent Labs    12/13/18 0241  NA 136  K 4.0  CL 101  CO2 27  BUN 9  CREATININE 0.92  GLUCOSE 139*  CALCIUM 8.6*   Recent Labs    12/12/18 1225  INR 1.1    Sensation intact distally Intact pulses distally Dorsiflexion/Plantar flexion intact Incision: dressing C/D/I   Assessment/Plan: 1 Day Post-Op Procedure(s) (LRB): LEFT TOTAL HIP ARTHROPLASTY ANTERIOR APPROACH (Left) Up with therapy Plan for discharge tomorrow Discharge home with home health   Anticipated LOS equal to or greater than 2 midnights due to - Age 69 and older with one or more of the following:  - Obesity  - Expected need for hospital services (PT, OT, Nursing) required for safe  discharge  - Anticipated need for postoperative skilled nursing care or inpatient rehab  - Active co-morbidities: None OR   - Unanticipated findings during/Post Surgery: Slow post-op progression: GI, pain control, mobility  - Patient is a high risk of re-admission due to: None    Mcarthur Rossetti 12/13/2018, 8:06 AM

## 2018-12-13 NOTE — Progress Notes (Signed)
Physical Therapy Treatment Patient Details Name: Gary [name] MRN: [id number] DOB: 11/26/1950 Today's Date: 12/13/2018    History of Present Illness Pt is a 68 y/o male s/p elective L THA (direct anterior approach). PMH including but not limited to a-fib, spinal stenosis, HTN, HLD.    PT Comments    Pt making steady progress with functional mobility. He was able to increase his distance ambulated with min guard and use of RW out into hallway this session. PT will continue to follow acutely to progress mobility as tolerated.   Pt would continue to benefit from skilled physical therapy services at this time while admitted and after d/c to address the below listed limitations in order to improve overall safety and independence with functional mobility.    Follow Up Recommendations  Outpatient PT     Equipment Recommendations  Rolling walker with 5" wheels;3in1 (PT)    Recommendations for Other Services       Precautions / Restrictions Precautions Precautions: Fall Restrictions Weight Bearing Restrictions: Yes LLE Weight Bearing: Weight bearing as tolerated    Mobility  Bed Mobility Overal bed mobility: Needs Assistance Bed Mobility: Sit to Supine       Sit to supine: Min assist   General bed mobility comments: min A needed to return bilateral LEs onto bed; pt able to manage trunk independently with use of bed rails  Transfers Overall transfer level: Needs assistance Equipment used: Rolling walker (2 wheeled) Transfers: Sit to/from Stand Sit to Stand: Min assist         General transfer comment: increased time and effort required, cueing for safe hand placement, min A for stability with transition into standing from recliner chair  Ambulation/Gait Ambulation/Gait assistance: Min guard Gait Distance (Feet): 50 Feet Assistive device: Rolling walker (2 wheeled) Gait Pattern/deviations: Step-through pattern;Decreased step length - right;Decreased stance time -  left;Decreased stride length;Decreased weight shift to left Gait velocity: decreased   General Gait Details: pt with good sequencing with RW, no instability or LOB, min guard for safety   Stairs             Wheelchair Mobility    Modified Rankin (Stroke Patients Only)       Balance Overall balance assessment: Needs assistance Sitting-balance support: Feet supported Sitting balance-Leahy Scale: Good     Standing balance support: Bilateral upper extremity supported Standing balance-Leahy Scale: Poor                              Cognition Arousal/Alertness: Awake/alert Behavior During Therapy: WFL for tasks assessed/performed Overall Cognitive Status: Within Functional Limits for tasks assessed                                        Exercises      General Comments        Pertinent Vitals/Pain Pain Assessment: 0-10 Pain Score: 5  Pain Location: L hip Pain Descriptors / Indicators: Burning;Aching;Sore Pain Intervention(s): Monitored during session;Repositioned    Home Living                      Prior Function            PT Goals (current goals can now be found in the care plan section) Acute Rehab PT Goals PT Goal Formulation: With patient Time For Goal Achievement: 12/27/18  Potential to Achieve Goals: Good Progress towards PT goals: Progressing toward goals    Frequency    7X/week      PT Plan Current plan remains appropriate    Co-evaluation              AM-PAC PT "6 Clicks" Mobility   Outcome Measure  Help needed turning from your back to your side while in a flat bed without using bedrails?: A Little Help needed moving from lying on your back to sitting on the side of a flat bed without using bedrails?: A Little Help needed moving to and from a bed to a chair (including a wheelchair)?: A Little Help needed standing up from a chair using your arms (e.g., wheelchair or bedside chair)?: A  Little Help needed to walk in hospital room?: A Little Help needed climbing 3-5 steps with a railing? : A Little 6 Click Score: 18    End of Session Equipment Utilized During Treatment: Gait belt Activity Tolerance: Patient limited by pain Patient left: in bed;with call bell/phone within reach Nurse Communication: Mobility status PT Visit Diagnosis: Other abnormalities of gait and mobility (R26.89);Pain Pain - Right/Left: Left Pain - part of body: Hip     Time: 1449-1510 PT Time Calculation (min) (ACUTE ONLY): 21 min  Charges:  $Gait Training: 8-22 mins                     Anastasio Champion, DPT  Acute Rehabilitation Services Pager [phone] Office [place] 12/13/2018, 4:43 PM

## 2018-12-13 NOTE — Evaluation (Signed)
Physical Therapy Evaluation Patient Details Name: Gary [name] MRN: [id number] DOB: 07-07-1950 Today's Date: 12/13/2018   History of Present Illness  Pt is a 68 y.o. M admitted for primary osteoarthritis of L hip addressed with a THA on 12/12/18. He has a PMH including but not limited to heart disease, spinal stenosis, R hip osteoarthritis, hyperlipidemia, and A-fib.  Clinical Impression  Pt admitted for above diagnosis with above surgical procedure. Pt reports that his pain has decreased since earlier this morning and is agreeable to PT evaluation/intervention. Started with quad sets and heel slides, and he demonstrated limited ROM and strength in L LE due to pain with these exercises. He required mod assist in order to move his legs off the EOB during bed mobility, as well as increased time. He noted that the pain in his L hip increased significantly during sit to stand transfer and he required several attempts to power up. After being given verbal cueing for sequencing, he was able to sequence his steps and the RW well on his own without additional cueing. He denied lightheadedness or dizziness after bed mobility and sit to stand transfer, but became lightheaded and pale after ambulating 7 ft with RW. After sitting in the recliner, his BP dropped to 86/48 and then rose to 98/49 with O2 sat at 97% after a few minutes. NT present and informed. Recommend OP PT following discharge since PT has wife at home to assist him with mobility if necessary and transport him to PT. Pt would benefit from continued acute skilled PT in order to address deficits and improve functional mobility.     Follow Up Recommendations Outpatient PT    Equipment Recommendations  Rolling walker with 5" wheels    Recommendations for Other Services       Precautions / Restrictions Precautions Precautions: None Restrictions Weight Bearing Restrictions: Yes LLE Weight Bearing: Weight bearing as tolerated       Mobility  Bed Mobility Overal bed mobility: Needs Assistance Bed Mobility: Rolling;Sidelying to Sit Rolling: Min guard Sidelying to sit: Mod assist       General bed mobility comments: required mod A to help move legs off EOB during sidelying to sit  Transfers Overall transfer level: Needs assistance Equipment used: Rolling walker (2 wheeled) Transfers: Sit to/from Stand Sit to Stand: Min assist;Mod assist         General transfer comment: required min A for power up after several attempts and increased time, but mod A for encouragement  Ambulation/Gait Ambulation/Gait assistance: Min guard Gait Distance (Feet): 7 Feet Assistive device: Rolling walker (2 wheeled) Gait Pattern/deviations: Step-to pattern;Decreased step length - right;Decreased step length - left;Decreased stance time - left;Antalgic Gait velocity: decr   General Gait Details: pt became lightheaded after ambulating 7 ft but demonstrated good knowledge of sequencing after being given one verbal cue  Stairs            Wheelchair Mobility    Modified Rankin (Stroke Patients Only)       Balance                                             Pertinent Vitals/Pain Pain Assessment: 0-10 Pain Score: 5  Pain Location: L hip Pain Descriptors / Indicators: Aching;Constant;Sharp;Stabbing;Burning Pain Intervention(s): Limited activity within patient's tolerance;Monitored during session;Premedicated before session;Repositioned    Home Living Family/patient expects to be  discharged to:: Private residence Living Arrangements: Spouse/significant other Available Help at Discharge: Family Type of Home: House Home Access: Stairs to enter Entrance Stairs-Rails: None(two posts to help but no rail) Technical brewer of Steps: 3 Home Layout: Able to live on main level with bedroom/bathroom;Two level Home Equipment: Hand held shower head;Shower seat;Cane - single point      Prior  Function Level of Independence: Independent               Hand Dominance   Dominant Hand: Right    Extremity/Trunk Assessment        Lower Extremity Assessment Lower Extremity Assessment: LLE deficits/detail LLE Deficits / Details: limited hip and knee flexion due to pain       Communication   Communication: No difficulties  Cognition Arousal/Alertness: Awake/alert Behavior During Therapy: WFL for tasks assessed/performed Overall Cognitive Status: Within Functional Limits for tasks assessed                                        General Comments General comments (skin integrity, edema, etc.): pt became lightheaded and pale at end of ambulation     Exercises Total Joint Exercises Ankle Circles/Pumps: AROM;Both;10 reps;Seated Quad Sets: Strengthening;Supine;10 reps;Left Heel Slides: AROM;Left;10 reps;Supine   Assessment/Plan    PT Assessment Patient needs continued PT services  PT Problem List Decreased strength;Decreased range of motion;Decreased activity tolerance;Decreased balance;Decreased mobility;Decreased knowledge of use of DME;Decreased safety awareness;Pain       PT Treatment Interventions DME instruction;Gait training;Stair training;Functional mobility training;Therapeutic activities;Therapeutic exercise;Balance training;Neuromuscular re-education;Patient/family education;Modalities    PT Goals (Current goals can be found in the Care Plan section)  Acute Rehab PT Goals Patient Stated Goal: to stop pain and walk better PT Goal Formulation: With patient Time For Goal Achievement: 12/27/18 Potential to Achieve Goals: Good    Frequency 7X/week   Barriers to discharge        Co-evaluation               AM-PAC PT "6 Clicks" Mobility  Outcome Measure Help needed turning from your back to your side while in a flat bed without using bedrails?: A Little Help needed moving from lying on your back to sitting on the side of a flat  bed without using bedrails?: A Lot Help needed moving to and from a bed to a chair (including a wheelchair)?: A Little Help needed standing up from a chair using your arms (e.g., wheelchair or bedside chair)?: A Little Help needed to walk in hospital room?: A Little Help needed climbing 3-5 steps with a railing? : A Lot 6 Click Score: 16    End of Session Equipment Utilized During Treatment: Gait belt Activity Tolerance: Patient limited by fatigue;Patient limited by pain Patient left: in chair;with call bell/phone within reach Nurse Communication: Mobility status;Patient requests pain meds;Weight bearing status PT Visit Diagnosis: Unsteadiness on feet (R26.81);Other abnormalities of gait and mobility (R26.89);Pain;Muscle weakness (generalized) (M62.81) Pain - Right/Left: Left Pain - part of body: Hip    Time: 1017-1100 PT Time Calculation (min) (ACUTE ONLY): 43 min   Charges:   PT Evaluation $PT Eval Moderate Complexity: 1 Mod PT Treatments $Gait Training: 8-22 mins $Therapeutic Exercise: 8-22 mins        Silvana Newness, SPT   [place] Elanie Hammitt 12/13/2018, 1:46 PM

## 2018-12-14 MED ORDER — SODIUM CHLORIDE 0.9 % IV BOLUS
500.0000 mL | Freq: Once | INTRAVENOUS | Status: AC
  Administered 2018-12-14: 10:00:00 via INTRAVENOUS

## 2018-12-14 NOTE — Progress Notes (Signed)
PT Progress Note for Charges    12/14/18 1200  PT Visit Information  Last PT Received On 12/14/18  PT General Charges  $$ ACUTE PT VISIT 1 Visit  PT Treatments  $Gait Training 8-22 mins  $Therapeutic Activity 8-22 mins  Anastasio Champion, DPT  Acute Rehabilitation Services Pager [phone] Office [phone]

## 2018-12-14 NOTE — Progress Notes (Signed)
Physical Therapy Treatment Patient Details Name: Gary [name] MRN: [id number] DOB: [birth year] Today's Date: 12/14/2018    History of Present Illness Pt is a 68 y/o male s/p elective L THA (direct anterior approach). PMH including but not limited to a-fib, spinal stenosis, HTN, HLD.    PT Comments    Pt limited today by fatigue and pain. He was able to complete bed mobility min guard but is still unable to move L LE off the bed without help of R LE. He demonstrated improvement in sit to stand transfer and completed this min guard as well. He walked 10 ft to the bathroom but stated that he became lightheaded after walking to the sink. He completed stand to sit to [facility] smoothly and with good safety awareness. Checked vitals and his BP was 84/50. Completed LAQ and ankle pumps in sitting, after which BP dropped to 81/47. Pt completed sit to stand and stand pivot transfer min guard for safety into the recliner. Recliner was moved into a supine position to re-check BP, which had risen to 93/56. Pt stated that he felt a little better after getting into the recliner. O2 sats ranged from 95-98% and HR ranged from 71-96 throughout each measurement. Pt would benefit from continued skilled acute PT in order to address deficits and improve funtional mobility.   Follow Up Recommendations  Outpatient PT     Equipment Recommendations  Rolling walker with 5" wheels;3in1 (PT)    Recommendations for Other Services       Precautions / Restrictions Precautions Precautions: Fall Restrictions Weight Bearing Restrictions: Yes LLE Weight Bearing: Weight bearing as tolerated    Mobility  Bed Mobility Overal bed mobility: Needs Assistance Bed Mobility: Sit to Supine       Sit to supine: Min guard   General bed mobility comments: pt able to hook R LE under L to help move it off bed. He required use of bed rails to help move his trunk   Transfers Overall transfer level: Needs assistance Equipment used:  Rolling walker (2 wheeled) Transfers: Sit to/from Stand Sit to Stand: Min guard         General transfer comment: increased time and effort required, good hand placement today without cueing, only took two attempts for him to power up himself which is an improvement from yesterday  Ambulation/Gait Ambulation/Gait assistance: Min guard Gait Distance (Feet): 10 Feet Assistive device: Rolling walker (2 wheeled) Gait Pattern/deviations: Step-to pattern;Decreased step length - right;Decreased step length - left;Decreased stance time - left Gait velocity: decreased   General Gait Details: pt with good sequencing with RW without instability or LOB   Stairs             Wheelchair Mobility    Modified Rankin (Stroke Patients Only)       Balance Overall balance assessment: Needs assistance Sitting-balance support: Feet supported Sitting balance-Leahy Scale: Good Sitting balance - Comments: pt able to maintain sitting balance on EOB without UE support   Standing balance support: Bilateral upper extremity supported Standing balance-Leahy Scale: Poor Standing balance comment: requires use of RW to maintain standing balance                            Cognition Arousal/Alertness: Awake/alert Behavior During Therapy: WFL for tasks assessed/performed Overall Cognitive Status: Within Functional Limits for tasks assessed  Exercises Total Joint Exercises Ankle Circles/Pumps: AROM;Both;10 reps;Seated Long Arc Quad: AROM;Both;10 reps;Seated    General Comments General comments (skin integrity, edema, etc.): pt became orthostatic again today during mobility (described in clinical impression)      Pertinent Vitals/Pain Pain Assessment: 0-10 Pain Score: 6  Pain Location: L hip Pain Descriptors / Indicators: Burning;Aching;Sore Pain Intervention(s): Limited activity within patient's tolerance;Monitored during  session;RN gave pain meds during session    Home Living                      Prior Function            PT Goals (current goals can now be found in the care plan section) Acute Rehab PT Goals Patient Stated Goal: to stop pain and walk better Progress towards PT goals: Progressing toward goals    Frequency    7X/week      PT Plan Current plan remains appropriate    Co-evaluation              AM-PAC PT "6 Clicks" Mobility   Outcome Measure  Help needed turning from your back to your side while in a flat bed without using bedrails?: A Little Help needed moving from lying on your back to sitting on the side of a flat bed without using bedrails?: A Little Help needed moving to and from a bed to a chair (including a wheelchair)?: A Little Help needed standing up from a chair using your arms (e.g., wheelchair or bedside chair)?: A Little Help needed to walk in hospital room?: A Little Help needed climbing 3-5 steps with a railing? : A Lot 6 Click Score: 17    End of Session Equipment Utilized During Treatment: Gait belt Activity Tolerance: Patient limited by fatigue;Patient limited by pain;Other (comment)(Pt limited by drop in BP) Patient left: in chair;with call bell/phone within reach Nurse Communication: Mobility status;Patient requests pain meds PT Visit Diagnosis: Other abnormalities of gait and mobility (R26.89);Pain Pain - Right/Left: Left Pain - part of body: Hip     Time: [id number] PT Time Calculation (min) (ACUTE ONLY): 33 min  Charges:  $Gait Training: 8-22 mins $Therapeutic Activity: 8-22 mins                     Gary [name], SPT   Gary [name] 12/14/2018, 12:37 PM

## 2018-12-14 NOTE — Progress Notes (Signed)
Physical Therapy Treatment Patient Details Name: Gary [name] MRN: [id number] DOB: 1950-11-04 Today's Date: 12/14/2018    History of Present Illness Pt is a 68 y/o male s/p elective L THA (direct anterior approach). PMH including but not limited to a-fib, spinal stenosis, HTN, HLD.    PT Comments    Pt remains limited secondary to pain and fatigue. Not progressing as well as initially hoped. Therefore, updated recommendations to HHPT. Will need stair training at next session prior to d/c home. Pt would continue to benefit from skilled physical therapy services at this time while admitted and after d/c to address the below listed limitations in order to improve overall safety and independence with functional mobility.    Follow Up Recommendations  Home health PT;Supervision for mobility/OOB     Equipment Recommendations  Rolling walker with 5" wheels;3in1 (PT)    Recommendations for Other Services       Precautions / Restrictions Precautions Precautions: Fall Restrictions Weight Bearing Restrictions: Yes LLE Weight Bearing: Weight bearing as tolerated    Mobility  Bed Mobility Overal bed mobility: Needs Assistance Bed Mobility: Sit to Supine       Sit to supine: Mod assist   General bed mobility comments: assistance needed to return bilateral LEs onto bed  Transfers Overall transfer level: Needs assistance Equipment used: Rolling walker (2 wheeled) Transfers: Sit to/from Stand Sit to Stand: Min assist         General transfer comment: assist to power into standing from recliner chair; cueing again needed for sequencing/technique  Ambulation/Gait Ambulation/Gait assistance: Min guard Gait Distance (Feet): 20 Feet Assistive device: Rolling walker (2 wheeled) Gait Pattern/deviations: Step-to pattern;Decreased step length - right;Decreased step length - left;Decreased stance time - left Gait velocity: decreased   General Gait Details: pt with good sequencing  with RW without instability or LOB; however, greatly limited secondary to pain and fatigue   Stairs             Wheelchair Mobility    Modified Rankin (Stroke Patients Only)       Balance Overall balance assessment: Needs assistance Sitting-balance support: Feet supported Sitting balance-Leahy Scale: Good     Standing balance support: Bilateral upper extremity supported Standing balance-Leahy Scale: Poor                              Cognition Arousal/Alertness: Awake/alert Behavior During Therapy: WFL for tasks assessed/performed Overall Cognitive Status: Within Functional Limits for tasks assessed                                        Exercises Total Joint Exercises Quad Sets: AROM;Strengthening;Left;10 reps;Supine Gluteal Sets: AROM;Strengthening;Both;10 reps;Supine Heel Slides: AAROM;Left;10 reps;Supine Hip ABduction/ADduction: AAROM;Left;10 reps;Supine    General Comments        Pertinent Vitals/Pain Pain Assessment: Faces Faces Pain Scale: Hurts even more Pain Location: L hip Pain Descriptors / Indicators: Burning;Aching;Sore Pain Intervention(s): Monitored during session;Repositioned    Home Living                      Prior Function            PT Goals (current goals can now be found in the care plan section) Acute Rehab PT Goals PT Goal Formulation: With patient Time For Goal Achievement: 12/27/18 Potential to Achieve Goals: Good  Progress towards PT goals: Progressing toward goals    Frequency    7X/week      PT Plan Discharge plan needs to be updated    Co-evaluation              AM-PAC PT "6 Clicks" Mobility   Outcome Measure  Help needed turning from your back to your side while in a flat bed without using bedrails?: A Little Help needed moving from lying on your back to sitting on the side of a flat bed without using bedrails?: A Little Help needed moving to and from a bed to a  chair (including a wheelchair)?: A Little Help needed standing up from a chair using your arms (e.g., wheelchair or bedside chair)?: A Little Help needed to walk in hospital room?: A Little Help needed climbing 3-5 steps with a railing? : A Lot 6 Click Score: 17    End of Session Equipment Utilized During Treatment: Gait belt Activity Tolerance: Patient limited by pain;Patient limited by fatigue Patient left: in bed;with call bell/phone within reach;with family/visitor present Nurse Communication: Mobility status PT Visit Diagnosis: Other abnormalities of gait and mobility (R26.89);Pain Pain - Right/Left: Left Pain - part of body: Hip     Time: 1449-1520 PT Time Calculation (min) (ACUTE ONLY): 31 min  Charges:  $Gait Training: 8-22 mins $Therapeutic Exercise: 8-22 mins                     Anastasio Champion, DPT  Acute Rehabilitation Services Pager [phone] Office [place] 12/14/2018, 5:01 PM

## 2018-12-14 NOTE — Plan of Care (Signed)

## 2018-12-14 NOTE — Discharge Instructions (Addendum)
Information on my medicine - ELIQUIS (apixaban)  Why was Eliquis prescribed for you? Eliquis was prescribed for you to reduce the risk of a blood clot forming that can cause a stroke if you have a medical condition called atrial fibrillation (a type of irregular heartbeat).  What do You need to know about Eliquis ? Take your Eliquis TWICE DAILY - one tablet in the morning and one tablet in the evening with or without food. If you have difficulty swallowing the tablet whole please discuss with your pharmacist how to take the medication safely.  Take Eliquis exactly as prescribed by your doctor and DO NOT stop taking Eliquis without talking to the doctor who prescribed the medication.  Stopping may increase your risk of developing a stroke.  Refill your prescription before you run out.  After discharge, you should have regular check-up appointments with your healthcare provider that is prescribing your Eliquis.  In the future your dose may need to be changed if your kidney function or weight changes by a significant amount or as you get older.  What do you do if you miss a dose? If you miss a dose, take it as soon as you remember on the same day and resume taking twice daily.  Do not take more than one dose of ELIQUIS at the same time to make up a missed dose.  Important Safety Information A possible side effect of Eliquis is bleeding. You should call your healthcare provider right away if you experience any of the following: ? Bleeding from an injury or your nose that does not stop. ? Unusual colored urine (red or dark brown) or unusual colored stools (red or black). ? Unusual bruising for unknown reasons. ? A serious fall or if you hit your head (even if there is no bleeding).  Some medicines may interact with Eliquis and might increase your risk of bleeding or clotting while on Eliquis. To help avoid this, consult your healthcare provider or pharmacist prior to using any new  prescription or non-prescription medications, including herbals, vitamins, non-steroidal anti-inflammatory drugs (NSAIDs) and supplements.  This website has more information on Eliquis (apixaban): http://www.eliquis.com/eliquis/home INSTRUCTIONS AFTER JOINT REPLACEMENT   o Remove items at home which could result in a fall. This includes throw rugs or furniture in walking pathways o ICE to the affected joint every three hours while awake for 30 minutes at a time, for at least the first 3-5 days, and then as needed for pain and swelling.  Continue to use ice for pain and swelling. You may notice swelling that will progress down to the foot and ankle.  This is normal after surgery.  Elevate your leg when you are not up walking on it.   o Continue to use the breathing machine you got in the hospital (incentive spirometer) which will help keep your temperature down.  It is common for your temperature to cycle up and down following surgery, especially at night when you are not up moving around and exerting yourself.  The breathing machine keeps your lungs expanded and your temperature down.   DIET:  As you were doing prior to hospitalization, we recommend a well-balanced diet.  DRESSING / WOUND CARE / SHOWERING  Keep the surgical dressing until follow up.  The dressing is water proof, so you can shower without any extra covering.  IF THE DRESSING FALLS OFF or the wound gets wet inside, change the dressing with sterile gauze.  Please use good hand washing techniques before  changing the dressing.  Do not use any lotions or creams on the incision until instructed by your surgeon.    ACTIVITY  o Increase activity slowly as tolerated, but follow the weight bearing instructions below.   o No driving for 6 weeks or until further direction given by your physician.  You cannot drive while taking narcotics.  o No lifting or carrying greater than 10 lbs. until further directed by your surgeon. o Avoid periods  of inactivity such as sitting longer than an hour when not asleep. This helps prevent blood clots.  o You may return to work once you are authorized by your doctor.     WEIGHT BEARING   Weight bearing as tolerated with assist device (walker, cane, etc) as directed, use it as long as suggested by your surgeon or therapist, typically at least 4-6 weeks.   EXERCISES  Results after joint replacement surgery are often greatly improved when you follow the exercise, range of motion and muscle strengthening exercises prescribed by your doctor. Safety measures are also important to protect the joint from further injury. Any time any of these exercises cause you to have increased pain or swelling, decrease what you are doing until you are comfortable again and then slowly increase them. If you have problems or questions, call your caregiver or physical therapist for advice.   Rehabilitation is important following a joint replacement. After just a few days of immobilization, the muscles of the leg can become weakened and shrink (atrophy).  These exercises are designed to build up the tone and strength of the thigh and leg muscles and to improve motion. Often times heat used for twenty to thirty minutes before working out will loosen up your tissues and help with improving the range of motion but do not use heat for the first two weeks following surgery (sometimes heat can increase post-operative swelling).   These exercises can be done on a training (exercise) mat, on the floor, on a table or on a bed. Use whatever works the best and is most comfortable for you.    Use music or television while you are exercising so that the exercises are a pleasant break in your day. This will make your life better with the exercises acting as a break in your routine that you can look forward to.   Perform all exercises about fifteen times, three times per day or as directed.  You should exercise both the operative leg and the  other leg as well.  Exercises include:    Quad Sets - Tighten up the muscle on the front of the thigh (Quad) and hold for 5-10 seconds.    Straight Leg Raises - With your knee straight (if you were given a brace, keep it on), lift the leg to 60 degrees, hold for 3 seconds, and slowly lower the leg.  Perform this exercise against resistance later as your leg gets stronger.   Leg Slides: Lying on your back, slowly slide your foot toward your buttocks, bending your knee up off the floor (only go as far as is comfortable). Then slowly slide your foot back down until your leg is flat on the floor again.   Angel Wings: Lying on your back spread your legs to the side as far apart as you can without causing discomfort.   Hamstring Strength:  Lying on your back, push your heel against the floor with your leg straight by tightening up the muscles of your buttocks.  Repeat, but  this time bend your knee to a comfortable angle, and push your heel against the floor.  You may put a pillow under the heel to make it more comfortable if necessary.   A rehabilitation program following joint replacement surgery can speed recovery and prevent re-injury in the future due to weakened muscles. Contact your doctor or a physical therapist for more information on knee rehabilitation.    CONSTIPATION  Constipation is defined medically as fewer than three stools per week and severe constipation as less than one stool per week.  Even if you have a regular bowel pattern at home, your normal regimen is likely to be disrupted due to multiple reasons following surgery.  Combination of anesthesia, postoperative narcotics, change in appetite and fluid intake all can affect your bowels.   YOU MUST use at least one of the following options; they are listed in order of increasing strength to get the job done.  They are all available over the counter, and you may need to use some, POSSIBLY even all of these options:    Drink plenty  of fluids (prune juice may be helpful) and high fiber foods Colace 100 mg by mouth twice a day  Senokot for constipation as directed and as needed Dulcolax (bisacodyl), take with full glass of water  Miralax (polyethylene glycol) once or twice a day as needed.  If you have tried all these things and are unable to have a bowel movement in the first 3-4 days after surgery call either your surgeon or your primary doctor.    If you experience loose stools or diarrhea, hold the medications until you stool forms back up.  If your symptoms do not get better within 1 week or if they get worse, check with your doctor.  If you experience "the worst abdominal pain ever" or develop nausea or vomiting, please contact the office immediately for further recommendations for treatment.   ITCHING:  If you experience itching with your medications, try taking only a single pain pill, or even half a pain pill at a time.  You can also use Benadryl over the counter for itching or also to help with sleep.   TED HOSE STOCKINGS:  Use stockings on both legs until for at least 2 weeks or as directed by physician office. They may be removed at night for sleeping.  MEDICATIONS:  See your medication summary on the After Visit Summary that nursing will review with you.  You may have some home medications which will be placed on hold until you complete the course of blood thinner medication.  It is important for you to complete the blood thinner medication as prescribed.  PRECAUTIONS:  If you experience chest pain or shortness of breath - call 911 immediately for transfer to the hospital emergency department.   If you develop a fever greater that 101 F, purulent drainage from wound, increased redness or drainage from wound, foul odor from the wound/dressing, or calf pain - CONTACT YOUR SURGEON.                                                   FOLLOW-UP APPOINTMENTS:  If you do not already have a post-op appointment, please  call the office for an appointment to be seen by your surgeon.  Guidelines for how soon to be seen are listed in  your After Visit Summary, but are typically between 1-4 weeks after surgery.  OTHER INSTRUCTIONS:   Knee Replacement:  Do not place pillow under knee, focus on keeping the knee straight while resting. CPM instructions: 0-90 degrees, 2 hours in the morning, 2 hours in the afternoon, and 2 hours in the evening. Place foam block, curve side up under heel at all times except when in CPM or when walking.  DO NOT modify, tear, cut, or change the foam block in any way.  MAKE SURE YOU:   Understand these instructions.   Get help right away if you are not doing well or get worse.    Thank you for letting us be a part of your medical care team.  It is a privilege we respect greatly.  We hope these instructions will help you stay on track for a fast and full recovery!

## 2018-12-14 NOTE — Progress Notes (Signed)
Patient's QTC 420, same as yesterday.

## 2018-12-14 NOTE — Progress Notes (Signed)
Subjective: 2 Days Post-Op Procedure(s) (LRB): LEFT TOTAL HIP ARTHROPLASTY ANTERIOR APPROACH (Left) Patient reports pain as moderate.  Patient with symptoms of dizziness.   Objective: Vital signs in last 24 hours: Temp:  [98.4 F (36.9 C)-99.2 F (37.3 C)] 98.4 F (36.9 C) (11/19 0755) Pulse Rate:  [68-99] 97 (11/19 0755) Resp:  [14-20] 20 (11/19 0755) BP: (90-105)/(47-54) 98/53 (11/19 0755) SpO2:  [95 %-100 %] 97 % (11/19 0755)  Intake/Output from previous day: 11/18 0701 - 11/19 0700 In: 1238.1 [P.O.:780; I.V.:458.1] Out: 650 [Urine:650] Intake/Output this shift: No intake/output data recorded.  Recent Labs    12/13/18 0241  HGB 12.1*   Recent Labs    12/13/18 0241  WBC 8.2  RBC 3.77*  HCT 35.9*  PLT 179   Recent Labs    12/13/18 0241  NA 136  K 4.0  CL 101  CO2 27  BUN 9  CREATININE 0.92  GLUCOSE 139*  CALCIUM 8.6*   Recent Labs    12/12/18 1225  INR 1.1    Awake and alert no acute distress.  Dressing changed scant drainage.    Assessment/Plan: 2 Days Post-Op Procedure(s) (LRB): LEFT TOTAL HIP ARTHROPLASTY ANTERIOR APPROACH (Left) Dizziness will give a fluid bolus 500 cc.  Will hold discharge due to dizziness.  QTC interval less than 500. Tykosin to be given.      Corryn Madewell 12/14/2018, 9:37 AM

## 2018-12-14 NOTE — Progress Notes (Signed)
Dr. Ninfa Linden notified of EKG being down prior to each dose of Tykosin for pharmacy requesting QTC.  Will revaluate if needed.

## 2018-12-15 MED ORDER — OXYCODONE HCL 5 MG PO TABS: 5.0000 mg | ORAL_TABLET | ORAL | 0 refills | Status: DC | PRN

## 2018-12-15 MED ORDER — METHOCARBAMOL 500 MG PO TABS: 500.0000 mg | ORAL_TABLET | Freq: Four times a day (QID) | ORAL | 1 refills | Status: DC | PRN

## 2018-12-15 NOTE — Discharge Summary (Signed)
Patient ID: Gary [name] MRN: [id number] DOB/AGE: Apr 14, 1950 68 y.o.  Admit date: 12/12/2018 Discharge date: 12/15/2018  Admission Diagnoses:  Principal Problem:   Unilateral primary osteoarthritis, left hip Active Problems:   Status post total replacement of left hip   Discharge Diagnoses:  Same  Past Medical History:  Diagnosis Date  . Arthritis   . Atrial flutter ([place]) 2007   s/p CTI ablation by Dr Lovena Le  . Back pain    resolved per pt 12/06/18  . Fibromyalgia   . GERD (gastroesophageal reflux disease)   . Hyperlipidemia   . Hypertension   . Insomnia    occasional per pt 12/06/18  . NICM (nonischemic cardiomyopathy) ([place])    a. 2005 - tachy mediated. normalized after return of NSR.  [name] Obesity   . Paroxysmal atrial fibrillation ([facility])   . Pneumonia    x 1  . Spinal stenosis     Surgeries: Procedure(s): LEFT TOTAL HIP ARTHROPLASTY ANTERIOR APPROACH on 12/12/2018   Consultants:   Discharged Condition: Improved  Hospital Course: Gary [name] is an 68 y.o. male who was admitted 12/12/2018 for operative treatment ofUnilateral primary osteoarthritis, left hip. Patient has severe unremitting pain that affects sleep, daily activities, and work/hobbies. After pre-op clearance the patient was taken to the operating room on 12/12/2018 and underwent  Procedure(s): LEFT TOTAL HIP ARTHROPLASTY ANTERIOR APPROACH.    Patient was given perioperative antibiotics:  Anti-infectives (From admission, onward)   Start     Dose/Rate Route Frequency Ordered Stop   12/13/18 0600  clindamycin (CLEOCIN) IVPB 900 mg     900 mg 100 mL/hr over 30 Minutes Intravenous On call to O.R. 12/12/18 1204 12/12/18 1425   12/12/18 2000  clindamycin (CLEOCIN) IVPB 600 mg     600 mg 100 mL/hr over 30 Minutes Intravenous Every 6 hours 12/12/18 1823 12/13/18 0256   12/12/18 1215  clindamycin (CLEOCIN) 900 MG/50ML IVPB    Note to Pharmacy: Nyoka Cowden   : cabinet override      12/12/18 1215  12/12/18 1410       Patient was given sequential compression devices, early ambulation, and chemoprophylaxis to prevent DVT.  Patient benefited maximally from hospital stay and there were no complications.    Recent vital signs:  Patient Vitals for the past 24 hrs:  BP Temp Temp src Pulse Resp SpO2  12/15/18 0351 (!) 102/53 98.4 F (36.9 C) Oral 82 18 99 %  12/14/18 1916 (!) 105/56 98.5 F (36.9 C) Oral 97 18 98 %  12/14/18 0755 (!) 98/53 98.4 F (36.9 C) Oral 97 20 97 %     Recent laboratory studies:  Recent Labs    12/12/18 1225 12/13/18 0241  WBC  --  8.2  HGB  --  12.1*  HCT  --  35.9*  PLT  --  179  NA  --  136  K  --  4.0  CL  --  101  CO2  --  27  BUN  --  9  CREATININE  --  0.92  GLUCOSE  --  139*  INR 1.1  --   CALCIUM  --  8.6*     Discharge Medications:   Allergies as of 12/15/2018      Reactions   Penicillins Rash   DID THE REACTION INVOLVE: Swelling of the face/tongue/throat, SOB, or low BP? No Sudden or severe rash/hives, skin peeling, or the inside of the mouth or nose? Unknown Did it require medical treatment? Unknown  When did it last happen?Childhood Allergy If all above answers are "NO", may proceed with cephalosporin use.      Medication List    STOP taking these medications   HYDROcodone-acetaminophen 5-325 MG tablet Commonly known as: NORCO/VICODIN     TAKE these medications   acetaminophen 500 MG tablet Commonly known as: TYLENOL Take 1,000 mg by mouth daily as needed for moderate pain.   amitriptyline 50 MG tablet Commonly known as: ELAVIL Take 50-75 mg by mouth at bedtime.   atorvastatin 10 MG tablet Commonly known as: LIPITOR Take 10 mg by mouth every evening.   calcium carbonate 500 MG chewable tablet Commonly known as: TUMS - dosed in mg elemental calcium Chew 2 tablets by mouth daily as needed for indigestion or heartburn.   diltiazem 180 MG 24 hr capsule Commonly known as: CARDIZEM CD Take 1 capsule (180  mg total) by mouth every evening.   dofetilide 500 MCG capsule Commonly known as: TIKOSYN Take 1 capsule (500 mcg total) by mouth 2 (two) times daily.   Eliquis 5 MG Tabs tablet Generic drug: apixaban TAKE 1 TABLET BY MOUTH TWICE DAILY What changed: how much to take   famotidine 20 MG tablet Commonly known as: PEPCID Take 20 mg by mouth daily as needed for heartburn or indigestion.   Fish Oil 1200 MG Caps Take 1,200 mg by mouth 2 (two) times daily.   GAS-X PO Take 2 tablets by mouth daily as needed (gas).   methocarbamol 500 MG tablet Commonly known as: ROBAXIN Take 1 tablet (500 mg total) by mouth every 6 (six) hours as needed for muscle spasms.   metoprolol succinate 25 MG 24 hr tablet Commonly known as: TOPROL-XL Take 1 tablet in the AM and 1/2 tablet in the PM What changed:   how much to take  how to take this  when to take this  additional instructions   multivitamin with minerals Tabs tablet Take 1 tablet by mouth daily.   oxyCODONE 5 MG immediate release tablet Commonly known as: Oxy IR/ROXICODONE Take 1-2 tablets (5-10 mg total) by mouth every 4 (four) hours as needed for moderate pain (pain score 4-6).   sildenafil 50 MG tablet Commonly known as: VIAGRA Take 50 mg by mouth daily as needed for erectile dysfunction.   valACYclovir 1000 MG tablet Commonly known as: VALTREX Take 2,000 mg by mouth every 12 (twelve) hours as needed (cold sore).   vitamin C 500 MG tablet Commonly known as: ASCORBIC ACID Take 500 mg by mouth daily as needed (immune support).   vitamin E 400 UNIT capsule Generic drug: vitamin E Take 400 Units by mouth daily.   zolpidem 10 MG tablet Commonly known as: AMBIEN Take 10 mg by mouth at bedtime as needed for sleep.       Diagnostic Studies: Dg Pelvis Portable  Result Date: 12/12/2018 CLINICAL DATA:  Post op EXAM: PORTABLE PELVIS 1-2 VIEWS COMPARISON:  11/08/2018 FINDINGS: Cutaneous staples over the left hip with soft  tissue gas. Interval left hip replacement with normal alignment and intact hardware. No fracture. IMPRESSION: Interval left hip replacement with expected postsurgical change Electronically Signed   By: Donavan Foil M.D.   On: 12/12/2018 16:31   Dg C-arm 1-60 Min  Result Date: 12/12/2018 CLINICAL DATA:  Hip replacement EXAM: OPERATIVE left HIP (WITH PELVIS IF PERFORMED) 3 VIEWS TECHNIQUE: Fluoroscopic spot image(s) were submitted for interpretation post-operatively. COMPARISON:  11/08/2018 FINDINGS: Three low resolution intraoperative spot views of the left hip. Total  fluoroscopy time was 26 seconds. The images demonstrate a left hip replacement with normal alignment IMPRESSION: Intraoperative fluoroscopic assistance provided during left hip surgery Electronically Signed   By: Donavan Foil M.D.   On: 12/12/2018 16:32   Dg Hip Operative Unilat With Pelvis Left  Result Date: 12/12/2018 CLINICAL DATA:  Hip replacement EXAM: OPERATIVE left HIP (WITH PELVIS IF PERFORMED) 3 VIEWS TECHNIQUE: Fluoroscopic spot image(s) were submitted for interpretation post-operatively. COMPARISON:  11/08/2018 FINDINGS: Three low resolution intraoperative spot views of the left hip. Total fluoroscopy time was 26 seconds. The images demonstrate a left hip replacement with normal alignment IMPRESSION: Intraoperative fluoroscopic assistance provided during left hip surgery Electronically Signed   By: Donavan Foil M.D.   On: 12/12/2018 16:32    Disposition: Discharge disposition: 01-Home or [place]    Mcarthur Rossetti, MD Follow up in 2 week(s).   Specialty: Orthopedic Surgery Contact information: [address] [place] [address] [phone]            Signed: Mcarthur Rossetti 12/15/2018, 6:49 AM

## 2018-12-15 NOTE — TOC Transition Note (Signed)
Transition of Care [facility]) - CM/SW Discharge Note   Patient Details  Name: Gary [name] MRN: [id number] Date of Birth: [birth year]  Transition of [facility]) CM/SW Contact:  Marilu Favre, RN Phone Number: 12/15/2018, 10:20 AM   Clinical Narrative:     Confirmed face sheet information with patient.   Patient to discharged this afternoon with HHPT through [facility] ( tiffany aware) , called Zack with [facility] to bring walker and 3 in 1 to hospital room prior to discharge.  Final next level of care: [place] Barriers to Discharge: No Barriers Identified   Patient Goals and CMS Choice Patient states their goals for this hospitalization and ongoing recovery are:: to go home CMS Medicare.gov Compare Post Acute Care list provided to:: Patient Choice offered to / list presented to : Patient  Discharge Placement                       Discharge Plan and Services   Discharge Planning Services: CM Consult Post Acute Care Choice: Home Health, Durable Medical Equipment          DME Arranged: 3-N-1, Walker rolling DME Agency: [employer] Date DME Agency Contacted: 12/15/18 Time DME Agency Contacted: [phone] Representative spoke with at DME Agency: [place]: PT [place]: [facility] (now [facility]) Date [place]: 12/15/18 Time [place]: 77 Representative spoke with at [place]: [place] ([place]) Interventions     Readmission Risk Interventions No flowsheet data found.

## 2018-12-15 NOTE — Progress Notes (Signed)
Subjective: 3 Days Post-Op Procedure(s) (LRB): LEFT TOTAL HIP ARTHROPLASTY ANTERIOR APPROACH (Left) Patient reports pain as moderate.    Objective: Vital signs in last 24 hours: Temp:  [98.4 F (36.9 C)-98.5 F (36.9 C)] 98.4 F (36.9 C) (11/20 0351) Pulse Rate:  [82-97] 82 (11/20 0351) Resp:  [18-20] 18 (11/20 0351) BP: (98-105)/(53-56) 102/53 (11/20 0351) SpO2:  [97 %-99 %] 99 % (11/20 0351)  Intake/Output from previous day: 11/19 0701 - 11/20 0700 In: 360 [P.O.:360] Out: 4025 [Urine:4025] Intake/Output this shift: Total I/O In: -  Out: 2725 [Urine:2725]  Recent Labs    12/13/18 0241  HGB 12.1*   Recent Labs    12/13/18 0241  WBC 8.2  RBC 3.77*  HCT 35.9*  PLT 179   Recent Labs    12/13/18 0241  NA 136  K 4.0  CL 101  CO2 27  BUN 9  CREATININE 0.92  GLUCOSE 139*  CALCIUM 8.6*   Recent Labs    12/12/18 1225  INR 1.1    Sensation intact distally Intact pulses distally Dorsiflexion/Plantar flexion intact Incision: dressing C/D/I   Assessment/Plan: 3 Days Post-Op Procedure(s) (LRB): LEFT TOTAL HIP ARTHROPLASTY ANTERIOR APPROACH (Left) Up with therapy Discharge home with home health this afternoon       Mcarthur Rossetti 12/15/2018, 6:46 AM

## 2018-12-15 NOTE — Care Management Important Message (Signed)
Important Message  Patient Details  Name: Gary [name] MRN: [id number] Date of Birth: 03/30/1950   Medicare Important Message Given:  Yes     Memory Argue 12/15/2018, 12:22 PM

## 2018-12-15 NOTE — Progress Notes (Signed)
Physical Therapy Treatment Patient Details Name: Gary [name] MRN: [id number] DOB: 02/21/1950 Today's Date: 12/15/2018    History of Present Illness Pt is a 68 y/o male s/p elective L THA (direct anterior approach). PMH including but not limited to a-fib, spinal stenosis, HTN, HLD.    PT Comments    Pt making fair progress with functional mobility. Pt tolerated initiation of stair training this session well with spouse present throughout. PT provided demonstration, instruction and handout in how to ascend/descend steps with use of RW as pt has no hand railing with his steps at home. Will continue stair training at next session with wife's participation.  Of note, pt with decreasing BP throughout and reports of feeling "lightheaded". BP sitting at beginning of session was 90/59 mmHg; after stair training in sitting was 75/47 mmHg; at end of session in sitting was 119/66 mmHg. Pt's RN was notified.     Follow Up Recommendations  Home health PT;Supervision for mobility/OOB     Equipment Recommendations  Rolling walker with 5" wheels;3in1 (PT)    Recommendations for Other Services       Precautions / Restrictions Precautions Precautions: Fall Restrictions Weight Bearing Restrictions: Yes LLE Weight Bearing: Weight bearing as tolerated    Mobility  Bed Mobility Overal bed mobility: Needs Assistance Bed Mobility: Supine to Sit     Supine to sit: Min guard     General bed mobility comments: greatly increased time and effort, HOB elevated, use of bed rails, achieving upright sitting at EOB towards pt's R side, no physical assistance needed  Transfers Overall transfer level: Needs assistance Equipment used: Rolling walker (2 wheeled) Transfers: Sit to/from Stand Sit to Stand: Min assist         General transfer comment: assistance needed for stability and to power into standing from EOB x1 and from recliner chair x1  Ambulation/Gait Ambulation/Gait assistance: Min  guard Gait Distance (Feet): 10 Feet Assistive device: Rolling walker (2 wheeled) Gait Pattern/deviations: Step-to pattern;Decreased step length - right;Decreased step length - left;Decreased stance time - left Gait velocity: decreased   General Gait Details: pt with good sequencing with RW without instability or LOB; however, greatly limited secondary to pain and fatigue   Stairs Stairs: Yes Stairs assistance: Min assist Stair Management: No rails;Step to pattern;Backwards;With walker Number of Stairs: 2 General stair comments: PT provided demonstration, instruction and handout for ascending/descending steps with a RW and one person assistance to stability anterior aspect of RW. Pt's spouse was present throughout training as well.    Wheelchair Mobility    Modified Rankin (Stroke Patients Only)       Balance Overall balance assessment: Needs assistance Sitting-balance support: Feet supported Sitting balance-Leahy Scale: Good     Standing balance support: Bilateral upper extremity supported Standing balance-Leahy Scale: Poor Standing balance comment: requires use of RW to maintain standing balance                            Cognition Arousal/Alertness: Awake/alert Behavior During Therapy: WFL for tasks assessed/performed Overall Cognitive Status: Within Functional Limits for tasks assessed                                        Exercises      General Comments        Pertinent Vitals/Pain Pain Assessment: Faces Faces Pain Scale: Hurts  even more Pain Location: L hip Pain Descriptors / Indicators: Burning;Aching;Sore Pain Intervention(s): Monitored during session;Repositioned    Home Living                      Prior Function            PT Goals (current goals can now be found in the care plan section) Acute Rehab PT Goals PT Goal Formulation: With patient Time For Goal Achievement: 12/27/18 Potential to Achieve Goals:  Good Progress towards PT goals: Progressing toward goals    Frequency    7X/week      PT Plan Current plan remains appropriate    Co-evaluation              AM-PAC PT "6 Clicks" Mobility   Outcome Measure  Help needed turning from your back to your side while in a flat bed without using bedrails?: A Little Help needed moving from lying on your back to sitting on the side of a flat bed without using bedrails?: A Little Help needed moving to and from a bed to a chair (including a wheelchair)?: A Little Help needed standing up from a chair using your arms (e.g., wheelchair or bedside chair)?: A Little Help needed to walk in hospital room?: A Little Help needed climbing 3-5 steps with a railing? : A Lot 6 Click Score: 17    End of Session Equipment Utilized During Treatment: Gait belt Activity Tolerance: Patient limited by pain;Patient limited by fatigue Patient left: in chair;with call bell/phone within reach;with family/visitor present Nurse Communication: Mobility status PT Visit Diagnosis: Other abnormalities of gait and mobility (R26.89);Pain Pain - Right/Left: Left Pain - part of body: Hip     Time: 1103-1130 PT Time Calculation (min) (ACUTE ONLY): 27 min  Charges:  $Gait Training: 8-22 mins $Therapeutic Activity: 8-22 mins                     Anastasio Champion, DPT  Acute Rehabilitation Services Pager [phone] Office [place] 12/15/2018, 12:25 PM

## 2018-12-15 NOTE — Progress Notes (Signed)
PT Progress Note for Charges    12/15/18 1544  PT General Charges  $$ ACUTE PT VISIT 1 Visit  PT Treatments  $Gait Training 8-22 mins  $Therapeutic Activity 8-22 mins  Anastasio Champion, DPT  Acute Rehabilitation Services Pager [phone] Office [phone]

## 2018-12-15 NOTE — Progress Notes (Signed)
Physical Therapy Treatment Patient Details Name: Gary [name] MRN: [id number] DOB: 01-31-1950 Today's Date: 12/15/2018    History of Present Illness Pt is a 68 y/o male s/p elective L THA (direct anterior approach). PMH including but not limited to a-fib, spinal stenosis, HTN, HLD.    PT Comments    Pt demonstrates improvement in functional mobility this afternoon. He continues to note pain and fatigue after sit to stand transfer. He was able to navigate stairs safely with min A with his wife blocking the walker. Both of them stated that they feel they will be able to help pt navigate stairs safely at home. Pt able to increase gait distance to 60 ft today before fatiguing, but he demonstrates improvement in gait with step-through pattern. Pt notes he does not feel lightheaded following gait, just "tired." Pt encouraged to stand or ambulate every hour and to continue performing hip exercises throughout the day. Pt would benefit from continued skilled PT intervention in order to address deficits and improve functional mobility.   After completing sit to stand and walking to the chair from EOB, his BP was 104/60 mmHg. After completing stair navigation and 60 ft of gait training, his BP was 112/71 mmHg.   Follow Up Recommendations  Home health PT;Supervision for mobility/OOB     Equipment Recommendations  Rolling walker with 5" wheels;3in1 (PT)    Recommendations for Other Services       Precautions / Restrictions Precautions Precautions: Fall Restrictions Weight Bearing Restrictions: Yes LLE Weight Bearing: Weight bearing as tolerated    Mobility  Bed Mobility         General bed mobility comments: Pt at EOB upon arrival  Transfers Overall transfer level: Needs assistance Equipment used: Rolling walker (2 wheeled) Transfers: Sit to/from Stand Sit to Stand: Min assist         General transfer comment: assistance needed for stability and to power into standing from EOB  x1 and from recliner chair x1  Ambulation/Gait Ambulation/Gait assistance: Min guard Gait Distance (Feet): 60 Feet Assistive device: Rolling walker (2 wheeled) Gait Pattern/deviations: Step-through pattern;Antalgic Gait velocity: decreased   General Gait Details: pt with good sequencing with RW, able to ambulate further this afternoon without LOB or significant increase in pain, although pt is fatigued after   Stairs Stairs: Yes Stairs assistance: Min assist Stair Management: No rails;Step to pattern;Backwards;With walker Number of Stairs: 2 General stair comments: Pt provided verbal cueing and held onto gait belt for safety while wife blocked the walker; pt demonstrated understanding of stair navigation   Wheelchair Mobility    Modified Rankin (Stroke Patients Only)       Balance Overall balance assessment: Needs assistance Sitting-balance support: Feet supported Sitting balance-Leahy Scale: Good Sitting balance - Comments: pt able to maintain sitting balance on EOB without UE support   Standing balance support: Bilateral upper extremity supported;During functional activity Standing balance-Leahy Scale: Poor Standing balance comment: requires use of RW to maintain standing balance                            Cognition Arousal/Alertness: Awake/alert Behavior During Therapy: WFL for tasks assessed/performed Overall Cognitive Status: Within Functional Limits for tasks assessed                                        Exercises  General Comments        Pertinent Vitals/Pain Pain Assessment: Faces Faces Pain Scale: Hurts even more Pain Location: L hip Pain Descriptors / Indicators: Burning;Aching;Sore Pain Intervention(s): Limited activity within patient's tolerance;Monitored during session;Repositioned    Home Living                      Prior Function            PT Goals (current goals can now be found in the care  plan section) Acute Rehab PT Goals Patient Stated Goal: to stop pain and walk better PT Goal Formulation: With patient Time For Goal Achievement: 12/27/18 Potential to Achieve Goals: Good Progress towards PT goals: Progressing toward goals    Frequency    7X/week      PT Plan Current plan remains appropriate    Co-evaluation              AM-PAC PT "6 Clicks" Mobility   Outcome Measure  Help needed turning from your back to your side while in a flat bed without using bedrails?: A Little Help needed moving from lying on your back to sitting on the side of a flat bed without using bedrails?: A Little Help needed moving to and from a bed to a chair (including a wheelchair)?: A Little Help needed standing up from a chair using your arms (e.g., wheelchair or bedside chair)?: A Little Help needed to walk in hospital room?: A Little Help needed climbing 3-5 steps with a railing? : A Lot 6 Click Score: 17    End of Session Equipment Utilized During Treatment: Gait belt Activity Tolerance: Patient limited by pain;Patient limited by fatigue Patient left: in chair;with call bell/phone within reach;with family/visitor present Nurse Communication: Mobility status PT Visit Diagnosis: Other abnormalities of gait and mobility (R26.89);Pain Pain - Right/Left: Left Pain - part of body: Hip     Time: [id number] PT Time Calculation (min) (ACUTE ONLY): 24 min  Charges:   $Gait Training: 8-22 mins $Therapeutic Activity: 8-22 mins                                                                                                                            Silvana Newness, SPT    Gary [name] 12/15/2018, 3:48 PM

## 2018-12-17 DIAGNOSIS — M797 Fibromyalgia: Secondary | ICD-10-CM | POA: Diagnosis not present

## 2018-12-17 DIAGNOSIS — I119 Hypertensive heart disease without heart failure: Secondary | ICD-10-CM | POA: Diagnosis not present

## 2018-12-17 DIAGNOSIS — Z7901 Long term (current) use of anticoagulants: Secondary | ICD-10-CM | POA: Diagnosis not present

## 2018-12-17 DIAGNOSIS — Z6827 Body mass index (BMI) 27.0-27.9, adult: Secondary | ICD-10-CM | POA: Diagnosis not present

## 2018-12-17 DIAGNOSIS — E669 Obesity, unspecified: Secondary | ICD-10-CM | POA: Diagnosis not present

## 2018-12-17 DIAGNOSIS — M1611 Unilateral primary osteoarthritis, right hip: Secondary | ICD-10-CM | POA: Diagnosis not present

## 2018-12-17 DIAGNOSIS — I428 Other cardiomyopathies: Secondary | ICD-10-CM | POA: Diagnosis not present

## 2018-12-17 DIAGNOSIS — I4819 Other persistent atrial fibrillation: Secondary | ICD-10-CM | POA: Diagnosis not present

## 2018-12-17 DIAGNOSIS — E785 Hyperlipidemia, unspecified: Secondary | ICD-10-CM | POA: Diagnosis not present

## 2018-12-17 DIAGNOSIS — M4800 Spinal stenosis, site unspecified: Secondary | ICD-10-CM | POA: Diagnosis not present

## 2018-12-17 DIAGNOSIS — Z87891 Personal history of nicotine dependence: Secondary | ICD-10-CM | POA: Diagnosis not present

## 2018-12-17 DIAGNOSIS — K219 Gastro-esophageal reflux disease without esophagitis: Secondary | ICD-10-CM | POA: Diagnosis not present

## 2018-12-17 DIAGNOSIS — I4892 Unspecified atrial flutter: Secondary | ICD-10-CM | POA: Diagnosis not present

## 2018-12-17 DIAGNOSIS — Z8701 Personal history of pneumonia (recurrent): Secondary | ICD-10-CM | POA: Diagnosis not present

## 2018-12-17 DIAGNOSIS — Z96642 Presence of left artificial hip joint: Secondary | ICD-10-CM | POA: Diagnosis not present

## 2018-12-17 DIAGNOSIS — G4700 Insomnia, unspecified: Secondary | ICD-10-CM | POA: Diagnosis not present

## 2018-12-17 DIAGNOSIS — Z471 Aftercare following joint replacement surgery: Secondary | ICD-10-CM | POA: Diagnosis not present

## 2018-12-17 DIAGNOSIS — K5900 Constipation, unspecified: Secondary | ICD-10-CM | POA: Diagnosis not present

## 2018-12-26 DIAGNOSIS — K5900 Constipation, unspecified: Secondary | ICD-10-CM | POA: Diagnosis not present

## 2018-12-26 DIAGNOSIS — Z8701 Personal history of pneumonia (recurrent): Secondary | ICD-10-CM | POA: Diagnosis not present

## 2018-12-26 DIAGNOSIS — I4819 Other persistent atrial fibrillation: Secondary | ICD-10-CM | POA: Diagnosis not present

## 2018-12-26 DIAGNOSIS — M797 Fibromyalgia: Secondary | ICD-10-CM | POA: Diagnosis not present

## 2018-12-26 DIAGNOSIS — M1611 Unilateral primary osteoarthritis, right hip: Secondary | ICD-10-CM | POA: Diagnosis not present

## 2018-12-26 DIAGNOSIS — Z471 Aftercare following joint replacement surgery: Secondary | ICD-10-CM | POA: Diagnosis not present

## 2018-12-26 DIAGNOSIS — I4892 Unspecified atrial flutter: Secondary | ICD-10-CM | POA: Diagnosis not present

## 2018-12-26 DIAGNOSIS — Z96642 Presence of left artificial hip joint: Secondary | ICD-10-CM | POA: Diagnosis not present

## 2018-12-26 DIAGNOSIS — I428 Other cardiomyopathies: Secondary | ICD-10-CM | POA: Diagnosis not present

## 2018-12-26 DIAGNOSIS — E669 Obesity, unspecified: Secondary | ICD-10-CM | POA: Diagnosis not present

## 2018-12-26 DIAGNOSIS — E785 Hyperlipidemia, unspecified: Secondary | ICD-10-CM | POA: Diagnosis not present

## 2018-12-26 DIAGNOSIS — Z7901 Long term (current) use of anticoagulants: Secondary | ICD-10-CM | POA: Diagnosis not present

## 2018-12-26 DIAGNOSIS — K219 Gastro-esophageal reflux disease without esophagitis: Secondary | ICD-10-CM | POA: Diagnosis not present

## 2018-12-26 DIAGNOSIS — I119 Hypertensive heart disease without heart failure: Secondary | ICD-10-CM | POA: Diagnosis not present

## 2018-12-26 DIAGNOSIS — M4800 Spinal stenosis, site unspecified: Secondary | ICD-10-CM | POA: Diagnosis not present

## 2018-12-26 DIAGNOSIS — Z6827 Body mass index (BMI) 27.0-27.9, adult: Secondary | ICD-10-CM | POA: Diagnosis not present

## 2018-12-26 DIAGNOSIS — Z87891 Personal history of nicotine dependence: Secondary | ICD-10-CM | POA: Diagnosis not present

## 2018-12-26 DIAGNOSIS — G4700 Insomnia, unspecified: Secondary | ICD-10-CM | POA: Diagnosis not present

## 2018-12-27 ENCOUNTER — Other Ambulatory Visit: Payer: Self-pay

## 2018-12-27 ENCOUNTER — Encounter: Payer: Self-pay | Admitting: Orthopaedic Surgery

## 2018-12-27 ENCOUNTER — Ambulatory Visit (INDEPENDENT_AMBULATORY_CARE_PROVIDER_SITE_OTHER): Payer: PPO | Admitting: Orthopaedic Surgery

## 2018-12-27 DIAGNOSIS — Z96642 Presence of left artificial hip joint: Secondary | ICD-10-CM

## 2018-12-27 MED ORDER — OXYCODONE HCL 5 MG PO TABS: 5.0000 mg | ORAL_TABLET | ORAL | 0 refills | Status: DC | PRN

## 2018-12-27 NOTE — Progress Notes (Signed)
The patient comes in today and his 2-week visit status post a left total hip arthroplasty.  He says he is doing great overall and has no issues.  He is ambulate with a walker.  He does have still some problems sleeping at night.  He is requesting a refill of oxycodone I think this is reasonable.  He is going to home therapy as well.  On exam his staple line looks good on left hip.  I removed the staples in place Steri-Strips.  His leg lengths are equal.  I did give him a note to extend his home health therapy for at least 1-2 more weeks because I think this is helping him quite a bit.  We talked about increasing activities as comfort allows.  We talked about things to avoid and not avoid.  All question concerns were answered and addressed.  We will see what it looks like in 4 weeks from now from a clinical exam standpoint but no x-rays are needed.

## 2019-01-12 ENCOUNTER — Other Ambulatory Visit: Payer: Self-pay | Admitting: Interventional Cardiology

## 2019-01-12 NOTE — Telephone Encounter (Signed)
Prescription refill request for Eliquis received.  Last office visit: 11/30/2018, Allred Scr: 0.92, 12/13/2018 Age: 68 y.o. Weight: 97.2 kg  Prescription refill sent.

## 2019-01-16 DIAGNOSIS — M5440 Lumbago with sciatica, unspecified side: Secondary | ICD-10-CM | POA: Diagnosis not present

## 2019-01-24 ENCOUNTER — Encounter: Payer: Self-pay | Admitting: Orthopaedic Surgery

## 2019-01-24 ENCOUNTER — Ambulatory Visit (INDEPENDENT_AMBULATORY_CARE_PROVIDER_SITE_OTHER): Payer: PPO | Admitting: Orthopaedic Surgery

## 2019-01-24 DIAGNOSIS — Z96642 Presence of left artificial hip joint: Secondary | ICD-10-CM

## 2019-01-24 NOTE — Progress Notes (Signed)
   The patient comes in today at 6 weeks status post a left total hip arthroplasty.  He is ambulating still with a cane but does not use it much.  He has some tiredness of that left hip is what he describes and some numbness and tingling but he said all those are slowly improving.  He does do his home exercises but not every day.  He is satisfied overall and how progress is being had with his left total hip arthroplasty.  On examination he walks with only a slight limp.  He tolerates me easily putting his left hip the range of motion.  There is some subjective numbness around the incision but everything looks good overall.  At this point I do not need to see him back for 6 months.  At that visit I like a standing low AP pelvis and a lateral of his left operative hip.  If there is any issues before then he will let us know.  All question concerns were answered and addressed.

## 2019-02-02 DIAGNOSIS — R7301 Impaired fasting glucose: Secondary | ICD-10-CM | POA: Diagnosis not present

## 2019-02-02 DIAGNOSIS — G4700 Insomnia, unspecified: Secondary | ICD-10-CM | POA: Diagnosis not present

## 2019-02-02 DIAGNOSIS — I119 Hypertensive heart disease without heart failure: Secondary | ICD-10-CM | POA: Diagnosis not present

## 2019-02-02 DIAGNOSIS — E782 Mixed hyperlipidemia: Secondary | ICD-10-CM | POA: Diagnosis not present

## 2019-02-02 DIAGNOSIS — Z0000 Encounter for general adult medical examination without abnormal findings: Secondary | ICD-10-CM | POA: Diagnosis not present

## 2019-02-02 DIAGNOSIS — I429 Cardiomyopathy, unspecified: Secondary | ICD-10-CM | POA: Diagnosis not present

## 2019-02-02 DIAGNOSIS — D751 Secondary polycythemia: Secondary | ICD-10-CM | POA: Diagnosis not present

## 2019-02-02 DIAGNOSIS — I4891 Unspecified atrial fibrillation: Secondary | ICD-10-CM | POA: Diagnosis not present

## 2019-02-02 DIAGNOSIS — M109 Gout, unspecified: Secondary | ICD-10-CM | POA: Diagnosis not present

## 2019-02-02 DIAGNOSIS — M4800 Spinal stenosis, site unspecified: Secondary | ICD-10-CM | POA: Diagnosis not present

## 2019-02-02 DIAGNOSIS — R1310 Dysphagia, unspecified: Secondary | ICD-10-CM | POA: Diagnosis not present

## 2019-02-02 DIAGNOSIS — Z125 Encounter for screening for malignant neoplasm of prostate: Secondary | ICD-10-CM | POA: Diagnosis not present

## 2019-02-06 DIAGNOSIS — E782 Mixed hyperlipidemia: Secondary | ICD-10-CM | POA: Diagnosis not present

## 2019-02-06 DIAGNOSIS — Z125 Encounter for screening for malignant neoplasm of prostate: Secondary | ICD-10-CM | POA: Diagnosis not present

## 2019-02-06 DIAGNOSIS — D751 Secondary polycythemia: Secondary | ICD-10-CM | POA: Diagnosis not present

## 2019-02-06 DIAGNOSIS — R7309 Other abnormal glucose: Secondary | ICD-10-CM | POA: Diagnosis not present

## 2019-02-06 DIAGNOSIS — I119 Hypertensive heart disease without heart failure: Secondary | ICD-10-CM | POA: Diagnosis not present

## 2019-02-21 ENCOUNTER — Ambulatory Visit: Payer: PPO

## 2019-02-25 ENCOUNTER — Ambulatory Visit: Payer: PPO

## 2019-02-25 NOTE — Progress Notes (Signed)
Cardiology Office Note   Date:  02/25/2019   ID:  Gary [name], DOB 06/23/1950, MRN [id number]  PCP:  [name], MD    No chief complaint on file.  AFib  Wt Readings from Last 3 Encounters:  12/12/18 215 lb 5 oz (97.7 kg)  12/06/18 215 lb 5 oz (97.7 kg)  11/30/18 214 lb 4.8 oz (97.2 kg)       History of Present Illness: Gary [name] is a 69 y.o. male  [place] a h/o afib and atrial flutter. He had a nonischemic (tachycardiac mediated) cardiomyopathy in 2005. He underwent atrial flutter ablation by Dr Lovena Le in 2007. His EF recovered and he did well. He developed atrial fibrillation and required cardioversion. He has been on flecainide for several years and has not had any arrhythmias to his knowledge. About 4 weeks ago, he was moving his cousins and was quite stressed out. He had also consumed more than his usual ETOH (typically 3-4 drinks per night). He developed afib. He reports irregular pulse, fatigue and decreased exercise tolerance. He was evaluated by Dr Irish Lack and placed on eliquis. He was planned for TEE guided cardioversion 06/23/16 however the TEE probe could not be passed despite multiple attempts.  He eventually underwent cardioversion in June 2018. He was seen in the atrial fibrillation clinic and flecainide, diltiazem and Eliquis were continued.   He had some atypical chest pain a few months ago. In January 2019, he had a negative ETT.  In 2019, he had a cough that persisted.  He had a CXR and had atelectasis.  THis is better.  He has had cataract surgery.   He had admission for Tikosyn initiation. He had hip replacement in 2020- but timing of Tikosyn was off.  Since then, he notes more AFib sx.  More likely to happen at night.   Denies : Chest pain. Dizziness. Leg edema. Nitroglycerin use. Orthopnea.  Paroxysmal nocturnal dyspnea. Shortness of breath. Syncope.     Past Medical History:  Diagnosis Date  . Arthritis   . Atrial  flutter ([place]) 2007   s/p CTI ablation by Dr Lovena Le  . Back pain    resolved per pt 12/06/18  . Fibromyalgia   . GERD (gastroesophageal reflux disease)   . Hyperlipidemia   . Hypertension   . Insomnia    occasional per pt 12/06/18  . NICM (nonischemic cardiomyopathy) ([place])    a. 2005 - tachy mediated. normalized after return of NSR.  [name] Obesity   . Paroxysmal atrial fibrillation ([facility])   . Pneumonia    x 1  . Spinal stenosis     Past Surgical History:  Procedure Laterality Date  . ATRIAL FIBRILLATION ABLATION N/A 03/22/2018   Procedure: ATRIAL FIBRILLATION ABLATION;  Surgeon: Thompson Grayer, MD;  Location: [facility];  Service: Cardiovascular;  Laterality: N/A;  . atrial flutter ablation  06/10/2005   CTI ablation by Dr Lovena Le for atrial flutter  . CARDIOVERSION N/A 07/20/2016   Procedure: CARDIOVERSION;  Surgeon: Minus Breeding, MD;  Location: [facility]COPY;  Service: Cardiovascular;  Laterality: N/A;  . CARDIOVERSION N/A 12/13/2017   Procedure: CARDIOVERSION;  Surgeon: Thayer Headings, MD;  Location: [facility] ENDOSCOPY;  Service: Cardiovascular;  Laterality: N/A;  . CARDIOVERSION N/A 01/30/2018   Procedure: CARDIOVERSION;  Surgeon: Fay Records, MD;  Location: [place];  Service: Cardiovascular;  Laterality: N/A;  . CARDIOVERSION N/A 06/27/2018   Procedure: CARDIOVERSION;  Surgeon: Buford Dresser, MD;  Location: [facility];  Service: Cardiovascular;  Laterality: N/A;  . CARDIOVERSION N/A 08/16/2018   Procedure: CARDIOVERSION;  Surgeon: [name], MD;  Location: [facility];  Service: Cardiovascular;  Laterality: N/A;  . CARDIOVERSION N/A 08/28/2018   Procedure: CARDIOVERSION;  Surgeon: Thayer Headings, MD;  Location: [facility];  Service: Cardiovascular;  Laterality: N/A;  . COLONOSCOPY    . EYE SURGERY Bilateral 2019   cataracts removed  . TOTAL HIP ARTHROPLASTY Left 12/12/2018   Procedure: LEFT TOTAL HIP ARTHROPLASTY ANTERIOR APPROACH;  Surgeon: Mcarthur Rossetti, MD;  Location: [facility];  Service: Orthopedics;  Laterality: Left;  . WISDOM TOOTH EXTRACTION       Current Outpatient Medications  Medication Sig Dispense Refill  . acetaminophen (TYLENOL) 500 MG tablet Take 1,000 mg by mouth daily as needed for moderate pain.     [name] amitriptyline (ELAVIL) 50 MG tablet Take 50-75 mg by mouth at bedtime.     [name] atorvastatin (LIPITOR) 10 MG tablet Take 10 mg by mouth every evening.     . calcium carbonate (TUMS - DOSED IN MG ELEMENTAL CALCIUM) 500 MG chewable tablet Chew 2 tablets by mouth daily as needed for indigestion or heartburn.    . diltiazem (CARDIZEM CD) 180 MG 24 hr capsule Take 1 capsule (180 mg total) by mouth every evening. 90 capsule 2  . dofetilide (TIKOSYN) 500 MCG capsule Take 1 capsule (500 mcg total) by mouth 2 (two) times daily. 180 capsule 2  . ELIQUIS 5 MG TABS tablet TAKE 1 TABLET BY MOUTH TWICE DAILY 180 tablet 1  . famotidine (PEPCID) 20 MG tablet Take 20 mg by mouth daily as needed for heartburn or indigestion.    . methocarbamol (ROBAXIN) 500 MG tablet Take 1 tablet (500 mg total) by mouth every 6 (six) hours as needed for muscle spasms. 40 tablet 1  . metoprolol succinate (TOPROL-XL) 25 MG 24 hr tablet Take 1 tablet in the AM and 1/2 tablet in the PM (Patient taking differently: Take 12.5-25 mg by mouth See admin instructions. Take 25 mg by mouth in the morning and 12.5 mg in the evening) 135 tablet 2  . Multiple Vitamin (MULTIVITAMIN WITH MINERALS) TABS tablet Take 1 tablet by mouth daily.    . Omega-3 Fatty Acids (FISH OIL) 1200 MG CAPS Take 1,200 mg by mouth 2 (two) times daily.     [name] oxyCODONE (OXY IR/ROXICODONE) 5 MG immediate release tablet Take 1-2 tablets (5-10 mg total) by mouth every 4 (four) hours as needed for moderate pain (pain score 4-6). 40 tablet 0  . sildenafil (VIAGRA) 50 MG tablet Take 50 mg by mouth daily as needed for erectile dysfunction.    . Simethicone (GAS-X PO) Take 2 tablets by mouth daily as  needed (gas).    . valACYclovir (VALTREX) 1000 MG tablet Take 2,000 mg by mouth every 12 (twelve) hours as needed (cold sore).     . vitamin C (ASCORBIC ACID) 500 MG tablet Take 500 mg by mouth daily as needed (immune support).    . vitamin E (VITAMIN E) 400 UNIT capsule Take 400 Units by mouth daily.    [name] zolpidem (AMBIEN) 10 MG tablet Take 10 mg by mouth at bedtime as needed for sleep.     No current facility-administered medications for this visit.    Allergies:   Penicillins    Social History:  The patient  reports that he quit smoking about 17 years ago. His smoking use included cigarettes. He has a 3.50 pack-year  smoking history. He has never used smokeless tobacco. He reports previous alcohol use of about 3.0 - 4.0 standard drinks of alcohol per week. He reports that he does not use drugs.   Family History:  The patient's family history includes Heart attack in his father; Hypertension in his father and mother.    ROS:  Please see the history of present illness.   Otherwise, review of systems are positive for occasional palpitations.   All other systems are reviewed and negative.    PHYSICAL EXAM: VS:  There were no vitals taken for this visit. , BMI There is no height or weight on file to calculate BMI. GEN: Well nourished, well developed, in no acute distress  HEENT: normal  Neck: no JVD, carotid bruits, or masses Cardiac: RRR; no murmurs, rubs, or gallops,no edema  Respiratory:  clear to auscultation bilaterally, normal work of breathing GI: soft, nontender, nondistended, + BS MS: no deformity or atrophy  Skin: warm and dry, no rash Neuro:  Strength and sensation are intact Psych: euthymic mood, full affect   EKG:   The ekg ordered 11/2018 demonstrates AFib, higher than usual HR.   Recent Labs: 11/30/2018: Magnesium 2.4 12/13/2018: BUN 9; Creatinine, Ser 0.92; Hemoglobin 12.1; Platelets 179; Potassium 4.0; Sodium 136   Lipid Panel    Component Value Date/Time    CHOL 176 08/09/2010 0036   TRIG 234 (H) 08/09/2010 0036   HDL 46 08/09/2010 0036   CHOLHDL 3.8 08/09/2010 0036   VLDL 47 (H) 08/09/2010 0036   LDLCALC 83 08/09/2010 0036     Other studies Reviewed: Additional studies/ records that were reviewed today with results demonstrating: Was in AFib in 11/2018 when of of his Tikosyn..   ASSESSMENT AND PLAN:  1. AFib: s/p AFib ablation in 02/2018.  Tikosyn to maintain sinus rhythm.  Followed in atrial fibrillation clinic.  Has had follow-up with [name].  More palpitations of late, but sometimes machine will say he is in NSR.  Consider 3-14 day Zio patch if his palpitations worsen.  He is in NSR today.  Sinus rate is in the 60-70 range. Continue to abstain from alcohol.  Appears euvolemic.  2. Hyperlipidemia: Whole food, plant-based diet recommended.  Continue atorvastatin.  He is maintaining weight loss.  3. Anticoagulated: Eliquis for stroke prevention.  No bleeding issues.  Labs stable.   Current medicines are reviewed at length with the patient today.  The patient concerns regarding his medicines were addressed.  The following changes have been made:  No change  Labs/ tests ordered today include:  No orders of the defined types were placed in this encounter.   Recommend 150 minutes/week of aerobic exercise Low fat, low carb, high fiber diet recommended  Disposition:   FU in 1 year   Signed, Larae Grooms, MD  02/25/2019 2:41 PM    [facility] [address], [place], [place]  [address] Phone: [phone]; Fax: [phone]

## 2019-02-26 ENCOUNTER — Other Ambulatory Visit: Payer: Self-pay

## 2019-02-26 ENCOUNTER — Encounter: Payer: Self-pay | Admitting: Interventional Cardiology

## 2019-02-26 ENCOUNTER — Ambulatory Visit (INDEPENDENT_AMBULATORY_CARE_PROVIDER_SITE_OTHER): Payer: PPO | Admitting: Interventional Cardiology

## 2019-02-26 VITALS — BP 114/60 | HR 68 | Ht 73.0 in | Wt 214.0 lb

## 2019-02-26 DIAGNOSIS — Z8679 Personal history of other diseases of the circulatory system: Secondary | ICD-10-CM

## 2019-02-26 DIAGNOSIS — E782 Mixed hyperlipidemia: Secondary | ICD-10-CM | POA: Diagnosis not present

## 2019-02-26 DIAGNOSIS — I4819 Other persistent atrial fibrillation: Secondary | ICD-10-CM | POA: Diagnosis not present

## 2019-02-26 DIAGNOSIS — I10 Essential (primary) hypertension: Secondary | ICD-10-CM

## 2019-02-26 NOTE — Patient Instructions (Signed)

## 2019-03-02 ENCOUNTER — Ambulatory Visit: Payer: PPO

## 2019-03-04 ENCOUNTER — Ambulatory Visit: Payer: PPO

## 2019-03-13 DIAGNOSIS — H52202 Unspecified astigmatism, left eye: Secondary | ICD-10-CM | POA: Diagnosis not present

## 2019-03-13 DIAGNOSIS — H18513 Endothelial corneal dystrophy, bilateral: Secondary | ICD-10-CM | POA: Diagnosis not present

## 2019-03-13 DIAGNOSIS — Z961 Presence of intraocular lens: Secondary | ICD-10-CM | POA: Diagnosis not present

## 2019-03-13 DIAGNOSIS — H524 Presbyopia: Secondary | ICD-10-CM | POA: Diagnosis not present

## 2019-03-21 ENCOUNTER — Other Ambulatory Visit: Payer: Self-pay | Admitting: Nurse Practitioner

## 2019-03-21 ENCOUNTER — Other Ambulatory Visit: Payer: Self-pay | Admitting: Physician Assistant

## 2019-03-21 DIAGNOSIS — I480 Paroxysmal atrial fibrillation: Secondary | ICD-10-CM

## 2019-04-17 DIAGNOSIS — M5440 Lumbago with sciatica, unspecified side: Secondary | ICD-10-CM | POA: Diagnosis not present

## 2019-04-20 DIAGNOSIS — H10411 Chronic giant papillary conjunctivitis, right eye: Secondary | ICD-10-CM | POA: Diagnosis not present

## 2019-05-07 DIAGNOSIS — M1612 Unilateral primary osteoarthritis, left hip: Secondary | ICD-10-CM | POA: Diagnosis not present

## 2019-05-07 DIAGNOSIS — M4800 Spinal stenosis, site unspecified: Secondary | ICD-10-CM | POA: Diagnosis not present

## 2019-05-15 ENCOUNTER — Encounter: Payer: Self-pay | Admitting: Orthopaedic Surgery

## 2019-06-05 ENCOUNTER — Telehealth: Payer: Self-pay | Admitting: Orthopaedic Surgery

## 2019-06-05 NOTE — Telephone Encounter (Signed)
Called patient left message to return call to schedule an appointment for 6 month follow up visit with Dr. Ninfa Linden or Artis Delay

## 2019-06-14 ENCOUNTER — Ambulatory Visit (HOSPITAL_COMMUNITY)
Admission: RE | Admit: 2019-06-14 | Discharge: 2019-06-14 | Disposition: A | Discharge status: Home / Self Care | Payer: PPO | Source: Ambulatory Visit | Attending: Nurse Practitioner | Admitting: Nurse Practitioner

## 2019-06-14 ENCOUNTER — Encounter (HOSPITAL_COMMUNITY): Payer: Self-pay | Admitting: Nurse Practitioner

## 2019-06-14 ENCOUNTER — Other Ambulatory Visit: Payer: Self-pay

## 2019-06-14 VITALS — BP 116/62 | HR 90 | Ht 73.0 in | Wt 212.2 lb

## 2019-06-14 DIAGNOSIS — Z8249 Family history of ischemic heart disease and other diseases of the circulatory system: Secondary | ICD-10-CM | POA: Diagnosis not present

## 2019-06-14 DIAGNOSIS — Z87891 Personal history of nicotine dependence: Secondary | ICD-10-CM | POA: Diagnosis not present

## 2019-06-14 DIAGNOSIS — I4819 Other persistent atrial fibrillation: Secondary | ICD-10-CM | POA: Insufficient documentation

## 2019-06-14 DIAGNOSIS — I10 Essential (primary) hypertension: Secondary | ICD-10-CM | POA: Diagnosis not present

## 2019-06-14 DIAGNOSIS — Z96642 Presence of left artificial hip joint: Secondary | ICD-10-CM | POA: Insufficient documentation

## 2019-06-14 DIAGNOSIS — Z79899 Other long term (current) drug therapy: Secondary | ICD-10-CM | POA: Diagnosis not present

## 2019-06-14 DIAGNOSIS — E785 Hyperlipidemia, unspecified: Secondary | ICD-10-CM | POA: Insufficient documentation

## 2019-06-14 DIAGNOSIS — M797 Fibromyalgia: Secondary | ICD-10-CM | POA: Insufficient documentation

## 2019-06-14 DIAGNOSIS — Z7901 Long term (current) use of anticoagulants: Secondary | ICD-10-CM | POA: Diagnosis not present

## 2019-06-14 DIAGNOSIS — D6869 Other thrombophilia: Secondary | ICD-10-CM

## 2019-06-14 DIAGNOSIS — K219 Gastro-esophageal reflux disease without esophagitis: Secondary | ICD-10-CM | POA: Insufficient documentation

## 2019-06-14 DIAGNOSIS — M1990 Unspecified osteoarthritis, unspecified site: Secondary | ICD-10-CM | POA: Insufficient documentation

## 2019-06-14 LAB — BASIC METABOLIC PANEL
Anion gap: 10 (ref 5–15)
BUN: 15 mg/dL (ref 8–23)
CO2: 24 mmol/L (ref 22–32)
Calcium: 9 mg/dL (ref 8.9–10.3)
Chloride: 103 mmol/L (ref 98–111)
Creatinine, Ser: 0.9 mg/dL (ref 0.61–1.24)
GFR calc Af Amer: >60 mL/min (ref >60)
GFR calc non Af Amer: >60 mL/min (ref >60)
Glucose, Bld: 138 mg/dL — ABNORMAL HIGH (ref 70–99)
Potassium: 4.2 mmol/L (ref 3.5–5.1)
Sodium: 137 mmol/L (ref 135–145)

## 2019-06-14 LAB — MAGNESIUM: Magnesium: 2 mg/dL (ref 1.7–2.4)

## 2019-06-14 NOTE — Progress Notes (Signed)
Date:  06/14/2019   ID:  STIHL BRUSSO, DOB 01/02/1951, MRN [id number]     PCP:  [name], MD  Primary Cardiologist:  Irish Lack Primary Electrophysiologist:  Allred   CC: persistent  afib   History of Present Illness: HALO WILTFONG is a 69 y.o. male who presents for dofetilide admission today.  Mr. Stoessel had an afib ablation 03/22/18 with protracted pneumonia following the procedure. Because of this, he continued to have issues with afib. Then because of covid restrictions his cardioversion was delayed until 06/27/18. He returned to afib and had appointment with [name] 7/1. It was decided to pursue tikosyn to restore SR. He has tested covid negative.   F/u in afib clinic, 06/14/19. He is in SR with continued use of dofetilide. A few palpitations off and on , none sustained. He had his hip replaced last fall. HE has intentionally had weight loss and has given up alcohol he feels well. CHA2DS2VASc score of at least 2, continues on eliquis 5 mg bid. Has had both covid shots.  Today, he denies symptoms of palpitations, chest pain, shortness of breath, orthopnea, PND, lower extremity edema, claudication, dizziness, presyncope, syncope, bleeding, or neurologic sequela. The patient is tolerating medications without difficulties and is otherwise without complaint today.  he denies symptoms of cough, fevers, chills, or new SOB worrisome for COVID 19.     he has a BMI of Body mass index is 28 kg/m.[name] Filed Weights   06/14/19 1339  Weight: 96.3 kg    Past Medical History:  Diagnosis Date  . Arthritis   . Atrial flutter ([place]) 2007   s/p CTI ablation by Dr Lovena Le  . Back pain    resolved per pt 12/06/18  . Fibromyalgia   . GERD (gastroesophageal reflux disease)   . Hyperlipidemia   . Hypertension   . Insomnia    occasional per pt 12/06/18  . NICM (nonischemic cardiomyopathy) ([place])    a. 2005 - tachy mediated. normalized after return of NSR.  [name] Obesity   . Paroxysmal  atrial fibrillation (HCC)   . Pneumonia    x 1  . Spinal stenosis    Past Surgical History:  Procedure Laterality Date  . ATRIAL FIBRILLATION ABLATION N/A 03/22/2018   Procedure: ATRIAL FIBRILLATION ABLATION;  Surgeon: Thompson Grayer, MD;  Location: [place] [facility];  Service: Cardiovascular;  Laterality: N/A;  . atrial flutter ablation  06/10/2005   CTI ablation by Dr Lovena Le for atrial flutter  . CARDIOVERSION N/A 07/20/2016   Procedure: CARDIOVERSION;  Surgeon: Minus Breeding, MD;  Location: [facility] ENDOSCOPY;  Service: Cardiovascular;  Laterality: N/A;  . CARDIOVERSION N/A 12/13/2017   Procedure: CARDIOVERSION;  Surgeon: Thayer Headings, MD;  Location: [place];  Service: Cardiovascular;  Laterality: N/A;  . CARDIOVERSION N/A 01/30/2018   Procedure: CARDIOVERSION;  Surgeon: Fay Records, MD;  Location: [facility];  Service: Cardiovascular;  Laterality: N/A;  . CARDIOVERSION N/A 06/27/2018   Procedure: CARDIOVERSION;  Surgeon: Buford Dresser, MD;  Location: [facility] ENDOSCOPY;  Service: Cardiovascular;  Laterality: N/A;  . CARDIOVERSION N/A 08/16/2018   Procedure: CARDIOVERSION;  Surgeon: [name], MD;  Location: [place];  Service: Cardiovascular;  Laterality: N/A;  . CARDIOVERSION N/A 08/28/2018   Procedure: CARDIOVERSION;  Surgeon: Acie Fredrickson Wonda Cheng, MD;  Location: [place];  Service: Cardiovascular;  Laterality: N/A;  . COLONOSCOPY    . EYE SURGERY Bilateral 2019   cataracts removed  . TOTAL HIP ARTHROPLASTY Left  12/12/2018   Procedure: LEFT TOTAL HIP ARTHROPLASTY ANTERIOR APPROACH;  Surgeon: Mcarthur Rossetti, MD;  Location: [place];  Service: Orthopedics;  Laterality: Left;  . WISDOM TOOTH EXTRACTION       Current Outpatient Medications  Medication Sig Dispense Refill  . acetaminophen (TYLENOL) 500 MG tablet Take 1,000 mg by mouth daily as needed for moderate pain.     [name] amitriptyline (ELAVIL) 50 MG tablet Take 50-75 mg by mouth at bedtime.     [name]  atorvastatin (LIPITOR) 10 MG tablet Take 10 mg by mouth every evening.     . calcium carbonate (TUMS - DOSED IN MG ELEMENTAL CALCIUM) 500 MG chewable tablet Chew 2 tablets by mouth daily as needed for indigestion or heartburn.    . diltiazem (CARDIZEM CD) 180 MG 24 hr capsule Take 1 capsule (180 mg total) by mouth every evening. 90 capsule 2  . dofetilide (TIKOSYN) 500 MCG capsule TAKE 1 CAPSULE(500 MCG) BY MOUTH TWICE DAILY. 180 capsule 3  . ELIQUIS 5 MG TABS tablet TAKE 1 TABLET BY MOUTH TWICE DAILY 180 tablet 1  . famotidine (PEPCID) 20 MG tablet Take 20 mg by mouth daily as needed for heartburn or indigestion.    . metoprolol succinate (TOPROL-XL) 25 MG 24 hr tablet TAKE 1 TABLET BY MOUTH EVERY MORNING AND 1/2 TABLET EVERY EVENING 135 tablet 2  . Multiple Vitamin (MULTIVITAMIN WITH MINERALS) TABS tablet Take 1 tablet by mouth daily.    . Omega-3 Fatty Acids (FISH OIL) 1200 MG CAPS Take 1,200 mg by mouth 2 (two) times daily.     . sildenafil (VIAGRA) 50 MG tablet Take 50 mg by mouth daily as needed for erectile dysfunction.    . Simethicone (GAS-X PO) Take 2 tablets by mouth daily as needed (gas).    . valACYclovir (VALTREX) 1000 MG tablet Take 2,000 mg by mouth every 12 (twelve) hours as needed (cold sore).     . vitamin E (VITAMIN E) 400 UNIT capsule Take 400 Units by mouth daily.    [name] zolpidem (AMBIEN) 10 MG tablet Take 10 mg by mouth at bedtime as needed for sleep.     No current facility-administered medications for this encounter.    Allergies:   Penicillins   Social History:  The patient  reports that he quit smoking about 17 years ago. His smoking use included cigarettes. He has a 3.50 pack-year smoking history. He has never used smokeless tobacco. He reports current alcohol use of about 3.0 - 4.0 standard drinks of alcohol per week. He reports that he does not use drugs.   Family History:  The patient's  family history includes Heart attack in his father; Hypertension in his father  and mother.    ROS:  Please see the history of present illness.   All other systems are personally reviewed and negative.   Exam: GEN- The patient is well appearing, alert and oriented x 3 today.   Head- normocephalic, atraumatic Eyes-  Sclera clear, conjunctiva pink Ears- hearing intact Oropharynx- clear Neck- supple, no JVP Lymph- no cervical lymphadenopathy Lungs- Clear to ausculation bilaterally, normal work of breathing Heart- regular rate and rhythm, no murmurs, rubs or gallops, PMI not laterally displaced GI- soft, NT, ND, + BS Extremities- no clubbing, cyanosis, or edema MS- no significant deformity or atrophy Skin- no rash or lesion Psych- euthymic mood, full affect Neuro- strength and sensation are intact  Recent Labs: 11/30/2018: Magnesium 2.4 12/13/2018: BUN 9; Creatinine, Ser 0.92; Hemoglobin 12.1; Platelets  179; Potassium 4.0; Sodium 136  personally reviewed    Other studies personally reviewed: Additional studies/ records that were reviewed today include: Epic records EKG-SR at 90 bpm, pr int 208 ms, qrs int 90 ms, qtc 442 ms    ASSESSMENT AND PLAN:  1. Persistent  atrial fibrillation S/p ablation afib  [id number] complicated by protracted pneumonia and now failed DCCV Failed flecainide  H/o atrial flutter ablation as well Now doing well staying in SR on  tikosyn Continue eliqius 5 mg bid, states no missed doses for the last 3 weeks This patients CHA2DS2-VASc Score and unadjusted Ischemic Stroke Rate (% per year) is equal to 2.2 % stroke rate/year from a score of 2  F/u in afib clinic in 4 months   Labs/ tests ordered today include: none Orders Placed This Encounter  Procedures  . Magnesium  . Basic metabolic panel  . EKG 12-Lead    Signed, Roderic Palau NP  06/14/2019 2:03 PM  [facility] [address] [place], [place] [address] [phone]

## 2019-06-15 ENCOUNTER — Other Ambulatory Visit: Payer: Self-pay | Admitting: *Deleted

## 2019-06-15 DIAGNOSIS — I480 Paroxysmal atrial fibrillation: Secondary | ICD-10-CM

## 2019-06-15 MED ORDER — DILTIAZEM HCL ER COATED BEADS 180 MG PO CP24: 180.0000 mg | ORAL_CAPSULE | Freq: Every evening | ORAL | 2 refills | Status: DC

## 2019-07-04 ENCOUNTER — Ambulatory Visit: Payer: PPO | Admitting: Orthopaedic Surgery

## 2019-07-04 ENCOUNTER — Other Ambulatory Visit: Payer: Self-pay

## 2019-07-04 ENCOUNTER — Encounter: Payer: Self-pay | Admitting: Orthopaedic Surgery

## 2019-07-04 ENCOUNTER — Ambulatory Visit (INDEPENDENT_AMBULATORY_CARE_PROVIDER_SITE_OTHER): Payer: PPO

## 2019-07-04 VITALS — Ht 73.0 in | Wt 207.0 lb

## 2019-07-04 DIAGNOSIS — M25552 Pain in left hip: Secondary | ICD-10-CM

## 2019-07-04 DIAGNOSIS — Z96642 Presence of left artificial hip joint: Secondary | ICD-10-CM

## 2019-07-04 NOTE — Progress Notes (Signed)
The patient is now almost 7 months status post a left total hip arthroplasty.  He says he is doing well with his hip and has no complaints.  On exam his leg lengths are equal.  I put his left hip through internal extra rotation as well as flexion extension causes no pain at all.  A low AP pelvis and lateral of his left hip shows a well-seated implant with no complicating features.  His right hip appears normal.  At this point follow-up can be as needed since he is having no issues.  He understands that if he does develop a dull aching pain with that hip he would need to see [place].  We can also see him for anything else.  We have seen him for shoulder before but he says is not bothersome enough to do anything else about it.  If anything becomes problematic he knows to let us know.

## 2019-07-15 ENCOUNTER — Other Ambulatory Visit: Payer: Self-pay | Admitting: Interventional Cardiology

## 2019-07-16 NOTE — Telephone Encounter (Signed)
Pt last saw Roderic Palau, NP on 06/14/19, last labs 06/14/19 Creat 0.90, age 69, weight 93.9kg, based on specified criteria pt is on appropriate dosage of Eliquis 5mg  BID.  Will refill rx.

## 2019-08-03 DIAGNOSIS — G4700 Insomnia, unspecified: Secondary | ICD-10-CM | POA: Diagnosis not present

## 2019-08-03 DIAGNOSIS — E669 Obesity, unspecified: Secondary | ICD-10-CM | POA: Diagnosis not present

## 2019-08-03 DIAGNOSIS — M419 Scoliosis, unspecified: Secondary | ICD-10-CM | POA: Diagnosis not present

## 2019-08-03 DIAGNOSIS — R7301 Impaired fasting glucose: Secondary | ICD-10-CM | POA: Diagnosis not present

## 2019-08-03 DIAGNOSIS — E782 Mixed hyperlipidemia: Secondary | ICD-10-CM | POA: Diagnosis not present

## 2019-08-03 DIAGNOSIS — R7309 Other abnormal glucose: Secondary | ICD-10-CM | POA: Diagnosis not present

## 2019-08-03 DIAGNOSIS — I4891 Unspecified atrial fibrillation: Secondary | ICD-10-CM | POA: Diagnosis not present

## 2019-08-03 DIAGNOSIS — I119 Hypertensive heart disease without heart failure: Secondary | ICD-10-CM | POA: Diagnosis not present

## 2019-10-13 ENCOUNTER — Encounter (HOSPITAL_COMMUNITY): Payer: Self-pay | Admitting: Emergency Medicine

## 2019-10-13 ENCOUNTER — Emergency Department (HOSPITAL_COMMUNITY): Payer: PPO

## 2019-10-13 ENCOUNTER — Emergency Department (HOSPITAL_COMMUNITY)
Admission: EM | Admit: 2019-10-13 | Discharge: 2019-10-13 | Disposition: A | Discharge status: Home / Self Care | Payer: PPO | Attending: Emergency Medicine | Admitting: Emergency Medicine

## 2019-10-13 DIAGNOSIS — M545 Low back pain: Secondary | ICD-10-CM | POA: Diagnosis not present

## 2019-10-13 DIAGNOSIS — F17210 Nicotine dependence, cigarettes, uncomplicated: Secondary | ICD-10-CM | POA: Diagnosis not present

## 2019-10-13 DIAGNOSIS — M48061 Spinal stenosis, lumbar region without neurogenic claudication: Secondary | ICD-10-CM | POA: Diagnosis not present

## 2019-10-13 DIAGNOSIS — I700 Atherosclerosis of aorta: Secondary | ICD-10-CM | POA: Diagnosis not present

## 2019-10-13 DIAGNOSIS — X58XXXA Exposure to other specified factors, initial encounter: Secondary | ICD-10-CM | POA: Diagnosis not present

## 2019-10-13 DIAGNOSIS — M5489 Other dorsalgia: Secondary | ICD-10-CM | POA: Diagnosis not present

## 2019-10-13 DIAGNOSIS — M4186 Other forms of scoliosis, lumbar region: Secondary | ICD-10-CM | POA: Diagnosis not present

## 2019-10-13 DIAGNOSIS — R5381 Other malaise: Secondary | ICD-10-CM | POA: Diagnosis not present

## 2019-10-13 DIAGNOSIS — I10 Essential (primary) hypertension: Secondary | ICD-10-CM | POA: Insufficient documentation

## 2019-10-13 DIAGNOSIS — S39012A Strain of muscle, fascia and tendon of lower back, initial encounter: Secondary | ICD-10-CM | POA: Diagnosis not present

## 2019-10-13 DIAGNOSIS — Z96642 Presence of left artificial hip joint: Secondary | ICD-10-CM | POA: Diagnosis not present

## 2019-10-13 DIAGNOSIS — I499 Cardiac arrhythmia, unspecified: Secondary | ICD-10-CM | POA: Diagnosis not present

## 2019-10-13 DIAGNOSIS — M62830 Muscle spasm of back: Secondary | ICD-10-CM | POA: Diagnosis not present

## 2019-10-13 DIAGNOSIS — Z79899 Other long term (current) drug therapy: Secondary | ICD-10-CM | POA: Diagnosis not present

## 2019-10-13 DIAGNOSIS — S32039A Unspecified fracture of third lumbar vertebra, initial encounter for closed fracture: Secondary | ICD-10-CM | POA: Diagnosis not present

## 2019-10-13 LAB — COMPREHENSIVE METABOLIC PANEL WITH GFR
ALT: 29 U/L (ref 0–44)
AST: 22 U/L (ref 15–41)
Albumin: 4 g/dL (ref 3.5–5.0)
Alkaline Phosphatase: 61 U/L (ref 38–126)
Anion gap: 6 (ref 5–15)
BUN: 15 mg/dL (ref 8–23)
CO2: 27 mmol/L (ref 22–32)
Calcium: 9 mg/dL (ref 8.9–10.3)
Chloride: 102 mmol/L (ref 98–111)
Creatinine, Ser: 0.78 mg/dL (ref 0.61–1.24)
GFR calc Af Amer: >60 mL/min
GFR calc non Af Amer: >60 mL/min
Glucose, Bld: 122 mg/dL — ABNORMAL HIGH (ref 70–99)
Potassium: 4.6 mmol/L (ref 3.5–5.1)
Sodium: 135 mmol/L (ref 135–145)
Total Bilirubin: 0.8 mg/dL (ref 0.3–1.2)
Total Protein: 7.1 g/dL (ref 6.5–8.1)

## 2019-10-13 LAB — CBC WITH DIFFERENTIAL/PLATELET
Abs Immature Granulocytes: 0.02 K/uL (ref 0.00–0.07)
Basophils Absolute: 0 K/uL (ref 0.0–0.1)
Basophils Relative: 1 %
Eosinophils Absolute: 0 K/uL (ref 0.0–0.5)
Eosinophils Relative: 0 %
HCT: 47.7 % (ref 39.0–52.0)
Hemoglobin: 16.3 g/dL (ref 13.0–17.0)
Immature Granulocytes: 0 %
Lymphocytes Relative: 14 %
Lymphs Abs: 1.2 K/uL (ref 0.7–4.0)
MCH: 32.1 pg (ref 26.0–34.0)
MCHC: 34.2 g/dL (ref 30.0–36.0)
MCV: 93.9 fL (ref 80.0–100.0)
Monocytes Absolute: 0.6 K/uL (ref 0.1–1.0)
Monocytes Relative: 7 %
Neutro Abs: 6.6 K/uL (ref 1.7–7.7)
Neutrophils Relative %: 78 %
Platelets: 190 K/uL (ref 150–400)
RBC: 5.08 MIL/uL (ref 4.22–5.81)
RDW: 13.8 % (ref 11.5–15.5)
WBC: 8.4 K/uL (ref 4.0–10.5)
nRBC: 0 % (ref 0.0–0.2)

## 2019-10-13 LAB — URINALYSIS, ROUTINE W REFLEX MICROSCOPIC
Bilirubin Urine: NEGATIVE
Glucose, UA: NEGATIVE mg/dL
Hgb urine dipstick: NEGATIVE
Ketones, ur: NEGATIVE mg/dL
Leukocytes,Ua: NEGATIVE
Nitrite: NEGATIVE
Protein, ur: NEGATIVE mg/dL
Specific Gravity, Urine: 1.013 (ref 1.005–1.030)
pH: 8 (ref 5.0–8.0)

## 2019-10-13 MED ORDER — CYCLOBENZAPRINE HCL 10 MG PO TABS
10.0000 mg | ORAL_TABLET | Freq: Once | ORAL | Status: AC
  Administered 2019-10-13: 10 mg via ORAL
  Filled 2019-10-13: qty 1

## 2019-10-13 MED ORDER — HYDROCODONE-ACETAMINOPHEN 5-325 MG PO TABS: 1.0000 | ORAL_TABLET | Freq: Four times a day (QID) | ORAL | 0 refills | Status: DC | PRN

## 2019-10-13 MED ORDER — CYCLOBENZAPRINE HCL 10 MG PO TABS: 10.0000 mg | ORAL_TABLET | Freq: Two times a day (BID) | ORAL | 0 refills | Status: DC | PRN

## 2019-10-13 MED ORDER — OXYCODONE-ACETAMINOPHEN 5-325 MG PO TABS
1.0000 | ORAL_TABLET | Freq: Once | ORAL | Status: AC
  Administered 2019-10-13: 1 via ORAL
  Filled 2019-10-13: qty 1

## 2019-10-13 MED ORDER — LIDOCAINE 5 % EX PTCH
1.0000 | MEDICATED_PATCH | CUTANEOUS | Status: DC
  Administered 2019-10-13: 1 via TRANSDERMAL
  Filled 2019-10-13: qty 1

## 2019-10-13 MED ORDER — DIAZEPAM 5 MG PO TABS
5.0000 mg | ORAL_TABLET | Freq: Once | ORAL | Status: AC
  Administered 2019-10-13: 5 mg via ORAL
  Filled 2019-10-13: qty 1

## 2019-10-13 MED ORDER — LIDOCAINE 5 % EX PTCH: 1.0000 | MEDICATED_PATCH | CUTANEOUS | 0 refills | Status: DC

## 2019-10-13 NOTE — ED Notes (Signed)
ED Provider at bedside. 

## 2019-10-13 NOTE — ED Provider Notes (Signed)
Gary [name] Provider Note   CSN: [id number] Arrival date & time: 10/13/19  1225     History Chief Complaint  Patient presents with  . Back Pain    Gary [name] is a 69 y.o. male.  The history is provided by the patient and medical records. No language interpreter was used.  Back Pain Location:  Lumbar spine Quality:  Aching, cramping and stabbing Radiates to:  Does not radiate Pain severity:  Severe Pain is:  Same all the time Onset quality:  Sudden Duration:  1 day Timing:  Intermittent Progression:  Waxing and waning Chronicity:  New Context: twisting   Relieved by:  Lying down and being still Worsened by:  Palpation, standing, twisting, touching and movement Ineffective treatments:  None tried Associated symptoms: no abdominal pain, no chest pain, no dysuria, no fever, no headaches, no leg pain, no numbness, no paresthesias, no perianal numbness and no tingling        Past Medical History:  Diagnosis Date  . Arthritis   . Atrial flutter ([place]) 2007   s/p CTI ablation by Dr Lovena Le  . Back pain    resolved per pt 12/06/18  . Fibromyalgia   . GERD (gastroesophageal reflux disease)   . Hyperlipidemia   . Hypertension   . Insomnia    occasional per pt 12/06/18  . NICM (nonischemic cardiomyopathy) ([place])    a. 2005 - tachy mediated. normalized after return of NSR.  [name] Obesity   . Paroxysmal atrial fibrillation (HCC)   . Pneumonia    x 1  . Spinal stenosis     Patient Active Problem List   Diagnosis Date Noted  . Unilateral primary osteoarthritis, left hip 12/12/2018  . Status post total replacement of left hip 12/12/2018  . Persistent atrial fibrillation ([place]) 03/22/2018  . 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 Left hip pain 09/13/2016  . Family history of cardiovascular disease 05/21/2015  . Obesity   . Hyperlipidemia   . A-fib ([place])   . Atrial flutter ([place])   . Insomnia   . Heart disease   . Back pain   .  Cardiomyopathy [facility])     Past Surgical History:  Procedure Laterality Date  . ATRIAL FIBRILLATION ABLATION N/A 03/22/2018   Procedure: ATRIAL FIBRILLATION ABLATION;  Surgeon: Thompson Grayer, MD;  Location: [place] CV LAB;  Service: Cardiovascular;  Laterality: N/A;  . atrial flutter ablation  06/10/2005   CTI ablation by Dr Lovena Le for atrial flutter  . CARDIOVERSION N/A 07/20/2016   Procedure: CARDIOVERSION;  Surgeon: Minus Breeding, MD;  Location: [facility] ENDOSCOPY;  Service: Cardiovascular;  Laterality: N/A;  . CARDIOVERSION N/A 12/13/2017   Procedure: CARDIOVERSION;  Surgeon: Thayer Headings, MD;  Location: [place];  Service: Cardiovascular;  Laterality: N/A;  . CARDIOVERSION N/A 01/30/2018   Procedure: CARDIOVERSION;  Surgeon: Fay Records, MD;  Location: [place];  Service: Cardiovascular;  Laterality: N/A;  . CARDIOVERSION N/A 06/27/2018   Procedure: CARDIOVERSION;  Surgeon: Buford Dresser, MD;  Location: [facility] ENDOSCOPY;  Service: Cardiovascular;  Laterality: N/A;  . CARDIOVERSION N/A 08/16/2018   Procedure: CARDIOVERSION;  Surgeon: [name], MD;  Location: [place];  Service: Cardiovascular;  Laterality: N/A;  . CARDIOVERSION N/A 08/28/2018   Procedure: CARDIOVERSION;  Surgeon: Acie Fredrickson Wonda Cheng, MD;  Location: [place];  Service: Cardiovascular;  Laterality: N/A;  . COLONOSCOPY    . EYE SURGERY Bilateral 2019   cataracts removed  . TOTAL HIP ARTHROPLASTY Left 12/12/2018  Procedure: LEFT TOTAL HIP ARTHROPLASTY ANTERIOR APPROACH;  Surgeon: Mcarthur Rossetti, MD;  Location: [place];  Service: Orthopedics;  Laterality: Left;  . WISDOM TOOTH EXTRACTION         Family History  Problem Relation Age of Onset  . Heart attack Father   . Hypertension Father   . Hypertension Mother     Social History   Tobacco Use  . Smoking status: Former Smoker    Packs/day: 0.50    Years: 7.00    Pack years: 3.50    Types: Cigarettes    Quit date: 2004    Years  since quitting: 17.7  . Smokeless tobacco: Never Used  Vaping Use  . Vaping Use: Never used  Substance Use Topics  . Alcohol use: Yes    Alcohol/week: 3.0 - 4.0 standard drinks    Types: 3 - 4 Glasses of wine per week    Comment: since 2020 cut back to 1-2 drinks per week  . Drug use: No    Home Medications Prior to Admission medications   Medication Sig Start Date End Date Taking? Authorizing Provider  acetaminophen (TYLENOL) 500 MG tablet Take 1,000 mg by mouth daily as needed for moderate pain.     [provider]  amitriptyline (ELAVIL) 50 MG tablet Take 50-75 mg by mouth at bedtime.     [provider]  atorvastatin (LIPITOR) 10 MG tablet Take 10 mg by mouth every evening.     [provider]  calcium carbonate (TUMS - DOSED IN MG ELEMENTAL CALCIUM) 500 MG chewable tablet Chew 2 tablets by mouth daily as needed for indigestion or heartburn.    [provider]  diltiazem (CARDIZEM CD) 180 MG 24 hr capsule Take 1 capsule (180 mg total) by mouth every evening. 06/15/19   Sherran Needs, NP  dofetilide (TIKOSYN) 500 MCG capsule TAKE 1 CAPSULE(500 MCG) BY MOUTH TWICE DAILY. 03/21/19   Allred, Jeneen Rinks, MD  ELIQUIS 5 MG TABS tablet TAKE 1 TABLET BY MOUTH TWICE DAILY 07/16/19   Jettie Booze, MD  famotidine (PEPCID) 20 MG tablet Take 20 mg by mouth daily as needed for heartburn or indigestion.    [provider]  metoprolol succinate (TOPROL-XL) 25 MG 24 hr tablet TAKE 1 TABLET BY MOUTH EVERY MORNING AND 1/2 TABLET EVERY EVENING 03/21/19   Allred, Jeneen Rinks, MD  Multiple Vitamin (MULTIVITAMIN WITH MINERALS) TABS tablet Take 1 tablet by mouth daily.    [provider]  Omega-3 Fatty Acids (FISH OIL) 1200 MG CAPS Take 1,200 mg by mouth 2 (two) times daily.     [provider]  sildenafil (VIAGRA) 50 MG tablet Take 50 mg by mouth daily as needed for erectile dysfunction.    [provider]  Simethicone (GAS-X PO) Take 2  tablets by mouth daily as needed (gas).    [provider]  valACYclovir (VALTREX) 1000 MG tablet Take 2,000 mg by mouth every 12 (twelve) hours as needed (cold sore).  12/04/12   [provider]  vitamin E (VITAMIN E) 400 UNIT capsule Take 400 Units by mouth daily.    [provider]  zolpidem (AMBIEN) 10 MG tablet Take 10 mg by mouth at bedtime as needed for sleep.    [provider]    Allergies    Penicillins  Review of Systems   Review of Systems  Constitutional: Negative for chills, diaphoresis, fatigue and fever.  HENT: Negative for congestion and rhinorrhea.   Eyes: Negative  for visual disturbance.  Respiratory: Negative for cough, chest tightness, shortness of breath and wheezing.   Cardiovascular: Negative for chest pain, palpitations and leg swelling.  Gastrointestinal: Negative for abdominal pain, constipation, diarrhea, nausea and vomiting.  Genitourinary: Negative for dysuria, flank pain and frequency.  Musculoskeletal: Positive for back pain. Negative for neck pain and neck stiffness.  Skin: Negative for rash and wound.  Neurological: Negative for tingling, light-headedness, numbness, headaches and paresthesias.  Psychiatric/Behavioral: Negative for agitation and confusion.  All other systems reviewed and are negative.   Physical Exam Updated Vital Signs BP 137/78 (BP Location: Right Arm)   Pulse 64   Temp 98.4 F (36.9 C) (Oral)   Resp 20   SpO2 100%   Physical Exam Vitals and nursing note reviewed.  Constitutional:      General: He is not in acute distress.    Appearance: He is well-developed. He is not ill-appearing, toxic-appearing or diaphoretic.  HENT:     Head: Normocephalic and atraumatic.     Nose: Nose normal. No congestion or rhinorrhea.     Mouth/Throat:     Mouth: Mucous membranes are moist.     Pharynx: No oropharyngeal exudate or posterior oropharyngeal erythema.  Eyes:     Extraocular Movements:  Extraocular movements intact.     Conjunctiva/sclera: Conjunctivae normal.     Pupils: Pupils are equal, round, and reactive to light.  Cardiovascular:     Rate and Rhythm: Normal rate and regular rhythm.     Heart sounds: No murmur heard.   Pulmonary:     Effort: Pulmonary effort is normal. No respiratory distress.     Breath sounds: Normal breath sounds. No wheezing, rhonchi or rales.  Chest:     Chest wall: No tenderness.  Abdominal:     General: Abdomen is flat.     Palpations: Abdomen is soft.     Tenderness: There is no abdominal tenderness. There is no right CVA tenderness, left CVA tenderness, guarding or rebound.  Musculoskeletal:        General: Tenderness present.     Cervical back: Neck supple. No tenderness.     Lumbar back: Spasms and tenderness present. No signs of trauma, lacerations or bony tenderness. Negative right straight leg raise test and negative left straight leg raise test.     Right lower leg: No edema.     Left lower leg: No edema.  Skin:    General: Skin is warm and dry.     Capillary Refill: Capillary refill takes less than 2 seconds.     Findings: No rash.  Neurological:     General: No focal deficit present.     Mental Status: He is alert.  Psychiatric:        Mood and Affect: Mood normal.     ED Results / Procedures / Treatments   Labs (all labs ordered are listed, but only abnormal results are displayed) Labs Reviewed  COMPREHENSIVE METABOLIC PANEL - Abnormal; Notable for the following components:      Result Value   Glucose, Bld 122 (*)    All other components within normal limits  URINE CULTURE  CBC WITH DIFFERENTIAL/PLATELET  URINALYSIS, ROUTINE W REFLEX MICROSCOPIC    EKG None  Radiology No results found.  Procedures Procedures (including critical care time)  Medications Ordered in ED Medications  lidocaine (LIDODERM) 5 % 1 patch (1 patch Transdermal Patch Applied 10/13/19 1356)  cyclobenzaprine (FLEXERIL) tablet 10 mg  (10 mg Oral Given 10/13/19 1356)  ED Course  I have reviewed the triage vital signs and the nursing notes.  Pertinent labs & imaging results that were available during my care of the patient were reviewed by me and considered in my medical decision making (see chart for details).    MDM Rules/Calculators/A&P                          Gary [name] is a 69 y.o. male with a past medical history significant for atrial fibrillation on Eliquis, fibromyalgia, GERD, hypertension, hyperlipidemia, known spinal stenosis, and cardiomyopathy who presents with severe low back pain.  Patient reports that over the last week he has had intermittent episodes of right low back pain that have come and gone but feels new compared to his previous low back pain episodes.  He says that this morning, after going to the bathroom, he stood up and had sudden onset of severe low back pain.  He was barely able to hobble back to the bed where he was unable to ambulate.  He denies any new numbness, tingling, or weakness.  He denies any loss of bowel or bladder control.  Reports the pain was up to 8 out of 10 at rest and worsened with any movement initially.  He denies any other preceding symptoms such as chest pain, shortness of breath, palpitations, nausea, vomiting, constipation, diarrhea, or urinary symptoms.  Denies any rashes.  Denies other complaints.  Was very concerned given his known spinal stenosis history and this severe different type of pain.  On exam, patient had negative straight leg raise.  Patient did have tenderness in his paraspinal right low back he did not tenderness in the midline.  No rash seen.  No other CVA tenderness.  Abdomen nontender.  Bowel sounds normal.  Lungs clear chest nontender.  Good pulses in extremities.  Normal sensation and strength in legs.  Different bodily movements causes pain to worsen.  Patient did have what felt to be a palpable muscle spasm in the right paraspinal low back.  Had a  shared decision-making conversation with patient and spouse.  They agreed to get a CT scan to look for any shifting or new abnormality given his known spinal disease.  We will also get screening labs and urinalysis given the location of his discomfort.  We will give a Lidoderm patch and use some Flexeril as discussed likely musculoskeletal pain with severe muscle spasms in the low back.  If imaging is reassuring and labs are reassuring and he is having improvement enough to be able to safely ambulate, anticipate discharge home with follow-up with his spine team.  Patient family agree with plan of care.  Care transferred Dr. Ron Parker while waiting for results of CT scan.  Dissipate discharge with muscle relaxant and Lidoderm patches to follow-up with his spine team if all is reassuring.   Final Clinical Impression(s) / ED Diagnoses Final diagnoses:  Strain of lumbar region, initial encounter  Muscle spasm of back     Clinical Impression: 1. Strain of lumbar region, initial encounter   2. Muscle spasm of back     Disposition: Care transferred Dr. Ron Parker while waiting for results of CT scan.  Dissipate discharge with muscle relaxant and Lidoderm patches to follow-up with his spine team if all is reassuring.   This note was prepared with assistance of Systems analyst. Occasional wrong-word or sound-a-like substitutions may have occurred due to the inherent limitations of voice recognition  software.               Muntaha Vermette, Gwenyth Allegra, MD 10/13/19 1515

## 2019-10-13 NOTE — ED Triage Notes (Signed)
Pt states that he had some back pain in his lower back this morning but approx. 30 minutes ago he "threw it out" and cannot walk. Hx of spinal stenosis. Alert and oriented.

## 2019-10-13 NOTE — ED Notes (Signed)
Condom cath. Placed on pt.

## 2019-10-13 NOTE — ED Provider Notes (Signed)
Medical Decision Making: Care of patient assumed from Dr. Sherry Ruffing at 1500.  Agree with history, physical exam and plan.  See their note for further details.  Briefly, The pt p/w back pain sudden onset last day, bending and twisting, history of back evaluation for surgery but has never had it, no fevers chills no neurologic dysfunction.  Getting CT scan..   Current plan is as follows: CT scan shows chronic changes the back with no acute findings, concern for distended bladder we will do a post void residual, though there is commentary that there is no incontinence  The patient complains of significant right-sided paraspinal muscle spasm anytime he extends his back.  He was able to get him up and ambulate with minimal support he was able to get himself back into bed.  He does have a walker at home that he thinks he could use to help ease into the positions.  He is given prescriptions for pain medication muscle relaxers and lidocaine patches.  He has a back Psychologist, sport and exercise.  He will call the back surgeon to follow-up figure out a long-term plan for his back pain.   I personally reviewed and interpreted all labs/imaging.      Breck Coons, MD 10/13/19 [phone]

## 2019-10-13 NOTE — Discharge Instructions (Addendum)
You can take 600 mg of ibuprofen every 6 hours, you can take 1000 mg of Tylenol every 6 hours, you can alternate these every 3 or you can take them together.  Follow-up with your spinal surgeon regarding future plan and disposition regarding surgery

## 2019-10-15 LAB — URINE CULTURE

## 2019-10-16 ENCOUNTER — Ambulatory Visit (HOSPITAL_COMMUNITY): Payer: PPO | Admitting: Nurse Practitioner

## 2019-10-18 ENCOUNTER — Telehealth: Payer: Self-pay | Admitting: Internal Medicine

## 2019-10-18 DIAGNOSIS — M5440 Lumbago with sciatica, unspecified side: Secondary | ICD-10-CM | POA: Diagnosis not present

## 2019-10-18 NOTE — Telephone Encounter (Signed)
There are no issues with taking an oral steroid like prednisone while on Tikosyn.

## 2019-10-18 NOTE — Telephone Encounter (Signed)
Pt c/o medication issue:  1. Name of Medication: dofetilide (TIKOSYN) 500 MCG capsule  2. How are you currently taking this medication (dosage and times per day)?  3. Are you having a reaction (difficulty breathing--STAT)?   4. What is your medication issue? Patient wants to know if it will be okay to take an oral steroid while he is taking tikosyn.

## 2019-10-18 NOTE — Telephone Encounter (Signed)
Called patient with recommendations from [facility]. Patient verbalized understanding.

## 2019-10-23 DIAGNOSIS — M545 Low back pain: Secondary | ICD-10-CM | POA: Diagnosis not present

## 2019-10-23 DIAGNOSIS — M5440 Lumbago with sciatica, unspecified side: Secondary | ICD-10-CM | POA: Diagnosis not present

## 2019-10-23 DIAGNOSIS — M4150 Other secondary scoliosis, site unspecified: Secondary | ICD-10-CM | POA: Diagnosis not present

## 2019-10-31 ENCOUNTER — Ambulatory Visit (HOSPITAL_COMMUNITY)
Admission: RE | Admit: 2019-10-31 | Discharge: 2019-10-31 | Disposition: A | Discharge status: Home / Self Care | Payer: PPO | Source: Ambulatory Visit | Attending: Nurse Practitioner | Admitting: Nurse Practitioner

## 2019-10-31 ENCOUNTER — Other Ambulatory Visit: Payer: Self-pay

## 2019-10-31 ENCOUNTER — Encounter (HOSPITAL_COMMUNITY): Payer: Self-pay | Admitting: Nurse Practitioner

## 2019-10-31 VITALS — BP 130/66 | HR 66 | Ht 75.0 in | Wt 215.2 lb

## 2019-10-31 DIAGNOSIS — D6869 Other thrombophilia: Secondary | ICD-10-CM

## 2019-10-31 DIAGNOSIS — M797 Fibromyalgia: Secondary | ICD-10-CM | POA: Insufficient documentation

## 2019-10-31 DIAGNOSIS — Z79899 Other long term (current) drug therapy: Secondary | ICD-10-CM | POA: Diagnosis not present

## 2019-10-31 DIAGNOSIS — Z96642 Presence of left artificial hip joint: Secondary | ICD-10-CM | POA: Diagnosis not present

## 2019-10-31 DIAGNOSIS — I4819 Other persistent atrial fibrillation: Secondary | ICD-10-CM | POA: Insufficient documentation

## 2019-10-31 DIAGNOSIS — I10 Essential (primary) hypertension: Secondary | ICD-10-CM | POA: Diagnosis not present

## 2019-10-31 DIAGNOSIS — Z87891 Personal history of nicotine dependence: Secondary | ICD-10-CM | POA: Diagnosis not present

## 2019-10-31 DIAGNOSIS — E669 Obesity, unspecified: Secondary | ICD-10-CM | POA: Insufficient documentation

## 2019-10-31 DIAGNOSIS — K219 Gastro-esophageal reflux disease without esophagitis: Secondary | ICD-10-CM | POA: Diagnosis not present

## 2019-10-31 DIAGNOSIS — E785 Hyperlipidemia, unspecified: Secondary | ICD-10-CM | POA: Insufficient documentation

## 2019-10-31 LAB — BASIC METABOLIC PANEL
Anion gap: 8 (ref 5–15)
BUN: 13 mg/dL (ref 8–23)
CO2: 26 mmol/L (ref 22–32)
Calcium: 8.8 mg/dL — ABNORMAL LOW (ref 8.9–10.3)
Chloride: 101 mmol/L (ref 98–111)
Creatinine, Ser: 1.05 mg/dL (ref 0.61–1.24)
GFR calc non Af Amer: >60 mL/min (ref >60)
Glucose, Bld: 85 mg/dL (ref 70–99)
Potassium: 4 mmol/L (ref 3.5–5.1)
Sodium: 135 mmol/L (ref 135–145)

## 2019-10-31 LAB — MAGNESIUM: Magnesium: 2.1 mg/dL (ref 1.7–2.4)

## 2019-10-31 NOTE — Addendum Note (Signed)
Encounter addended by: Sherran Needs, NP on: 10/31/2019 2:42 PM  Actions taken: Clinical Note Signed

## 2019-10-31 NOTE — Progress Notes (Addendum)
Date:  10/31/2019   ID:  [name], DOB 03/14/1950, MRN [id number]     PCP:  [name], MD  Primary Cardiologist:  Irish Lack Primary Electrophysiologist:  Allred   CC: persistent  afib   History of Present Illness: Gary [name] is a 69 y.o. male who presents for dofetilide admission today.  Gary [name] had an afib ablation 03/22/18 with protracted pneumonia following the procedure. Because of this, he continued to have issues with afib. Then because of covid restrictions his cardioversion was delayed until 06/27/18. He returned to afib and had appointment with [name] 7/1. It was decided to pursue tikosyn to restore SR. He has tested covid negative.   F/u in afib clinic, 06/14/19. He is in SR with continued use of dofetilide. A few palpitations off and on , none sustained. He had his hip replaced last fall. He has intentionally had weight loss and has given up alcohol he feels well. CHA2DS2VASc score of at least 2, continues on eliquis 5 mg bid. Has had both covid shots.  F/u in the afib clinic, 11/01/19, he has not appreciated any afib. Had a light headed spell out in the  heat yesterday. Checked BP/HR which appeared normal. Ate and drank and felt better in a few minutes. He also had a visit to the ER for severe back pain 9/18. Thought to be a muscle spasm. It has now resolved to the point that he is able to ambulate.Continues  on Tikosyn. Continues on eliquis without any bleeding issues. CHA2DS2VASc score of 2.  Today, he denies symptoms of palpitations, chest pain, shortness of breath, orthopnea, PND, lower extremity edema, claudication, dizziness, presyncope, syncope, bleeding, or neurologic sequela. The patient is tolerating medications without difficulties and is otherwise without complaint today.  he denies symptoms of cough, fevers, chills, or new SOB worrisome for COVID 19.     he has a BMI of There is no height or weight on file to calculate BMI.. There were no  vitals filed for this visit.  Past Medical History:  Diagnosis Date  . Arthritis   . Atrial flutter ([place]) 2007   s/p CTI ablation by Dr Lovena Le  . Back pain    resolved per pt 12/06/18  . Fibromyalgia   . GERD (gastroesophageal reflux disease)   . Hyperlipidemia   . Hypertension   . Insomnia    occasional per pt 12/06/18  . NICM (nonischemic cardiomyopathy) ([place])    a. 2005 - tachy mediated. normalized after return of NSR.  [name] Obesity   . Paroxysmal atrial fibrillation (HCC)   . Pneumonia    x 1  . Spinal stenosis    Past Surgical History:  Procedure Laterality Date  . ATRIAL FIBRILLATION ABLATION N/A 03/22/2018   Procedure: ATRIAL FIBRILLATION ABLATION;  Surgeon: Thompson Grayer, MD;  Location: [place] [facility];  Service: Cardiovascular;  Laterality: N/A;  . atrial flutter ablation  06/10/2005   CTI ablation by Dr Lovena Le for atrial flutter  . CARDIOVERSION N/A 07/20/2016   Procedure: CARDIOVERSION;  Surgeon: Minus Breeding, MD;  Location: [facility] ENDOSCOPY;  Service: Cardiovascular;  Laterality: N/A;  . CARDIOVERSION N/A 12/13/2017   Procedure: CARDIOVERSION;  Surgeon: Thayer Headings, MD;  Location: [facility] ENDOSCOPY;  Service: Cardiovascular;  Laterality: N/A;  . CARDIOVERSION N/A 01/30/2018   Procedure: CARDIOVERSION;  Surgeon: Fay Records, MD;  Location: [facility] ENDOSCOPY;  Service: Cardiovascular;  Laterality: N/A;  . CARDIOVERSION N/A 06/27/2018   Procedure: CARDIOVERSION;  Surgeon: Buford Dresser, MD;  Location: [facility] ENDOSCOPY;  Service: Cardiovascular;  Laterality: N/A;  . CARDIOVERSION N/A 08/16/2018   Procedure: CARDIOVERSION;  Surgeon: [name], MD;  Location: [place];  Service: Cardiovascular;  Laterality: N/A;  . CARDIOVERSION N/A 08/28/2018   Procedure: CARDIOVERSION;  Surgeon: Thayer Headings, MD;  Location: [place];  Service: Cardiovascular;  Laterality: N/A;  . COLONOSCOPY    . EYE SURGERY Bilateral 2019   cataracts removed  . TOTAL HIP ARTHROPLASTY  Left 12/12/2018   Procedure: LEFT TOTAL HIP ARTHROPLASTY ANTERIOR APPROACH;  Surgeon: Mcarthur Rossetti, MD;  Location: [place];  Service: Orthopedics;  Laterality: Left;  . WISDOM TOOTH EXTRACTION       Current Outpatient Medications  Medication Sig Dispense Refill  . acetaminophen (TYLENOL) 500 MG tablet Take 1,000 mg by mouth daily as needed for moderate pain.     [name] amitriptyline (ELAVIL) 50 MG tablet Take 50-75 mg by mouth at bedtime.     [name] atorvastatin (LIPITOR) 10 MG tablet Take 10 mg by mouth every evening.     . calcium carbonate (TUMS - DOSED IN MG ELEMENTAL CALCIUM) 500 MG chewable tablet Chew 2 tablets by mouth daily as needed for indigestion or heartburn.    . cyclobenzaprine (FLEXERIL) 10 MG tablet Take 1 tablet (10 mg total) by mouth 2 (two) times daily as needed for muscle spasms. 20 tablet 0  . diltiazem (CARDIZEM CD) 180 MG 24 hr capsule Take 1 capsule (180 mg total) by mouth every evening. 90 capsule 2  . dofetilide (TIKOSYN) 500 MCG capsule TAKE 1 CAPSULE(500 MCG) BY MOUTH TWICE DAILY. 180 capsule 3  . ELIQUIS 5 MG TABS tablet TAKE 1 TABLET BY MOUTH TWICE DAILY 180 tablet 1  . famotidine (PEPCID) 20 MG tablet Take 20 mg by mouth daily as needed for heartburn or indigestion.    [name] HYDROcodone-acetaminophen (NORCO/VICODIN) 5-325 MG tablet Take 1 tablet by mouth every 6 (six) hours as needed for up to 12 doses for severe pain. 12 tablet 0  . lidocaine (LIDODERM) 5 % Place 1 patch onto the skin daily. Remove & Discard patch within 12 hours or as directed by MD 30 patch 0  . metoprolol succinate (TOPROL-XL) 25 MG 24 hr tablet TAKE 1 TABLET BY MOUTH EVERY MORNING AND 1/2 TABLET EVERY EVENING 135 tablet 2  . Multiple Vitamin (MULTIVITAMIN WITH MINERALS) TABS tablet Take 1 tablet by mouth daily.    . Omega-3 Fatty Acids (FISH OIL) 1200 MG CAPS Take 1,200 mg by mouth 2 (two) times daily.     . sildenafil (VIAGRA) 50 MG tablet Take 50 mg by mouth daily as needed for erectile  dysfunction.    . Simethicone (GAS-X PO) Take 2 tablets by mouth daily as needed (gas).    . valACYclovir (VALTREX) 1000 MG tablet Take 2,000 mg by mouth every 12 (twelve) hours as needed (cold sore).     . vitamin E (VITAMIN E) 400 UNIT capsule Take 400 Units by mouth daily.    [name] zolpidem (AMBIEN) 10 MG tablet Take 10 mg by mouth at bedtime as needed for sleep.     No current facility-administered medications for this encounter.    Allergies:   Penicillins   Social History:  The patient  reports that he quit smoking about 17 years ago. His smoking use included cigarettes. He has a 3.50 pack-year smoking history. He has never used smokeless tobacco. He reports current alcohol use of about 3.0 - 4.0  standard drinks of alcohol per week. He reports that he does not use drugs.   Family History:  The patient's  family history includes Heart attack in his father; Hypertension in his father and mother.    ROS:  Please see the history of present illness.   All other systems are personally reviewed and negative.   Exam: GEN- The patient is well appearing, alert and oriented x 3 today.   Head- normocephalic, atraumatic Eyes-  Sclera clear, conjunctiva pink Ears- hearing intact Oropharynx- clear Neck- supple, no JVP Lymph- no cervical lymphadenopathy Lungs- Clear to ausculation bilaterally, normal work of breathing Heart- regular rate and rhythm, no murmurs, rubs or gallops, PMI not laterally displaced GI- soft, NT, ND, + BS Extremities- no clubbing, cyanosis, or edema MS- no significant deformity or atrophy Skin- no rash or lesion Psych- euthymic mood, full affect Neuro- strength and sensation are intact  Recent Labs: 06/14/2019: Magnesium 2.0 10/13/2019: ALT 29; BUN 15; Creatinine, Ser 0.78; Hemoglobin 16.3; Platelets 190; Potassium 4.6; Sodium 135  personally reviewed    Other studies personally reviewed: Additional studies/ records that were reviewed today include: Epic records EKG-  NSR at 66 bpm, PR int 206 ms, qrs int 88 ms, qtc 417 ms    ASSESSMENT AND PLAN:  1. Persistent  atrial fibrillation S/p ablation afib  02/4578 complicated by protracted pneumonia and now failed DCCV Failed flecainide  H/o atrial flutter ablation as well Now doing well staying in SR on  Tikosyn, loaded 07/2018 Continue eliqius 5 mg bid  This patients CHA2DS2-VASc Score and unadjusted Ischemic Stroke Rate (% per year) is equal to 2.2 % stroke rate/year from a score of 2  Bmet/mag today  He has had covid vaccine and booster   F/u with Dr. Irish Lack as per recall in February when he will due tikosyn surveillance EKG and labs, bmet/mag   I will see 6 months after that visit  for same   Labs/ tests ordered today include: none Orders Placed This Encounter  Procedures  . Basic metabolic panel  . Magnesium  . EKG 12-Lead    Signed, Roderic Palau NP  10/31/2019 2:04 PM  [facility] [address] [place], [place] [address] [phone]

## 2019-12-12 ENCOUNTER — Other Ambulatory Visit: Payer: Self-pay | Admitting: Internal Medicine

## 2019-12-12 DIAGNOSIS — I480 Paroxysmal atrial fibrillation: Secondary | ICD-10-CM

## 2020-01-11 ENCOUNTER — Other Ambulatory Visit: Payer: Self-pay | Admitting: Interventional Cardiology

## 2020-01-11 NOTE — Telephone Encounter (Signed)
Pt last saw Roderic Palau, NP on 10/31/19, last labs 10/31/19 Creat 1.05, age 69, weight 97.6kg, based on specified criteria pt is on appropriate dosage of Eliquis 5mg  BID.  Will refill rx.

## 2020-01-12 IMAGING — RF DG HIP (WITH PELVIS) OPERATIVE*L*
1 series · 3 of 3 positions shown · non-contrast
Comparison: 11/08/2018

CLINICAL DATA: Hip replacement

EXAM:
OPERATIVE left HIP (WITH PELVIS IF PERFORMED) 3 VIEWS
TECHNIQUE: Fluoroscopic spot image(s) were submitted for interpretation
post-operatively.

[Series 1: unknown protocol · 0.20mm/px · 3 of 3 slices shown]
[im 1/3]
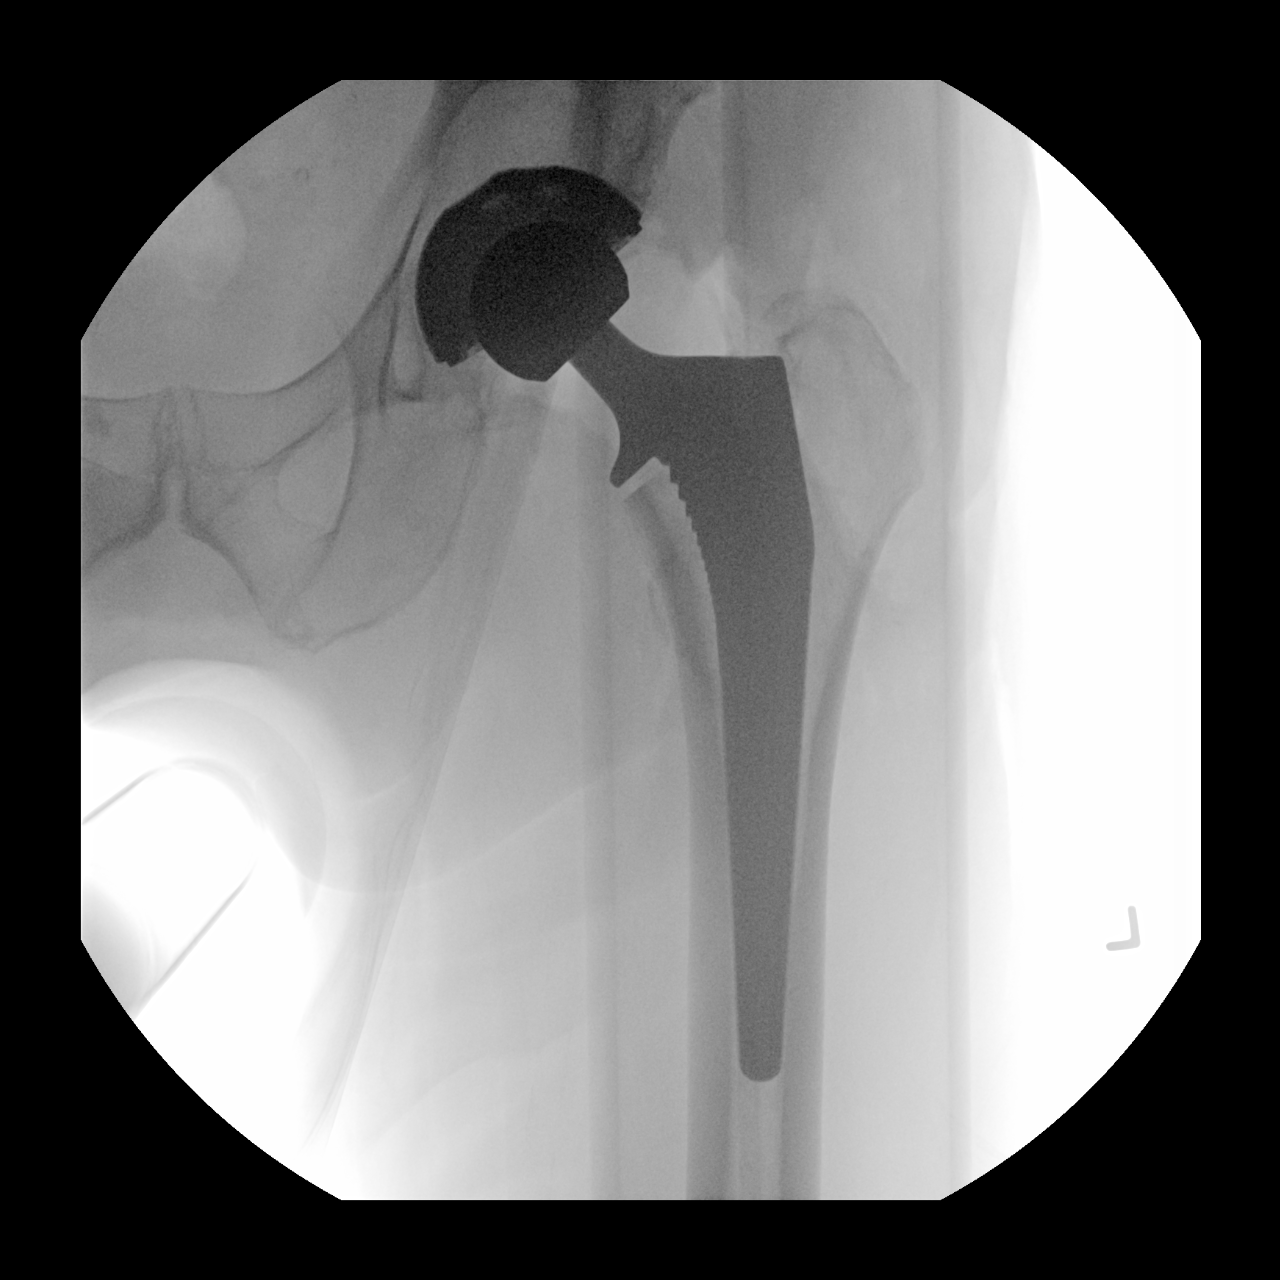
[im 2/3]
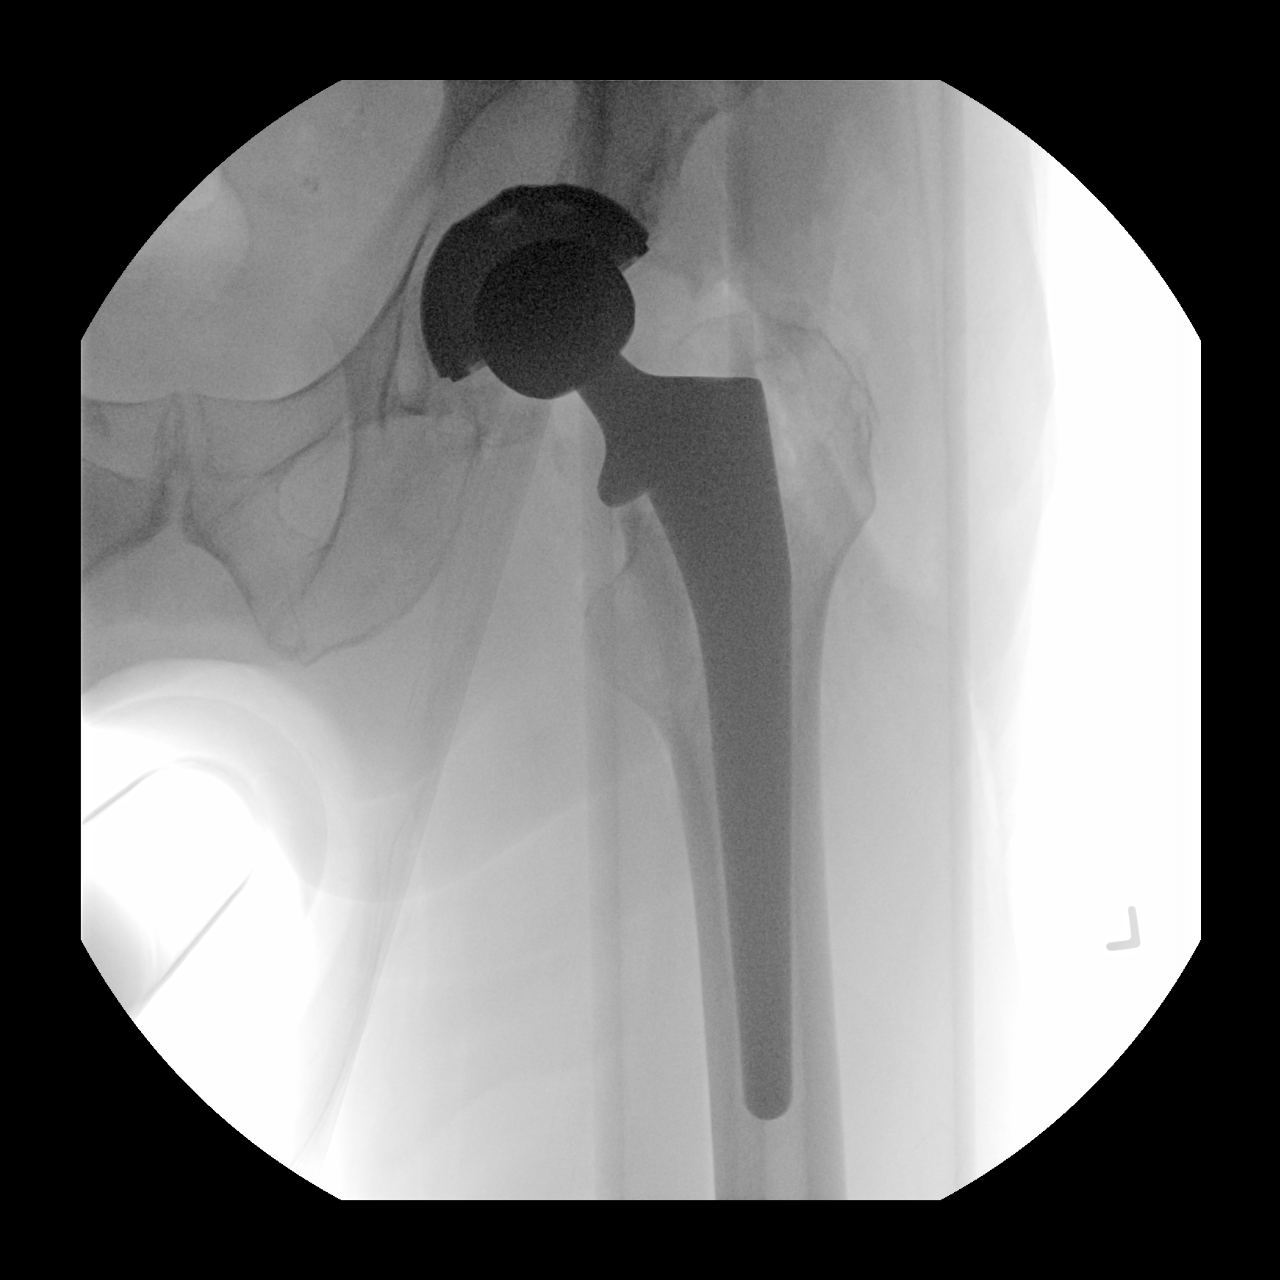
[im 3/3]
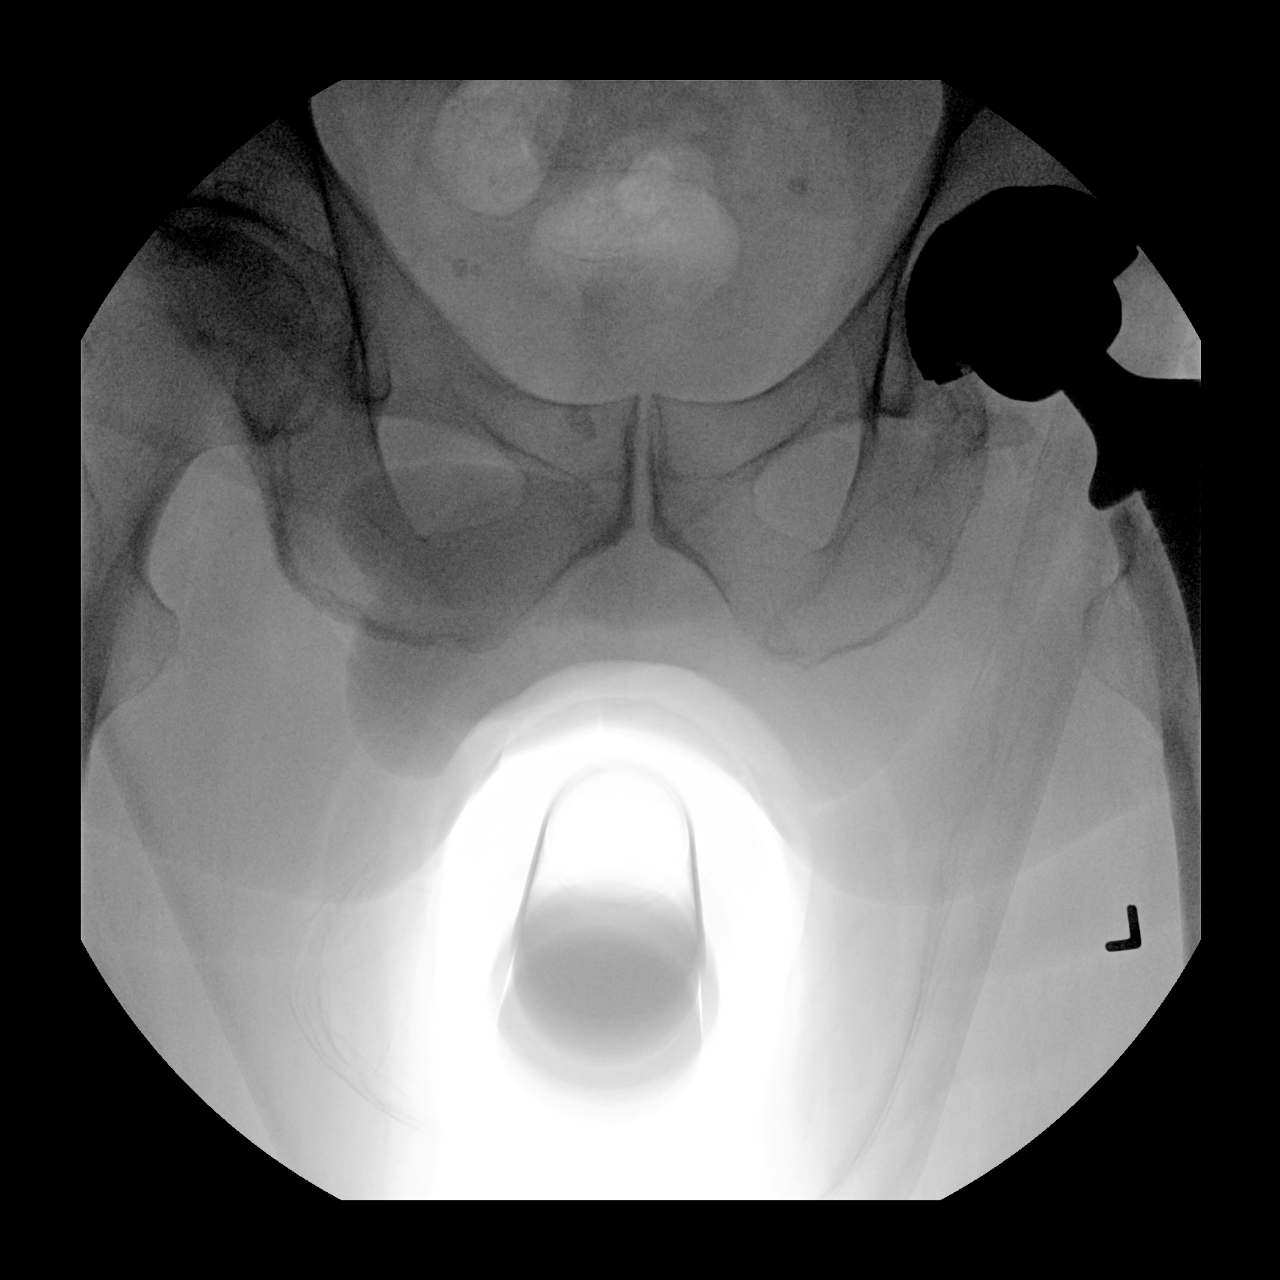

[3 of 3 positions shown; findings below may reference images not displayed]

FINDINGS: Three low resolution intraoperative spot views of the left hip.
Total fluoroscopy time was 26 seconds. The images demonstrate a left
hip replacement with normal alignment
IMPRESSION: Intraoperative fluoroscopic assistance provided during left hip
surgery

## 2020-01-12 IMAGING — RF DG C-ARM 1-60 MIN
1 series · 3 of 3 positions shown · non-contrast
Comparison: 11/08/2018

CLINICAL DATA: Hip replacement

EXAM:
OPERATIVE left HIP (WITH PELVIS IF PERFORMED) 3 VIEWS
TECHNIQUE: Fluoroscopic spot image(s) were submitted for interpretation
post-operatively.

[Series 1: unknown protocol · 0.20mm/px · 3 of 3 slices shown]
[im 1/3]
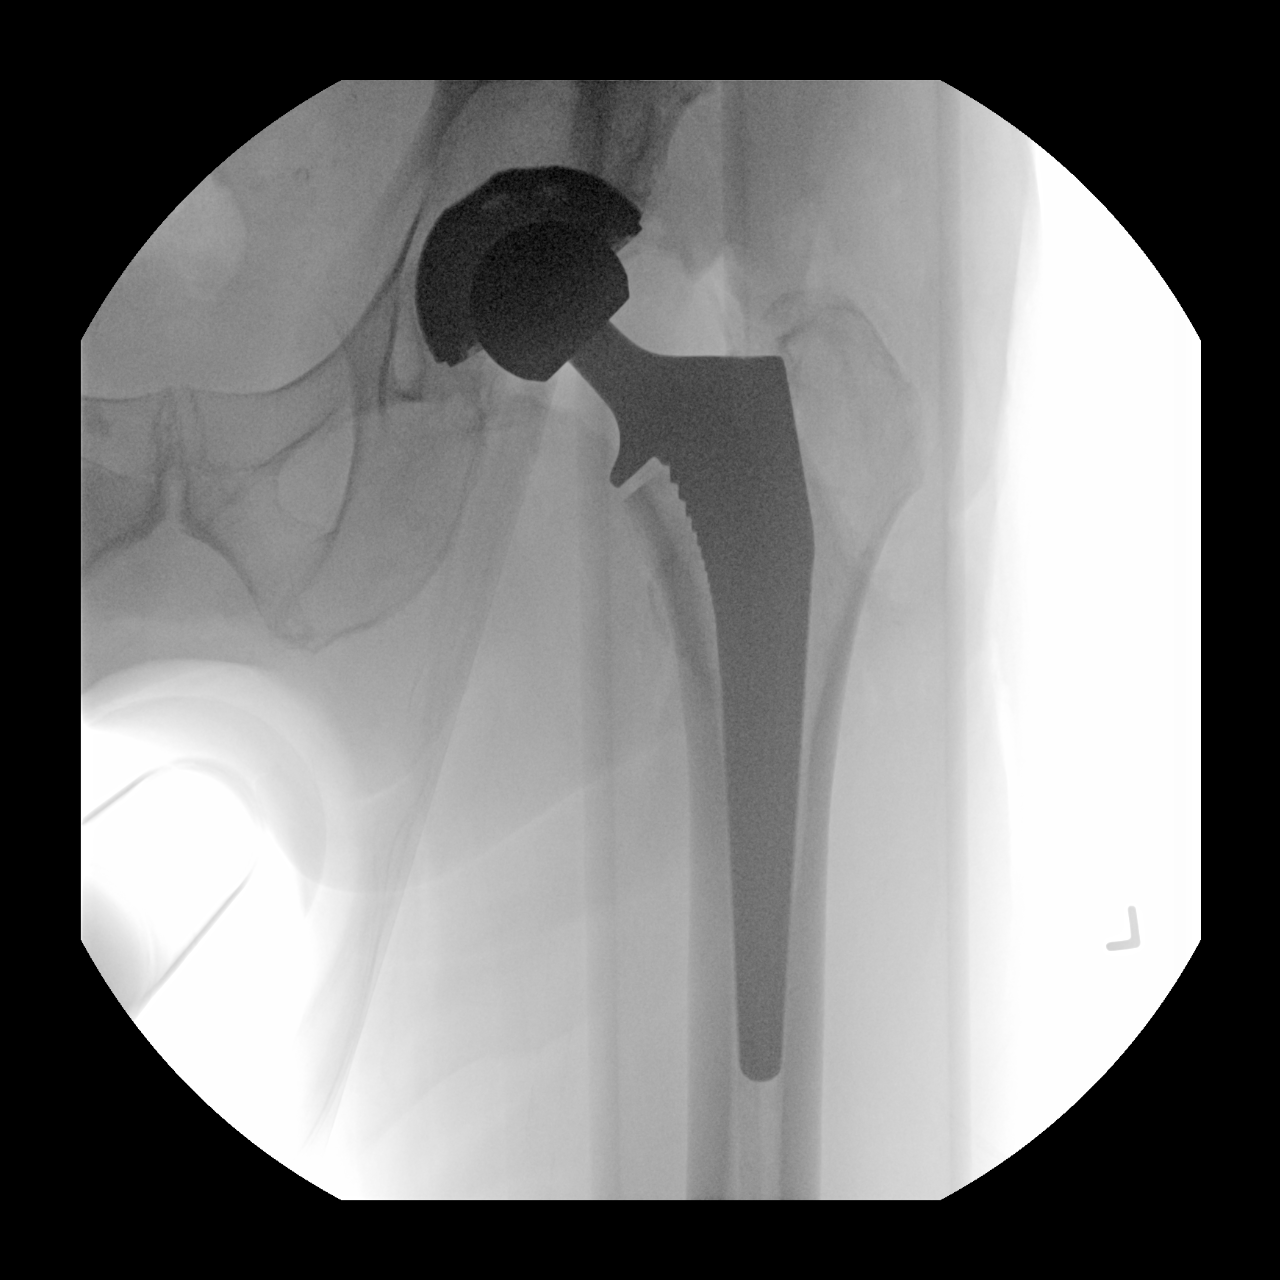
[im 2/3]
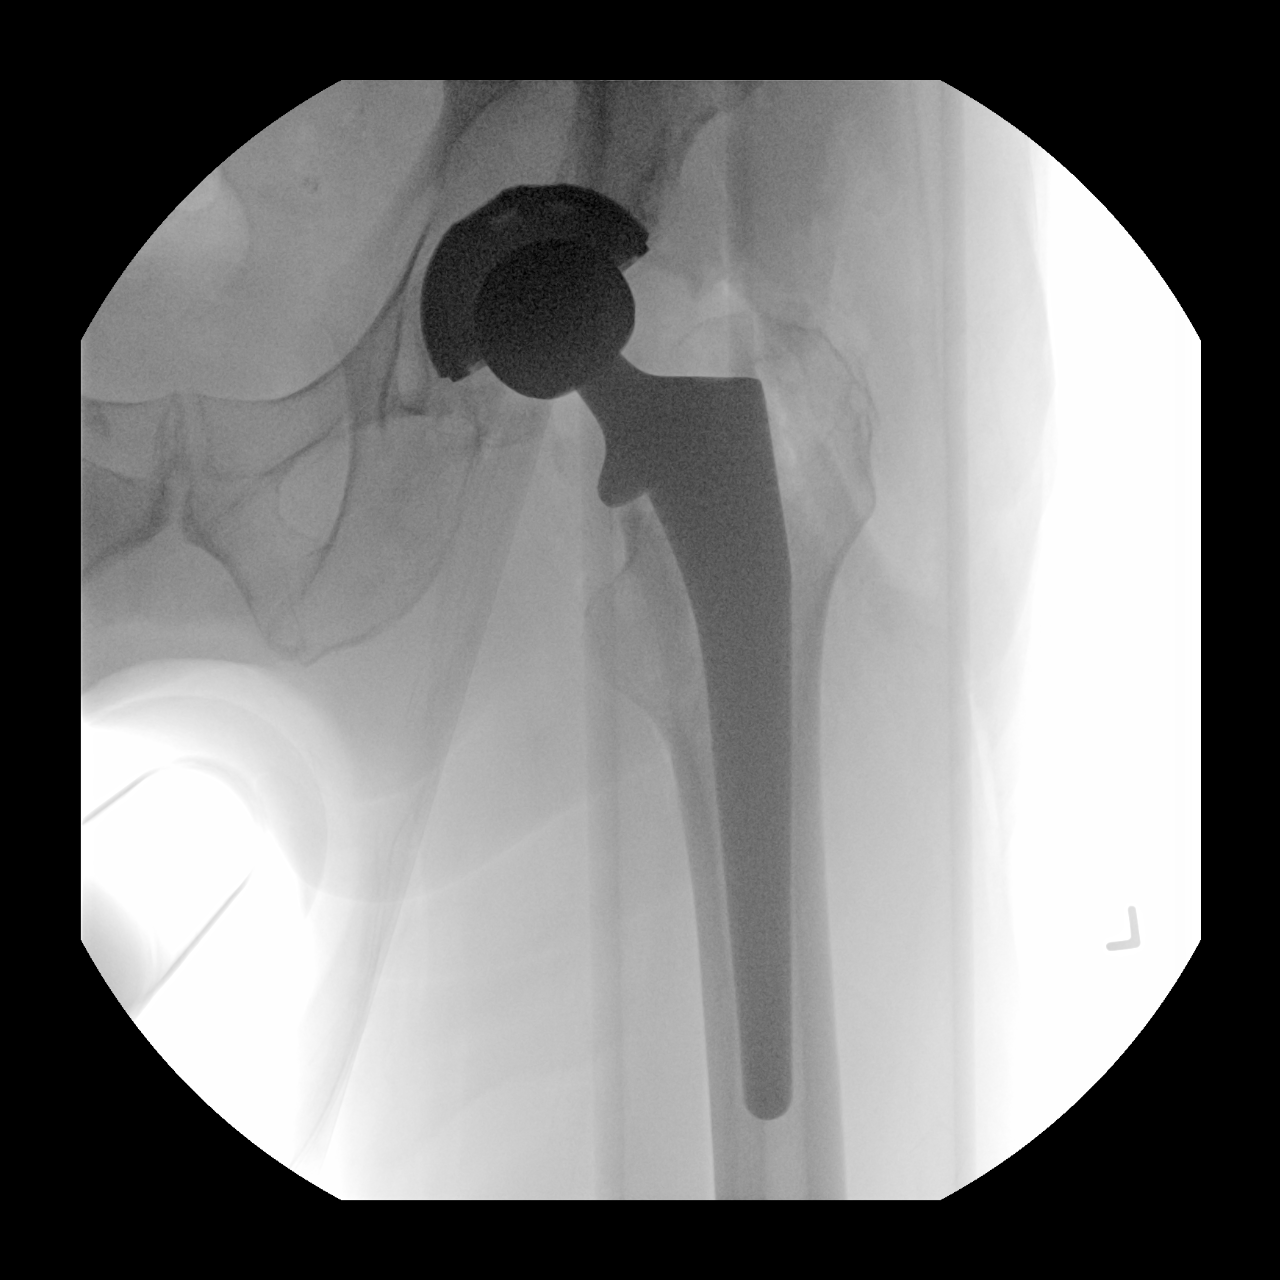
[im 3/3]
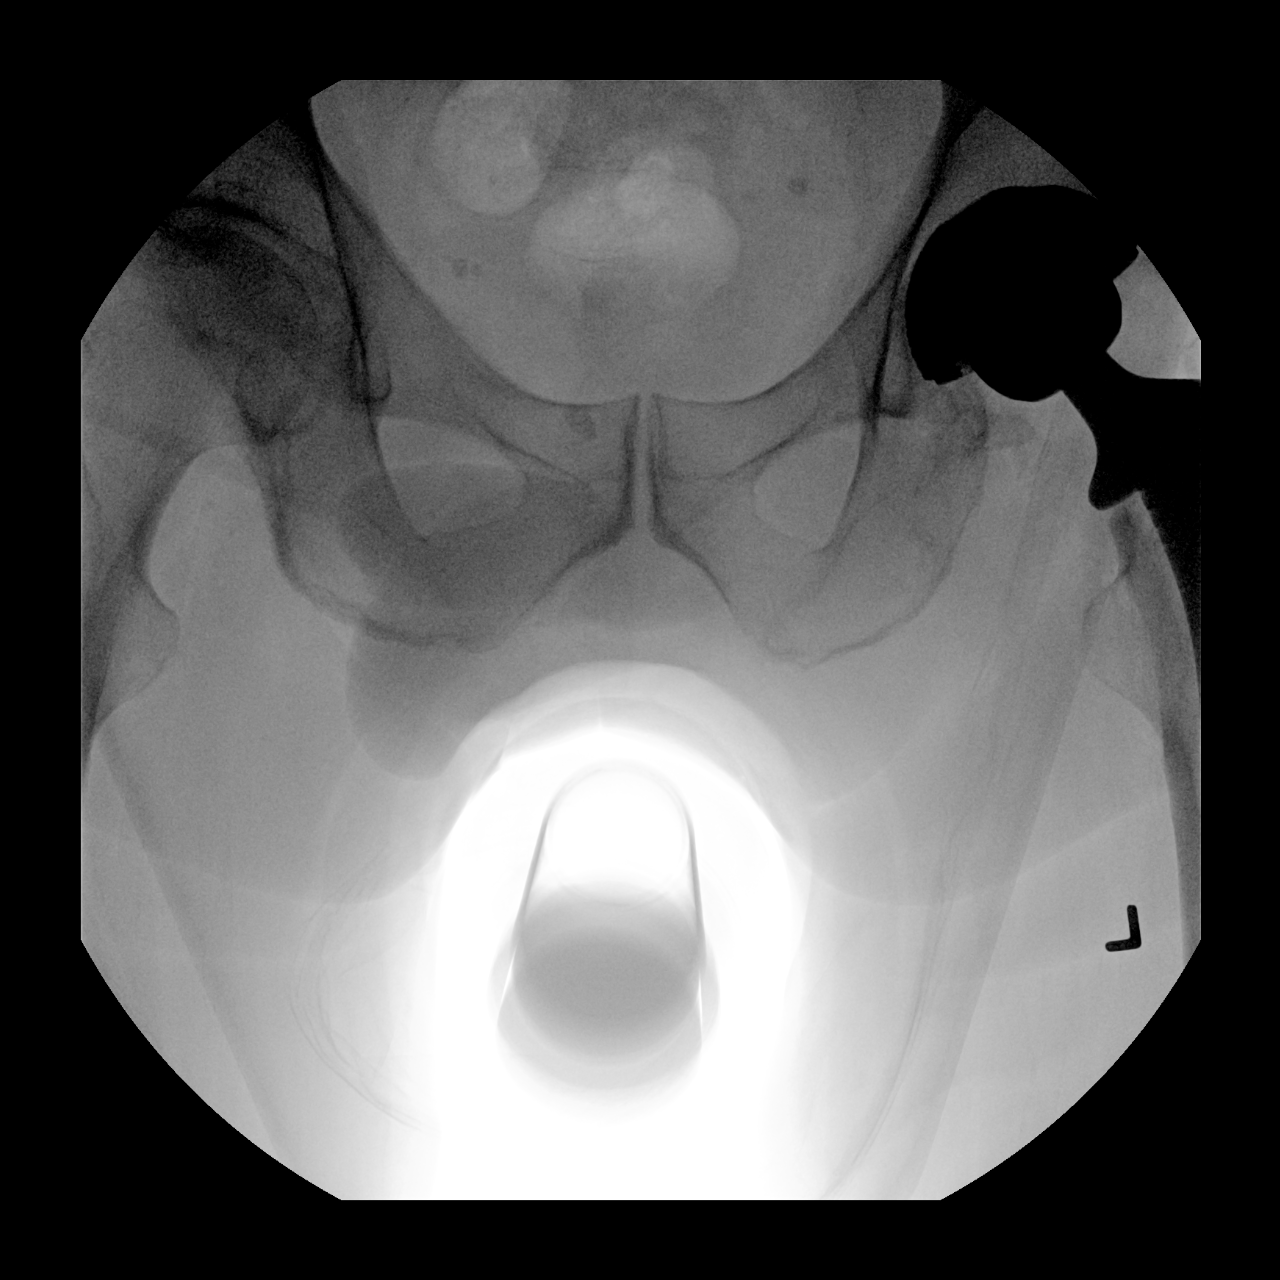

[3 of 3 positions shown; findings below may reference images not displayed]

FINDINGS: Three low resolution intraoperative spot views of the left hip.
Total fluoroscopy time was 26 seconds. The images demonstrate a left
hip replacement with normal alignment
IMPRESSION: Intraoperative fluoroscopic assistance provided during left hip
surgery

## 2020-03-11 ENCOUNTER — Other Ambulatory Visit: Payer: Self-pay | Admitting: Internal Medicine

## 2020-03-11 ENCOUNTER — Other Ambulatory Visit (HOSPITAL_COMMUNITY): Payer: Self-pay | Admitting: *Deleted

## 2020-03-11 DIAGNOSIS — I480 Paroxysmal atrial fibrillation: Secondary | ICD-10-CM

## 2020-03-11 MED ORDER — DILTIAZEM HCL ER COATED BEADS 180 MG PO CP24: 180.0000 mg | ORAL_CAPSULE | Freq: Every evening | ORAL | 2 refills | Status: DC

## 2020-03-11 NOTE — Telephone Encounter (Signed)
This is a A-Fib clinic pt 

## 2020-03-18 DIAGNOSIS — H18513 Endothelial corneal dystrophy, bilateral: Secondary | ICD-10-CM | POA: Diagnosis not present

## 2020-03-18 DIAGNOSIS — H52203 Unspecified astigmatism, bilateral: Secondary | ICD-10-CM | POA: Diagnosis not present

## 2020-03-19 DIAGNOSIS — Z125 Encounter for screening for malignant neoplasm of prostate: Secondary | ICD-10-CM | POA: Diagnosis not present

## 2020-03-19 DIAGNOSIS — M109 Gout, unspecified: Secondary | ICD-10-CM | POA: Diagnosis not present

## 2020-03-19 DIAGNOSIS — I4891 Unspecified atrial fibrillation: Secondary | ICD-10-CM | POA: Diagnosis not present

## 2020-03-19 DIAGNOSIS — E782 Mixed hyperlipidemia: Secondary | ICD-10-CM | POA: Diagnosis not present

## 2020-03-19 DIAGNOSIS — Z0000 Encounter for general adult medical examination without abnormal findings: Secondary | ICD-10-CM | POA: Diagnosis not present

## 2020-03-19 DIAGNOSIS — M4800 Spinal stenosis, site unspecified: Secondary | ICD-10-CM | POA: Diagnosis not present

## 2020-03-19 DIAGNOSIS — I119 Hypertensive heart disease without heart failure: Secondary | ICD-10-CM | POA: Diagnosis not present

## 2020-03-19 DIAGNOSIS — R7301 Impaired fasting glucose: Secondary | ICD-10-CM | POA: Diagnosis not present

## 2020-03-19 DIAGNOSIS — M25511 Pain in right shoulder: Secondary | ICD-10-CM | POA: Diagnosis not present

## 2020-03-19 DIAGNOSIS — G4700 Insomnia, unspecified: Secondary | ICD-10-CM | POA: Diagnosis not present

## 2020-03-27 DIAGNOSIS — G8929 Other chronic pain: Secondary | ICD-10-CM | POA: Diagnosis not present

## 2020-03-27 DIAGNOSIS — M25511 Pain in right shoulder: Secondary | ICD-10-CM | POA: Diagnosis not present

## 2020-03-27 DIAGNOSIS — M25561 Pain in right knee: Secondary | ICD-10-CM | POA: Diagnosis not present

## 2020-04-20 NOTE — Progress Notes (Signed)
Cardiology Office Note   Date:  04/21/2020   ID:  Gary [name], Gary [name] [birth year], MRN [id number]  PCP:  [name], MD    No chief complaint on file.  AFib  Wt Readings from Last 3 Encounters:  04/21/20 217 lb 3.2 oz (98.5 kg)  10/31/19 215 lb 3.2 oz (97.6 kg)  07/04/19 207 lb (93.9 kg)       History of Present Illness: Gary VUOLO is a 70 y.o. male    [place] a h/o afib and atrial flutter. He had a nonischemic (tachycardiac mediated) cardiomyopathy in 2005. He underwent atrial flutter ablation by Dr Lovena Le in 2007. His EF recovered and he did well. He developed atrial fibrillation and required cardioversion. He has been on flecainide for several years and has not had any arrhythmias to his knowledge. About 4 weeks ago, he was moving his cousins and was quite stressed out. He had also consumed more than his usual ETOH (typically 3-4 drinks per night). He developed afib. He reports irregular pulse, fatigue and decreased exercise tolerance. He was evaluated by Dr Irish Lack and placed on eliquis. He was planned for TEE guided cardioversion 06/23/16 however the TEE probe could not be passed despite multiple attempts.  He eventually underwent cardioversion in June 2018. He was seen in the atrial fibrillation clinic and flecainide, diltiazem and Eliquis were continued.   He had some atypical chest pain a few months ago. In January 2019, he had a negative ETT.  In 2019, he had a cough that persisted. He had a CXR and had atelectasis. THis is better. He has had cataract surgery.   He had admission for Tikosyn initiation. He had hip replacement in 2020- but timing of Tikosyn was off.  AFib ablation in 02/2018.  Tikosyn to maintain sinus rhythm.  Followed in atrial fibrillation clinic.  He has had his COVID vaccines.  Denies : Chest pain. Dizziness. Leg edema. Nitroglycerin use. Orthopnea.  Paroxysmal nocturnal dyspnea. Shortness of breath. Syncope.   Rare  palpitations.  AFib sx happen more at night.  Episodes are short.  Back pain limits walking.   He uses a stationary bike, which does not bother his back as much.  The intensity of his exercise has decreased over the years.     Past Medical History:  Diagnosis Date  . Arthritis   . Atrial flutter ([place]) 2007   s/p CTI ablation by Dr Lovena Le  . Back pain    resolved per pt 12/06/18  . Fibromyalgia   . GERD (gastroesophageal reflux disease)   . Hyperlipidemia   . Hypertension   . Insomnia    occasional per pt 12/06/18  . NICM (nonischemic cardiomyopathy) ([place])    a. 2005 - tachy mediated. normalized after return of NSR.  [name] Obesity   . Paroxysmal atrial fibrillation (HCC)   . Pneumonia    x 1  . Spinal stenosis     Past Surgical History:  Procedure Laterality Date  . ATRIAL FIBRILLATION ABLATION N/A 03/22/2018   Procedure: ATRIAL FIBRILLATION ABLATION;  Surgeon: Thompson Grayer, MD;  Location: [place] [facility];  Service: Cardiovascular;  Laterality: N/A;  . atrial flutter ablation  06/10/2005   CTI ablation by Dr Lovena Le for atrial flutter  . CARDIOVERSION N/A 07/20/2016   Procedure: CARDIOVERSION;  Surgeon: Minus Breeding, MD;  Location: [facility] ENDOSCOPY;  Service: Cardiovascular;  Laterality: N/A;  . CARDIOVERSION N/A 12/13/2017   Procedure: CARDIOVERSION;  Surgeon: Thayer Headings, MD;  Location: [facility]  ENDOSCOPY;  Service: Cardiovascular;  Laterality: N/A;  . CARDIOVERSION N/A 01/30/2018   Procedure: CARDIOVERSION;  Surgeon: Fay Records, MD;  Location: [place];  Service: Cardiovascular;  Laterality: N/A;  . CARDIOVERSION N/A 06/27/2018   Procedure: CARDIOVERSION;  Surgeon: Buford Dresser, MD;  Location: [facility] ENDOSCOPY;  Service: Cardiovascular;  Laterality: N/A;  . CARDIOVERSION N/A 08/16/2018   Procedure: CARDIOVERSION;  Surgeon: [name], MD;  Location: [place];  Service: Cardiovascular;  Laterality: N/A;  . CARDIOVERSION N/A 08/28/2018   Procedure:  CARDIOVERSION;  Surgeon: Thayer Headings, MD;  Location: [place];  Service: Cardiovascular;  Laterality: N/A;  . COLONOSCOPY    . EYE SURGERY Bilateral 2019   cataracts removed  . TOTAL HIP ARTHROPLASTY Left 12/12/2018   Procedure: LEFT TOTAL HIP ARTHROPLASTY ANTERIOR APPROACH;  Surgeon: Mcarthur Rossetti, MD;  Location: [place];  Service: Orthopedics;  Laterality: Left;  . WISDOM TOOTH EXTRACTION       Current Outpatient Medications  Medication Sig Dispense Refill  . acetaminophen (TYLENOL) 500 MG tablet Take 1,000 mg by mouth daily as needed for moderate pain.     [name] amitriptyline (ELAVIL) 50 MG tablet Take 25 mg by mouth at bedtime.    [name] atorvastatin (LIPITOR) 10 MG tablet Take 10 mg by mouth every evening.     . calcium carbonate (TUMS - DOSED IN MG ELEMENTAL CALCIUM) 500 MG chewable tablet Chew 2 tablets by mouth daily as needed for indigestion or heartburn.    . cyclobenzaprine (FLEXERIL) 10 MG tablet Take 1 tablet (10 mg total) by mouth 2 (two) times daily as needed for muscle spasms. 20 tablet 0  . diltiazem (CARDIZEM CD) 180 MG 24 hr capsule Take 1 capsule (180 mg total) by mouth every evening. 90 capsule 2  . dofetilide (TIKOSYN) 500 MCG capsule TAKE 1 CAPSULE(500 MCG) BY MOUTH TWICE DAILY 180 capsule 3  . ELIQUIS 5 MG TABS tablet TAKE 1 TABLET BY MOUTH TWICE DAILY 180 tablet 1  . famotidine (PEPCID) 20 MG tablet Take 20 mg by mouth daily as needed for heartburn or indigestion.    . metoprolol succinate (TOPROL-XL) 25 MG 24 hr tablet TAKE 1 TABLET BY MOUTH EVERY MORNING AND 1/2 TABLET EVERY EVENING 135 tablet 2  . Multiple Vitamin (MULTIVITAMIN WITH MINERALS) TABS tablet Take 1 tablet by mouth daily.    . Omega-3 Fatty Acids (FISH OIL) 1200 MG CAPS Take 1,200 mg by mouth 2 (two) times daily.     . sildenafil (VIAGRA) 50 MG tablet Take 50 mg by mouth daily as needed for erectile dysfunction.    . Simethicone (GAS-X PO) Take 2 tablets by mouth daily as needed (gas).    .  valACYclovir (VALTREX) 1000 MG tablet Take 2,000 mg by mouth every 12 (twelve) hours as needed (cold sore).     . vitamin E 180 MG (400 UNITS) capsule Take 400 Units by mouth daily.    [name] zolpidem (AMBIEN) 10 MG tablet Take 5 mg by mouth at bedtime as needed for sleep.     No current facility-administered medications for this visit.    Allergies:   Penicillins    Social History:  The patient  reports that he quit smoking about 18 years ago. His smoking use included cigarettes. He has a 3.50 pack-year smoking history. He has never used smokeless tobacco. He reports current alcohol use of about 3.0 - 4.0 standard drinks of alcohol per week. He reports that he does not use drugs.  Family History:  The patient's family history includes Heart attack in his father; Hypertension in his father and mother.    ROS:  Please see the history of present illness.   Otherwise, review of systems are positive for infrequent palpitations.   All other systems are reviewed and negative.    PHYSICAL EXAM: VS:  BP 118/62   Pulse 66   Ht 6' (1.829 m)   Wt 217 lb 3.2 oz (98.5 kg)   BMI 29.46 kg/m  , BMI Body mass index is 29.46 kg/m. GEN: Well nourished, well developed, in no acute distress  HEENT: normal  Neck: no JVD, carotid bruits, or masses Cardiac: RRR; no murmurs, rubs, or gallops,no edema  Respiratory:  clear to auscultation bilaterally, normal work of breathing GI: soft, nontender, nondistended, + BS MS: no deformity or atrophy  Skin: warm and dry, no rash Neuro:  Strength and sensation are intact Psych: euthymic mood, full affect   EKG:   The ekg ordered today demonstrates NSR, no ST changes, normal QTC   Recent Labs: 10/13/2019: ALT 29; Hemoglobin 16.3; Platelets 190 10/31/2019: BUN 13; Creatinine, Ser 1.05; Magnesium 2.1; Potassium 4.0; Sodium 135   Lipid Panel    Component Value Date/Time   CHOL 176 08/09/2010 0036   TRIG 234 (H) 08/09/2010 0036   HDL 46 08/09/2010 0036    CHOLHDL 3.8 08/09/2010 0036   VLDL 47 (H) 08/09/2010 0036   LDLCALC 83 08/09/2010 0036     Other studies Reviewed: Additional studies/ records that were reviewed today with results demonstrating: labs reviewed.   ASSESSMENT AND PLAN:  1. AFib: s/p AFib ablation.  He has decreased alcohol intake.  Mg was not checked with PMD.  Will recheck electrolytes and Mg today.    2. Hyperlipidemia: The current medical regimen is effective;  continue present plan and medications.  Continue atorvastatin.  LDL 90.  3. Anticoagulated: No bleeding issues.  Stable renal function. Continue Eliquis for stroke prevention.  4. HTN: The current medical regimen is effective;  continue present plan and medications.    Current medicines are reviewed at length with the patient today.  The patient concerns regarding his medicines were addressed.  The following changes have been made:  No change  Labs/ tests ordered today include:  No orders of the defined types were placed in this encounter.   Recommend 150 minutes/week of aerobic exercise Low fat, low carb, high fiber diet recommended  Disposition:   FU in 1 year   Signed, Larae Grooms, MD  04/21/2020 1:33 PM    [facility] [place], [place], [place]  [address] Phone: [phone]; Fax: [phone]

## 2020-04-21 ENCOUNTER — Encounter: Payer: Self-pay | Admitting: Interventional Cardiology

## 2020-04-21 ENCOUNTER — Ambulatory Visit: Payer: PPO | Admitting: Interventional Cardiology

## 2020-04-21 ENCOUNTER — Other Ambulatory Visit: Payer: Self-pay

## 2020-04-21 VITALS — BP 118/62 | HR 66 | Ht 72.0 in | Wt 217.2 lb

## 2020-04-21 DIAGNOSIS — D6869 Other thrombophilia: Secondary | ICD-10-CM | POA: Diagnosis not present

## 2020-04-21 DIAGNOSIS — E782 Mixed hyperlipidemia: Secondary | ICD-10-CM

## 2020-04-21 DIAGNOSIS — I4819 Other persistent atrial fibrillation: Secondary | ICD-10-CM

## 2020-04-21 DIAGNOSIS — I10 Essential (primary) hypertension: Secondary | ICD-10-CM | POA: Diagnosis not present

## 2020-04-21 NOTE — Patient Instructions (Signed)
Medication Instructions:  Your physician recommends that you continue on your current medications as directed. Please refer to the Current Medication list given to you today.  *If you need a refill on your cardiac medications before your next appointment, please call your pharmacy*   Lab Work: Lab work to be done today--BMP and Magnesium If you have labs (blood work) drawn today and your tests are completely normal, you will receive your results only by: [name] MyChart Message (if you have MyChart) OR . A paper copy in the mail If you have any lab test that is abnormal or we need to change your treatment, we will call you to review the results.   Testing/Procedures: none   Follow-Up: At [facility], you and your health needs are our priority.  As part of our continuing mission to provide you with exceptional heart care, we have created designated Provider Care Teams.  These Care Teams include your primary Cardiologist (physician) and Advanced Practice Providers (APPs -  Physician Assistants and Nurse Practitioners) who all work together to provide you with the care you need, when you need it.  We recommend signing up for the patient portal called "MyChart".  Sign up information is provided on this After Visit Summary.  MyChart is used to connect with patients for Virtual Visits (Telemedicine).  Patients are able to view lab/test results, encounter notes, upcoming appointments, etc.  Non-urgent messages can be sent to your provider as well.   To learn more about what you can do with MyChart, go to [email or web address].    Your next appointment:   12 month(s)  The format for your next appointment:   In Person  Provider:   You may see Larae Grooms, MD or one of the following Advanced Practice Providers on your designated Care Team:    Melina Copa, PA-C  Ermalinda Barrios, PA-C    Other Instructions

## 2020-04-22 LAB — BASIC METABOLIC PANEL
BUN/Creatinine Ratio: 15 (ref 10–24)
BUN: 14 mg/dL (ref 8–27)
CO2: 23 mmol/L (ref 20–29)
Calcium: 8 mg/dL — ABNORMAL LOW (ref 8.6–10.2)
Chloride: 99 mmol/L (ref 96–106)
Creatinine, Ser: 0.91 mg/dL (ref 0.76–1.27)
Glucose: 92 mg/dL (ref 65–99)
Potassium: 4.3 mmol/L (ref 3.5–5.2)
Sodium: 136 mmol/L (ref 134–144)
eGFR: 90 mL/min/{1.73_m2} (ref >59)

## 2020-04-22 LAB — MAGNESIUM: Magnesium: 2.3 mg/dL (ref 1.6–2.3)

## 2020-04-25 ENCOUNTER — Ambulatory Visit (HOSPITAL_COMMUNITY)
Admission: RE | Admit: 2020-04-25 | Discharge: 2020-04-25 | Disposition: A | Discharge status: Home / Self Care | Payer: PPO | Source: Ambulatory Visit | Attending: Physician Assistant | Admitting: Physician Assistant

## 2020-04-25 ENCOUNTER — Encounter (HOSPITAL_COMMUNITY): Payer: Self-pay | Admitting: Physician Assistant

## 2020-04-25 ENCOUNTER — Other Ambulatory Visit: Payer: Self-pay

## 2020-04-25 VITALS — BP 110/74 | HR 133 | Ht 72.0 in | Wt 220.0 lb

## 2020-04-25 DIAGNOSIS — E785 Hyperlipidemia, unspecified: Secondary | ICD-10-CM | POA: Diagnosis not present

## 2020-04-25 DIAGNOSIS — I4819 Other persistent atrial fibrillation: Secondary | ICD-10-CM | POA: Diagnosis not present

## 2020-04-25 DIAGNOSIS — D6869 Other thrombophilia: Secondary | ICD-10-CM | POA: Insufficient documentation

## 2020-04-25 DIAGNOSIS — I10 Essential (primary) hypertension: Secondary | ICD-10-CM | POA: Diagnosis not present

## 2020-04-25 DIAGNOSIS — I428 Other cardiomyopathies: Secondary | ICD-10-CM | POA: Insufficient documentation

## 2020-04-25 DIAGNOSIS — I4892 Unspecified atrial flutter: Secondary | ICD-10-CM | POA: Insufficient documentation

## 2020-04-25 DIAGNOSIS — I484 Atypical atrial flutter: Secondary | ICD-10-CM | POA: Diagnosis not present

## 2020-04-25 DIAGNOSIS — Z7901 Long term (current) use of anticoagulants: Secondary | ICD-10-CM | POA: Diagnosis not present

## 2020-04-25 DIAGNOSIS — Z87898 Personal history of other specified conditions: Secondary | ICD-10-CM | POA: Diagnosis not present

## 2020-04-25 LAB — CBC
HCT: 48.3 % (ref 39.0–52.0)
Hemoglobin: 16.5 g/dL (ref 13.0–17.0)
MCH: 32.2 pg (ref 26.0–34.0)
MCHC: 34.2 g/dL (ref 30.0–36.0)
MCV: 94.3 fL (ref 80.0–100.0)
Platelets: 215 10*3/uL (ref 150–400)
RBC: 5.12 MIL/uL (ref 4.22–5.81)
RDW: 13.6 % (ref 11.5–15.5)
WBC: 7.7 10*3/uL (ref 4.0–10.5)
nRBC: 0 % (ref 0.0–0.2)

## 2020-04-25 NOTE — Patient Instructions (Signed)
Cardioversion scheduled for Wednesday, April 13th  - Arrive at the [facility] and go to admitting at 930AM  - Do not eat or drink anything after midnight the night prior to your procedure.  - Take all your morning medication (except diabetic medications) with a sip of water prior to arrival.  - You will not be able to drive home after your procedure.  - Do NOT miss any doses of your blood thinner - if you should miss a dose please notify our office immediately.  - If you feel as if you go back into normal rhythm prior to scheduled cardioversion, please notify our office immediately. If your procedure is canceled in the cardioversion suite you will be charged a cancellation fee.

## 2020-04-25 NOTE — Progress Notes (Signed)
Primary Care Physician: [name], MD Primary Cardiologist: Dr Irish Lack  Primary Electrophysiologist: Dr Rayann Heman Referring Physician: Dr Monica Martinez is a 70 y.o. male with a history of NICM, HTN, HLD, atrial flutter, and atrial fibrillation who presents for follow up in the [facility]. Patient is on Eliquis for a CHADS2VASC score of 2. He has been maintained on dofetilide for his afib and is s/p afib ablation 02/2018. He has had brief (10-15 minute) episodes of afib since ablation but on 04/23/20 he noted a persistently elevated heart rate. He took an extra dose of his BB but this did not slow his heart rate. He has symptoms of dyspnea with exertion. There were no specific triggers that he could identify.   Today, he denies symptoms of palpitations, chest pain, orthopnea, PND, lower extremity edema, dizziness, presyncope, syncope, snoring, daytime somnolence, bleeding, or neurologic sequela. The patient is tolerating medications without difficulties and is otherwise without complaint today.    Atrial Fibrillation Risk Factors:  he does not have symptoms or diagnosis of sleep apnea.. he does not have a history of rheumatic fever.   he has a BMI of Body mass index is 29.84 kg/m.[name] Filed Weights   04/25/20 1333  Weight: 99.8 kg    Family History  Problem Relation Age of Onset  . Heart attack Father   . Hypertension Father   . Hypertension Mother      Atrial Fibrillation Management history:  Previous antiarrhythmic drugs: dofetilide Previous cardioversions: 2018, 2019, 2020 x 4 Previous ablations: 02/2018 CHADS2VASC score: 2 Anticoagulation history: Eliquis   Past Medical History:  Diagnosis Date  . Arthritis   . Atrial flutter ([place]) 2007   s/p CTI ablation by Dr Lovena Le  . Back pain    resolved per pt 12/06/18  . Fibromyalgia   . GERD (gastroesophageal reflux disease)   . Hyperlipidemia   . Hypertension   . Insomnia     occasional per pt 12/06/18  . NICM (nonischemic cardiomyopathy) ([place])    a. 2005 - tachy mediated. normalized after return of NSR.  [name] Obesity   . Paroxysmal atrial fibrillation (HCC)   . Pneumonia    x 1  . Spinal stenosis    Past Surgical History:  Procedure Laterality Date  . ATRIAL FIBRILLATION ABLATION N/A 03/22/2018   Procedure: ATRIAL FIBRILLATION ABLATION;  Surgeon: Thompson Grayer, MD;  Location: [facility];  Service: Cardiovascular;  Laterality: N/A;  . atrial flutter ablation  06/10/2005   CTI ablation by Dr Lovena Le for atrial flutter  . CARDIOVERSION N/A 07/20/2016   Procedure: CARDIOVERSION;  Surgeon: Minus Breeding, MD;  Location: [facility] ENDOSCOPY;  Service: Cardiovascular;  Laterality: N/A;  . CARDIOVERSION N/A 12/13/2017   Procedure: CARDIOVERSION;  Surgeon: Thayer Headings, MD;  Location: [place];  Service: Cardiovascular;  Laterality: N/A;  . CARDIOVERSION N/A 01/30/2018   Procedure: CARDIOVERSION;  Surgeon: Fay Records, MD;  Location: [place];  Service: Cardiovascular;  Laterality: N/A;  . CARDIOVERSION N/A 06/27/2018   Procedure: CARDIOVERSION;  Surgeon: Buford Dresser, MD;  Location: [facility] ENDOSCOPY;  Service: Cardiovascular;  Laterality: N/A;  . CARDIOVERSION N/A 08/16/2018   Procedure: CARDIOVERSION;  Surgeon: [name], MD;  Location: [place];  Service: Cardiovascular;  Laterality: N/A;  . CARDIOVERSION N/A 08/28/2018   Procedure: CARDIOVERSION;  Surgeon: Acie Fredrickson Wonda Cheng, MD;  Location: [place];  Service: Cardiovascular;  Laterality: N/A;  . COLONOSCOPY    . EYE SURGERY  Bilateral 2019   cataracts removed  . TOTAL HIP ARTHROPLASTY Left 12/12/2018   Procedure: LEFT TOTAL HIP ARTHROPLASTY ANTERIOR APPROACH;  Surgeon: Mcarthur Rossetti, MD;  Location: [place];  Service: Orthopedics;  Laterality: Left;  . WISDOM TOOTH EXTRACTION      Current Outpatient Medications  Medication Sig Dispense Refill  . acetaminophen (TYLENOL) 500 MG  tablet Take 1,000 mg by mouth daily as needed for moderate pain.     [name] amitriptyline (ELAVIL) 50 MG tablet Take 25 mg by mouth at bedtime.    [name] atorvastatin (LIPITOR) 10 MG tablet Take 10 mg by mouth every evening.     . calcium carbonate (TUMS - DOSED IN MG ELEMENTAL CALCIUM) 500 MG chewable tablet Chew 2 tablets by mouth daily as needed for indigestion or heartburn.    . cyclobenzaprine (FLEXERIL) 10 MG tablet Take 1 tablet (10 mg total) by mouth 2 (two) times daily as needed for muscle spasms. 20 tablet 0  . diltiazem (CARDIZEM CD) 180 MG 24 hr capsule Take 1 capsule (180 mg total) by mouth every evening. 90 capsule 2  . dofetilide (TIKOSYN) 500 MCG capsule TAKE 1 CAPSULE(500 MCG) BY MOUTH TWICE DAILY 180 capsule 3  . ELIQUIS 5 MG TABS tablet TAKE 1 TABLET BY MOUTH TWICE DAILY 180 tablet 1  . famotidine (PEPCID) 20 MG tablet Take 20 mg by mouth daily as needed for heartburn or indigestion.    . metoprolol succinate (TOPROL-XL) 25 MG 24 hr tablet TAKE 1 TABLET BY MOUTH EVERY MORNING AND 1/2 TABLET EVERY EVENING 135 tablet 2  . Multiple Vitamin (MULTIVITAMIN WITH MINERALS) TABS tablet Take 1 tablet by mouth daily.    . Omega-3 Fatty Acids (FISH OIL) 1200 MG CAPS Take 1,200 mg by mouth 2 (two) times daily.     . sildenafil (VIAGRA) 50 MG tablet Take 50 mg by mouth daily as needed for erectile dysfunction.    . Simethicone (GAS-X PO) Take 2 tablets by mouth daily as needed (gas).    . valACYclovir (VALTREX) 1000 MG tablet Take 2,000 mg by mouth every 12 (twelve) hours as needed (cold sore).     . vitamin E 180 MG (400 UNITS) capsule Take 400 Units by mouth daily.    [name] zolpidem (AMBIEN) 10 MG tablet Take 5 mg by mouth at bedtime as needed for sleep.     No current facility-administered medications for this encounter.    Allergies  Allergen Reactions  . Penicillins Rash    DID THE REACTION INVOLVE: Swelling of the face/tongue/throat, SOB, or low BP? No Sudden or severe rash/hives, skin peeling,  or the inside of the mouth or nose? Unknown Did it require medical treatment? Unknown When did it last happen?Childhood Allergy If all above answers are "NO", may proceed with cephalosporin use.      Social History   Socioeconomic History  . Marital status: Married    Spouse name: Not on file  . Number of children: Not on file  . Years of education: Not on file  . Highest education level: Not on file  Occupational History  . Not on file  Tobacco Use  . Smoking status: Former Smoker    Packs/day: 0.50    Years: 7.00    Pack years: 3.50    Types: Cigarettes    Quit date: 2004    Years since quitting: 18.2  . Smokeless tobacco: Never Used  Vaping Use  . Vaping Use: Never used  Substance and Sexual Activity  .  Alcohol use: Yes    Alcohol/week: 3.0 - 4.0 standard drinks    Types: 3 - 4 Glasses of wine per week    Comment: since 2020 cut back to 1-2 drinks per week  . Drug use: No  . Sexual activity: Yes  Other Topics Concern  . Not on file  Social History Narrative   Lives in [place]   Retired from [employer] Strain: Not on file  Food Insecurity: Not on file  Transportation Needs: Not on file  Physical Activity: Not on file  Stress: Not on file  Social Connections: Not on file  Intimate Partner Violence: Not on file     ROS- All systems are reviewed and negative except as per the HPI above.  Physical Exam: Vitals:   04/25/20 1333  BP: 110/74  Pulse: (!) 133  Weight: 99.8 kg  Height: 6' (1.829 m)    GEN- The patient is a well appearing male, alert and oriented x 3 today.   Head- normocephalic, atraumatic Eyes-  Sclera clear, conjunctiva pink Ears- hearing intact Oropharynx- clear Neck- supple  Lungs- Clear to ausculation bilaterally, normal work of breathing Heart- Regular rate and rhythm, tachycardia, no murmurs, rubs or gallops  GI- soft, NT, ND, + BS Extremities- no clubbing, cyanosis, or  edema MS- no significant deformity or atrophy Skin- no rash or lesion Psych- euthymic mood, full affect Neuro- strength and sensation are intact  Wt Readings from Last 3 Encounters:  04/25/20 99.8 kg  04/21/20 98.5 kg  10/31/19 97.6 kg    EKG today demonstrates  Atypical atrial flutter with 2:1 block Vent. rate 133 BPM PR interval 88 ms QRS duration 84 ms QT/QTcB 296/440 ms  Echo 11/24/17 demonstrated  Left ventricle: The cavity size was at the upper limits of  normal. Systolic function was normal. The estimated ejection  fraction was in the range of 55% to 60%. Wall motion was normal;  there were no regional wall motion abnormalities. The study was  not technically sufficient to allow evaluation of LV diastolic  dysfunction due to atrial fibrillation.  - Aortic valve: Poorly visualized. Trileaflet; normal thickness,  mildly calcified leaflets.  - Mitral valve: Calcified annulus. There was trivial regurgitation.  - Left atrium: The atrium was mildly dilated.  - Tricuspid valve: There was trivial regurgitation.   Epic records are reviewed at length today  CHA2DS2-VASc Score = 2  The patient's score is based upon: CHF History: No (EF recovered) HTN History: Yes Diabetes History: No Stroke History: No Vascular Disease History: No Age Score: 1 Gender Score: 0      ASSESSMENT AND PLAN: 1. Persistent Atrial Fibrillation/atrial flutter The patient's CHA2DS2-VASc score is 2, indicating a 2.2% annual risk of stroke.   S/p afib ablation 02/2018 Patient in rapid atrial flutter today. Will plan for DCCV. Check cbc, recent lab work reviewed.  Continue dofetilide 500 mcg BID. QT stable. Continue Toprol 25 mg AM and 12.5mg  PM Continue diltiazem 180 mg daily Continue Eliquis 5 mg BID, patient denies any missed doses in the last 3 weeks.   2. Secondary Hypercoagulable State (ICD10:  D68.69) The patient is at significant risk for stroke/thromboembolism based upon  his CHA2DS2-VASc Score of 2.  Continue Apixaban (Eliquis).   3. HTN Stable, no changes today.   Follow up in the AF clinic post DCCV.    [facility] [address] [place], [place] [address]  [phone] 04/25/2020 1:45 PM

## 2020-05-05 ENCOUNTER — Other Ambulatory Visit (HOSPITAL_COMMUNITY)
Admission: RE | Admit: 2020-05-05 | Discharge: 2020-05-05 | Disposition: A | Discharge status: Home / Self Care | Payer: PPO | Source: Ambulatory Visit | Attending: Internal Medicine | Admitting: Internal Medicine

## 2020-05-05 DIAGNOSIS — Z01812 Encounter for preprocedural laboratory examination: Secondary | ICD-10-CM | POA: Insufficient documentation

## 2020-05-05 DIAGNOSIS — Z20822 Contact with and (suspected) exposure to covid-19: Secondary | ICD-10-CM | POA: Insufficient documentation

## 2020-05-06 ENCOUNTER — Ambulatory Visit (HOSPITAL_COMMUNITY)
Admission: RE | Admit: 2020-05-06 | Discharge: 2020-05-06 | Disposition: A | Discharge status: Home / Self Care | Payer: PPO | Source: Ambulatory Visit | Attending: Physician Assistant | Admitting: Physician Assistant

## 2020-05-06 ENCOUNTER — Other Ambulatory Visit: Payer: Self-pay

## 2020-05-06 DIAGNOSIS — I4892 Unspecified atrial flutter: Secondary | ICD-10-CM | POA: Diagnosis not present

## 2020-05-06 DIAGNOSIS — I4819 Other persistent atrial fibrillation: Secondary | ICD-10-CM | POA: Diagnosis not present

## 2020-05-06 LAB — SARS CORONAVIRUS 2 (TAT 6-24 HRS): SARS Coronavirus 2: NEGATIVE

## 2020-05-06 NOTE — Progress Notes (Signed)
Patient returns for ECG as he felt he was back in SR. ECG confirms he is back in SR with HR 73, PR 206, QRS 90, QTc 429. Will cancel DCCV. F/u in AF clinic for routine dofetilide monitoring.

## 2020-05-07 ENCOUNTER — Encounter (HOSPITAL_COMMUNITY): Admission: RE | Payer: Self-pay | Source: Home / Self Care

## 2020-05-07 ENCOUNTER — Ambulatory Visit (HOSPITAL_COMMUNITY): Admission: RE | Admit: 2020-05-07 | Payer: PPO | Source: Home / Self Care | Admitting: Internal Medicine

## 2020-05-07 SURGERY — CARDIOVERSION
Anesthesia: General

## 2020-05-15 ENCOUNTER — Ambulatory Visit (HOSPITAL_COMMUNITY): Payer: PPO | Admitting: Nurse Practitioner

## 2020-07-09 ENCOUNTER — Other Ambulatory Visit: Payer: Self-pay | Admitting: Internal Medicine

## 2020-07-09 NOTE — Telephone Encounter (Signed)
Pt last saw Clint Fenton, PA on 04/25/20, last labs 04/21/20 Creat 0.91, age 70, weight 99.8kg, based on specified criteria pt is on appropriate dosage of Eliquis 5mg  BID for aflutter.  Will refill rx.

## 2020-08-20 DIAGNOSIS — L84 Corns and callosities: Secondary | ICD-10-CM | POA: Diagnosis not present

## 2020-08-20 DIAGNOSIS — Z08 Encounter for follow-up examination after completed treatment for malignant neoplasm: Secondary | ICD-10-CM | POA: Diagnosis not present

## 2020-08-20 DIAGNOSIS — Z85828 Personal history of other malignant neoplasm of skin: Secondary | ICD-10-CM | POA: Diagnosis not present

## 2020-08-20 DIAGNOSIS — L570 Actinic keratosis: Secondary | ICD-10-CM | POA: Diagnosis not present

## 2020-08-20 DIAGNOSIS — Z1283 Encounter for screening for malignant neoplasm of skin: Secondary | ICD-10-CM | POA: Diagnosis not present

## 2020-08-20 DIAGNOSIS — X32XXXA Exposure to sunlight, initial encounter: Secondary | ICD-10-CM | POA: Diagnosis not present

## 2020-09-02 ENCOUNTER — Other Ambulatory Visit: Payer: Self-pay

## 2020-09-02 ENCOUNTER — Ambulatory Visit (INDEPENDENT_AMBULATORY_CARE_PROVIDER_SITE_OTHER): Payer: PPO

## 2020-09-02 VITALS — BP 126/68 | HR 56 | Temp 100.2°F | Resp 18

## 2020-09-02 DIAGNOSIS — U071 COVID-19: Secondary | ICD-10-CM | POA: Diagnosis not present

## 2020-09-02 MED ORDER — METHYLPREDNISOLONE SODIUM SUCC 125 MG IJ SOLR: 125.0000 mg | Freq: Once | INTRAMUSCULAR | Status: AC | PRN

## 2020-09-02 MED ORDER — ALBUTEROL SULFATE HFA 108 (90 BASE) MCG/ACT IN AERS: 2.0000 | INHALATION_SPRAY | Freq: Once | RESPIRATORY_TRACT | Status: AC | PRN

## 2020-09-02 MED ORDER — BEBTELOVIMAB 175 MG/2 ML IV (EUA)
175.0000 mg | Freq: Once | INTRAMUSCULAR | Status: AC
  Administered 2020-09-02: 175 mg via INTRAVENOUS

## 2020-09-02 MED ORDER — DIPHENHYDRAMINE HCL 50 MG/ML IJ SOLN: 50.0000 mg | Freq: Once | INTRAMUSCULAR | Status: AC | PRN

## 2020-09-02 MED ORDER — SODIUM CHLORIDE 0.9 % IV SOLN: INTRAVENOUS | Status: AC | PRN

## 2020-09-02 MED ORDER — FAMOTIDINE IN NACL 20-0.9 MG/50ML-% IV SOLN: 20.0000 mg | Freq: Once | INTRAVENOUS | Status: AC | PRN

## 2020-09-02 MED ORDER — EPINEPHRINE 0.3 MG/0.3ML IJ SOAJ: 0.3000 mg | Freq: Once | INTRAMUSCULAR | Status: AC | PRN

## 2020-09-02 NOTE — Progress Notes (Signed)
Diagnosis: COVID  Provider:  Marshell Garfinkel, MD  Procedure: Infusion  IV Type: Peripheral, IV Location: L Forearm  Bebtelovimab, Dose: 175 mg  Infusion Start Time: 1.43pm  09/02/2020  Infusion Stop Time: 1.44pm  09/02/2020  Post Infusion IV Care: Observation period completed and Peripheral IV Discontinued  Discharge: Condition: Good, Destination: Home . AVS provided to patient.   Performed by:  Arnoldo Morale, RN

## 2020-09-02 NOTE — Patient Instructions (Signed)
10 Things You Can Do to Manage Your COVID-19 Symptoms at Home If you have possible or confirmed COVID-19 Stay home except to get medical care. Monitor your symptoms carefully. If your symptoms get worse, call your healthcare provider immediately. Get rest and stay hydrated. If you have a medical appointment, call the healthcare provider ahead of time and tell them that you have or may have COVID-19. For medical emergencies, call 911 and notify the dispatch personnel that you have or may have COVID-19. Cover your cough and sneezes with a tissue or use the inside of your elbow. Wash your hands often with soap and water for at least 20 seconds or clean your hands with an alcohol-based hand sanitizer that contains at least 60% alcohol. As much as possible, stay in a specific room and away from other people in your home. Also, you should use a separate bathroom, if available. If you need to be around other people in or outside of the home, wear a mask. Avoid sharing personal items with other people in your household, like dishes, towels, and bedding. Clean all surfaces that are touched often, like counters, tabletops, and doorknobs. Use household cleaning sprays or wipes according to the label instructions. [email or web address] 08/10/2019 This information is not intended to replace advice given to you by your health care provider. Make sure you discuss any questions you have with your healthcare provider. Document Revised: 02/29/2020 Document Reviewed: 02/29/2020 Elsevier Patient Education  [place].  What types of side effects do monoclonal antibody drugs cause?  Common side effects  In general, the more common side effects caused by monoclonal antibody drugs include: Allergic reactions, such as hives or itching Flu-like signs and symptoms, including chills, fatigue, fever, and muscle aches and pains Nausea, vomiting Diarrhea Skin rashes Low blood pressure   The CDC is recommending  patients who receive monoclonal antibody treatments wait at least 90 days before being vaccinated.  Currently, there are no data on the safety and efficacy of mRNA COVID-19 vaccines in persons who received monoclonal antibodies or convalescent plasma as part of COVID-19 treatment. Based on the estimated half-life of such therapies as well as evidence suggesting that reinfection is uncommon in the 90 days after initial infection, vaccination should be deferred for at least 90 days, as a precautionary measure until additional information becomes available, to avoid interference of the antibody treatment with vaccine-induced immune responses.   Patient was given drug information sheet for Bebtelovimab.  Also given cost estimate sheet for Bebtelovimab.  Patient reviewed documentation and questions were answered.  Patient would like to proceed with treatment at this time.

## 2020-09-11 DIAGNOSIS — Z20822 Contact with and (suspected) exposure to covid-19: Secondary | ICD-10-CM | POA: Diagnosis not present

## 2020-09-12 ENCOUNTER — Telehealth (HOSPITAL_COMMUNITY): Payer: Self-pay

## 2020-09-12 NOTE — Telephone Encounter (Signed)
Patient called and states he was recently diagnosed with Covid. He wanted to know what he could take for the nasal congestion and cough. Per Roderic Palau NP-patient advised it is okay to take antihistamines such as Claritin or Zyrtec. Regarding the cough he can try plain Robitussin or Mucinex. Instructed patient not to take meds with DM in them. Consulted with patient and he verbalized understanding

## 2020-09-18 DIAGNOSIS — E782 Mixed hyperlipidemia: Secondary | ICD-10-CM | POA: Diagnosis not present

## 2020-09-18 DIAGNOSIS — I119 Hypertensive heart disease without heart failure: Secondary | ICD-10-CM | POA: Diagnosis not present

## 2020-09-18 DIAGNOSIS — I480 Paroxysmal atrial fibrillation: Secondary | ICD-10-CM | POA: Diagnosis not present

## 2020-09-18 DIAGNOSIS — G4700 Insomnia, unspecified: Secondary | ICD-10-CM | POA: Diagnosis not present

## 2020-09-18 DIAGNOSIS — R7301 Impaired fasting glucose: Secondary | ICD-10-CM | POA: Diagnosis not present

## 2020-09-19 DIAGNOSIS — Z20822 Contact with and (suspected) exposure to covid-19: Secondary | ICD-10-CM | POA: Diagnosis not present

## 2020-09-22 ENCOUNTER — Other Ambulatory Visit: Payer: Self-pay | Admitting: Internal Medicine

## 2020-09-22 DIAGNOSIS — I480 Paroxysmal atrial fibrillation: Secondary | ICD-10-CM

## 2020-11-06 ENCOUNTER — Ambulatory Visit (HOSPITAL_COMMUNITY): Payer: PPO | Admitting: Nurse Practitioner

## 2020-11-19 ENCOUNTER — Other Ambulatory Visit: Payer: Self-pay

## 2020-11-19 ENCOUNTER — Ambulatory Visit (HOSPITAL_COMMUNITY)
Admission: RE | Admit: 2020-11-19 | Discharge: 2020-11-19 | Disposition: A | Discharge status: Home / Self Care | Payer: PPO | Source: Ambulatory Visit | Attending: Nurse Practitioner | Admitting: Nurse Practitioner

## 2020-11-19 ENCOUNTER — Encounter (HOSPITAL_COMMUNITY): Payer: Self-pay | Admitting: Nurse Practitioner

## 2020-11-19 VITALS — BP 126/60 | HR 45 | Ht 72.0 in | Wt 211.0 lb

## 2020-11-19 DIAGNOSIS — Z8249 Family history of ischemic heart disease and other diseases of the circulatory system: Secondary | ICD-10-CM | POA: Diagnosis not present

## 2020-11-19 DIAGNOSIS — D6869 Other thrombophilia: Secondary | ICD-10-CM

## 2020-11-19 DIAGNOSIS — Z87891 Personal history of nicotine dependence: Secondary | ICD-10-CM | POA: Insufficient documentation

## 2020-11-19 DIAGNOSIS — Z7901 Long term (current) use of anticoagulants: Secondary | ICD-10-CM | POA: Diagnosis not present

## 2020-11-19 DIAGNOSIS — I4819 Other persistent atrial fibrillation: Secondary | ICD-10-CM | POA: Insufficient documentation

## 2020-11-19 DIAGNOSIS — Z79899 Other long term (current) drug therapy: Secondary | ICD-10-CM | POA: Insufficient documentation

## 2020-11-19 DIAGNOSIS — Z09 Encounter for follow-up examination after completed treatment for conditions other than malignant neoplasm: Secondary | ICD-10-CM | POA: Insufficient documentation

## 2020-11-19 LAB — BASIC METABOLIC PANEL
Anion gap: 5 (ref 5–15)
BUN: 14 mg/dL (ref 8–23)
CO2: 28 mmol/L (ref 22–32)
Calcium: 8.9 mg/dL (ref 8.9–10.3)
Chloride: 103 mmol/L (ref 98–111)
Creatinine, Ser: 0.96 mg/dL (ref 0.61–1.24)
GFR, Estimated: >60 mL/min (ref >60)
Glucose, Bld: 94 mg/dL (ref 70–99)
Potassium: 4.5 mmol/L (ref 3.5–5.1)
Sodium: 136 mmol/L (ref 135–145)

## 2020-11-19 LAB — MAGNESIUM: Magnesium: 2.2 mg/dL (ref 1.7–2.4)

## 2020-11-19 MED ORDER — DOFETILIDE 500 MCG PO CAPS: 500.0000 ug | ORAL_CAPSULE | Freq: Two times a day (BID) | ORAL | Status: DC

## 2020-11-19 NOTE — Progress Notes (Signed)
Date:  11/19/2020   ID:  Gary [name], DOB [birth year], MRN [id number]     PCP:  [name], MD  Primary Cardiologist:  Irish Lack Primary Electrophysiologist:  Allred   CC: persistent  afib   History of Present Illness: Gary [name] is a 70 y.o. male who presents for dofetilide admission today.  Mr. Gary [name] had an afib ablation 03/22/18 with protracted pneumonia following the procedure. Because of this, he continued to have issues with afib. Then because of covid restrictions his cardioversion was delayed until 06/27/18. He returned to afib and had appointment with Dr. Rayann [name] 7/1. It was decided to pursue tikosyn to restore SR. He has tested covid negative.   F/u in afib clinic, 06/14/19. He is in SR with continued use of dofetilide. A few palpitations off and on , none sustained. He had his hip replaced last fall. He has intentionally had weight loss and has given up alcohol he feels well. CHA2DS2VASc score of at least 2, continues on eliquis 5 mg bid. Has had both covid shots.  F/u in the afib clinic, 11/01/19, he has not appreciated any afib. Had a light headed spell out in the  heat yesterday. Checked BP/HR which appeared normal. Ate and drank and felt better in a few minutes. He also had a visit to the ER for severe back pain 9/18. Thought to be a muscle spasm. It has now resolved to the point that he is able to ambulate.Continues  on Tikosyn. Continues on eliquis without any bleeding issues. CHA2DS2VASc score of 2.  F/u in afib clinic, 11/19/20. Pt is here for  f/u Tikosyn surveillance.  He reports minimal afib. He is having fragmented sleep. He goes to sleep easily but then will will wake up after several hours unable to get back to sleep. His heart rate is 46 bpm today but is not symptomatic with this. He has noted heart rates in the 50's/low 60's at home. We discussed that melatonin  for sleep can be used safely with Tikosyn.   Today, he denies symptoms of palpitations, chest  pain, shortness of breath, orthopnea, PND, lower extremity edema, claudication, dizziness, presyncope, syncope, bleeding, or neurologic sequela. The patient is tolerating medications without difficulties and is otherwise without complaint today.  he denies symptoms of cough, fevers, chills, or new SOB worrisome for COVID 19.     he has a BMI of There is no height or weight on file to calculate BMI.. There were no vitals filed for this visit.  Past Medical History:  Diagnosis Date   Arthritis    Atrial flutter ([place]) 2007   s/p CTI ablation by Dr Lovena Le   Back pain    resolved per pt 12/06/18   Fibromyalgia    GERD (gastroesophageal reflux disease)    Hyperlipidemia    Hypertension    Insomnia    occasional per pt 12/06/18   NICM (nonischemic cardiomyopathy) ([place])    a. 2005 - tachy mediated. normalized after return of NSR.   Obesity    Paroxysmal atrial fibrillation (HCC)    Pneumonia    x 1   Spinal stenosis    Past Surgical History:  Procedure Laterality Date   ATRIAL FIBRILLATION ABLATION N/A 03/22/2018   Procedure: ATRIAL FIBRILLATION ABLATION;  Surgeon: Gary Grayer, MD;  Location: [place] [facility];  Service: Cardiovascular;  Laterality: N/A;   atrial flutter ablation  06/10/2005   CTI ablation by Dr Lovena Le for atrial flutter  CARDIOVERSION N/A 07/20/2016   Procedure: CARDIOVERSION;  Surgeon: Gary Breeding, MD;  Location: [place];  Service: Cardiovascular;  Laterality: N/A;   CARDIOVERSION N/A 12/13/2017   Procedure: CARDIOVERSION;  Surgeon: Gary Fredrickson Wonda Cheng, MD;  Location: [place];  Service: Cardiovascular;  Laterality: N/A;   CARDIOVERSION N/A 01/30/2018   Procedure: CARDIOVERSION;  Surgeon: Gary Records, MD;  Location: [place];  Service: Cardiovascular;  Laterality: N/A;   CARDIOVERSION N/A 06/27/2018   Procedure: CARDIOVERSION;  Surgeon: Gary Dresser, MD;  Location: [facility] ENDOSCOPY;  Service: Cardiovascular;  Laterality: N/A;   CARDIOVERSION  N/A 08/16/2018   Procedure: CARDIOVERSION;  Surgeon: Gary Casino, MD;  Location: [place];  Service: Cardiovascular;  Laterality: N/A;   CARDIOVERSION N/A 08/28/2018   Procedure: CARDIOVERSION;  Surgeon: Gary Fredrickson Wonda Cheng, MD;  Location: [place];  Service: Cardiovascular;  Laterality: N/A;   COLONOSCOPY     EYE SURGERY Bilateral 2019   cataracts removed   TOTAL HIP ARTHROPLASTY Left 12/12/2018   Procedure: LEFT TOTAL HIP ARTHROPLASTY ANTERIOR APPROACH;  Surgeon: Gary Rossetti, MD;  Location: [place];  Service: Orthopedics;  Laterality: Left;   WISDOM TOOTH EXTRACTION       Current Outpatient Medications  Medication Sig Dispense Refill   acetaminophen (TYLENOL) 500 MG tablet Take 1,000 mg by mouth every 6 (six) hours as needed (pain).     amitriptyline (ELAVIL) 25 MG tablet Take 12.5 mg by mouth at bedtime.     apixaban (ELIQUIS) 5 MG TABS tablet TAKE ONE TABLET BY MOUTH TWICE DAILY 60 tablet 6   atorvastatin (LIPITOR) 10 MG tablet Take 10 mg by mouth every evening.      calcium carbonate (TUMS - DOSED IN MG ELEMENTAL CALCIUM) 500 MG chewable tablet Chew 1-2 tablets by mouth 2 (two) times daily as needed for indigestion or heartburn.     cyclobenzaprine (FLEXERIL) 10 MG tablet Take 1 tablet (10 mg total) by mouth 2 (two) times daily as needed for muscle spasms. 20 tablet 0   diltiazem (CARDIZEM CD) 180 MG 24 hr capsule Take 1 capsule (180 mg total) by mouth every evening. 90 capsule 2   dofetilide (TIKOSYN) 500 MCG capsule TAKE 1 CAPSULE(500 MCG) BY MOUTH TWICE DAILY (Patient taking differently: Take 500 mcg by mouth 2 (two) times daily.) 180 capsule 3   famotidine (PEPCID) 20 MG tablet Take 20 mg by mouth daily as needed for heartburn or indigestion.     metoprolol succinate (TOPROL-XL) 25 MG 24 hr tablet Take ONE tablet EVERY MORNING AND 1/2 tablet EVERY EVENING 135 tablet 0   Multiple Vitamin (MULTIVITAMIN WITH MINERALS) TABS tablet Take 1 tablet by mouth in the morning.      Omega-3 Fatty Acids (FISH OIL) 1200 MG CAPS Take 1,200 mg by mouth 2 (two) times daily.      sildenafil (VIAGRA) 50 MG tablet Take 25 mg by mouth daily as needed for erectile dysfunction.     simethicone (MYLICON) 80 MG chewable tablet Chew 80-160 mg by mouth every 6 (six) hours as needed for flatulence.     valACYclovir (VALTREX) 1000 MG tablet Take 2,000 mg by mouth every 12 (twelve) hours as needed (cold sore).      vitamin E 180 MG (400 UNITS) capsule Take 400 Units by mouth in the morning.     zolpidem (AMBIEN) 10 MG tablet Take 5 mg by mouth at bedtime as needed for sleep.     Current Facility-Administered Medications  Medication Dose Route Frequency Provider Last  Rate Last Admin   0.9 %  sodium chloride infusion   Intravenous PRN [name], MD        Allergies:   Penicillins   Social History:  The patient  reports that he quit smoking about 18 years ago. His smoking use included cigarettes. He has a 3.50 pack-year smoking history. He has never used smokeless tobacco. He reports current alcohol use of about 3.0 - 4.0 standard drinks per week. He reports that he does not use drugs.   Family History:  The patient's  family history includes Heart attack in his father; Hypertension in his father and mother.    ROS:  Please see the history of present illness.   All other systems are personally reviewed and negative.   Exam: GEN- The patient is well appearing, alert and oriented x 3 today.   Head- normocephalic, atraumatic Eyes-  Sclera clear, conjunctiva pink Ears- hearing intact Oropharynx- clear Neck- supple, no JVP Lymph- no cervical lymphadenopathy Lungs- Clear to ausculation bilaterally, normal work of breathing Heart- regular rate and rhythm, no murmurs, rubs or gallops, PMI not laterally displaced GI- soft, NT, ND, + BS Extremities- no clubbing, cyanosis, or edema MS- no significant deformity or atrophy Skin- no rash or lesion Psych- euthymic mood, full  affect Neuro- strength and sensation are intact  Recent Labs: 04/21/2020: BUN 14; Creatinine, Ser 0.91; Magnesium 2.3; Potassium 4.3; Sodium 136 04/25/2020: Hemoglobin 16.5; Platelets 215  personally reviewed    Other studies personally reviewed: Additional studies/ [name] that were reviewed today include: Epic [name]  EKG- NSR at 45 bpm, PR int 212 ms, qrs int 88 ms, qtc 385 ms    ASSESSMENT AND PLAN:  1. Persistent  atrial fibrillation S/p afib ablation 03/2438 complicated by protracted pneumonia and failed  subsequent DCCV Failed flecainide  H/o atrial flutter ablation as well Now doing well staying in SR on  Tikosyn, loaded 07/2018 Continue eliqius 5 mg bid  This patients CHA2DS2-VASc Score and unadjusted Ischemic Stroke Rate (% per year) is equal to 2.2 % stroke rate/year from a score of 2  Bmet/mag today   I will back for surveillance of Tikosyn in 6 months    Labs/ tests ordered today include: none No orders of the defined types were placed in this encounter.   Signed, Roderic Palau NP  11/19/2020 1:20 PM  Af[facility] [address] [place], [place] [address] [phone]

## 2020-11-21 ENCOUNTER — Telehealth (HOSPITAL_COMMUNITY): Payer: Self-pay | Admitting: *Deleted

## 2020-11-21 DIAGNOSIS — I480 Paroxysmal atrial fibrillation: Secondary | ICD-10-CM

## 2020-11-21 MED ORDER — METOPROLOL SUCCINATE ER 25 MG PO TB24: 12.5000 mg | ORAL_TABLET | Freq: Every day | ORAL | 0 refills | Status: DC

## 2020-11-21 NOTE — Telephone Encounter (Signed)
Patient called in stating he was instructed to decrease metoprolol to 25mg  once a day due to HRs in the 40s at recent office visit. Since decreasing metoprolol to 25mg  a day he didn't notice any improvement with HRs - yesterday and today he has only taken 12.5mg  once a day and his HR this morning is 52. Pt BP 139/69. Pt will continue monitoring for 1 week then call with update of HR/BP. Pt in agreement.

## 2020-12-27 ENCOUNTER — Other Ambulatory Visit (HOSPITAL_COMMUNITY): Payer: Self-pay | Admitting: Nurse Practitioner

## 2020-12-27 DIAGNOSIS — I480 Paroxysmal atrial fibrillation: Secondary | ICD-10-CM

## 2021-01-12 DIAGNOSIS — R0683 Snoring: Secondary | ICD-10-CM | POA: Diagnosis not present

## 2021-01-12 DIAGNOSIS — G4700 Insomnia, unspecified: Secondary | ICD-10-CM | POA: Diagnosis not present

## 2021-01-12 DIAGNOSIS — D751 Secondary polycythemia: Secondary | ICD-10-CM | POA: Diagnosis not present

## 2021-01-12 DIAGNOSIS — M6080 Other myositis, unspecified site: Secondary | ICD-10-CM | POA: Diagnosis not present

## 2021-01-12 DIAGNOSIS — M549 Dorsalgia, unspecified: Secondary | ICD-10-CM | POA: Diagnosis not present

## 2021-01-12 DIAGNOSIS — I119 Hypertensive heart disease without heart failure: Secondary | ICD-10-CM | POA: Diagnosis not present

## 2021-01-16 ENCOUNTER — Telehealth: Payer: Self-pay | Admitting: Interventional Cardiology

## 2021-01-16 NOTE — Telephone Encounter (Signed)
°  Per scheduling request message:   I have reduced the 25 mg dosage of Metoprolol from 1 tablet in the morning and 1/2 tablet in the evening to 1/2 tablet in the morning only. The afib clinic recommended this due to a heart rate of 46 at an October visit and continued home measurements in the 50s. This is lower than I have ever measured. Also, some concern it may have been contributing to my insomnia. A sleep physician has recommended I stop taking 10 mg of Atorvastatin to see if this will help my insomnia. I am very concerned about my heart health. Questions about the correct medications and dosage, changes in heart rate, fluctuations in heart rate, an increase in blood pressure , more brief periods of afib, all leading to an increase in stress. Please schedule an appointment for me as soon as possible. I prefer to see Dr Irish Lack but if another physician is appropriate and available sooner that is an option. Also, if I received a letter from Dr Rayann Heman that he is leaving the practice. He has helped me with afib in the past and question if I need to establish a relationship with his replacement. I have so many questions/concerns.

## 2021-01-16 NOTE — Telephone Encounter (Signed)
Spoke with the pt and he reprts that he has been having a fluctuating HR for several weeks.. no palpitations and no SOB or dizziness... he says he has been making some med changes on his own and at the advise of his PCP and just does not feel like he is in a "stable" place with his med management and just feels he would like to be seen to talk with Dr. Irish Lack about his heart. He denies chest pain... he just feels he is not managing his meds well. He has not been sleeping well and just anxious about his heart "health".   I spent several minutes on the phone with the pt and unable to really triage what his complaints are and felt best he comes in to be seen as per his request to talk with the MD.    I made him an appt with Dr. Irish Lack 01/29/21.. he will monitor his HR and BP and write down for his visit... I advised to let us know if he has any further questions or problems prior to his OV to please call us.

## 2021-01-29 ENCOUNTER — Other Ambulatory Visit: Payer: Self-pay

## 2021-01-29 ENCOUNTER — Ambulatory Visit: Payer: PPO | Admitting: Interventional Cardiology

## 2021-01-29 ENCOUNTER — Encounter: Payer: Self-pay | Admitting: Interventional Cardiology

## 2021-01-29 VITALS — BP 116/62 | HR 62 | Ht 72.0 in | Wt 207.0 lb

## 2021-01-29 DIAGNOSIS — I480 Paroxysmal atrial fibrillation: Secondary | ICD-10-CM

## 2021-01-29 DIAGNOSIS — I10 Essential (primary) hypertension: Secondary | ICD-10-CM

## 2021-01-29 DIAGNOSIS — E782 Mixed hyperlipidemia: Secondary | ICD-10-CM | POA: Diagnosis not present

## 2021-01-29 DIAGNOSIS — Z7901 Long term (current) use of anticoagulants: Secondary | ICD-10-CM

## 2021-01-29 MED ORDER — ROSUVASTATIN CALCIUM 10 MG PO TABS: 10.0000 mg | ORAL_TABLET | Freq: Every day | ORAL | 3 refills | Status: DC

## 2021-01-29 NOTE — Progress Notes (Signed)
Cardiology Office Note   Date:  01/29/2021   ID:  Gary [name], DOB 09-Dec-1950, MRN [id number]  PCP:  [name], MD    Chief Complaint  Patient presents with   Follow-up    Unable to sleep, wants to discuss lipitor, low heart rate   AFib  Wt Readings from Last 3 Encounters:  01/29/21 207 lb (93.9 kg)  11/19/20 211 lb (95.7 kg)  04/25/20 220 lb (99.8 kg)       History of Present Illness: Gary [name] is a 71 y.o. male   Who  has a h/o afib and atrial flutter.  He had a nonischemic (tachycardiac mediated) cardiomyopathy in 2005.  He underwent atrial flutter ablation by Dr Lovena Le in 2007.  His EF recovered and he did well.  He developed atrial fibrillation and required cardioversion.  He has been on flecainide for several years and has not had any arrhythmias to his knowledge.  About 4 weeks ago, he was moving his cousins and was quite stressed out.  He had also consumed more than his usual ETOH  (typically 3-4 drinks per night).  He developed afib.  He reports irregular pulse, fatigue and decreased exercise tolerance.  He was evaluated by Dr Irish Lack and placed on eliquis.  He was planned for TEE guided cardioversion 06/23/16 however the TEE probe could not be passed despite multiple attempts.   He eventually underwent cardioversion in June 2018.  He was seen in the atrial fibrillation clinic and flecainide, diltiazem and Eliquis were continued.    He had some atypical chest pain a few months ago. In January 2019, he had a negative ETT.   In 2019, he had a cough that persisted.  He had a CXR and had atelectasis.  THis is better.  He has had cataract surgery.    He had admission for Tikosyn initiation. He had hip replacement in 2020- but timing of Tikosyn was off.   AFib ablation in 02/2018.  Tikosyn to maintain sinus rhythm.  Followed in atrial fibrillation clinic.   He has had his COVID vaccines. In 03/2020: "He uses a stationary bike, which does not bother his back as  much.  The intensity of his exercise has decreased over the years."  HR was 46 in late 2022 at AFib clinic.  Metoprolol was decreased.  Sleep quality has decreased as well.  He tried [place] without success.  Had metoprolol decreased more.  Went to sleep clinic. He was told to stop atorvastatin for sleep, but there was no change.   BP in the 130s/70s range.  HR in the 50s typically   Past Medical History:  Diagnosis Date   Arthritis    Atrial flutter ([place]) 2007   s/p CTI ablation by Dr Lovena Le   Back pain    resolved per pt 12/06/18   Fibromyalgia    GERD (gastroesophageal reflux disease)    Hyperlipidemia    Hypertension    Insomnia    occasional per pt 12/06/18   NICM (nonischemic cardiomyopathy) ([place])    a. 2005 - tachy mediated. normalized after return of NSR.   Obesity    Paroxysmal atrial fibrillation ([facility])    Pneumonia    x 1   Spinal stenosis     Past Surgical History:  Procedure Laterality Date   ATRIAL FIBRILLATION ABLATION N/A 03/22/2018   Procedure: ATRIAL FIBRILLATION ABLATION;  Surgeon: Thompson Grayer, MD;  Location: [facility];  Service: Cardiovascular;  Laterality: N/A;  atrial flutter ablation  06/10/2005   CTI ablation by Dr Lovena Le for atrial flutter   CARDIOVERSION N/A 07/20/2016   Procedure: CARDIOVERSION;  Surgeon: Minus Breeding, MD;  Location: [facility];  Service: Cardiovascular;  Laterality: N/A;   CARDIOVERSION N/A 12/13/2017   Procedure: CARDIOVERSION;  Surgeon: Thayer Headings, MD;  Location: [facility];  Service: Cardiovascular;  Laterality: N/A;   CARDIOVERSION N/A 01/30/2018   Procedure: CARDIOVERSION;  Surgeon: Fay Records, MD;  Location: [facility];  Service: Cardiovascular;  Laterality: N/A;   CARDIOVERSION N/A 06/27/2018   Procedure: CARDIOVERSION;  Surgeon: Buford Dresser, MD;  Location: [facility];  Service: Cardiovascular;  Laterality: N/A;   CARDIOVERSION N/A 08/16/2018   Procedure: CARDIOVERSION;  Surgeon: [name], MD;  Location: [facility];  Service: Cardiovascular;  Laterality: N/A;   CARDIOVERSION N/A 08/28/2018   Procedure: CARDIOVERSION;  Surgeon: Acie Fredrickson Wonda Cheng, MD;  Location: [facility]  Service: Cardiovascular;  Laterality: N/A;   COLONOSCOPY     EYE SURGERY Bilateral 2019   cataracts removed   TOTAL HIP ARTHROPLASTY Left 12/12/2018   Procedure: LEFT TOTAL HIP ARTHROPLASTY ANTERIOR APPROACH;  Surgeon: Mcarthur Rossetti, MD;  Location: [facility];  Service: Orthopedics;  Laterality: Left;   WISDOM TOOTH EXTRACTION       Current Outpatient Medications  Medication Sig Dispense Refill   acetaminophen (TYLENOL) 500 MG tablet Take 1,000 mg by mouth every 6 (six) hours as needed (pain).     apixaban (ELIQUIS) 5 MG TABS tablet TAKE ONE TABLET BY MOUTH TWICE DAILY 60 tablet 6   atorvastatin (LIPITOR) 10 MG tablet Take 10 mg by mouth every evening.      calcium carbonate (TUMS - DOSED IN MG ELEMENTAL CALCIUM) 500 MG chewable tablet Chew 1-2 tablets by mouth 2 (two) times daily as needed for indigestion or heartburn.     cyclobenzaprine (FLEXERIL) 10 MG tablet Take 1 tablet (10 mg total) by mouth 2 (two) times daily as needed for muscle spasms. 20 tablet 0   diltiazem (CARDIZEM CD) 180 MG 24 hr capsule TAKE ONE CAPSULE BY MOUTH IN THE EVENING 90 capsule 1   dofetilide (TIKOSYN) 500 MCG capsule Take 1 capsule (500 mcg total) by mouth 2 (two) times daily.     famotidine (PEPCID) 20 MG tablet Take 20 mg by mouth daily as needed for heartburn or indigestion.     melatonin 5 MG TABS Take 5 mg by mouth at bedtime.     metoprolol succinate (TOPROL-XL) 25 MG 24 hr tablet Take 0.5 tablets (12.5 mg total) by mouth daily. 135 tablet 0   sildenafil (VIAGRA) 50 MG tablet Take 25 mg by mouth daily as needed for erectile dysfunction.     simethicone (MYLICON) 80 MG chewable tablet Chew 80-160 mg by mouth every 6 (six) hours as needed for flatulence.     valACYclovir (VALTREX) 1000 MG tablet Take 2,000  mg by mouth every 12 (twelve) hours as needed (cold sore).      zolpidem (AMBIEN) 10 MG tablet Take 5 mg by mouth at bedtime as needed for sleep.     Current Facility-Administered Medications  Medication Dose Route Frequency Provider Last Rate Last Admin   0.9 %  sodium chloride infusion   Intravenous PRN [name], MD        Allergies:   Penicillins    Social History:  The patient  reports that he quit smoking about 19 years ago. His smoking use included cigarettes. He has a 3.50  pack-year smoking history. He has never used smokeless tobacco. He reports current alcohol use of about 3.0 - 4.0 standard drinks per week. He reports that he does not use drugs.   Family History:  The patient's family history includes Heart attack in his father; Hypertension in his father and mother.    ROS:  Please see the history of present illness.   Otherwise, review of systems are positive for stress; .   All other systems are reviewed and negative.    PHYSICAL EXAM: VS:  BP 116/62    Pulse 62    Ht 6' (1.829 m)    Wt 207 lb (93.9 kg)    SpO2 98%    BMI 28.07 kg/m  , BMI Body mass index is 28.07 kg/m. GEN: Well nourished, well developed, in no acute distress HEENT: normal Neck: no JVD, carotid bruits, or masses Cardiac: RRR; no murmurs, rubs, or gallops,no edema  Respiratory:  clear to auscultation bilaterally, normal work of breathing GI: soft, nontender, nondistended, + BS MS: no deformity or atrophy Skin: warm and dry, no rash Neuro:  Strength and sensation are intact Psych: euthymic mood, full affect   EKG:   The ekg ordered today demonstrates sinus bradycardia, no ST changes   Recent Labs: 04/25/2020: Hemoglobin 16.5; Platelets 215 11/19/2020: BUN 14; Creatinine, Ser 0.96; Magnesium 2.2; Potassium 4.5; Sodium 136   Lipid Panel    Component Value Date/Time   CHOL 176 08/09/2010 0036   TRIG 234 (H) 08/09/2010 0036   HDL 46 08/09/2010 0036   CHOLHDL 3.8 08/09/2010 0036   VLDL 47  (H) 08/09/2010 0036   LDLCALC 83 08/09/2010 0036     Other studies Reviewed: Additional studies/ records that were reviewed today with results demonstrating: LDL 69 in 08/2020- while on atorvastatin.  ECG with narrow QRS.  Upper normal PR interval.  Normal QT interval.   ASSESSMENT AND PLAN:  Atrial fibrillation: Status post A. fib ablation.  He has worked on decreasing alcohol intake over the years.  He has been on Tikosyn to maintain normal sinus rhythm.  He has noted some bradycardia.  He has seen heart rates down into the 40s at times.  No symptoms of any lightheadedness.  He has not had syncope.  His heart rate seems to increase appropriately with activity.  His metoprolol has already been decreased.  Will defer to A. fib clinic but I think either his metoprolol could be stopped or diltiazem decreased if he had symptoms with his bradycardia.  We will not make any changes now since he is not symptomatic. Hyperlipidemia: Whole food, plant-based diet recommended.  Avoid processed foods.  Continue lipid-lowering therapy with rosuvastatin rather than atorvastatin.  He was told to stop atorvastatin by sleep clinic, although this has not changed his insomnia..  Anticoagulated: Monitor hemoglobin and electrolytes.  Labs to be checked in March.  Will await liver and lipid tests at that time as well after initiation of Crestor. Hypertension: Low-salt diet.  Avoid processed foods.  The current medical regimen is effective;  continue present plan and medications. We spoke a lot about his insomnia and other potential issues that would require over-the-counter medications.  I explained why Ambien and Benadryl have some downside to them.  He has tried melatonin without success.  He could consider valerian root.  We spoke about avoiding screen time and alcohol as well as caffeine prior to bedtime.  He should try to keep his sleep routine the same.  His blood pressure  is well controlled but I recommended that he  can try Coricidin if he gets cold.  Current medicines are reviewed at length with the patient today.  The patient concerns regarding his medicines were addressed.  The following changes have been made:    Labs/ tests ordered today include:  No orders of the defined types were placed in this encounter.   Recommend 150 minutes/week of aerobic exercise Low fat, low carb, high fiber diet recommended  Disposition:   FU in 1 year   Signed, Larae Grooms, MD  01/29/2021 4:16 PM    [facility] [place], [place], [place]  [address] Phone: [phone]; Fax: [phone]

## 2021-01-29 NOTE — Patient Instructions (Signed)
Medication Instructions:  Your physician has recommended you make the following change in your medication: Stop Atorvastatin. Start Rosuvastatin 10 mg by mouth daily  *If you need a refill on your cardiac medications before your next appointment, please call your pharmacy*   Lab Work: Lab work to be checked by primary care provider at March visit If you have labs (blood work) drawn today and your tests are completely normal, you will receive your results only by: [employer] (if you have MyChart) OR A paper copy in the mail If you have any lab test that is abnormal or we need to change your treatment, we will call you to review the results.   Testing/Procedures: none   Follow-Up: At [facility], you and your health needs are our priority.  As part of our continuing mission to provide you with exceptional heart care, we have created designated Provider Care Teams.  These Care Teams include your primary Cardiologist (physician) and Advanced Practice Providers (APPs -  Physician Assistants and Nurse Practitioners) who all work together to provide you with the care you need, when you need it.  We recommend signing up for the patient portal called "MyChart".  Sign up information is provided on this After Visit Summary.  MyChart is used to connect with patients for Virtual Visits (Telemedicine).  Patients are able to view lab/test results, encounter notes, upcoming appointments, etc.  Non-urgent messages can be sent to your provider as well.   To learn more about what you can do with MyChart, go to [email or web address].    Your next appointment:   12 month(s)  The format for your next appointment:   In Person  Provider:   Larae Grooms, MD     Other Instructions

## 2021-02-07 ENCOUNTER — Other Ambulatory Visit: Payer: Self-pay | Admitting: Internal Medicine

## 2021-02-09 DIAGNOSIS — D225 Melanocytic nevi of trunk: Secondary | ICD-10-CM | POA: Diagnosis not present

## 2021-02-09 DIAGNOSIS — L820 Inflamed seborrheic keratosis: Secondary | ICD-10-CM | POA: Diagnosis not present

## 2021-02-09 DIAGNOSIS — L821 Other seborrheic keratosis: Secondary | ICD-10-CM | POA: Diagnosis not present

## 2021-02-09 NOTE — Telephone Encounter (Signed)
Prescription refill request for Eliquis received. Indication: Afib  Last office visit: 01/29/21 Irish Lack)  Scr: 0.96 (11/19/20)  Age: 71 Weight: 93.9kg  Appropriate dose and refill sent to requested pharmacy.

## 2021-02-24 ENCOUNTER — Other Ambulatory Visit: Payer: Self-pay | Admitting: Interventional Cardiology

## 2021-02-24 DIAGNOSIS — I480 Paroxysmal atrial fibrillation: Secondary | ICD-10-CM

## 2021-02-24 NOTE — Telephone Encounter (Signed)
This is a A-Fib clinic pt 

## 2021-02-25 DIAGNOSIS — R0683 Snoring: Secondary | ICD-10-CM | POA: Diagnosis not present

## 2021-02-25 DIAGNOSIS — D751 Secondary polycythemia: Secondary | ICD-10-CM | POA: Diagnosis not present

## 2021-02-25 DIAGNOSIS — G4700 Insomnia, unspecified: Secondary | ICD-10-CM | POA: Diagnosis not present

## 2021-02-25 DIAGNOSIS — I480 Paroxysmal atrial fibrillation: Secondary | ICD-10-CM | POA: Diagnosis not present

## 2021-03-04 DIAGNOSIS — M1612 Unilateral primary osteoarthritis, left hip: Secondary | ICD-10-CM | POA: Diagnosis not present

## 2021-03-04 DIAGNOSIS — M4800 Spinal stenosis, site unspecified: Secondary | ICD-10-CM | POA: Diagnosis not present

## 2021-03-05 DIAGNOSIS — M4316 Spondylolisthesis, lumbar region: Secondary | ICD-10-CM | POA: Diagnosis not present

## 2021-03-05 DIAGNOSIS — M4125 Other idiopathic scoliosis, thoracolumbar region: Secondary | ICD-10-CM | POA: Diagnosis not present

## 2021-03-05 DIAGNOSIS — M5440 Lumbago with sciatica, unspecified side: Secondary | ICD-10-CM | POA: Diagnosis not present

## 2021-03-10 DIAGNOSIS — M47816 Spondylosis without myelopathy or radiculopathy, lumbar region: Secondary | ICD-10-CM | POA: Diagnosis not present

## 2021-03-10 DIAGNOSIS — M4800 Spinal stenosis, site unspecified: Secondary | ICD-10-CM | POA: Diagnosis not present

## 2021-03-10 DIAGNOSIS — M5440 Lumbago with sciatica, unspecified side: Secondary | ICD-10-CM | POA: Diagnosis not present

## 2021-03-10 DIAGNOSIS — M1612 Unilateral primary osteoarthritis, left hip: Secondary | ICD-10-CM | POA: Diagnosis not present

## 2021-03-10 DIAGNOSIS — M5136 Other intervertebral disc degeneration, lumbar region: Secondary | ICD-10-CM | POA: Diagnosis not present

## 2021-03-17 DIAGNOSIS — M4800 Spinal stenosis, site unspecified: Secondary | ICD-10-CM | POA: Diagnosis not present

## 2021-03-17 DIAGNOSIS — M5440 Lumbago with sciatica, unspecified side: Secondary | ICD-10-CM | POA: Diagnosis not present

## 2021-03-17 DIAGNOSIS — M1612 Unilateral primary osteoarthritis, left hip: Secondary | ICD-10-CM | POA: Diagnosis not present

## 2021-03-17 DIAGNOSIS — Z6827 Body mass index (BMI) 27.0-27.9, adult: Secondary | ICD-10-CM | POA: Diagnosis not present

## 2021-03-18 DIAGNOSIS — G4733 Obstructive sleep apnea (adult) (pediatric): Secondary | ICD-10-CM | POA: Diagnosis not present

## 2021-03-18 DIAGNOSIS — G4700 Insomnia, unspecified: Secondary | ICD-10-CM | POA: Diagnosis not present

## 2021-03-19 DIAGNOSIS — Z961 Presence of intraocular lens: Secondary | ICD-10-CM | POA: Diagnosis not present

## 2021-03-19 DIAGNOSIS — H18513 Endothelial corneal dystrophy, bilateral: Secondary | ICD-10-CM | POA: Diagnosis not present

## 2021-03-19 DIAGNOSIS — H5202 Hypermetropia, left eye: Secondary | ICD-10-CM | POA: Diagnosis not present

## 2021-03-26 DIAGNOSIS — Z0000 Encounter for general adult medical examination without abnormal findings: Secondary | ICD-10-CM | POA: Diagnosis not present

## 2021-03-26 DIAGNOSIS — E782 Mixed hyperlipidemia: Secondary | ICD-10-CM | POA: Diagnosis not present

## 2021-03-26 DIAGNOSIS — I700 Atherosclerosis of aorta: Secondary | ICD-10-CM | POA: Diagnosis not present

## 2021-03-26 DIAGNOSIS — I119 Hypertensive heart disease without heart failure: Secondary | ICD-10-CM | POA: Diagnosis not present

## 2021-03-26 DIAGNOSIS — M4800 Spinal stenosis, site unspecified: Secondary | ICD-10-CM | POA: Diagnosis not present

## 2021-03-26 DIAGNOSIS — R7301 Impaired fasting glucose: Secondary | ICD-10-CM | POA: Diagnosis not present

## 2021-03-26 DIAGNOSIS — G4700 Insomnia, unspecified: Secondary | ICD-10-CM | POA: Diagnosis not present

## 2021-03-26 DIAGNOSIS — Z125 Encounter for screening for malignant neoplasm of prostate: Secondary | ICD-10-CM | POA: Diagnosis not present

## 2021-03-26 DIAGNOSIS — Z23 Encounter for immunization: Secondary | ICD-10-CM | POA: Diagnosis not present

## 2021-03-26 DIAGNOSIS — I4891 Unspecified atrial fibrillation: Secondary | ICD-10-CM | POA: Diagnosis not present

## 2021-03-26 DIAGNOSIS — M109 Gout, unspecified: Secondary | ICD-10-CM | POA: Diagnosis not present

## 2021-03-26 DIAGNOSIS — D6869 Other thrombophilia: Secondary | ICD-10-CM | POA: Diagnosis not present

## 2021-04-09 DIAGNOSIS — M47816 Spondylosis without myelopathy or radiculopathy, lumbar region: Secondary | ICD-10-CM | POA: Diagnosis not present

## 2021-04-16 ENCOUNTER — Other Ambulatory Visit: Payer: Self-pay | Admitting: Physician Assistant

## 2021-04-20 DIAGNOSIS — F411 Generalized anxiety disorder: Secondary | ICD-10-CM | POA: Diagnosis not present

## 2021-04-20 DIAGNOSIS — G4700 Insomnia, unspecified: Secondary | ICD-10-CM | POA: Diagnosis not present

## 2021-05-04 ENCOUNTER — Encounter: Payer: Self-pay | Admitting: Interventional Cardiology

## 2021-05-14 ENCOUNTER — Ambulatory Visit: Payer: PPO | Admitting: Sports Medicine

## 2021-05-14 VITALS — BP 129/54 | Ht 72.0 in | Wt 200.0 lb

## 2021-05-14 DIAGNOSIS — R269 Unspecified abnormalities of gait and mobility: Secondary | ICD-10-CM

## 2021-05-14 NOTE — Progress Notes (Signed)
Patient ID: Gary [name], male   DOB: 1950-03-15, 71 y.o.   MRN: [id number] ? ?[name] presents today for new custom orthotics.  His only complaint today is some left-sided low back pain.  He has a history of rather significant spinal stenosis and is followed by neurosurgery (Dr Saintclair Halsted).  He has had some physical therapy in the past.  He is also had spinal injections in the past but there is some question as to whether or not that triggered atrial fibrillation.  He is asking if I have any additional advice for his spinal stenosis.  Unfortunately, I really do not.  Hopefully the cushioning provided with his new custom orthotics will help some with that.  Otherwise, I have encouraged him to continue with his physical therapy and home exercises as he has done in the past. ? ?Custom orthotics were created as below.  She will found them to be comfortable prior to leaving the office.  He will follow-up with me as needed. ? ?Patient was fitted for a : standard, cushioned, semi-rigid orthotic. ?The orthotic was heated and afterward the patient stood on the orthotic blank positioned on the orthotic stand. ?The patient was positioned in subtalar neutral position and 10 degrees of ankle dorsiflexion in a weight bearing stance. ?After completion of molding, a stable base was applied to the orthotic blank. ?The blank was ground to a stable position for weight bearing. ?Size: 14 ?Base: [facility] EVA ?Posting: Bilateral fifth ray posts ?Additional orthotic padding: Bilateral metatarsal pads ? ?This note was dictated using Dragon naturally speaking software and may contain errors in syntax, spelling, or content which have not been identified prior to signing this note.  ?

## 2021-06-03 ENCOUNTER — Encounter (HOSPITAL_COMMUNITY): Payer: Self-pay | Admitting: Physician Assistant

## 2021-06-03 ENCOUNTER — Ambulatory Visit (HOSPITAL_COMMUNITY)
Admission: RE | Admit: 2021-06-03 | Discharge: 2021-06-03 | Disposition: A | Discharge status: Home / Self Care | Payer: PPO | Source: Ambulatory Visit | Attending: Physician Assistant | Admitting: Physician Assistant

## 2021-06-03 VITALS — BP 114/72 | HR 51 | Ht 72.0 in | Wt 198.8 lb

## 2021-06-03 DIAGNOSIS — I4819 Other persistent atrial fibrillation: Secondary | ICD-10-CM | POA: Diagnosis not present

## 2021-06-03 DIAGNOSIS — Z7901 Long term (current) use of anticoagulants: Secondary | ICD-10-CM | POA: Insufficient documentation

## 2021-06-03 DIAGNOSIS — Z79899 Other long term (current) drug therapy: Secondary | ICD-10-CM | POA: Diagnosis not present

## 2021-06-03 DIAGNOSIS — Z8679 Personal history of other diseases of the circulatory system: Secondary | ICD-10-CM | POA: Diagnosis not present

## 2021-06-03 DIAGNOSIS — I480 Paroxysmal atrial fibrillation: Secondary | ICD-10-CM | POA: Diagnosis not present

## 2021-06-03 DIAGNOSIS — I4892 Unspecified atrial flutter: Secondary | ICD-10-CM

## 2021-06-03 DIAGNOSIS — I10 Essential (primary) hypertension: Secondary | ICD-10-CM | POA: Diagnosis not present

## 2021-06-03 DIAGNOSIS — I252 Old myocardial infarction: Secondary | ICD-10-CM | POA: Insufficient documentation

## 2021-06-03 DIAGNOSIS — D6859 Other primary thrombophilia: Secondary | ICD-10-CM | POA: Diagnosis not present

## 2021-06-03 DIAGNOSIS — E785 Hyperlipidemia, unspecified: Secondary | ICD-10-CM | POA: Diagnosis not present

## 2021-06-03 LAB — BASIC METABOLIC PANEL
Anion gap: 8 (ref 5–15)
BUN: 13 mg/dL (ref 8–23)
CO2: 23 mmol/L (ref 22–32)
Calcium: 9.1 mg/dL (ref 8.9–10.3)
Chloride: 107 mmol/L (ref 98–111)
Creatinine, Ser: 0.88 mg/dL (ref 0.61–1.24)
GFR, Estimated: >60 mL/min (ref >60)
Glucose, Bld: 93 mg/dL (ref 70–99)
Potassium: 4.2 mmol/L (ref 3.5–5.1)
Sodium: 138 mmol/L (ref 135–145)

## 2021-06-03 LAB — MAGNESIUM: Magnesium: 2.2 mg/dL (ref 1.7–2.4)

## 2021-06-03 MED ORDER — METOPROLOL SUCCINATE ER 25 MG PO TB24: 12.5000 mg | ORAL_TABLET | Freq: Every day | ORAL | 3 refills | Status: DC

## 2021-06-03 NOTE — Progress Notes (Signed)
? ? ?Primary Care Physician: [name], MD ?Primary Cardiologist: Dr Irish Lack  ?Primary Electrophysiologist: Dr Rayann Heman (formerly) ?Referring Physician: Dr Rayann Heman ? ? ?Gary [name] is a 71 y.o. male with a history of NICM, HTN, HLD, atrial flutter, and atrial fibrillation who presents for follow up in the [facility]. Patient is on Eliquis for a CHADS2VASC score of 2. He has been maintained on dofetilide for his afib and is s/p afib ablation 02/2018. He has had brief (10-15 minute) episodes of afib since ablation but on 04/23/20 he noted a persistently elevated heart rate. He took an extra dose of his BB but this did not slow his heart rate. He was set up for DCCV but spontaneously converted to SR.  ? ?On follow up today, patient reports that he has done reasonably well since his last visit. About once per month he will have some heart racing which lasts about one hour. He denies any bleeding issues on anticoagulation.  ? ?Today, he denies symptoms of palpitations, chest pain, orthopnea, PND, lower extremity edema, dizziness, presyncope, syncope, snoring, daytime somnolence, bleeding, or neurologic sequela. The patient is tolerating medications without difficulties and is otherwise without complaint today.  ? ? ?Atrial Fibrillation Risk Factors: ? ?he does not have symptoms or diagnosis of sleep apnea.. ?he does not have a history of rheumatic fever. ? ? ?he has a BMI of Body mass index is 26.96 kg/m?Marland KitchenMarland Kitchen ?Filed Weights  ? 06/03/21 1101  ?Weight: 90.2 kg  ? ? ? ?Family History  ?Problem Relation Age of Onset  ? Heart attack Father   ? Hypertension Father   ? Hypertension Mother   ? ? ? ?Atrial Fibrillation Management history: ? ?Previous antiarrhythmic drugs: dofetilide ?Previous cardioversions: 2018, 2019, 2020 x 4 ?Previous ablations: 02/2018 ?CHADS2VASC score: 2 ?Anticoagulation history: Eliquis ? ? ?Past Medical History:  ?Diagnosis Date  ? Arthritis   ? Atrial flutter ([place]) 2007  ?  s/p CTI ablation by Dr Lovena Le  ? Back pain   ? resolved per pt 12/06/18  ? Fibromyalgia   ? GERD (gastroesophageal reflux disease)   ? Hyperlipidemia   ? Hypertension   ? Insomnia   ? occasional per pt 12/06/18  ? NICM (nonischemic cardiomyopathy) ([place])   ? a. 2005 - tachy mediated. normalized after return of NSR.  ? Obesity   ? Paroxysmal atrial fibrillation (HCC)   ? Pneumonia   ? x 1  ? Spinal stenosis   ? ?Past Surgical History:  ?Procedure Laterality Date  ? ATRIAL FIBRILLATION ABLATION N/A 03/22/2018  ? Procedure: ATRIAL FIBRILLATION ABLATION;  Surgeon: Thompson Grayer, MD;  Location: [facility];  Service: Cardiovascular;  Laterality: N/A;  ? atrial flutter ablation  06/10/2005  ? CTI ablation by Dr Lovena Le for atrial flutter  ? CARDIOVERSION N/A 07/20/2016  ? Procedure: CARDIOVERSION;  Surgeon: Minus Breeding, MD;  Location: [place];  Service: Cardiovascular;  Laterality: N/A;  ? CARDIOVERSION N/A 12/13/2017  ? Procedure: CARDIOVERSION;  Surgeon: Thayer Headings, MD;  Location: [place];  Service: Cardiovascular;  Laterality: N/A;  ? CARDIOVERSION N/A 01/30/2018  ? Procedure: CARDIOVERSION;  Surgeon: Fay Records, MD;  Location: [place];  Service: Cardiovascular;  Laterality: N/A;  ? CARDIOVERSION N/A 06/27/2018  ? Procedure: CARDIOVERSION;  Surgeon: Buford Dresser, MD;  Location: [place];  Service: Cardiovascular;  Laterality: N/A;  ? CARDIOVERSION N/A 08/16/2018  ? Procedure: CARDIOVERSION;  Surgeon: [name], MD;  Location: [place];  Service: Cardiovascular;  Laterality: N/A;  ? CARDIOVERSION N/A 08/28/2018  ? Procedure: CARDIOVERSION;  Surgeon: Thayer Headings, MD;  Location: [facility];  Service: Cardiovascular;  Laterality: N/A;  ? COLONOSCOPY    ? EYE SURGERY Bilateral 2019  ? cataracts removed  ? TOTAL HIP ARTHROPLASTY Left 12/12/2018  ? Procedure: LEFT TOTAL HIP ARTHROPLASTY ANTERIOR APPROACH;  Surgeon: Mcarthur Rossetti, MD;  Location: [place];  Service:  Orthopedics;  Laterality: Left;  ? WISDOM TOOTH EXTRACTION    ? ? ?Current Outpatient Medications  ?Medication Sig Dispense Refill  ? acetaminophen (TYLENOL) 500 MG tablet Take 1,000 mg by mouth every 6 (six) hours as needed (pain).    ? calcium carbonate (TUMS - DOSED IN MG ELEMENTAL CALCIUM) 500 MG chewable tablet Chew 1-2 tablets by mouth 2 (two) times daily as needed for indigestion or heartburn.    ? cyclobenzaprine (FLEXERIL) 10 MG tablet Take 1 tablet (10 mg total) by mouth 2 (two) times daily as needed for muscle spasms. 20 tablet 0  ? diltiazem (CARDIZEM CD) 180 MG 24 hr capsule TAKE ONE CAPSULE BY MOUTH IN THE EVENING 90 capsule 1  ? dofetilide (TIKOSYN) 500 MCG capsule TAKE ONE CAPSULE BY MOUTH TWICE DAILY 180 capsule 2  ? ELIQUIS 5 MG TABS tablet TAKE ONE TABLET BY MOUTH TWICE DAILY 60 tablet 6  ? famotidine (PEPCID) 20 MG tablet Take 20 mg by mouth daily as needed for heartburn or indigestion.    ? Flaxseed, Linseed, (FLAXSEED OIL) 1400 MG CAPS Take by mouth.    ? metoprolol succinate (TOPROL-XL) 25 MG 24 hr tablet TAKE ONE TABLET EVERY MORNING AND HALF TABLET EVERY EVENING 135 tablet 3  ? Multiple Vitamins-Minerals (MULTIVITAMIN ADULTS 50+ PO) Take by mouth.    ? rosuvastatin (CRESTOR) 10 MG tablet Take 1 tablet (10 mg total) by mouth daily. 90 tablet 3  ? sildenafil (VIAGRA) 50 MG tablet Take 25 mg by mouth daily as needed for erectile dysfunction.    ? simethicone (MYLICON) 80 MG chewable tablet Chew 80-160 mg by mouth every 6 (six) hours as needed for flatulence.    ? valACYclovir (VALTREX) 1000 MG tablet Take 2,000 mg by mouth every 12 (twelve) hours as needed (cold sore).     ? zolpidem (AMBIEN) 10 MG tablet Take 5 mg by mouth at bedtime as needed for sleep.    ? ?Current Facility-Administered Medications  ?Medication Dose Route Frequency Provider Last Rate Last Admin  ? 0.9 %  sodium chloride infusion   Intravenous PRN [name], MD      ? ? ?Allergies  ?Allergen Reactions  ? Penicillins  Rash  ?  DID THE REACTION INVOLVE: Swelling of the face/tongue/throat, SOB, or low BP? No ?Sudden or severe rash/hives, skin peeling, or the inside of the mouth or nose? Unknown ?Did it require medical treatment? Unknown ?When did it last happen?      Childhood Allergy ?If all above answers are ?NO?, may proceed with cephalosporin use. ? ?  ? ? ?Social History  ? ?Socioeconomic History  ? Marital status: Married  ?  Spouse name: Not on file  ? Number of children: Not on file  ? Years of education: Not on file  ? Highest education level: Not on file  ?Occupational History  ? Not on file  ?Tobacco Use  ? Smoking status: Former  ?  Packs/day: 0.50  ?  Years: 7.00  ?  Pack years: 3.50  ?  Types: Cigarettes  ?  Quit date:  2004  ?  Years since quitting: 19.3  ? Smokeless tobacco: Never  ? Tobacco comments:  ?  Former smoker 06/03/21  ?Vaping Use  ? Vaping Use: Never used  ?Substance and Sexual Activity  ? Alcohol use: Yes  ?  Alcohol/week: 3.0 - 4.0 standard drinks  ?  Types: 3 - 4 Glasses of wine per week  ?  Comment: since 2020 cut back to 1-2 drinks per week  ? Drug use: No  ? Sexual activity: Yes  ?Other Topics Concern  ? Not on file  ?Social History Narrative  ? Lives in [place]  ? Retired from [employer]  ? ?Social Determinants of Health  ? ?Financial Resource Strain: Not on file  ?Food Insecurity: Not on file  ?Transportation Needs: Not on file  ?Physical Activity: Not on file  ?Stress: Not on file  ?Social Connections: Not on file  ?Intimate Partner Violence: Not on file  ? ? ? ?ROS- All systems are reviewed and negative except as per the HPI above. ? ?Physical Exam: ?Vitals:  ? 06/03/21 1101  ?BP: 114/72  ?Pulse: (!) 51  ?Weight: 90.2 kg  ?Height: 6' (1.829 m)  ? ? ? ?GEN- The patient is a well appearing male, alert and oriented x 3 today.   ?HEENT-head normocephalic, atraumatic, sclera clear, conjunctiva pink, hearing intact, trachea midline. ?Lungs- Clear to ausculation bilaterally, normal work of breathing ?Heart-  Regular rate and rhythm, bradycardia, no murmurs, rubs or gallops  ?GI- soft, NT, ND, + BS ?Extremities- no clubbing, cyanosis, or edema ?MS- no significant deformity or atrophy ?Skin- no rash or lesi

## 2021-06-11 ENCOUNTER — Encounter (HOSPITAL_COMMUNITY): Payer: Self-pay | Admitting: Emergency Medicine

## 2021-06-11 ENCOUNTER — Emergency Department (HOSPITAL_COMMUNITY)
Admission: EM | Admit: 2021-06-11 | Discharge: 2021-06-11 | Disposition: A | Discharge status: Home / Self Care | Payer: PPO | Attending: Emergency Medicine | Admitting: Emergency Medicine

## 2021-06-11 DIAGNOSIS — R0789 Other chest pain: Secondary | ICD-10-CM | POA: Insufficient documentation

## 2021-06-11 DIAGNOSIS — I4891 Unspecified atrial fibrillation: Secondary | ICD-10-CM | POA: Diagnosis not present

## 2021-06-11 DIAGNOSIS — T462X1A Poisoning by other antidysrhythmic drugs, accidental (unintentional), initial encounter: Secondary | ICD-10-CM | POA: Insufficient documentation

## 2021-06-11 DIAGNOSIS — I10 Essential (primary) hypertension: Secondary | ICD-10-CM | POA: Insufficient documentation

## 2021-06-11 DIAGNOSIS — Z79899 Other long term (current) drug therapy: Secondary | ICD-10-CM | POA: Insufficient documentation

## 2021-06-11 DIAGNOSIS — Z7901 Long term (current) use of anticoagulants: Secondary | ICD-10-CM | POA: Diagnosis not present

## 2021-06-11 DIAGNOSIS — R739 Hyperglycemia, unspecified: Secondary | ICD-10-CM | POA: Diagnosis not present

## 2021-06-11 DIAGNOSIS — T50901A Poisoning by unspecified drugs, medicaments and biological substances, accidental (unintentional), initial encounter: Secondary | ICD-10-CM

## 2021-06-11 DIAGNOSIS — R42 Dizziness and giddiness: Secondary | ICD-10-CM | POA: Insufficient documentation

## 2021-06-11 DIAGNOSIS — Z8679 Personal history of other diseases of the circulatory system: Secondary | ICD-10-CM

## 2021-06-11 LAB — CBC WITH DIFFERENTIAL/PLATELET
Abs Immature Granulocytes: 0.01 10*3/uL (ref 0.00–0.07)
Basophils Absolute: 0.1 10*3/uL (ref 0.0–0.1)
Basophils Relative: 1 %
Eosinophils Absolute: 0.1 10*3/uL (ref 0.0–0.5)
Eosinophils Relative: 1 %
HCT: 42.5 % (ref 39.0–52.0)
Hemoglobin: 14.4 g/dL (ref 13.0–17.0)
Immature Granulocytes: 0 %
Lymphocytes Relative: 20 %
Lymphs Abs: 1.5 10*3/uL (ref 0.7–4.0)
MCH: 32.4 pg (ref 26.0–34.0)
MCHC: 33.9 g/dL (ref 30.0–36.0)
MCV: 95.7 fL (ref 80.0–100.0)
Monocytes Absolute: 0.6 10*3/uL (ref 0.1–1.0)
Monocytes Relative: 8 %
Neutro Abs: 5.4 10*3/uL (ref 1.7–7.7)
Neutrophils Relative %: 70 %
Platelets: 163 10*3/uL (ref 150–400)
RBC: 4.44 MIL/uL (ref 4.22–5.81)
RDW: 14 % (ref 11.5–15.5)
WBC: 7.7 10*3/uL (ref 4.0–10.5)
nRBC: 0 % (ref 0.0–0.2)

## 2021-06-11 LAB — MAGNESIUM: Magnesium: 2.1 mg/dL (ref 1.7–2.4)

## 2021-06-11 LAB — COMPREHENSIVE METABOLIC PANEL WITH GFR
ALT: 23 U/L (ref 0–44)
AST: 20 U/L (ref 15–41)
Albumin: 3.6 g/dL (ref 3.5–5.0)
Alkaline Phosphatase: 52 U/L (ref 38–126)
Anion gap: 6 (ref 5–15)
BUN: 13 mg/dL (ref 8–23)
CO2: 23 mmol/L (ref 22–32)
Calcium: 8.9 mg/dL (ref 8.9–10.3)
Chloride: 106 mmol/L (ref 98–111)
Creatinine, Ser: 0.91 mg/dL (ref 0.61–1.24)
GFR, Estimated: >60 mL/min
Glucose, Bld: 148 mg/dL — ABNORMAL HIGH (ref 70–99)
Potassium: 3.9 mmol/L (ref 3.5–5.1)
Sodium: 135 mmol/L (ref 135–145)
Total Bilirubin: 0.7 mg/dL (ref 0.3–1.2)
Total Protein: 6.1 g/dL — ABNORMAL LOW (ref 6.5–8.1)

## 2021-06-11 LAB — TROPONIN I (HIGH SENSITIVITY)
Troponin I (High Sensitivity): 3 ng/L
Troponin I (High Sensitivity): 4 ng/L (ref <18)

## 2021-06-11 NOTE — ED Notes (Signed)
The patient tolerated orthostatic vitals very well. The patient denied any dizziness or any unsteadiness. This EMT observed the patient to be steady on his feet with no visible signs of orthostatic change.

## 2021-06-11 NOTE — ED Provider Triage Note (Signed)
Emergency Medicine Provider Triage Evaluation Note  Gary [name] , a 71 y.o. male  was evaluated in triage.  Pt complains of lightheadedness, chest pressure.  Accidentally took an additional dose of Tikosyn and Eliquis approximately 1-1/2 hours ago.  Also took an additional dose of metoprolol although this was only 12.5 mg additionally.  Since then he has been feeling slightly lightheaded with some chest pressure.  He is unsure if this is related to the medication or "just the stress from knowing I made a mistake."  Denies any symptoms prior to the additional medications.  Denies any shortness of breath, headache, blurry vision, numbness in arms or legs.  Review of Systems  Positive: Chest pressure, lightheadedness Negative: Above  Physical Exam  BP (!) 160/82 (BP Location: Right Arm)   Pulse 63   Temp 98.4 F (36.9 C) (Oral)   Resp 16   SpO2 99%  Gen:   Awake, no distress   Resp:  Normal effort  MSK:   Moves extremities without difficulty  Other:  Speaking complete sentence without difficulty.  No aphasia.  Strength 5/5 in bilateral upper and lower extremities, normal gait  Medical Decision Making  Medically screening exam initiated at 1:20 PM.  Appropriate orders placed.  Gary [name] was informed that the remainder of the evaluation will be completed by another provider, this initial triage assessment does not replace that evaluation, and the importance of remaining in the ED until their evaluation is complete.  Labs ordered   Delia Heady, [place] 06/11/21 1326

## 2021-06-11 NOTE — Progress Notes (Signed)
Discussed case with Dr Curt Bears, AF clinic, and clinical pharmacist.  Pt should HOLD tikosyn tonight. No further doses for today, 5/18.  Per pharmacy, As he has maintained NSR, he can take his eliquis again tomorrow am, 5/19.   He should take Tikosyn at 0800 tomorrow, then come to the AF clinic for an EKG  between the times of 1000 and 1100, specifically within that hour.   Same communicated to ED providers and  patient.  Gary [name] [address] [place] at [place], [place]

## 2021-06-11 NOTE — Discharge Instructions (Signed)
Do not take your blood thinner or Tikosin tonight You may take this medication at 8 AM in the morning.  Follow-up with the atrial fibrillation clinic at 57  Return for new or worsening symptoms

## 2021-06-11 NOTE — ED Notes (Signed)
Provider at bedside at this time

## 2021-06-11 NOTE — ED Provider Notes (Signed)
Care assumed from previous provider at shift change. See note for full HPI  Accidental overdose on Tikosin extra dose, Eliquis  Per poison control Repeat EKG at 1758...  1915 Obs>> can dc home after if sx improved Physical Exam  BP (!) 145/64 (BP Location: Left Arm)   Pulse (!) 54   Temp 98.4 F (36.9 C) (Oral)   Resp 20   SpO2 100%   Physical Exam Vitals and nursing note reviewed.  Constitutional:      General: He is not in acute distress.    Appearance: He is well-developed. He is not ill-appearing or diaphoretic.  HENT:     Head: Atraumatic.  Eyes:     Pupils: Pupils are equal, round, and reactive to light.  Cardiovascular:     Rate and Rhythm: Normal rate and regular rhythm.  Pulmonary:     Effort: Pulmonary effort is normal. No respiratory distress.  Abdominal:     General: There is no distension.     Palpations: Abdomen is soft.  Musculoskeletal:        General: Normal range of motion.     Cervical back: Normal range of motion and neck supple.  Skin:    General: Skin is warm and dry.  Neurological:     General: No focal deficit present.     Mental Status: He is alert and oriented to person, place, and time.    Procedures  Procedures Labs Reviewed  COMPREHENSIVE METABOLIC PANEL - Abnormal; Notable for the following components:      Result Value   Glucose, Bld 148 (*)    Total Protein 6.1 (*)    All other components within normal limits  CBC WITH DIFFERENTIAL/PLATELET  MAGNESIUM  TROPONIN I (HIGH SENSITIVITY)  TROPONIN I (HIGH SENSITIVITY)   No results found.  ED Course / MDM     Pleasant 71 year old history of atrial fibrillation here for evaluation of accidental overdose of Eliquis and Tikosin 3 hours PTA.  Per poison control obs until 1915  Labs and imaging personally viewed and interpreted EKG without ischemic changes, no prolonged QT interval CBC without anemia, no leukocytosis Metabolic panel mild hyperglycemia at 148 Delta troponin  flat Mag 2.1  CONSULT with [place] PA-C with EP. Recommends Skip Tikosin dose tonight. Take morning dose at 8 AM, Follow up in Afib clinic 2-3 hours later for repeat EKG  Patient reassessed.  Remains asymptomatic.  He has been observed here in the emergency department.  I discussed medication management, he will follow-up with EP in the morning.  Discussed strict return precautions.  He will return for new or worsening symptoms.  The patient has been appropriately medically screened and/or stabilized in the ED. I have low suspicion for any other emergent medical condition which would require further screening, evaluation or treatment in the ED or require inpatient management.  Patient is hemodynamically stable and in no acute distress.  Patient able to ambulate in department prior to ED.  Evaluation does not show acute pathology that would require ongoing or additional emergent interventions while in the emergency department or further inpatient treatment.  I have discussed the diagnosis with the patient and answered all questions.  Pain is been managed while in the emergency department and patient has no further complaints prior to discharge.  Patient is comfortable with plan discussed in room and is stable for discharge at this time.  I have discussed strict return precautions for returning to the emergency department.  Patient was encouraged to follow-up  with PCP/specialist refer to at discharge.    Medical Decision Making Amount and/or Complexity of Data Reviewed Independent Historian: spouse External Data Reviewed: labs, radiology, ECG and notes. Labs: ordered. Decision-making details documented in ED Course. ECG/medicine tests: ordered and independent interpretation performed. Decision-making details documented in ED Course.  Risk OTC drugs. Prescription drug management. Drug therapy requiring intensive monitoring for toxicity. Decision regarding hospitalization. Diagnosis or treatment  significantly limited by social determinants of health.    Accidental overdose A. Fib Dizziness Chronic anticoagulation      Gary [name] A, PA-C 06/11/21 1939    Isla Pence, MD 06/11/21 2151

## 2021-06-11 NOTE — ED Provider Notes (Signed)
[facility] EMERGENCY DEPARTMENT Provider Note   CSN: [id number] Arrival date & time: 06/11/21  1302     History  Chief Complaint  Patient presents with   Dizziness    Gary [name] is a 71 y.o. male.  With past medical history of nonischemic cardiomyopathy, hypertension, atrial fibrillation and flutter anticoagulated on Eliquis who presents to the emergency department for dizziness.  States he took 2 doses of his dofetilide today.  He states that this morning around 0815 he was taking his morning medication and took his dofetilide and Eliquis.  States that he got busy and came back around to his pill box and noticed that he had not taken his Thursday doses.  He states that he then took his dofetilide and Eliquis again on accident.  This was around 1115.  He states that after this he realized that he accidentally took Tuesdays medication rather than Thursdays which is why his Thursday medication was still in the box.  He states that since then he has had some lightheadedness and central chest tightness.  He denies any syncope, nausea, radiating chest pain, palpitations or shortness of breath.   Dizziness Associated symptoms: chest pain   Associated symptoms: no nausea, no palpitations and no shortness of breath       Home Medications Prior to Admission medications   Medication Sig Start Date End Date Taking? Authorizing Provider  acetaminophen (TYLENOL) 500 MG tablet Take 1,000 mg by mouth every 6 (six) hours as needed (pain).    [provider]  calcium carbonate (TUMS - DOSED IN MG ELEMENTAL CALCIUM) 500 MG chewable tablet Chew 1-2 tablets by mouth 2 (two) times daily as needed for indigestion or heartburn.    [provider]  cyclobenzaprine (FLEXERIL) 10 MG tablet Take 1 tablet (10 mg total) by mouth 2 (two) times daily as needed for muscle spasms. 10/13/19   Breck Coons, MD  diltiazem (CARDIZEM CD) 180 MG 24 hr capsule TAKE ONE CAPSULE BY  MOUTH IN THE EVENING 12/29/20   Sherran Needs, NP  dofetilide (TIKOSYN) 500 MCG capsule TAKE ONE CAPSULE BY MOUTH TWICE DAILY 04/17/21   Jettie Booze, MD  ELIQUIS 5 MG TABS tablet TAKE ONE TABLET BY MOUTH TWICE DAILY 02/09/21   Jettie Booze, MD  famotidine (PEPCID) 20 MG tablet Take 20 mg by mouth daily as needed for heartburn or indigestion.    [provider]  Flaxseed, Linseed, (FLAXSEED OIL) 1400 MG CAPS Take by mouth.    [provider]  metoprolol succinate (TOPROL-XL) 25 MG 24 hr tablet Take 0.5 tablets (12.5 mg total) by mouth daily. 06/03/21   Fenton, Clint R, PA  Multiple Vitamins-Minerals (MULTIVITAMIN ADULTS 50+ PO) Take by mouth.    [provider]  rosuvastatin (CRESTOR) 10 MG tablet Take 1 tablet (10 mg total) by mouth daily. 01/29/21   Jettie Booze, MD  sildenafil (VIAGRA) 50 MG tablet Take 25 mg by mouth daily as needed for erectile dysfunction.    [provider]  simethicone (MYLICON) 80 MG chewable tablet Chew 80-160 mg by mouth every 6 (six) hours as needed for flatulence.    [provider]  valACYclovir (VALTREX) 1000 MG tablet Take 2,000 mg by mouth every 12 (twelve) hours as needed (cold sore).  12/04/12   [provider]  zolpidem (AMBIEN) 10 MG tablet Take 5 mg by mouth at bedtime as needed for sleep.    [provider]  Allergies    Penicillins    Review of Systems   Review of Systems  Constitutional:  Negative for diaphoresis.  Respiratory:  Positive for chest tightness. Negative for shortness of breath.   Cardiovascular:  Positive for chest pain. Negative for palpitations and leg swelling.  Gastrointestinal:  Negative for nausea.  Neurological:  Positive for dizziness and light-headedness. Negative for syncope.  All other systems reviewed and are negative.  Physical Exam Updated Vital Signs BP (!) 160/82 (BP Location: Right Arm)   Pulse 63   Temp 98.4 F (36.9 C) (Oral)    Resp 16   SpO2 99%  Physical Exam Vitals and nursing note reviewed.  Constitutional:      General: He is not in acute distress.    Appearance: Normal appearance. He is normal weight. He is not ill-appearing or toxic-appearing.  HENT:     Head: Normocephalic and atraumatic.     Mouth/Throat:     Mouth: Mucous membranes are moist.     Pharynx: Oropharynx is clear.  Eyes:     General: No scleral icterus.    Extraocular Movements: Extraocular movements intact.     Pupils: Pupils are equal, round, and reactive to light.  Cardiovascular:     Rate and Rhythm: Normal rate and regular rhythm.     Pulses: Normal pulses.     Heart sounds: No murmur heard. Pulmonary:     Effort: Pulmonary effort is normal. No respiratory distress.     Breath sounds: Normal breath sounds.  Abdominal:     General: Bowel sounds are normal.     Palpations: Abdomen is soft.  Musculoskeletal:        General: Normal range of motion.     Cervical back: Neck supple.  Skin:    General: Skin is warm and dry.     Capillary Refill: Capillary refill takes less than 2 seconds.  Neurological:     General: No focal deficit present.     Mental Status: He is alert and oriented to person, place, and time. Mental status is at baseline.  Psychiatric:        Mood and Affect: Mood normal.        Behavior: Behavior normal.        Thought Content: Thought content normal.        Judgment: Judgment normal.    ED Results / Procedures / Treatments   Labs (all labs ordered are listed, but only abnormal results are displayed) Labs Reviewed  CBC WITH DIFFERENTIAL/PLATELET  COMPREHENSIVE METABOLIC PANEL  TROPONIN I (HIGH SENSITIVITY)  TROPONIN I (HIGH SENSITIVITY)   EKG EKG Interpretation  Date/Time:  Thursday Jun 11 2021 13:05:33 EDT Ventricular Rate:  55 PR Interval:  204 QRS Duration: 94 QT Interval:  438 QTC Calculation: 419 R Axis:   62 Text Interpretation: Sinus bradycardia with Premature atrial complexes in  a pattern of bigeminy Otherwise normal ECG No significant change since last tracing Confirmed by Deno Etienne [phone]) on 06/11/2021 2:01:58 PM  Radiology No results found.  Procedures Procedures   Medications Ordered in ED Medications - No data to display  ED Course/ Medical Decision Making/ A&P                           Medical Decision Making This patient presents to the ED for concern of taking too much dofetilide, this involves an extensive number of treatment options, and is a complaint that carries with it  a high risk of complications and morbidity.  The differential diagnosis includes arrhythmia, syncope, ACS, etc.  Co morbidities that complicate the patient evaluation Atrial for atrial flutter  Additional history obtained:  Additional history obtained from: Wife at bedside External records from outside source obtained and reviewed including: Most recent A-fib clinic notes  EKG: EKG: sinus bradycardia, PAC's noted.   Cardiac Monitoring: The patient was maintained on a cardiac monitor.  I personally viewed and interpreted the cardiac monitored which showed an underlying rhythm of: Sinus bradycardia  Lab Results: I personally ordered, reviewed, and interpreted labs. Pertinent results include: CBC within normal limits CMP, mag and troponin all pending  Medications  -I reviewed the patient's home medications and did not make adjustments. -I did not prescribe new home medications.  Tests Considered: Chest x-ray, however feel that it is not necessary at this time  Critical Interventions: Observation  SDH None identified  ED Course: 71 year old male who presents emergency department after taking 2 doses of his dofetilide this morning.  First dose at 0815, second dose at 1115. He is currently hemodynamically stable and well-appearing.  Complaining of some lightheadedness and chest tightness. Not orthostatic Initial EKG with sinus bradycardia without QTc  prolongation Labs are pending  1448: Spoke with [employer]  They state that there is concern for hospital QTc prolongation and torsades.  They recommend that we check electrolytes including magnesium.  They also request new recommendations -Electrolytes including magnesium  -Repeat EKG at 1748 -Total of 8 hours of observation to assess for conduction disturbances.  8 hours from last ingestion will be at 1915.  Care of patient being handed off to [place], PA-C at this time.  Recommendations as noted above.  If he has no QTc prolongation or further disturbances while he is being observed him to be discharged.  Should not take his dofetilide tomorrow.  Can restart the next day.  Dispo pending completion of his observation. After consideration of the diagnostic results and the patients response to treatment, I feel that the patent would benefit from observation.  Final Clinical Impression(s) / ED Diagnoses Final diagnoses:  Dizziness    Rx / DC Orders ED Discharge Orders     None         Mickie Hillier, PA-C 06/11/21 [place], DO 06/11/21 1536

## 2021-06-11 NOTE — ED Notes (Signed)
Received verbal report from [place] at this time

## 2021-06-11 NOTE — ED Triage Notes (Signed)
Patient here with complaint of lightheadedness after accidentally taking an extra dose of tikosin this morning. Patient is alert, oriented, ambulatory, and in no apparent distress at this time.

## 2021-06-12 ENCOUNTER — Ambulatory Visit (HOSPITAL_COMMUNITY)
Admission: RE | Admit: 2021-06-12 | Discharge: 2021-06-12 | Disposition: A | Discharge status: Home / Self Care | Payer: PPO | Source: Ambulatory Visit | Attending: Physician Assistant | Admitting: Physician Assistant

## 2021-06-12 ENCOUNTER — Encounter (HOSPITAL_COMMUNITY): Payer: Self-pay

## 2021-06-12 VITALS — HR 52

## 2021-06-12 DIAGNOSIS — I4892 Unspecified atrial flutter: Secondary | ICD-10-CM | POA: Diagnosis not present

## 2021-06-12 DIAGNOSIS — R001 Bradycardia, unspecified: Secondary | ICD-10-CM | POA: Insufficient documentation

## 2021-06-12 DIAGNOSIS — Z79899 Other long term (current) drug therapy: Secondary | ICD-10-CM | POA: Diagnosis not present

## 2021-06-12 DIAGNOSIS — I440 Atrioventricular block, first degree: Secondary | ICD-10-CM | POA: Insufficient documentation

## 2021-06-12 NOTE — Progress Notes (Signed)
Patient presents for recheck ECG after resuming dofetilide. ECG shows SB, 1st degree AV block PR 214, QRS 84, QTc 399. QT stable. Continue dofetilide at present dose. F/u with Dr Curt Bears as scheduled. AF clinic in 6 months.

## 2021-06-29 ENCOUNTER — Other Ambulatory Visit (HOSPITAL_COMMUNITY): Payer: Self-pay | Admitting: Nurse Practitioner

## 2021-06-29 DIAGNOSIS — I480 Paroxysmal atrial fibrillation: Secondary | ICD-10-CM

## 2021-07-07 ENCOUNTER — Encounter: Payer: Self-pay | Admitting: *Deleted

## 2021-07-07 ENCOUNTER — Encounter: Payer: Self-pay | Admitting: Cardiology

## 2021-07-07 ENCOUNTER — Ambulatory Visit: Payer: PPO | Admitting: Cardiology

## 2021-07-07 VITALS — BP 120/60 | HR 55 | Ht 72.0 in | Wt 200.0 lb

## 2021-07-07 DIAGNOSIS — D6869 Other thrombophilia: Secondary | ICD-10-CM | POA: Diagnosis not present

## 2021-07-07 DIAGNOSIS — I4819 Other persistent atrial fibrillation: Secondary | ICD-10-CM

## 2021-07-07 DIAGNOSIS — Z79899 Other long term (current) drug therapy: Secondary | ICD-10-CM | POA: Diagnosis not present

## 2021-07-07 NOTE — Patient Instructions (Addendum)
Medication Instructions:  Your physician recommends that you continue on your current medications as directed. Please refer to the Current Medication list given to you today.  *If you need a refill on your cardiac medications before your next appointment, please call your pharmacy*   Lab Work: Pre procedure labs - see procedure instruction letter:  BMP & CBC  If you have labs (blood work) drawn today and your tests are completely normal, you will receive your results only by: MyChart Message (if you have MyChart) OR A paper copy in the mail If you have any lab test that is abnormal or we need to change your treatment, we will call you to review the results.   Testing/Procedures: Your physician has requested that you have cardiac CT within 7 days PRIOR to your ablation. Cardiac computed tomography (CT) is a painless test that uses an x-ray machine to take clear, detailed pictures of your heart.  Please follow instruction below located under "other instructions". You will get a call from our office to schedule the date for this test.  Your physician has recommended that you have an ablation. Catheter ablation is a medical procedure used to treat some cardiac arrhythmias (irregular heartbeats). During catheter ablation, a long, thin, flexible tube is put into a blood vessel in your groin (upper thigh), or neck. This tube is called an ablation catheter. It is then guided to your heart through the blood vessel. Radio frequency waves destroy small areas of heart tissue where abnormal heartbeats may cause an arrhythmia to start. Please follow instruction letter given to you today.   Follow-Up: At [facility], you and your health needs are our priority.  As part of our continuing mission to provide you with exceptional heart care, we have created designated Provider Care Teams.  These Care Teams include your primary Cardiologist (physician) and Advanced Practice Providers (APPs -  Physician  Assistants and Nurse Practitioners) who all work together to provide you with the care you need, when you need it.  Your next appointment:   1 month(s) after your ablation  The format for your next appointment:   In Person  Provider:   AFib clinic   Thank you for choosing [facility]!!   Serita Degroote, RN [phone]    Other Instructions  Cardiac Ablation Cardiac ablation is a procedure to destroy (ablate) some heart tissue that is sending bad signals. These bad signals cause problems in heart rhythm. The heart has many areas that make these signals. If there are problems in these areas, they can make the heart beat in a way that is not normal. Destroying some tissues can help make the heart rhythm normal. Tell your doctor about: Any allergies you have. All medicines you are taking. These include vitamins, herbs, eye drops, creams, and over-the-counter medicines. Any problems you or family members have had with medicines that make you fall asleep (anesthetics). Any blood disorders you have. Any surgeries you have had. Any medical conditions you have, such as kidney failure. Whether you are pregnant or may be pregnant. What are the risks? This is a safe procedure. But problems may occur, including: Infection. Bruising and bleeding. Bleeding into the chest. Stroke or blood clots. Damage to nearby areas of your body. Allergies to medicines or dyes. The need for a pacemaker if the normal system is damaged. Failure of the procedure to treat the problem. What happens before the procedure? Medicines Ask your doctor about: Changing or stopping your normal medicines. This is   important. Taking aspirin and ibuprofen. Do not take these medicines unless your doctor tells you to take them. Taking other medicines, vitamins, herbs, and supplements. General instructions Follow instructions from your doctor about what you cannot eat or drink. Plan to have someone take you home  from the hospital or clinic. If you will be going home right after the procedure, plan to have someone with you for 24 hours. Ask your doctor what steps will be taken to prevent infection. What happens during the procedure?  An IV tube will be put into one of your veins. You will be given a medicine to help you relax. The skin on your neck or groin will be numbed. A cut (incision) will be made in your neck or groin. A needle will be put through your cut and into a large vein. A tube (catheter) will be put into the needle. The tube will be moved to your heart. Dye may be put through the tube. This helps your doctor see your heart. Small devices (electrodes) on the tube will send out signals. A type of energy will be used to destroy some heart tissue. The tube will be taken out. Pressure will be held on your cut. This helps stop bleeding. A bandage will be put over your cut. The exact procedure may vary among doctors and hospitals. What happens after the procedure? You will be watched until you leave the hospital or clinic. This includes checking your heart rate, breathing rate, oxygen, and blood pressure. Your cut will be watched for bleeding. You will need to lie still for a few hours. Do not drive for 24 hours or as long as your doctor tells you. Summary Cardiac ablation is a procedure to destroy some heart tissue. This is done to treat heart rhythm problems. Tell your doctor about any medical conditions you may have. Tell him or her about all medicines you are taking to treat them. This is a safe procedure. But problems may occur. These include infection, bruising, bleeding, and damage to nearby areas of your body. Follow what your doctor tells you about food and drink. You may also be told to change or stop some of your medicines. After the procedure, do not drive for 24 hours or as long as your doctor tells you. This information is not intended to replace advice given to you by your  health care provider. Make sure you discuss any questions you have with your health care provider. Document Revised: 12/14/2018 Document Reviewed: 12/14/2018 Elsevier Patient Education  2023 Elsevier Inc.  

## 2021-07-07 NOTE — Progress Notes (Signed)
Electrophysiology Office Note   Date:  07/07/2021   ID:  Gary [name], DOB 11-24-1950, MRN [id number]  PCP:  [name], MD  Cardiologist:  Irish Lack Primary Electrophysiologist:  Akshat Minehart Meredith Leeds, MD    Chief Complaint: AF   History of Present Illness: Gary [name] is a 71 y.o. male who is being seen today for the evaluation of AF at the request of [name], MD. Presenting today for electrophysiology evaluation.  The history significant for nonischemic cardiomyopathy, hypertension, hyperlipidemia, atrial flutter, atrial fibrillation.  He has been maintained on dofetilide.  He is status post atrial fibrillation ablation in 2020 with PVI and posterior wall isolation.  After his ablation he was loaded on dofetilide.  He has had minimal episodes of atrial fibrillation since starting dofetilide, though he is having some difficulty taking it.  He is concerned about the medication interactions with other medications.  He is also concerned about the strictness of the timing of taking the medication.  He would be interested in getting off of dofetilide.  He is interested in a repeat ablation.  Today, he denies symptoms of palpitations, chest pain, shortness of breath, orthopnea, PND, lower extremity edema, claudication, dizziness, presyncope, syncope, bleeding, or neurologic sequela. The patient is tolerating medications without difficulties.    Past Medical History:  Diagnosis Date   Arthritis    Atrial flutter ([place]) 2007   s/p CTI ablation by Dr Lovena Le   Back pain    resolved per pt 12/06/18   Fibromyalgia    GERD (gastroesophageal reflux disease)    Hyperlipidemia    Hypertension    Insomnia    occasional per pt 12/06/18   NICM (nonischemic cardiomyopathy) ([place])    a. 2005 - tachy mediated. normalized after return of NSR.   Obesity    Paroxysmal atrial fibrillation ([facility])    Pneumonia    x 1   Spinal stenosis    Past Surgical History:  Procedure Laterality  Date   ATRIAL FIBRILLATION ABLATION N/A 03/22/2018   Procedure: ATRIAL FIBRILLATION ABLATION;  Surgeon: Thompson Grayer, MD;  Location: [facility];  Service: Cardiovascular;  Laterality: N/A;   atrial flutter ablation  06/10/2005   CTI ablation by Dr Lovena Le for atrial flutter   CARDIOVERSION N/A 07/20/2016   Procedure: CARDIOVERSION;  Surgeon: Minus Breeding, MD;  Location: [place];  Service: Cardiovascular;  Laterality: N/A;   CARDIOVERSION N/A 12/13/2017   Procedure: CARDIOVERSION;  Surgeon: Thayer Headings, MD;  Location: [place];  Service: Cardiovascular;  Laterality: N/A;   CARDIOVERSION N/A 01/30/2018   Procedure: CARDIOVERSION;  Surgeon: Fay Records, MD;  Location: [place];  Service: Cardiovascular;  Laterality: N/A;   CARDIOVERSION N/A 06/27/2018   Procedure: CARDIOVERSION;  Surgeon: Buford Dresser, MD;  Location: [facility];  Service: Cardiovascular;  Laterality: N/A;   CARDIOVERSION N/A 08/16/2018   Procedure: CARDIOVERSION;  Surgeon: [name], MD;  Location: [place];  Service: Cardiovascular;  Laterality: N/A;   CARDIOVERSION N/A 08/28/2018   Procedure: CARDIOVERSION;  Surgeon: Acie Fredrickson Wonda Cheng, MD;  Location: [place];  Service: Cardiovascular;  Laterality: N/A;   COLONOSCOPY     EYE SURGERY Bilateral 2019   cataracts removed   TOTAL HIP ARTHROPLASTY Left 12/12/2018   Procedure: LEFT TOTAL HIP ARTHROPLASTY ANTERIOR APPROACH;  Surgeon: Mcarthur Rossetti, MD;  Location: [place];  Service: Orthopedics;  Laterality: Left;   WISDOM TOOTH EXTRACTION       Current Outpatient Medications  Medication Sig Dispense  Refill   acetaminophen (TYLENOL) 500 MG tablet Take 1,000 mg by mouth every 6 (six) hours as needed (pain).     calcium carbonate (TUMS - DOSED IN MG ELEMENTAL CALCIUM) 500 MG chewable tablet Chew 1-2 tablets by mouth 2 (two) times daily as needed for indigestion or heartburn.     cyclobenzaprine (FLEXERIL) 10 MG tablet Take 1  tablet (10 mg total) by mouth 2 (two) times daily as needed for muscle spasms. 20 tablet 0   diltiazem (CARDIZEM CD) 180 MG 24 hr capsule TAKE ONE CAPSULE BY MOUTH IN THE EVENING 90 capsule 2   dofetilide (TIKOSYN) 500 MCG capsule TAKE ONE CAPSULE BY MOUTH TWICE DAILY 180 capsule 2   ELIQUIS 5 MG TABS tablet TAKE ONE TABLET BY MOUTH TWICE DAILY 60 tablet 6   famotidine (PEPCID) 20 MG tablet Take 20 mg by mouth daily as needed for heartburn or indigestion.     gabapentin (NEURONTIN) 100 MG capsule Take 100 mg by mouth daily.     metoprolol succinate (TOPROL-XL) 25 MG 24 hr tablet Take 0.5 tablets (12.5 mg total) by mouth daily. 135 tablet 3   Multiple Vitamins-Minerals (MULTIVITAMIN ADULTS 50+ PO) Take by mouth.     Omega-3 Fatty Acids (FISH OIL) 1200 MG CAPS Take 1,200 mg by mouth daily.     rosuvastatin (CRESTOR) 10 MG tablet Take 1 tablet (10 mg total) by mouth daily. 90 tablet 3   sildenafil (VIAGRA) 50 MG tablet Take 25 mg by mouth daily as needed for erectile dysfunction.     simethicone (MYLICON) 80 MG chewable tablet Chew 80-160 mg by mouth every 6 (six) hours as needed for flatulence.     valACYclovir (VALTREX) 1000 MG tablet Take 2,000 mg by mouth every 12 (twelve) hours as needed (cold sore).      zolpidem (AMBIEN) 10 MG tablet Take 5 mg by mouth at bedtime as needed for sleep.     Current Facility-Administered Medications  Medication Dose Route Frequency Provider Last Rate Last Admin   0.9 %  sodium chloride infusion   Intravenous PRN [name], MD        Allergies:   Penicillins   Social History:  The patient  reports that he quit smoking about 19 years ago. His smoking use included cigarettes. He has a 3.50 pack-year smoking history. He has never used smokeless tobacco. He reports current alcohol use of about 3.0 - 4.0 standard drinks of alcohol per week. He reports that he does not use drugs.   Family History:  The patient's family history includes Heart attack in his  father; Hypertension in his father and mother.    ROS:  Please see the history of present illness.   Otherwise, review of systems is positive for none.   All other systems are reviewed and negative.    PHYSICAL EXAM: VS:  BP 120/60 (BP Location: Left Arm, Patient Position: Sitting, Cuff Size: Normal)   Pulse (!) 55   Ht 6' (1.829 m)   Wt 200 lb (90.7 kg)   SpO2 97%   BMI 27.12 kg/m  , BMI Body mass index is 27.12 kg/m. GEN: Well nourished, well developed, in no acute distress  HEENT: normal  Neck: no JVD, carotid bruits, or masses Cardiac: RRR; no murmurs, rubs, or gallops,no edema  Respiratory:  clear to auscultation bilaterally, normal work of breathing GI: soft, nontender, nondistended, + BS MS: no deformity or atrophy  Skin: warm and dry Neuro:  Strength and sensation are  intact Psych: euthymic mood, full affect  EKG:  EKG is not ordered today. Personal review of the ekg ordered 06/12/21 shows sinus rhythm, rate 52  Recent Labs: 06/11/2021: ALT 23; BUN 13; Creatinine, Ser 0.91; Hemoglobin 14.4; Magnesium 2.1; Platelets 163; Potassium 3.9; Sodium 135    Lipid Panel     Component Value Date/Time   CHOL 176 08/09/2010 0036   TRIG 234 (H) 08/09/2010 0036   HDL 46 08/09/2010 0036   CHOLHDL 3.8 08/09/2010 0036   VLDL 47 (H) 08/09/2010 0036   LDLCALC 83 08/09/2010 0036     Wt Readings from Last 3 Encounters:  07/07/21 200 lb (90.7 kg)  06/03/21 198 lb 12.8 oz (90.2 kg)  05/14/21 200 lb (90.7 kg)      Other studies Reviewed: Additional studies/ records that were reviewed today include: TTE 2019  Review of the above records today demonstrates: - Left ventricle: The cavity size was at the upper limits of    normal. Systolic function was normal. The estimated ejection    fraction was in the range of 55% to 60%. Wall motion was normal;    there were no regional wall motion abnormalities. The study was    not technically sufficient to allow evaluation of LV diastolic     dysfunction due to atrial fibrillation.  - Aortic valve: Poorly visualized. Trileaflet; normal thickness,    mildly calcified leaflets.  - Mitral valve: Calcified annulus. There was trivial regurgitation.  - Left atrium: The atrium was mildly dilated.  - Tricuspid valve: There was trivial regurgitation.   ASSESSMENT AND PLAN:  1.  Persistent atrial fibrillation/atrial flutter: CHA2DS2-VASc of 2.  Currently on Eliquis 5 mg twice daily, dofetilide 500 mcg twice daily.  High risk medication monitoring for dofetilide.  He is continued to have episodes of atrial fibrillation.  At this point, he would prefer repeat ablation.  Risk and benefits were discussed.  He understands the risks and is agreed to the procedure.  Risk, benefits, and alternatives to EP study and radiofrequency ablation for afib were also discussed in detail today. These risks include but are not limited to stroke, bleeding, vascular damage, tamponade, perforation, damage to the esophagus, lungs, and other structures, pulmonary vein stenosis, worsening renal function, and death. The patient understands these risk and wishes to proceed.  We Jaylan Duggar therefore proceed with catheter ablation at the next available time.  Carto, ICE, anesthesia are requested for the procedure.  Kalli Greenfield also obtain CT PV protocol prior to the procedure to exclude LAA thrombus and further evaluate atrial anatomy.  2.  Secondary hypercoagulable state: Currently on Eliquis for atrial fibrillation as above  3.  Hypertension: well controlled    Current medicines are reviewed at length with the patient today.   The patient does not have concerns regarding his medicines.  The following changes were made today:  none  Labs/ tests ordered today include:  Orders Placed This Encounter  Procedures   CT CARDIAC MORPH/PULM VEIN W/CM&W/O CA SCORE   Basic metabolic panel   CBC     Disposition:   FU with Orvell Careaga 3 months  Signed, Bessie Livingood Meredith Leeds, MD   07/07/2021 11:15 AM     [facility] [address] [place] [address] [phone] (office) [phone] (fax)

## 2021-07-15 DIAGNOSIS — K4090 Unilateral inguinal hernia, without obstruction or gangrene, not specified as recurrent: Secondary | ICD-10-CM | POA: Diagnosis not present

## 2021-07-21 DIAGNOSIS — I429 Cardiomyopathy, unspecified: Secondary | ICD-10-CM | POA: Diagnosis not present

## 2021-07-21 DIAGNOSIS — R059 Cough, unspecified: Secondary | ICD-10-CM | POA: Diagnosis not present

## 2021-07-21 DIAGNOSIS — I480 Paroxysmal atrial fibrillation: Secondary | ICD-10-CM | POA: Diagnosis not present

## 2021-07-27 ENCOUNTER — Telehealth: Payer: Self-pay

## 2021-07-27 ENCOUNTER — Ambulatory Visit: Payer: Self-pay | Admitting: Surgery

## 2021-07-27 DIAGNOSIS — K4090 Unilateral inguinal hernia, without obstruction or gangrene, not specified as recurrent: Secondary | ICD-10-CM | POA: Diagnosis not present

## 2021-07-27 DIAGNOSIS — I700 Atherosclerosis of aorta: Secondary | ICD-10-CM

## 2021-07-27 NOTE — Telephone Encounter (Signed)
   Pre-operative Risk Assessment    Patient Name: Gary [name]  DOB: 17-Aug-1950 MRN: [id number]      Request for Surgical Clearance    Procedure:   Hernia Surgery  Date of Surgery:  Clearance TBD                                 Surgeon:  Erroll Luna, MD Surgeon's Group or Practice Name:  [facility]  Phone number:  [phone] Fax number:  [phone] ATTN: Carlene Coria, CMA   Type of Clearance Requested:   - Medical  - Pharmacy:  Hold Apixaban (Eliquis)     Type of Anesthesia:  General    Additional requests/questions:   None  Signed, Lerone Onder   07/27/2021, 4:38 PM

## 2021-07-29 DIAGNOSIS — I700 Atherosclerosis of aorta: Secondary | ICD-10-CM | POA: Insufficient documentation

## 2021-07-29 NOTE — Telephone Encounter (Signed)
Patient with diagnosis of afib on Eliquis for anticoagulation.    Procedure: hernia surgery Date of procedure: TBD  CHA2DS2-VASc Score = 3  This indicates a 3.2% annual risk of stroke. The patient's score is based upon: CHF History: 0 HTN History: 1 Diabetes History: 0 Stroke History: 0 Vascular Disease History: 1 Age Score: 1 Gender Score: 0  Aortic atherosclerosis noted on lumbar CT 09/2019, PMH updated.  CrCl 36m/min Platelet count 163K  Per office protocol, patient can hold Eliquis for 2-3 days prior to procedure. Pt has upcoming afib ablation on 11/17/21 and will need to be on uninterrupted anticoagulation starting 3 weeks prior. Hernia surgery will need to be done before then.  **This guidance is not considered finalized until pre-operative APP has relayed final recommendations.**

## 2021-07-29 NOTE — Telephone Encounter (Signed)
I s/w the pt and after our conversation the pt said he wants to wait to hear back from the surgeon office as to when they feel they can do his hernia surgery. Pt states he has a wedding to attend in Sept and wants to be sure that he will be healed up in time. He also is scheduled for an ablation 11/17/21 with Dr. Curt Bears and possibly he may wait until after the ablation. We left it that the pt will call our office back and let us know what Dr. Brantley Stage office states about a tentative surgery date. Then we can set up a tele visit.

## 2021-07-29 NOTE — Telephone Encounter (Signed)
   Name: Gary [name]  DOB: 1950-03-22  MRN: [id number]  Primary Cardiologist: Larae Grooms, MD   Preoperative team, please contact this patient and set up a phone call appointment for further preoperative risk assessment. Please obtain consent and complete medication review. Thank you for your help.  I confirm that guidance regarding antiplatelet and oral anticoagulation therapy has been completed and, if necessary, noted below.  Patient with diagnosis of afib on Eliquis for anticoagulation.     Procedure: hernia surgery Date of procedure: TBD   CHA2DS2-VASc Score = 3  This indicates a 3.2% annual risk of stroke. The patient's score is based upon: CHF History: 0 HTN History: 1 Diabetes History: 0 Stroke History: 0 Vascular Disease History: 1 Age Score: 1 Gender Score: 0   Aortic atherosclerosis noted on lumbar CT 09/2019, PMH updated.   CrCl 24m/min Platelet count 163K   Per office protocol, patient can hold Eliquis for 2-3 days prior to procedure. Pt has upcoming afib ablation on 11/17/21 and will need to be on uninterrupted anticoagulation starting 3 weeks prior. Hernia surgery will need to be done before then.  ELenna Sciara NP 07/29/2021, 2:46 PM [id number]SLouiseGElber[address] [place] [address]

## 2021-08-10 ENCOUNTER — Encounter: Payer: Self-pay | Admitting: Cardiology

## 2021-08-10 ENCOUNTER — Telehealth: Payer: Self-pay | Admitting: *Deleted

## 2021-08-10 DIAGNOSIS — L570 Actinic keratosis: Secondary | ICD-10-CM | POA: Diagnosis not present

## 2021-08-10 DIAGNOSIS — X32XXXD Exposure to sunlight, subsequent encounter: Secondary | ICD-10-CM | POA: Diagnosis not present

## 2021-08-10 DIAGNOSIS — C44319 Basal cell carcinoma of skin of other parts of face: Secondary | ICD-10-CM | POA: Diagnosis not present

## 2021-08-10 DIAGNOSIS — D225 Melanocytic nevi of trunk: Secondary | ICD-10-CM | POA: Diagnosis not present

## 2021-08-10 NOTE — Telephone Encounter (Signed)
I called [facility] and s/w Abigail Butts in regard to that the pt is sent a MY CHART message stating [facility] has not heard back from cardiology in order so that they may schedule his surgery with them. I informed Abigail Butts that I tried to set up a tele visit for preop assessment when I s/w the pt 07/29/21. Pt stated to me at that time that he wanted to s/w the surgeon office first and then he would call us back to schedule a tele visit for pre op clearance. I informed Abigail Butts that the pt sent back a MY CHART message that the surgeon office has not heard from cardiology yet. I explained to Abigail Butts that we have not sent anything yet at the pt has needs tele visit for pre op clearance. Abigail Butts said she will call pt and let him know he haas to schedule our appt before surgery date can be made. I assured Abigail Butts that once I have a tele visit date I will send their office an FYI to help them plan a tentative surgery date on their end.    Abigail Butts called me back and said she s/w the pt and he is agreement to set up tel appt first for pre op clearance.     I was able to s/w the pt today and he has been scheduled for a tele visit for pre op clearance 08/18/21 @ 9 am. Med rec and consent are done. I will update the requesting office as FYI pt has tele visit set for 08/18/21 @ 9 am.   Pt thanked me for the help today.

## 2021-08-10 NOTE — Telephone Encounter (Signed)
I was able to s/w the pt today and he has been scheduled for a tele visit for pre op clearance 08/18/21 @ 9 am. Med rec and consent are done. I will update the requesting office as FYI pt has tele visit set for 08/18/21 @ 9 am.   Pt thanked me for the help today.     Patient Consent for Virtual Visit        ISAY PERLEBERG has provided verbal consent on 08/10/2021 for a virtual visit (video or telephone).   CONSENT FOR VIRTUAL VISIT FOR:  [name]  By participating in this virtual visit I agree to the following:  I hereby voluntarily request, consent and authorize [place] and its employed or contracted physicians, physician assistants, nurse practitioners or other licensed health care professionals (the Practitioner), to provide me with telemedicine health care services (the "Services") as deemed necessary by the treating Practitioner. I acknowledge and consent to receive the Services by the Practitioner via telemedicine. I understand that the telemedicine visit will involve communicating with the Practitioner through live audiovisual communication technology and the disclosure of certain medical information by electronic transmission. I acknowledge that I have been given the opportunity to request an in-person assessment or other available alternative prior to the telemedicine visit and am voluntarily participating in the telemedicine visit.  I understand that I have the right to withhold or withdraw my consent to the use of telemedicine in the course of my care at any time, without affecting my right to future care or treatment, and that the Practitioner or I may terminate the telemedicine visit at any time. I understand that I have the right to inspect all information obtained and/or recorded in the course of the telemedicine visit and may receive copies of available information for a reasonable fee.  I understand that some of the potential risks of receiving the Services via  telemedicine include:  Delay or interruption in medical evaluation due to technological equipment failure or disruption; Information transmitted may not be sufficient (e.g. poor resolution of images) to allow for appropriate medical decision making by the Practitioner; and/or  In rare instances, security protocols could fail, causing a breach of personal health information.  Furthermore, I acknowledge that it is my responsibility to provide information about my medical history, conditions and care that is complete and accurate to the best of my ability. I acknowledge that Practitioner's advice, recommendations, and/or decision may be based on factors not within their control, such as incomplete or inaccurate data provided by me or distortions of diagnostic images or specimens that may result from electronic transmissions. I understand that the practice of medicine is not an exact science and that Practitioner makes no warranties or guarantees regarding treatment outcomes. I acknowledge that a copy of this consent can be made available to me via my patient portal ([facility]), or I can request a printed copy by calling the office of [facility].    I understand that my insurance will be billed for this visit.   I have read or had this consent read to me. I understand the contents of this consent, which adequately explains the benefits and risks of the Services being provided via telemedicine.  I have been provided ample opportunity to ask questions regarding this consent and the Services and have had my questions answered to my satisfaction. I give my informed consent for the services to be provided through the use of telemedicine in my medical care

## 2021-08-11 DIAGNOSIS — K5900 Constipation, unspecified: Secondary | ICD-10-CM | POA: Diagnosis not present

## 2021-08-11 DIAGNOSIS — L84 Corns and callosities: Secondary | ICD-10-CM | POA: Diagnosis not present

## 2021-08-11 DIAGNOSIS — G4700 Insomnia, unspecified: Secondary | ICD-10-CM | POA: Diagnosis not present

## 2021-08-12 ENCOUNTER — Ambulatory Visit: Payer: PPO | Admitting: Podiatry

## 2021-08-12 ENCOUNTER — Ambulatory Visit (INDEPENDENT_AMBULATORY_CARE_PROVIDER_SITE_OTHER): Payer: PPO

## 2021-08-12 ENCOUNTER — Encounter: Payer: Self-pay | Admitting: Podiatry

## 2021-08-12 ENCOUNTER — Other Ambulatory Visit: Payer: Self-pay | Admitting: Podiatry

## 2021-08-12 DIAGNOSIS — L84 Corns and callosities: Secondary | ICD-10-CM

## 2021-08-12 DIAGNOSIS — M79672 Pain in left foot: Secondary | ICD-10-CM | POA: Diagnosis not present

## 2021-08-12 DIAGNOSIS — M21621 Bunionette of right foot: Secondary | ICD-10-CM

## 2021-08-12 DIAGNOSIS — M779 Enthesopathy, unspecified: Secondary | ICD-10-CM

## 2021-08-12 DIAGNOSIS — M7751 Other enthesopathy of right foot: Secondary | ICD-10-CM | POA: Diagnosis not present

## 2021-08-12 DIAGNOSIS — M79671 Pain in right foot: Secondary | ICD-10-CM | POA: Diagnosis not present

## 2021-08-12 DIAGNOSIS — M21622 Bunionette of left foot: Secondary | ICD-10-CM | POA: Diagnosis not present

## 2021-08-12 MED ORDER — TRIAMCINOLONE ACETONIDE 10 MG/ML IJ SUSP: 10.0000 mg | Freq: Once | INTRAMUSCULAR | Status: AC

## 2021-08-12 NOTE — Progress Notes (Signed)
Subjective:   Patient ID: Gary [name], male   DOB: 71 y.o.   MRN: [id number]   HPI Patient presents with pain between the fourth and fifth digits on the right foot that has been chronic in nature and sore and also complains about pain underneath the fifth metatarsal head bilateral and has tried inserts to try to relieve pressure and they only help partially.  Patient does not smoke likes to be active   Review of Systems  All other systems reviewed and are negative.       Objective:  Physical Exam Vitals and nursing note reviewed.  Constitutional:      Appearance: He is well-developed.  Pulmonary:     Effort: Pulmonary effort is normal.  Musculoskeletal:        General: Normal range of motion.  Skin:    General: Skin is warm.  Neurological:     Mental Status: He is alert.     Neurovascular status found to be intact muscle strength was found to be adequate range of motion adequate.  Patient does have exquisite discomfort fourth interspace right and around the fourth MPJ with keratotic lesion between the 2 toes and has lesions underneath the fifth metatarsal head both feet which become painful with walking.  Patient has good digital perfusion well oriented x3     Assessment:  Inflammatory capsulitis fourth MPJ right with interspace lesion that is painful with lesions underneath the fifth metatarsal head bilateral with patient that needs to be active due to cardiac issues     Plan:  H&P all conditions reviewed and I did do a careful injection around the fourth MPJ right 3 mg Dexasone Kenalog 5 mg Xylocaine I debrided lesions of the fifth metatarsal head bilateral fourth and fifth digit right discussed the possibility for partial syndactylization arthroplasty and possible fifth met head resection long-term.  Also discussed orthotics to disperse weight off the fifth MPJ organ to hold off and see how he responds conservatively and today patient will be seen back again to recheck is  encouraged to call with questions concerns  X-rays do indicate there is some reactivity around the fifth digit right with enlargement of the head of the proximal phalanx no indications of arthritis stress fracture bilateral

## 2021-08-18 ENCOUNTER — Ambulatory Visit (INDEPENDENT_AMBULATORY_CARE_PROVIDER_SITE_OTHER): Payer: PPO | Admitting: Nurse Practitioner

## 2021-08-18 DIAGNOSIS — Z01810 Encounter for preprocedural cardiovascular examination: Secondary | ICD-10-CM

## 2021-08-18 NOTE — Progress Notes (Signed)
Virtual Visit via Telephone Note   Because of BIFF RUTIGLIANO co-morbid illnesses, he is at least at moderate risk for complications without adequate follow up.  This format is felt to be most appropriate for this patient at this time.  The patient did not have access to video technology/had technical difficulties with video requiring transitioning to audio format only (telephone).  All issues noted in this document were discussed and addressed.  No physical exam could be performed with this format.  Please refer to the patient's chart for his consent to telehealth for [facility].  Evaluation Performed:  Preoperative cardiovascular risk assessment _____________   Date:  08/18/2021   Patient ID:  Gary [name], Gary [name] 1950/03/25, MRN [id number] Patient Location:  Home Provider location:   Office  Primary Care Provider:  [name], MD Primary Cardiologist:  Larae Grooms, MD  Chief Complaint / Patient Profile   71 y.o. y/o male with a h/o persistent atrial fibrillation/atrial flutter, NICM, hypertension, hyperlipidemia, GERD, and fibromyalgia who is pending hernia surgery, date TBD, with Dr. Erroll Luna of [facility] and presents today for telephonic preoperative cardiovascular risk assessment.  Past Medical History    Past Medical History:  Diagnosis Date   Arthritis    Atrial flutter ([place]) 2007   s/p CTI ablation by Dr Lovena Le   Back pain    resolved per pt 12/06/18   Fibromyalgia    GERD (gastroesophageal reflux disease)    Hyperlipidemia    Hypertension    Insomnia    occasional per pt 12/06/18   NICM (nonischemic cardiomyopathy) ([place])    a. 2005 - tachy mediated. normalized after return of NSR.   Obesity    Paroxysmal atrial fibrillation (HCC)    Pneumonia    x 1   Spinal stenosis    Past Surgical History:  Procedure Laterality Date   ATRIAL FIBRILLATION ABLATION N/A 03/22/2018   Procedure: ATRIAL FIBRILLATION ABLATION;  Surgeon:  Thompson Grayer, MD;  Location: [facility];  Service: Cardiovascular;  Laterality: N/A;   atrial flutter ablation  06/10/2005   CTI ablation by Dr Lovena Le for atrial flutter   CARDIOVERSION N/A 07/20/2016   Procedure: CARDIOVERSION;  Surgeon: Minus Breeding, MD;  Location: [facility];  Service: Cardiovascular;  Laterality: N/A;   CARDIOVERSION N/A 12/13/2017   Procedure: CARDIOVERSION;  Surgeon: Thayer Headings, MD;  Location: [facility];  Service: Cardiovascular;  Laterality: N/A;   CARDIOVERSION N/A 01/30/2018   Procedure: CARDIOVERSION;  Surgeon: Fay Records, MD;  Location: [facility];  Service: Cardiovascular;  Laterality: N/A;   CARDIOVERSION N/A 06/27/2018   Procedure: CARDIOVERSION;  Surgeon: Buford Dresser, MD;  Location: [facility];  Service: Cardiovascular;  Laterality: N/A;   CARDIOVERSION N/A 08/16/2018   Procedure: CARDIOVERSION;  Surgeon: [name], MD;  Location: [facility];  Service: Cardiovascular;  Laterality: N/A;   CARDIOVERSION N/A 08/28/2018   Procedure: CARDIOVERSION;  Surgeon: Acie Fredrickson Wonda Cheng, MD;  Location: [facility];  Service: Cardiovascular;  Laterality: N/A;   COLONOSCOPY     EYE SURGERY Bilateral 2019   cataracts removed   TOTAL HIP ARTHROPLASTY Left 12/12/2018   Procedure: LEFT TOTAL HIP ARTHROPLASTY ANTERIOR APPROACH;  Surgeon: Mcarthur Rossetti, MD;  Location: [facility];  Service: Orthopedics;  Laterality: Left;   WISDOM TOOTH EXTRACTION      Allergies  Allergies  Allergen Reactions   Penicillins Rash    DID THE REACTION INVOLVE: Swelling of the face/tongue/throat, SOB, or low BP? No  Sudden or severe rash/hives, skin peeling, or the inside of the mouth or nose? Unknown Did it require medical treatment? Unknown When did it last happen?      Childhood Allergy If all above answers are "NO", may proceed with cephalosporin use.      History of Present Illness    Gary [name] is a 71 y.o. male who presents via  audio/video conferencing for a telehealth visit today.  Pt was last seen in cardiology clinic on 07/07/2021 by Dr. Curt Bears. At that time NAITHAN DELAGE was doing well.  He is scheduled for an ablation in October 2023. The patient is now pending procedure as outlined above. Since his last visit, he has done well from a cardiac standpoint. He denies chest pain, palpitations, dyspnea, pnd, orthopnea, n, v, dizziness, syncope, edema, weight gain, or early satiety. All other systems reviewed and are otherwise negative except as noted above.   Home Medications    Prior to Admission medications   Medication Sig Start Date End Date Taking? Authorizing Provider  acetaminophen (TYLENOL) 500 MG tablet Take 1,000 mg by mouth every 6 (six) hours as needed (pain).    [provider]  calcium carbonate (TUMS - DOSED IN MG ELEMENTAL CALCIUM) 500 MG chewable tablet Chew 1-2 tablets by mouth 2 (two) times daily as needed for indigestion or heartburn.    [provider]  cyclobenzaprine (FLEXERIL) 10 MG tablet Take 1 tablet (10 mg total) by mouth 2 (two) times daily as needed for muscle spasms. 10/13/19   Breck Coons, MD  diltiazem (CARDIZEM CD) 180 MG 24 hr capsule TAKE ONE CAPSULE BY MOUTH IN THE EVENING 06/29/21   Fenton, [place] R, PA  dofetilide (TIKOSYN) 500 MCG capsule TAKE ONE CAPSULE BY MOUTH TWICE DAILY 04/17/21   Jettie Booze, MD  ELIQUIS 5 MG TABS tablet TAKE ONE TABLET BY MOUTH TWICE DAILY 02/09/21   Jettie Booze, MD  famotidine (PEPCID) 20 MG tablet Take 20 mg by mouth daily as needed for heartburn or indigestion.    [provider]  gabapentin (NEURONTIN) 100 MG capsule Take 100 mg by mouth daily.    [provider]  metoprolol succinate (TOPROL-XL) 25 MG 24 hr tablet Take 0.5 tablets (12.5 mg total) by mouth daily. 06/03/21   Fenton, Clint R, PA  Multiple Vitamins-Minerals (MULTIVITAMIN ADULTS 50+ PO) Take by mouth.    [provider]  Omega-3 Fatty  Acids (FISH OIL) 1200 MG CAPS Take 1,200 mg by mouth daily.    [provider]  rosuvastatin (CRESTOR) 10 MG tablet Take 1 tablet (10 mg total) by mouth daily. 01/29/21   Jettie Booze, MD  sildenafil (VIAGRA) 50 MG tablet Take 25 mg by mouth daily as needed for erectile dysfunction.    [provider]  simethicone (MYLICON) 80 MG chewable tablet Chew 80-160 mg by mouth every 6 (six) hours as needed for flatulence.    [provider]  valACYclovir (VALTREX) 1000 MG tablet Take 2,000 mg by mouth every 12 (twelve) hours as needed (cold sore).  12/04/12   [provider]  zolpidem (AMBIEN) 10 MG tablet Take 5 mg by mouth at bedtime as needed for sleep.    [provider]    Physical Exam    Vital Signs:  JACKSTON OAXACA does not have vital signs available for review today.  Given telephonic nature of communication, physical exam is limited. AAOx3. NAD. Normal affect.  Speech and respirations are  unlabored.  Accessory Clinical Findings    None  Assessment & Plan    1.  Preoperative Cardiovascular Risk Assessment: According to the Revised Cardiac Risk Index (RCRI), his Perioperative Risk of Major Cardiac Event is (%): 0.4. His Functional Capacity in METs is: 7.01 according to the Duke Activity Status Index (DASI). Therefore, based on ACC/AHA guidelines, patient would be at acceptable risk for the planned procedure without further cardiovascular testing.  Patient with diagnosis of afib on Eliquis for anticoagulation.     Procedure: hernia surgery Date of procedure: TBD   CHA2DS2-VASc Score = 3  This indicates a 3.2% annual risk of stroke. The patient's score is based upon: CHF History: 0 HTN History: 1 Diabetes History: 0 Stroke History: 0 Vascular Disease History: 1 Age Score: 1 Gender Score: 0   Aortic atherosclerosis noted on lumbar CT 09/2019, PMH updated.   CrCl 59m/min Platelet count 163K   Per office protocol, patient can  hold Eliquis for 2-3 days prior to procedure. Pt has upcoming afib ablation on 11/17/21 and will need to be on uninterrupted anticoagulation starting 3 weeks prior. Hernia surgery will need to be done before then.  Please resume Eliquis as soon as possible postprocedure, at the discretion of the surgeon.    A copy of this note will be routed to requesting surgeon.  Time:   Today, I have spent 13 minutes with the patient with telehealth technology discussing medical history, symptoms, and management plan.     ELenna Sciara NP  08/18/2021, 9:19 AM

## 2021-09-03 ENCOUNTER — Other Ambulatory Visit: Payer: Self-pay | Admitting: Interventional Cardiology

## 2021-09-03 DIAGNOSIS — I4819 Other persistent atrial fibrillation: Secondary | ICD-10-CM

## 2021-09-03 NOTE — Telephone Encounter (Signed)
Eliquis '5mg'$  refill request received. Patient is 71 years old, weight-90.7kg, Crea-0.91 on 06/11/2021, Diagnosis-Afib, and last seen by Diona Browner, NP on 08/18/2021. Dose is appropriate based on dosing criteria. Will send in refill to requested pharmacy.

## 2021-09-04 NOTE — Telephone Encounter (Signed)
I will forward this call to the pre op provider to review. See notes in epic for clearance request.

## 2021-09-04 NOTE — Telephone Encounter (Signed)
   Michelle from Dr. Josetta Huddle office  is calling to f/u clearance. Pt is scheduled for ablation one week before pt's hernia surgery. She is calling what will be the action needed for the pt's blood thinner. She requested if someone can call her back today

## 2021-09-08 NOTE — Telephone Encounter (Signed)
   Patient Name: Gary [name]  DOB: 09/13/1950 MRN: [id number]  Primary Cardiologist: Larae Grooms, MD  Chart reviewed as part of pre-operative protocol coverage.   Regarding anticoagulation therapy and upcoming hernia surgery, per Dr. Curt Bears:  "Patient should not be off of his anticoagulation after the CT scan pre-A-fib ablation. He will need uninterrupted anticoagulation for the 3 months post A-fib ablation.  If surgery can meet the above parameters, would hold Eliquis for 2 to 3 days prior to surgery as outlined below."  I will route this recommendation to the requesting party via [facility] fax function and remove from pre-op pool.  Please call with questions.  Lenna Sciara, NP 09/08/2021, 12:09 PM

## 2021-09-17 ENCOUNTER — Encounter: Payer: Self-pay | Admitting: Cardiology

## 2021-09-17 NOTE — Telephone Encounter (Signed)
Called pt to discuss. Pt is going to further discuss hernia repair w/ MD and see if hernia vs ablation procedure will need to be rescheduled. Aware that if ablation needs to be rescheduled then it would January as we are filled the rest of this year. Pt will let me know within next few weeks on what decision will be. (Jan 11 held in case needs to be rescheduled) Pt appreciates my call.

## 2021-09-23 ENCOUNTER — Encounter: Payer: Self-pay | Admitting: Podiatry

## 2021-09-23 ENCOUNTER — Ambulatory Visit: Payer: PPO | Admitting: Podiatry

## 2021-09-23 DIAGNOSIS — M21622 Bunionette of left foot: Secondary | ICD-10-CM | POA: Diagnosis not present

## 2021-09-23 DIAGNOSIS — L84 Corns and callosities: Secondary | ICD-10-CM

## 2021-09-23 DIAGNOSIS — M21621 Bunionette of right foot: Secondary | ICD-10-CM | POA: Diagnosis not present

## 2021-09-23 DIAGNOSIS — M2041 Other hammer toe(s) (acquired), right foot: Secondary | ICD-10-CM

## 2021-09-23 NOTE — Progress Notes (Signed)
Subjective:   Patient ID: Gary [name], male   DOB: 71 y.o.   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  He wants to do this scheduled for outpatient surgery all questions answered today and I reviewed with him the recovery of partial syndactyly arthroplasty procedure

## 2021-09-30 ENCOUNTER — Telehealth: Payer: Self-pay | Admitting: Cardiology

## 2021-09-30 DIAGNOSIS — I4891 Unspecified atrial fibrillation: Secondary | ICD-10-CM | POA: Diagnosis not present

## 2021-09-30 DIAGNOSIS — R194 Change in bowel habit: Secondary | ICD-10-CM | POA: Diagnosis not present

## 2021-09-30 DIAGNOSIS — R7301 Impaired fasting glucose: Secondary | ICD-10-CM | POA: Diagnosis not present

## 2021-09-30 DIAGNOSIS — I119 Hypertensive heart disease without heart failure: Secondary | ICD-10-CM | POA: Diagnosis not present

## 2021-09-30 DIAGNOSIS — F411 Generalized anxiety disorder: Secondary | ICD-10-CM | POA: Diagnosis not present

## 2021-09-30 DIAGNOSIS — K4090 Unilateral inguinal hernia, without obstruction or gangrene, not specified as recurrent: Secondary | ICD-10-CM | POA: Diagnosis not present

## 2021-09-30 DIAGNOSIS — D6869 Other thrombophilia: Secondary | ICD-10-CM | POA: Diagnosis not present

## 2021-09-30 DIAGNOSIS — G4700 Insomnia, unspecified: Secondary | ICD-10-CM | POA: Diagnosis not present

## 2021-09-30 NOTE — Telephone Encounter (Signed)
Pt is requesting call back in regards to his upcoming procedure in regards to dates and times.

## 2021-09-30 NOTE — Telephone Encounter (Signed)
Pt would like to r/s ablation to next year. Procedure moved to 02/04/2022. Aware I will be in touch to go over instructions.

## 2021-10-02 ENCOUNTER — Ambulatory Visit: Payer: PPO | Admitting: Podiatry

## 2021-10-02 DIAGNOSIS — L539 Erythematous condition, unspecified: Secondary | ICD-10-CM | POA: Diagnosis not present

## 2021-10-02 DIAGNOSIS — M2041 Other hammer toe(s) (acquired), right foot: Secondary | ICD-10-CM

## 2021-10-02 MED ORDER — DOXYCYCLINE HYCLATE 100 MG PO TABS: 100.0000 mg | ORAL_TABLET | Freq: Two times a day (BID) | ORAL | 0 refills | Status: DC

## 2021-10-02 NOTE — Progress Notes (Signed)
Subjective:  Patient ID: Gary [name], male    DOB: 1950-12-28,  MRN: [id number]  Chief Complaint  Patient presents with   Toe Pain    71 y.o. male presents with the above complaint.  Patient presents with complaint of right fourth heloma molle with fifth digit noted.  Patient is known to Dr. Paulla Dolly who has him scheduled for surgery in December to fix the fourth and fifth.  He states that it started causing some pain and there is some redness around it.  He wanted to get it eval make sure is not infected.  He denies any other acute complaints.   Review of Systems: Negative except as noted in the HPI. Denies N/V/F/Ch.  Past Medical History:  Diagnosis Date   Arthritis    Atrial flutter ([place]) 2007   s/p CTI ablation by Dr Lovena Le   Back pain    resolved per pt 12/06/18   Fibromyalgia    GERD (gastroesophageal reflux disease)    Hyperlipidemia    Hypertension    Insomnia    occasional per pt 12/06/18   NICM (nonischemic cardiomyopathy) ([place])    a. 2005 - tachy mediated. normalized after return of NSR.   Obesity    Paroxysmal atrial fibrillation (HCC)    Pneumonia    x 1   Spinal stenosis     Current Outpatient Medications:    doxycycline (VIBRA-TABS) 100 MG tablet, Take 1 tablet (100 mg total) by mouth 2 (two) times daily., Disp: 20 tablet, Rfl: 0   acetaminophen (TYLENOL) 500 MG tablet, Take 1,000 mg by mouth every 6 (six) hours as needed (pain)., Disp: , Rfl:    calcium carbonate (TUMS - DOSED IN MG ELEMENTAL CALCIUM) 500 MG chewable tablet, Chew 1-2 tablets by mouth 2 (two) times daily as needed for indigestion or heartburn., Disp: , Rfl:    cyclobenzaprine (FLEXERIL) 10 MG tablet, Take 1 tablet (10 mg total) by mouth 2 (two) times daily as needed for muscle spasms., Disp: 20 tablet, Rfl: 0   diltiazem (CARDIZEM CD) 180 MG 24 hr capsule, TAKE ONE CAPSULE BY MOUTH IN THE EVENING, Disp: 90 capsule, Rfl: 2   dofetilide (TIKOSYN) 500 MCG capsule, TAKE ONE CAPSULE BY MOUTH TWICE  DAILY, Disp: 180 capsule, Rfl: 2   ELIQUIS 5 MG TABS tablet, TAKE ONE TABLET BY MOUTH TWICE DAILY, Disp: 60 tablet, Rfl: 6   famotidine (PEPCID) 20 MG tablet, Take 20 mg by mouth daily as needed for heartburn or indigestion., Disp: , Rfl:    gabapentin (NEURONTIN) 100 MG capsule, Take 100 mg by mouth daily., Disp: , Rfl:    metoprolol succinate (TOPROL-XL) 25 MG 24 hr tablet, Take 0.5 tablets (12.5 mg total) by mouth daily., Disp: 135 tablet, Rfl: 3   Multiple Vitamins-Minerals (MULTIVITAMIN ADULTS 50+ PO), Take by mouth., Disp: , Rfl:    Omega-3 Fatty Acids (FISH OIL) 1200 MG CAPS, Take 1,200 mg by mouth daily., Disp: , Rfl:    rosuvastatin (CRESTOR) 10 MG tablet, Take 1 tablet (10 mg total) by mouth daily., Disp: 90 tablet, Rfl: 3   sildenafil (VIAGRA) 50 MG tablet, Take 25 mg by mouth daily as needed for erectile dysfunction., Disp: , Rfl:    simethicone (MYLICON) 80 MG chewable tablet, Chew 80-160 mg by mouth every 6 (six) hours as needed for flatulence., Disp: , Rfl:    Suvorexant (BELSOMRA) 20 MG TABS, 1 tablet at bedtime as needed, Disp: , Rfl:    valACYclovir (VALTREX) 1000 MG tablet,  Take 2,000 mg by mouth every 12 (twelve) hours as needed (cold sore). , Disp: , Rfl:    zolpidem (AMBIEN) 10 MG tablet, Take 5 mg by mouth at bedtime as needed for sleep., Disp: , Rfl:   Current Facility-Administered Medications:    0.9 %  sodium chloride infusion, , Intravenous, PRN, [name], MD   triamcinolone acetonide (KENALOG) 10 MG/ML injection 10 mg, 10 mg, Other, Once, Regal, Tamala Fothergill, DPM  Social History   Tobacco Use  Smoking Status Former   Packs/day: 0.50   Years: 7.00   Total pack years: 3.50   Types: Cigarettes   Quit date: 2004   Years since quitting: 19.7  Smokeless Tobacco Never  Tobacco Comments   Former smoker 06/03/21    Allergies  Allergen Reactions   Penicillins Rash    DID THE REACTION INVOLVE: Swelling of the face/tongue/throat, SOB, or low BP? No Sudden or  severe rash/hives, skin peeling, or the inside of the mouth or nose? Unknown Did it require medical treatment? Unknown When did it last happen?      Childhood Allergy If all above answers are "NO", may proceed with cephalosporin use.     Objective:  There were no vitals filed for this visit. There is no height or weight on file to calculate BMI. Constitutional Well developed. Well nourished.  Vascular Dorsalis pedis pulses palpable bilaterally. Posterior tibial pulses palpable bilaterally. Capillary refill normal to all digits.  No cyanosis or clubbing noted. Pedal hair growth normal.  Neurologic Normal speech. Oriented to person, place, and time. Epicritic sensation to light touch grossly present bilaterally.  Dermatologic Hyperkeratotic lesion with heloma molle noted between the right fourth and fifth.  Mild pain on palpation.  Orthopedic: Normal joint ROM without pain or crepitus bilaterally. No visible deformities. No bony tenderness.   Radiographs: None Assessment:  No diagnosis found. Plan:  Patient was evaluated and treated and all questions answered.  Right fourth fifth heloma molle with underlying friction blister -All questions and concerns were discussed with the patient in extensive detail.  At this time there is mild erythema I believe patient would benefit from doxycycline doxycycline was sent to the pharmacy.  He is scheduled to undergo surgery with Dr. Paulla Dolly.  If there is no improvement I have asked him to come back and see me or Dr. Paulla Dolly.  He states understanding -No blood wounds or lesion noted  No follow-ups on file.

## 2021-10-14 ENCOUNTER — Ambulatory Visit: Payer: PPO | Admitting: Neurology

## 2021-10-14 ENCOUNTER — Encounter: Payer: Self-pay | Admitting: Neurology

## 2021-10-14 VITALS — BP 143/60 | HR 52 | Ht 72.0 in | Wt 198.0 lb

## 2021-10-14 DIAGNOSIS — I480 Paroxysmal atrial fibrillation: Secondary | ICD-10-CM | POA: Diagnosis not present

## 2021-10-14 DIAGNOSIS — I4892 Unspecified atrial flutter: Secondary | ICD-10-CM

## 2021-10-14 DIAGNOSIS — F5104 Psychophysiologic insomnia: Secondary | ICD-10-CM

## 2021-10-14 NOTE — Progress Notes (Signed)
SLEEP MEDICINE CLINIC    Provider:  Larey Seat, MD  Primary Care Physician:  [name], MD [address] [place] [id number]     Referring Provider: [name], Md [address] [place],  [place] [address]          Chief Complaint according to patient   Patient presents with:     New Patient (Initial Visit)      Patient with insomnia- and recurrent atrial fibrillation- had a HST sleep study this year, 02-2021 no apnea was found. Failed cardioversion and ablation - cardio-drug Tikosyn interferes with other meds- PCP wanted to try meds to treat insomnia. Remote history of polycythemia. Used to smoke.        HISTORY OF PRESENT ILLNESS:   10-14-2021 Gary [name] is a 71 y.o. year old White or Caucasian male patient seen here as a referral on 10/14/2021 from PCP Brigitte Pulse for a second [place] for INSOMNIA> .  Chief concern according to patient :  " I have been worked up for organic sleep disorders, remaining problem is to stay asleep-  I went through CBT online 5 6 weeks. He keeps a sleep journal, he falls asleep promptly-  "I have the urge to urinate when I wake up for any reason. Sleep intervals of 2 hours, sometimes with hours of wakefulness in between -especially at 4 AM-  he reports leaving the bedroom and reading in another room-  total sleep time     Gary [name]   has a past medical history of Arthritis, Atrial flutter ([place]) (2007), Back pain, Fibromyalgia, GERD (gastroesophageal reflux disease), Hyperlipidemia, Hypertension, Insomnia, NICM (nonischemic cardiomyopathy) ([place]), Obesity, Paroxysmal atrial fibrillation ([place]), Pneumonia, and Spinal stenosis.   The patient an apnea link sleep study in 2-21- 2023  with a result of an AHI ( Apnea Hypopnea index)  of 3.7/h,  Sleep relevant medical history: Nocturia, Insomnia. Atrial fibrillation. Inguinal hernia, Hoarseness since ablation. Spinal stenosis does not affect sleep.   Family medical  /sleep history: No other family member on CPAP with OSA, insomnia, sleep walkers.    Social history:  Patient is  retired from [employer], pensions, Insurance underwriter-  and lives in a household with spouse, no pats. Tobacco use- quit 1998.   ETOH use ; moderate- cut down four years ago- , now 3-4 drinks  a week  max.  Caffeine intake in form of Coffee( /) Soda( /) Tea ( /) nor energy drinks. Regular exercise: walking 30 minutes a day. bike.   Hobbies :ko[employer] pond.       Sleep habits are as follows: The patient's dinner time is between 6-7 PM. The patient goes to bed at 11 PM and continues to sleep for intervals of 2 hours, wakes for unknown reasons, and takes  bathroom breaks, the first time at 1-2 AM.   The preferred sleep position is laterally , with the support of 1 pillow on a flat bed.  Dreams are reportedly rare.  7.30  AM is the usual rise time. The patient wakes up spontaneously at 7   He reports not feeling refreshed or restored in AM, without symptoms such as dry mouth, no morning headaches, and residual fatigue.  Naps are taken frequently after lunch , lasting from 15 to 25 minutes - these feel refreshing .    Review of Systems: Out of a complete 14 system review, the patient complains of only the following symptoms, and all other  reviewed systems are negative.:  Fatigue, sleepiness , snoring, fragmented sleep, Insomnia - fragmented sleep    How likely are you to doze in the following situations: 0 = not likely, 1 = slight chance, 2 = moderate chance, 3 = high chance   Sitting and Reading? Watching Television? Sitting inactive in a public place (theater or meeting)? As a passenger in a car for an hour without a break? Lying down in the afternoon when circumstances permit? Sitting and talking to someone? Sitting quietly after lunch without alcohol? In a car, while stopped for a few minutes in traffic?   Total = 12/ 24 points   FSS endorsed at 48/ 63 points.  GDS :  7 /15 elevated    Social History   Socioeconomic History   Marital status: Married    Spouse name: Not on file   Number of children: Not on file   Years of education: Not on file   Highest education level: Not on file  Occupational History   Not on file  Tobacco Use   Smoking status: Former    Packs/day: 0.50    Years: 7.00    Total pack years: 3.50    Types: Cigarettes    Quit date: 2004    Years since quitting: 19.7   Smokeless tobacco: Never   Tobacco comments:    Former smoker 06/03/21  Vaping Use   Vaping Use: Never used  Substance and Sexual Activity   Alcohol use: Yes    Alcohol/week: 3.0 - 4.0 standard drinks of alcohol    Types: 3 - 4 Glasses of wine per week    Comment: since 2020 cut back to 1-2 drinks per week   Drug use: No   Sexual activity: Yes  Other Topics Concern   Not on file  Social History Narrative   Lives in [place]   Retired from [employer] Strain: Not on file  Food Insecurity: Not on file  Transportation Needs: Not on file  Physical Activity: Not on file  Stress: Not on file  Social Connections: Not on file    Family History  Problem Relation Age of Onset   Heart attack Father    Hypertension Father    Hypertension Mother     Past Medical History:  Diagnosis Date   Arthritis    Atrial flutter ([place]) 2007   s/p CTI ablation by Dr Lovena Le   Back pain    resolved per pt 12/06/18       GERD (gastroesophageal reflux disease)    Hyperlipidemia    Hypertension    Insomnia, chronic since 2021    occasional per pt 12/06/18   NICM (nonischemic cardiomyopathy) ([facility])    a. 2005 - tachy mediated. normalized after return of NSR.   Obesity    Paroxysmal atrial fibrillation ([facility]) failed cardioversion, failed ablation. 2022    Pneumonia, once       Spinal stenosis     Past Surgical History:  Procedure Laterality Date   ATRIAL FIBRILLATION ABLATION N/A 03/22/2018   Procedure: ATRIAL FIBRILLATION  ABLATION;  Surgeon: Thompson Grayer, MD;  Location: [facility];  Service: Cardiovascular;  Laterality: N/A;   atrial flutter ablation  06/10/2005   CTI ablation by Dr Lovena Le for atrial flutter   CARDIOVERSION N/A 07/20/2016   Procedure: CARDIOVERSION;  Surgeon: Minus Breeding, MD;  Location: [facility];  Service: Cardiovascular;  Laterality: N/A;   CARDIOVERSION N/A 12/13/2017  Procedure: CARDIOVERSION;  Surgeon: Thayer Headings, MD;  Location: [facility];  Service: Cardiovascular;  Laterality: N/A;   CARDIOVERSION N/A 01/30/2018   Procedure: CARDIOVERSION;  Surgeon: Fay Records, MD;  Location: [facility];  Service: Cardiovascular;  Laterality: N/A;   CARDIOVERSION N/A 06/27/2018   Procedure: CARDIOVERSION;  Surgeon: Buford Dresser, MD;  Location: [facility];  Service: Cardiovascular;  Laterality: N/A;   CARDIOVERSION N/A 08/16/2018   Procedure: CARDIOVERSION;  Surgeon: [name], MD;  Location: [facility];  Service: Cardiovascular;  Laterality: N/A;   CARDIOVERSION N/A 08/28/2018   Procedure: CARDIOVERSION;  Surgeon: Acie Fredrickson Wonda Cheng, MD;  Location: [facility];  Service: Cardiovascular;  Laterality: N/A;   COLONOSCOPY     EYE SURGERY Bilateral 2019   cataracts removed   TOTAL HIP ARTHROPLASTY Left 12/12/2018   Procedure: LEFT TOTAL HIP ARTHROPLASTY ANTERIOR APPROACH;  Surgeon: Mcarthur Rossetti, MD;  Location: [facility];  Service: Orthopedics;  Laterality: Left;   WISDOM TOOTH EXTRACTION       Current Outpatient Medications on File Prior to Visit  Medication Sig Dispense Refill   acetaminophen (TYLENOL) 500 MG tablet Take 1,000 mg by mouth every 6 (six) hours as needed (pain).     calcium carbonate (TUMS - DOSED IN MG ELEMENTAL CALCIUM) 500 MG chewable tablet Chew 1-2 tablets by mouth 2 (two) times daily as needed for indigestion or heartburn.     cyclobenzaprine (FLEXERIL) 10 MG tablet Take 1 tablet (10 mg total) by mouth 2 (two) times daily as needed for  muscle spasms. 20 tablet 0   diltiazem (CARDIZEM CD) 180 MG 24 hr capsule TAKE ONE CAPSULE BY MOUTH IN THE EVENING 90 capsule 2   dofetilide (TIKOSYN) 500 MCG capsule TAKE ONE CAPSULE BY MOUTH TWICE DAILY 180 capsule 2   ELIQUIS 5 MG TABS tablet TAKE ONE TABLET BY MOUTH TWICE DAILY 60 tablet 6   famotidine (PEPCID) 20 MG tablet Take 20 mg by mouth daily as needed for heartburn or indigestion.     metoprolol succinate (TOPROL-XL) 25 MG 24 hr tablet Take 0.5 tablets (12.5 mg total) by mouth daily. 135 tablet 3   Multiple Vitamins-Minerals (MULTIVITAMIN ADULTS 50+ PO) Take by mouth.     Omega-3 Fatty Acids (FISH OIL) 1200 MG CAPS Take 1,200 mg by mouth daily.     rosuvastatin (CRESTOR) 10 MG tablet Take 1 tablet (10 mg total) by mouth daily. 90 tablet 3   sildenafil (VIAGRA) 50 MG tablet Take 25 mg by mouth daily as needed for erectile dysfunction.     simethicone (MYLICON) 80 MG chewable tablet Chew 80-160 mg by mouth every 6 (six) hours as needed for flatulence.     Suvorexant (BELSOMRA) 20 MG TABS 1 tablet at bedtime as needed     valACYclovir (VALTREX) 1000 MG tablet Take 2,000 mg by mouth every 12 (twelve) hours as needed (cold sore).      zolpidem (AMBIEN) 10 MG tablet Take 5 mg by mouth at bedtime as needed for sleep.     doxycycline (VIBRA-TABS) 100 MG tablet Take 1 tablet (100 mg total) by mouth 2 (two) times daily. (Patient not taking: Reported on 10/14/2021) 20 tablet 0   gabapentin (NEURONTIN) 100 MG capsule Take 100 mg by mouth daily. (Patient not taking: Reported on 10/14/2021)     Current Facility-Administered Medications on File Prior to Visit  Medication Dose Route Frequency Provider Last Rate Last Admin   0.9 %  sodium chloride infusion   Intravenous PRN Brigitte Pulse,  Kimberlee, MD       triamcinolone acetonide (KENALOG) 10 MG/ML injection 10 mg  10 mg Other Once Regal, Norman S, DPM        Allergies  Allergen Reactions   Penicillins Rash    DID THE REACTION INVOLVE: Swelling of the  face/tongue/throat, SOB, or low BP? No Sudden or severe rash/hives, skin peeling, or the inside of the mouth or nose? Unknown Did it require medical treatment? Unknown When did it last happen?      Childhood Allergy If all above answers are "NO", may proceed with cephalosporin use.      Physical exam:  Today's Vitals   10/14/21 1003  BP: (!) 143/60  Pulse: (!) 52  Weight: 198 lb (89.8 kg)  Height: 6' (1.829 m)   Body mass index is 26.85 kg/m.   Wt Readings from Last 3 Encounters:  10/14/21 198 lb (89.8 kg)  07/07/21 200 lb (90.7 kg)  06/03/21 198 lb 12.8 oz (90.2 kg)     Ht Readings from Last 3 Encounters:  10/14/21 6' (1.829 m)  07/07/21 6' (1.829 m)  06/03/21 6' (1.829 m)      General: The patient is awake, alert and appears not in acute distress. The patient is well groomed. Head: Normocephalic, atraumatic. Neck is supple.  Mallampati 2,  neck circumference:16 inches . Nasal airflow  patent.  Retrognathia is  seen.  Dental status: biological  Cardiovascular:  Regular rate and cardiac rhythm by pulse,  without distended neck veins. Respiratory: Lungs are clear to auscultation.  Skin:  Without evidence of ankle edema, or rash. Trunk: The patient's posture is erect.   Neurologic exam : The patient is awake and alert, oriented to place and time.   Memory subjective described as intact.  Attention span & concentration ability appears normal.  Speech is fluent  with dysphonia .  Mood and affect are appropriate.   Cranial nerves: no loss of smell or taste reported  Pupils are equal and briskly reactive to light. Funduscopic exam deferred..  Extraocular movements in vertical and horizontal planes were intact and without nystagmus. No Diplopia. Visual fields by finger perimetry are intact. Hearing was intact to soft voice and finger rubbing.    Facial sensation intact to fine touch.  Facial motor strength is symmetric and tongue and uvula move midline.  Neck ROM :  rotation, tilt and flexion extension were normal for age and shoulder shrug was symmetrical.    Motor exam:  Symmetric bulk, tone and ROM.   Normal tone without cog -wheeling, symmetric grip strength .   Sensory:  Fine touch and vibration were normal.  Proprioception tested in the upper extremities was normal.   Coordination: Rapid alternating movements in the fingers/hands were of normal speed.  The Finger-to-nose maneuver was intact without evidence of ataxia, dysmetria or tremor.   Gait and station: Patient could rise unassisted from a seated position, walked without assistive device.  Stance is of normal width/ base and the patient turned with 3 steps.  Toe and heel walk were deferred.  Deep tendon reflexes: in the  upper extremities are symmetric and intact.  Left patella is more brisk than left  Babinski response was deferred.       After spending a total time of  50  minutes face to face and additional time for physical and neurologic examination, review of laboratory studies,  personal review of imaging studies, reports and results of other testing and review of referral information /  records as far as provided in visit, I have established the following assessments:  1) chronic insomnia , difficulties to maintain sleep for longer than 2 hours, no dream related interruption. No pain , no known palpitations, no diaphoresis, no difficulties to go to sleep..  2) has failed many sleep aids .  Elavil, Ambien is used Prn, Belsomra, melatonin, valerian,  3) Ambien at doses as low as 2.5 mg has helped more than Belsomra.     My Plan is to proceed with:  1) attended sleep study - I like to know why he wakes up- physiological causes of sleep interruption. 2) he is not known to snore since he lost 30 pounds.  3)   I would like to thank [name], MD and [name], Md [address] [place],   [id number] for allowing me to provide a second OPINION.   In  short, Gary [name] is presenting with insomnia.  I plan to follow up either personally or through our NP within 2-4 months.   CC: I will share my notes with PCP .  Electronically signed by: Larey Seat, MD 10/14/2021 10:35 AM  [facility] Board certified by The [employer] of Sleep Medicine and Diplomate of the [employer] of Sleep Medicine. Board certified In Neurology through the [employer], Fellow of the [employer] of Neurology. Medical Director of [facility].

## 2021-10-14 NOTE — Patient Instructions (Signed)
Insomnia Insomnia is a sleep disorder that makes it difficult to fall asleep or stay asleep. Insomnia can cause fatigue, low energy, difficulty concentrating, mood swings, and poor performance at work or school. There are three different ways to classify insomnia: Difficulty falling asleep. Difficulty staying asleep. Waking up too early in the morning. Any type of insomnia can be long-term (chronic) or short-term (acute). Both are common. Short-term insomnia usually lasts for 3 months or less. Chronic insomnia occurs at least three times a week for longer than 3 months. What are the causes? Insomnia may be caused by another condition, situation, or substance, such as: Having certain mental health conditions, such as anxiety and depression. Using caffeine, alcohol, tobacco, or drugs. Having gastrointestinal conditions, such as gastroesophageal reflux disease (GERD). Having certain medical conditions. These include: Asthma. Alzheimer's disease. Stroke. Chronic pain. An overactive thyroid gland (hyperthyroidism). Other sleep disorders, such as restless legs syndrome and sleep apnea. Menopause. Sometimes, the cause of insomnia may not be known. What increases the risk? Risk factors for insomnia include: Gender. Females are affected more often than males. Age. Insomnia is more common as people get older. Stress and certain medical and mental health conditions. Lack of exercise. Having an irregular work schedule. This may include working night shifts and traveling between different time zones. What are the signs or symptoms? If you have insomnia, the main symptom is having trouble falling asleep or having trouble staying asleep. This may lead to other symptoms, such as: Feeling tired or having low energy. Feeling nervous about going to sleep. Not feeling rested in the morning. Having trouble concentrating. Feeling irritable, anxious, or depressed. How is this diagnosed? This condition  may be diagnosed based on: Your symptoms and medical history. Your health care provider may ask about: Your sleep habits. Any medical conditions you have. Your mental health. A physical exam. How is this treated? Treatment for insomnia depends on the cause. Treatment may focus on treating an underlying condition that is causing the insomnia. Treatment may also include: Medicines to help you sleep. Counseling or therapy. Lifestyle adjustments to help you sleep better. Follow these instructions at home: Eating and drinking  Limit or avoid alcohol, caffeinated beverages, and products that contain nicotine and tobacco, especially close to bedtime. These can disrupt your sleep. Do not eat a large meal or eat spicy foods right before bedtime. This can lead to digestive discomfort that can make it hard for you to sleep. Sleep habits  Keep a sleep diary to help you and your health care provider figure out what could be causing your insomnia. Write down: When you sleep. When you wake up during the night. How well you sleep and how rested you feel the next day. Any side effects of medicines you are taking. What you eat and drink. Make your bedroom a dark, comfortable place where it is easy to fall asleep. Put up shades or blackout curtains to block light from outside. Use a white noise machine to block noise. Keep the temperature cool. Limit screen use before bedtime. This includes: Not watching TV. Not using your smartphone, tablet, or computer. Stick to a routine that includes going to bed and waking up at the same times every day and night. This can help you fall asleep faster. Consider making a quiet activity, such as reading, part of your nighttime routine. Try to avoid taking naps during the day so that you sleep better at night. Get out of bed if you are still awake after   15 minutes of trying to sleep. Keep the lights down, but try reading or doing a quiet activity. When you feel  sleepy, go back to bed. General instructions Take over-the-counter and prescription medicines only as told by your health care provider. Exercise regularly as told by your health care provider. However, avoid exercising in the hours right before bedtime. Use relaxation techniques to manage stress. Ask your health care provider to suggest some techniques that may work well for you. These may include: Breathing exercises. Routines to release muscle tension. Visualizing peaceful scenes. Make sure that you drive carefully. Do not drive if you feel very sleepy. Keep all follow-up visits. This is important. Contact a health care provider if: You are tired throughout the day. You have trouble in your daily routine due to sleepiness. You continue to have sleep problems, or your sleep problems get worse. Get help right away if: You have thoughts about hurting yourself or someone else. Get help right away if you feel like you may hurt yourself or others, or have thoughts about taking your own life. Go to your nearest emergency room or: Call 911. Call the National Suicide Prevention Lifeline at 1-800-273-8255 or 988. This is open 24 hours a day. Text the Crisis Text Line at 741741. Summary Insomnia is a sleep disorder that makes it difficult to fall asleep or stay asleep. Insomnia can be long-term (chronic) or short-term (acute). Treatment for insomnia depends on the cause. Treatment may focus on treating an underlying condition that is causing the insomnia. Keep a sleep diary to help you and your health care provider figure out what could be causing your insomnia. This information is not intended to replace advice given to you by your health care provider. Make sure you discuss any questions you have with your health care provider. Document Revised: 12/22/2020 Document Reviewed: 12/22/2020 Elsevier Patient Education  2023 [employer] Inc.  

## 2021-10-15 ENCOUNTER — Encounter: Payer: Self-pay | Admitting: Cardiology

## 2021-10-15 ENCOUNTER — Telehealth: Payer: Self-pay | Admitting: Neurology

## 2021-10-15 NOTE — Telephone Encounter (Signed)
HTA pending faxed notes 

## 2021-10-21 ENCOUNTER — Encounter: Payer: Self-pay | Admitting: Podiatry

## 2021-10-21 ENCOUNTER — Ambulatory Visit: Payer: PPO | Admitting: Podiatry

## 2021-10-21 DIAGNOSIS — M2041 Other hammer toe(s) (acquired), right foot: Secondary | ICD-10-CM

## 2021-10-21 DIAGNOSIS — L84 Corns and callosities: Secondary | ICD-10-CM

## 2021-10-21 DIAGNOSIS — M21622 Bunionette of left foot: Secondary | ICD-10-CM

## 2021-10-21 DIAGNOSIS — M21621 Bunionette of right foot: Secondary | ICD-10-CM

## 2021-10-21 NOTE — Progress Notes (Signed)
Subjective:   Patient ID: Gary [name], male   DOB: 71 y.o.   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  He agrees with me he wants to have surgery he is due to have hernia surgery end of October and ablation in January so this is planned for the beginning of December and all questions answered he will see back the end of November where questions will be answered again

## 2021-11-03 DIAGNOSIS — U071 COVID-19: Secondary | ICD-10-CM | POA: Diagnosis not present

## 2021-11-04 DIAGNOSIS — K4090 Unilateral inguinal hernia, without obstruction or gangrene, not specified as recurrent: Secondary | ICD-10-CM | POA: Diagnosis not present

## 2021-11-04 DIAGNOSIS — M4800 Spinal stenosis, site unspecified: Secondary | ICD-10-CM | POA: Diagnosis not present

## 2021-11-04 DIAGNOSIS — I440 Atrioventricular block, first degree: Secondary | ICD-10-CM | POA: Diagnosis not present

## 2021-11-04 DIAGNOSIS — I480 Paroxysmal atrial fibrillation: Secondary | ICD-10-CM | POA: Diagnosis not present

## 2021-11-04 DIAGNOSIS — R0602 Shortness of breath: Secondary | ICD-10-CM | POA: Diagnosis not present

## 2021-11-04 DIAGNOSIS — U071 COVID-19: Secondary | ICD-10-CM | POA: Diagnosis not present

## 2021-11-04 DIAGNOSIS — R079 Chest pain, unspecified: Secondary | ICD-10-CM | POA: Diagnosis not present

## 2021-11-04 DIAGNOSIS — I10 Essential (primary) hypertension: Secondary | ICD-10-CM | POA: Diagnosis not present

## 2021-11-04 DIAGNOSIS — E785 Hyperlipidemia, unspecified: Secondary | ICD-10-CM | POA: Diagnosis not present

## 2021-11-04 DIAGNOSIS — Z7901 Long term (current) use of anticoagulants: Secondary | ICD-10-CM | POA: Diagnosis not present

## 2021-11-05 DIAGNOSIS — I4891 Unspecified atrial fibrillation: Secondary | ICD-10-CM | POA: Diagnosis not present

## 2021-11-05 DIAGNOSIS — U071 COVID-19: Secondary | ICD-10-CM | POA: Diagnosis not present

## 2021-11-05 DIAGNOSIS — I480 Paroxysmal atrial fibrillation: Secondary | ICD-10-CM | POA: Diagnosis not present

## 2021-11-05 DIAGNOSIS — E785 Hyperlipidemia, unspecified: Secondary | ICD-10-CM | POA: Diagnosis not present

## 2021-11-05 DIAGNOSIS — I10 Essential (primary) hypertension: Secondary | ICD-10-CM | POA: Diagnosis not present

## 2021-11-05 DIAGNOSIS — K4090 Unilateral inguinal hernia, without obstruction or gangrene, not specified as recurrent: Secondary | ICD-10-CM | POA: Diagnosis not present

## 2021-11-05 DIAGNOSIS — I440 Atrioventricular block, first degree: Secondary | ICD-10-CM | POA: Diagnosis not present

## 2021-11-05 DIAGNOSIS — R079 Chest pain, unspecified: Secondary | ICD-10-CM | POA: Diagnosis not present

## 2021-11-05 DIAGNOSIS — M4800 Spinal stenosis, site unspecified: Secondary | ICD-10-CM | POA: Diagnosis not present

## 2021-11-05 DIAGNOSIS — Z7901 Long term (current) use of anticoagulants: Secondary | ICD-10-CM | POA: Diagnosis not present

## 2021-11-05 DIAGNOSIS — R0602 Shortness of breath: Secondary | ICD-10-CM | POA: Diagnosis not present

## 2021-11-06 DIAGNOSIS — Z7901 Long term (current) use of anticoagulants: Secondary | ICD-10-CM | POA: Diagnosis not present

## 2021-11-06 DIAGNOSIS — R079 Chest pain, unspecified: Secondary | ICD-10-CM | POA: Diagnosis not present

## 2021-11-06 DIAGNOSIS — I4891 Unspecified atrial fibrillation: Secondary | ICD-10-CM | POA: Diagnosis not present

## 2021-11-06 DIAGNOSIS — I10 Essential (primary) hypertension: Secondary | ICD-10-CM | POA: Diagnosis not present

## 2021-11-09 ENCOUNTER — Other Ambulatory Visit: Payer: PPO

## 2021-11-10 ENCOUNTER — Other Ambulatory Visit (HOSPITAL_COMMUNITY): Payer: PPO

## 2021-11-16 ENCOUNTER — Encounter (HOSPITAL_BASED_OUTPATIENT_CLINIC_OR_DEPARTMENT_OTHER): Payer: Self-pay | Admitting: Surgery

## 2021-11-16 ENCOUNTER — Encounter: Payer: Self-pay | Admitting: Cardiology

## 2021-11-16 NOTE — Telephone Encounter (Signed)
NPSG- HTA auth: [id number] (exp. 10/15/21 to 01/14/22).  Patient is scheduled at [facility] for 12/20/21 at 8 pm.  Mailed packet to the patient.

## 2021-11-18 ENCOUNTER — Ambulatory Visit: Payer: Self-pay | Admitting: Surgery

## 2021-11-18 ENCOUNTER — Encounter: Payer: Self-pay | Admitting: Cardiology

## 2021-11-24 ENCOUNTER — Encounter (HOSPITAL_BASED_OUTPATIENT_CLINIC_OR_DEPARTMENT_OTHER): Payer: Self-pay | Admitting: Surgery

## 2021-11-24 ENCOUNTER — Encounter (HOSPITAL_BASED_OUTPATIENT_CLINIC_OR_DEPARTMENT_OTHER)
Admission: RE | Disposition: A | Discharge status: Home / Self Care | Payer: Self-pay | Source: Ambulatory Visit | Attending: Surgery

## 2021-11-24 ENCOUNTER — Ambulatory Visit (HOSPITAL_BASED_OUTPATIENT_CLINIC_OR_DEPARTMENT_OTHER): Payer: PPO | Admitting: Anesthesiology

## 2021-11-24 ENCOUNTER — Other Ambulatory Visit: Payer: Self-pay

## 2021-11-24 ENCOUNTER — Ambulatory Visit (HOSPITAL_BASED_OUTPATIENT_CLINIC_OR_DEPARTMENT_OTHER)
Admission: RE | Admit: 2021-11-24 | Discharge: 2021-11-24 | Disposition: A | Discharge status: Home / Self Care | Payer: PPO | Source: Ambulatory Visit | Attending: Surgery | Admitting: Surgery

## 2021-11-24 DIAGNOSIS — Z01818 Encounter for other preprocedural examination: Secondary | ICD-10-CM

## 2021-11-24 DIAGNOSIS — I10 Essential (primary) hypertension: Secondary | ICD-10-CM

## 2021-11-24 DIAGNOSIS — K219 Gastro-esophageal reflux disease without esophagitis: Secondary | ICD-10-CM | POA: Diagnosis not present

## 2021-11-24 DIAGNOSIS — I4891 Unspecified atrial fibrillation: Secondary | ICD-10-CM

## 2021-11-24 DIAGNOSIS — Z87891 Personal history of nicotine dependence: Secondary | ICD-10-CM | POA: Diagnosis not present

## 2021-11-24 DIAGNOSIS — K4090 Unilateral inguinal hernia, without obstruction or gangrene, not specified as recurrent: Secondary | ICD-10-CM

## 2021-11-24 DIAGNOSIS — G8918 Other acute postprocedural pain: Secondary | ICD-10-CM | POA: Diagnosis not present

## 2021-11-24 HISTORY — PX: INGUINAL HERNIA REPAIR: SHX194

## 2021-11-24 HISTORY — DX: Cardiac arrhythmia, unspecified: I49.9

## 2021-11-24 SURGERY — REPAIR, HERNIA, INGUINAL, ADULT
Anesthesia: General | Laterality: Right

## 2021-11-24 MED ORDER — MIDAZOLAM HCL 2 MG/2ML IJ SOLN
1.0000 mg | Freq: Once | INTRAMUSCULAR | Status: AC
  Administered 2021-11-24: 1 mg via INTRAVENOUS

## 2021-11-24 MED ORDER — ROPIVACAINE HCL 5 MG/ML IJ SOLN
INTRAMUSCULAR | Status: DC | PRN
  Administered 2021-11-24: 30 mL

## 2021-11-24 MED ORDER — ROCURONIUM BROMIDE 100 MG/10ML IV SOLN
INTRAVENOUS | Status: DC | PRN
  Administered 2021-11-24: 60 mg via INTRAVENOUS

## 2021-11-24 MED ORDER — KETOROLAC TROMETHAMINE 30 MG/ML IJ SOLN: 30.0000 mg | Freq: Once | INTRAMUSCULAR | Status: DC | PRN

## 2021-11-24 MED ORDER — CHLORHEXIDINE GLUCONATE CLOTH 2 % EX PADS: 6.0000 | MEDICATED_PAD | Freq: Once | CUTANEOUS | Status: DC

## 2021-11-24 MED ORDER — FENTANYL CITRATE (PF) 100 MCG/2ML IJ SOLN
INTRAMUSCULAR | Status: DC | PRN
  Administered 2021-11-24: 25 ug via INTRAVENOUS

## 2021-11-24 MED ORDER — FENTANYL CITRATE (PF) 100 MCG/2ML IJ SOLN
INTRAMUSCULAR | Status: AC
  Filled 2021-11-24: qty 2

## 2021-11-24 MED ORDER — LIDOCAINE HCL (CARDIAC) PF 100 MG/5ML IV SOSY
PREFILLED_SYRINGE | INTRAVENOUS | Status: DC | PRN
  Administered 2021-11-24: 30 mg via INTRAVENOUS

## 2021-11-24 MED ORDER — ONDANSETRON HCL 4 MG/2ML IJ SOLN
INTRAMUSCULAR | Status: DC | PRN
  Administered 2021-11-24: 4 mg via INTRAVENOUS

## 2021-11-24 MED ORDER — FENTANYL CITRATE (PF) 100 MCG/2ML IJ SOLN: 25.0000 ug | INTRAMUSCULAR | Status: DC | PRN

## 2021-11-24 MED ORDER — VANCOMYCIN HCL IN DEXTROSE 1-5 GM/200ML-% IV SOLN
INTRAVENOUS | Status: AC
  Filled 2021-11-24: qty 200

## 2021-11-24 MED ORDER — EPHEDRINE SULFATE (PRESSORS) 50 MG/ML IJ SOLN
INTRAMUSCULAR | Status: DC | PRN
  Administered 2021-11-24: 10 mg via INTRAVENOUS

## 2021-11-24 MED ORDER — VANCOMYCIN HCL IN DEXTROSE 1-5 GM/200ML-% IV SOLN
1000.0000 mg | INTRAVENOUS | Status: AC
  Administered 2021-11-24: 1000 mg via INTRAVENOUS

## 2021-11-24 MED ORDER — FENTANYL CITRATE (PF) 100 MCG/2ML IJ SOLN
50.0000 ug | Freq: Once | INTRAMUSCULAR | Status: AC
  Administered 2021-11-24: 50 ug via INTRAVENOUS

## 2021-11-24 MED ORDER — BUPIVACAINE-EPINEPHRINE (PF) 0.25% -1:200000 IJ SOLN
INTRAMUSCULAR | Status: AC
  Filled 2021-11-24: qty 60

## 2021-11-24 MED ORDER — OXYCODONE HCL 5 MG PO TABS: 5.0000 mg | ORAL_TABLET | Freq: Four times a day (QID) | ORAL | 0 refills | Status: DC | PRN

## 2021-11-24 MED ORDER — OXYCODONE HCL 5 MG PO TABS
5.0000 mg | ORAL_TABLET | Freq: Once | ORAL | Status: AC | PRN
  Administered 2021-11-24: 5 mg via ORAL

## 2021-11-24 MED ORDER — ONDANSETRON HCL 4 MG/2ML IJ SOLN: 4.0000 mg | Freq: Once | INTRAMUSCULAR | Status: DC | PRN

## 2021-11-24 MED ORDER — OXYCODONE HCL 5 MG/5ML PO SOLN: 5.0000 mg | Freq: Once | ORAL | Status: AC | PRN

## 2021-11-24 MED ORDER — PROPOFOL 500 MG/50ML IV EMUL
INTRAVENOUS | Status: DC | PRN
  Administered 2021-11-24: 25 ug/kg/min via INTRAVENOUS

## 2021-11-24 MED ORDER — BUPIVACAINE-EPINEPHRINE 0.25% -1:200000 IJ SOLN
INTRAMUSCULAR | Status: DC | PRN
  Administered 2021-11-24: 10 mL

## 2021-11-24 MED ORDER — PROPOFOL 10 MG/ML IV BOLUS
INTRAVENOUS | Status: DC | PRN
  Administered 2021-11-24: 100 mg via INTRAVENOUS

## 2021-11-24 MED ORDER — SUGAMMADEX SODIUM 200 MG/2ML IV SOLN
INTRAVENOUS | Status: DC | PRN
  Administered 2021-11-24: 200 mg via INTRAVENOUS

## 2021-11-24 MED ORDER — OXYCODONE HCL 5 MG PO TABS
ORAL_TABLET | ORAL | Status: AC
  Filled 2021-11-24: qty 1

## 2021-11-24 MED ORDER — LACTATED RINGERS IV SOLN: INTRAVENOUS | Status: DC

## 2021-11-24 MED ORDER — DEXAMETHASONE SODIUM PHOSPHATE 4 MG/ML IJ SOLN
INTRAMUSCULAR | Status: DC | PRN
  Administered 2021-11-24: 4 mg via INTRAVENOUS

## 2021-11-24 MED ORDER — MIDAZOLAM HCL 2 MG/2ML IJ SOLN
INTRAMUSCULAR | Status: AC
  Filled 2021-11-24: qty 2

## 2021-11-24 SURGICAL SUPPLY — 48 items
ADH SKN CLS APL DERMABOND .7 (GAUZE/BANDAGES/DRESSINGS) ×1
APL PRP STRL LF DISP 70% ISPRP (MISCELLANEOUS) ×1
BLADE CLIPPER SURG (BLADE) IMPLANT
BLADE SURG 15 STRL LF DISP TIS (BLADE) ×1 IMPLANT
BLADE SURG 15 STRL SS (BLADE) ×1
CANISTER SUCT 1200ML W/VALVE (MISCELLANEOUS) IMPLANT
CHLORAPREP W/TINT 26 (MISCELLANEOUS) ×1 IMPLANT
COVER BACK TABLE 60X90IN (DRAPES) ×1 IMPLANT
COVER MAYO STAND STRL (DRAPES) ×1 IMPLANT
DERMABOND ADVANCED .7 DNX12 (GAUZE/BANDAGES/DRESSINGS) ×1 IMPLANT
DRAIN PENROSE .5X12 LATEX STL (DRAIN) ×1 IMPLANT
DRAPE LAPAROTOMY TRNSV 102X78 (DRAPES) ×1 IMPLANT
DRAPE UTILITY XL STRL (DRAPES) ×1 IMPLANT
ELECT COATED BLADE 2.86 ST (ELECTRODE) ×1 IMPLANT
ELECT REM PT RETURN 9FT ADLT (ELECTROSURGICAL) ×1
ELECTRODE REM PT RTRN 9FT ADLT (ELECTROSURGICAL) ×1 IMPLANT
GAUZE 4X4 16PLY ~~LOC~~+RFID DBL (SPONGE) IMPLANT
GAUZE SPONGE 4X4 12PLY STRL LF (GAUZE/BANDAGES/DRESSINGS) IMPLANT
GLOVE BIOGEL PI IND STRL 8 (GLOVE) ×1 IMPLANT
GLOVE ECLIPSE 8.0 STRL XLNG CF (GLOVE) ×1 IMPLANT
GOWN STRL REUS W/ TWL LRG LVL3 (GOWN DISPOSABLE) ×2 IMPLANT
GOWN STRL REUS W/ TWL XL LVL3 (GOWN DISPOSABLE) ×1 IMPLANT
GOWN STRL REUS W/TWL LRG LVL3 (GOWN DISPOSABLE) ×2
GOWN STRL REUS W/TWL XL LVL3 (GOWN DISPOSABLE) ×1
MESH HERNIA SYS ULTRAPRO LRG (Mesh General) IMPLANT
NDL HYPO 25X1 1.5 SAFETY (NEEDLE) ×1 IMPLANT
NEEDLE HYPO 25X1 1.5 SAFETY (NEEDLE) ×1 IMPLANT
NS IRRIG 1000ML POUR BTL (IV SOLUTION) IMPLANT
PACK BASIN DAY SURGERY FS (CUSTOM PROCEDURE TRAY) ×1 IMPLANT
PENCIL SMOKE EVACUATOR (MISCELLANEOUS) ×1 IMPLANT
SLEEVE SCD COMPRESS KNEE MED (STOCKING) ×1 IMPLANT
SPIKE FLUID TRANSFER (MISCELLANEOUS) IMPLANT
SPONGE T-LAP 4X18 ~~LOC~~+RFID (SPONGE) ×1 IMPLANT
STRIP CLOSURE SKIN 1/2X4 (GAUZE/BANDAGES/DRESSINGS) IMPLANT
SUT MON AB 4-0 PC3 18 (SUTURE) ×1 IMPLANT
SUT NOVA 0 T19/GS 22DT (SUTURE) IMPLANT
SUT NOVA NAB DX-16 0-1 5-0 T12 (SUTURE) ×2 IMPLANT
SUT VIC AB 2-0 SH 27 (SUTURE) ×1
SUT VIC AB 2-0 SH 27XBRD (SUTURE) ×1 IMPLANT
SUT VIC AB 3-0 54X BRD REEL (SUTURE) IMPLANT
SUT VIC AB 3-0 BRD 54 (SUTURE)
SUT VICRYL 3-0 CR8 SH (SUTURE) ×1 IMPLANT
SUT VICRYL AB 2 0 TIE (SUTURE) IMPLANT
SUT VICRYL AB 2 0 TIES (SUTURE)
SYR CONTROL 10ML LL (SYRINGE) ×1 IMPLANT
TOWEL GREEN STERILE FF (TOWEL DISPOSABLE) ×1 IMPLANT
TUBE CONNECTING 20X1/4 (TUBING) IMPLANT
YANKAUER SUCT BULB TIP NO VENT (SUCTIONS) IMPLANT

## 2021-11-24 NOTE — Progress Notes (Signed)
Assisted Dr. Kalman Shan with right, transabdominal plane, ultrasound guided block. Side rails up, monitors on throughout procedure. See vital signs in flow sheet. Tolerated Procedure well.

## 2021-11-24 NOTE — H&P (Signed)
History of Present Illness: Gary [name]. is a 71 y.o. male who is seen today as an office consultation for evaluation of Hernia .   Patient presents for evaluation of symptomatic right inguinal hernia. He had developed some mild groin burning. He was seen by his primary care provider and found to have a right inguinal hernia. He has no nausea vomiting. He is scheduled for ablation for his atrial fibrillation in October. He is here today to discuss repair of his right inguinal hernia. He has no nausea vomiting. Pain is quite infrequent but characterized more as a burning sensation. It is not exacerbated by activity but he may have some burning and soreness after the activity.  Review of Systems: A complete review of systems was obtained from the patient. I have reviewed this information and discussed as appropriate with the patient. See HPI as well for other ROS.    Medical History: Past Medical History:  Diagnosis Date  A-fib (CMS-HCC)  Hyperlipidemia  Hypertension   There is no problem list on file for this patient.  Past Surgical History:  Procedure Laterality Date  hip replacement    Allergies  Allergen Reactions  Penicillins Rash  DID THE REACTION INVOLVE: Swelling of the face/tongue/throat, SOB, or low BP? No Sudden or severe rash/hives, skin peeling, or the inside of the mouth or nose? Unknown Did it require medical treatment? Unknown When did it last happen? Childhood Allergy If all above answers are "NO", may proceed with cephalosporin use.   Current Outpatient Medications on File Prior to Visit  Medication Sig Dispense Refill  acetaminophen (TYLENOL) 500 MG tablet 1 tablet as needed  AMBIEN 10 mg tablet 1/4-1 tablet at bedtime as needed  benzonatate (TESSALON) 200 MG capsule TAKE ONE CAPSULE BY MOUTH UP TO THREE TIMES DAILY AS NEEDED FOR COUGH  calcium carbonate (TUMS) 200 mg calcium (500 mg) chewable tablet 2 tablets as needed for heartburn or indigestion   dilTIAZem (CARDIZEM CD) 180 MG CD capsule TAKE ONE CAPSULE BY MOUTH IN THE EVENING  dofetilide (TIKOSYN) 500 MCG capsule Take 500 mcg by mouth 2 (two) times daily  doxycycline monohydrate 100 mg Tab tablet 1 tablet  ELIQUIS 5 mg tablet 1 tablet  famotidine (PEPCID) 20 MG tablet 1 tablet as needed for heartburn or indigestion  gabapentin (NEURONTIN) 100 MG capsule 1-3 capsules  guaiFENesin (MUCINEX) 600 mg SR tablet 1 tablet as needed  metoprolol succinate (TOPROL-XL) 25 MG XL tablet Take by mouth  multivitamin (MULTIPLE VITAMINS ORAL)  omega-3 fatty acids-fish oil 360-1,200 mg Cap Take by mouth  rosuvastatin (CRESTOR) 10 MG tablet 1 tablet  sildenafiL (VIAGRA) 100 MG tablet TAKE 1 TABLET BY MOUTH AS NEEDED EVERY DAY 30 MINS PRIOR TO ACTIVITY  simethicone (MYLICON) 80 MG chewable tablet Take by mouth  valACYclovir (VALTREX) 1000 MG tablet Take 1,000 mg by mouth 2 (two) times daily   No current facility-administered medications on file prior to visit.   Family History  Problem Relation Age of Onset  Hyperlipidemia (Elevated cholesterol) Mother  High blood pressure (Hypertension) Mother  Stroke Mother  Myocardial Infarction (Heart attack) Father    Social History   Tobacco Use  Smoking Status Former  Types: Cigarettes  Quit date: 2003  Years since quitting: 20.5  Smokeless Tobacco Never    Social History   Socioeconomic History  Marital status: Married  Tobacco Use  Smoking status: Former  Types: Cigarettes  Quit date: 2003  Years since quitting: 20.5  Smokeless tobacco: Never  Objective:   Vitals:  07/27/21 1420  BP: 130/72  Pulse: 57  Weight: 93.2 kg (205 lb 6.4 oz)  Height: 182.9 cm (6')   Body mass index is 27.86 kg/m.  Physical Exam HENT:  Head: Normocephalic.  Eyes:  Pupils: Pupils are equal, round, and reactive to light.  Cardiovascular:  Comments: A fib Abdominal:  General: Abdomen is flat.  Palpations: Abdomen is soft.  Hernia: A hernia is  present. Hernia is present in the right inguinal area. There is no hernia in the left inguinal area.  Comments: Small reducible  Musculoskeletal:  General: Normal range of motion.  Cervical back: Normal range of motion.  Skin: General: Skin is warm.  Neurological:  General: No focal deficit present.  Mental Status: He is alert.  Psychiatric:  Mood and Affect: Mood normal.  Behavior: Behavior normal.    Assessment and Plan:   Diagnoses and all orders for this visit:  Right inguinal hernia    Reviewed hernia pathophysiology, natural history and long-term expectations. Discussed hernia repair with the use of mesh. Discussed laparoscopic and open techniques. He is symptomatic therefore repair is recommended. He is opted for an open repair of his right inguinal hernia with mesh. Risk of bleeding, infection, recurrence, organ injury, major blood vessel injury or nerve injury, cardiovascular event, and the need further treatments and/or procedures discussed with him today. We will obtain cardiac clearance.  No follow-ups on file.  Kennieth Francois, MD

## 2021-11-24 NOTE — Anesthesia Preprocedure Evaluation (Signed)
Anesthesia Evaluation  Patient identified by MRN, date of birth, ID band Patient awake    Reviewed: Allergy & Precautions, NPO status , Patient's Chart, lab work & pertinent test results  Airway Mallampati: II  TM Distance: >3 FB Neck ROM: Full    Dental no notable dental hx.    Pulmonary neg pulmonary ROS, former smoker,    Pulmonary exam normal breath sounds clear to auscultation       Cardiovascular hypertension, Pt. on medications and Pt. on home beta blockers Normal cardiovascular exam+ dysrhythmias Atrial Fibrillation  Rhythm:Regular Rate:Normal     Neuro/Psych negative neurological ROS  negative psych ROS   GI/Hepatic Neg liver ROS, GERD  ,  Endo/Other  negative endocrine ROS  Renal/GU negative Renal ROS  negative genitourinary   Musculoskeletal negative musculoskeletal ROS (+)   Abdominal   Peds negative pediatric ROS (+)  Hematology negative hematology ROS (+)   Anesthesia Other Findings   Reproductive/Obstetrics negative OB ROS                             Anesthesia Physical Anesthesia Plan  ASA: 3  Anesthesia Plan: General   Post-op Pain Management: Regional block*   Induction: Intravenous  PONV Risk Score and Plan: 2 and Ondansetron, Dexamethasone and Treatment may vary due to age or medical condition  Airway Management Planned: LMA  Additional Equipment:   Intra-op Plan:   Post-operative Plan: Extubation in OR  Informed Consent: I have reviewed the patients History and Physical, chart, labs and discussed the procedure including the risks, benefits and alternatives for the proposed anesthesia with the patient or authorized representative who has indicated his/her understanding and acceptance.     Dental advisory given  Plan Discussed with: CRNA and Surgeon  Anesthesia Plan Comments:         Anesthesia Quick Evaluation

## 2021-11-24 NOTE — Anesthesia Postprocedure Evaluation (Signed)
Anesthesia Post Note  Patient: Gary [name]  Procedure(s) Performed: RIGHT INGUINAL HERNIA REPAIR WITH MESH (Right)     Patient location during evaluation: PACU Anesthesia Type: General Level of consciousness: awake and alert Pain management: pain level controlled Vital Signs Assessment: post-procedure vital signs reviewed and stable Respiratory status: spontaneous breathing, nonlabored ventilation, respiratory function stable and patient connected to nasal cannula oxygen Cardiovascular status: blood pressure returned to baseline and stable Postop Assessment: no apparent nausea or vomiting Anesthetic complications: no   No notable events documented.  Last Vitals:  Vitals:   11/24/21 0930 11/24/21 0947  BP: (!) 118/50 (!) 109/52  Pulse: (!) 55 (!) 54  Resp: 18 20  Temp:  36.6 C  SpO2: 97% 98%    Last Pain:  Vitals:   11/24/21 0947  TempSrc:   PainSc: 3                  Bell Cai S

## 2021-11-24 NOTE — Op Note (Signed)
Right inguinal  Hernia repair with mesh  Indications: The patient presented with a history of a right, reducible  inguinal hernia.   Open and laparoscopic techniques discussed and mesh use discussed. He opted for open repair of his right inguinal hernia. The risk of hernia repair include bleeding,  Infection,   Recurrence of the hernia,  Mesh use, chronic pain,  Organ injury,  Bowel injury,  Bladder injury,   nerve injury with numbness around the incision,  Death,  and worsening of preexisting  medical problems.  The alternatives to surgery have been discussed as well..  Long term expectations of both operative and non operative treatments have been discussed.   The patient agrees to proceed.   Pre-operative Diagnosis: right reducible inguinal hernia   Post-operative Diagnosis: same  Surgeon: Turner Daniels MD   Assistants: NONE   Anesthesia: General endotracheal anesthesia and Local anesthesia 0.25.% bupivacaine, with epinephrine and TAP   ASA Class: 2  Procedure Details  The patient was seen again in the Holding Room. TAP block placed by anesthesia.  The risks, benefits, complications, treatment options, and expected outcomes were discussed with the patient. The possibilities of reaction to medication, pulmonary aspiration, perforation of viscus, bleeding, recurrent infection, the need for additional procedures, and development of a complication requiring transfusion or further operation were discussed with the patient and/or family. There was concurrence with the proposed plan, and informed consent was obtained. The site of surgery was properly noted/marked. The patient was taken to the Operating Room, identified as MATIAS THURMAN, and the procedure verified as hernia repair. A Time Out was held and the above information confirmed.  The patient was placed in the supine position and underwent induction of anesthesia, the lower abdomen and groin was prepped and draped in the standard fashion,  and 0.25% Marcaine with epinephrine was used to anesthetize the skin over the mid-portion of the inguinal canal. A transverse incision was made. Dissection was carried through the soft tissue to expose the inguinal canal and inguinal ligament along its lower edge. The external oblique fascia was split along the course of its fibers, exposing the inguinal canal. The cord was looped using a Penrose drain and reflected out of the field.  The ilioinguinal nerve was divided to prevent entrapment. The  indirect  and direct  defects were   exposed and a piece of prolene hernia system ultrapro mesh was and placed into  the  indirect  defect. Interupted -0 novafil suture was then used  to repair the defect, with the suture being sewn from the pubic tubercle inferiorly and superiorly along the canal to a level just beyond the internal ring. The mesh was split to allow passage of the cord  into the canal without entrapment.  The iliohypogastric nerve was identified and preserved. There was ample room for the cord structures through the mesh.  The contents were then returned to canal and the external oblique fashion was then closed in a continuous fashion using 2-0 Vicryl suture taking care not to cause entrapment. Scarpa's layer closed with 3 0 vicryl and 4 0 monocryl used to close the skin.  Dermabond used for dressing.  Instrument, sponge, and needle counts were correct prior to closure and at the conclusion of the case.  Findings: direct and indirect  inguinal hernia   Estimated Blood Loss: Minimal         Drains: None         Total IV Fluids: per record  Specimens: none               Complications: None; patient tolerated the procedure well.         Disposition: PACU - hemodynamically stable.         Condition: stable

## 2021-11-24 NOTE — Anesthesia Procedure Notes (Signed)
Anesthesia Regional Block: TAP block   Pre-Anesthetic Checklist: , timeout performed,  Correct Patient, Correct Site, Correct Laterality,  Correct Procedure, Correct Position, site marked,  Risks and benefits discussed,  Surgical consent,  Pre-op evaluation,  At surgeon's request and post-op pain management  Laterality: Right  Prep: chloraprep       Needles:  Injection technique: Single-shot  Needle Type: Echogenic Needle     Needle Length: 9cm      Additional Needles:   Procedures:,,,, ultrasound used (permanent image in chart),,    Narrative:  Start time: 11/24/2021 7:00 AM End time: 11/24/2021 7:10 AM Injection made incrementally with aspirations every 5 mL.  Performed by: Personally  Anesthesiologist: Myrtie Soman, MD  Additional Notes: Patient tolerated the procedure well without complications

## 2021-11-24 NOTE — Interval H&P Note (Signed)
History and Physical Interval Note:  11/24/2021 7:31 AM  Gary [name]  has presented today for surgery, with the diagnosis of RIGHT INGUINAL HERNIA.  The various methods of treatment have been discussed with the patient and family. After consideration of risks, benefits and other options for treatment, the patient has consented to  Procedure(s) with comments: Mather (Right) - GENERAL AND TAP BLOCK as a surgical intervention.  The patient's history has been reviewed, patient examined, no change in status, stable for surgery.  I have reviewed the patient's chart and labs.  Questions were answered to the patient's satisfaction.   The risk of hernia repair include bleeding,  Infection,   Recurrence of the hernia,  Mesh use, chronic pain,  Organ injury,  Bowel injury,  Bladder injury,   nerve injury with numbness around the incision,  Death,  and worsening of preexisting  medical problems.  The alternatives to surgery have been discussed as well..  Long term expectations of both operative and non operative treatments have been discussed.   The patient agrees to proceed.   Bar Nunn

## 2021-11-24 NOTE — Anesthesia Procedure Notes (Signed)
Anesthesia Procedure Image    

## 2021-11-24 NOTE — Discharge Instructions (Addendum)
[facility] _______[facility]  UMBILICAL OR INGUINAL HERNIA REPAIR: POST OP INSTRUCTIONS  Always review your discharge instruction sheet given to you by the facility where your surgery was performed. IF YOU HAVE DISABILITY OR FAMILY LEAVE FORMS, YOU MUST BRING THEM TO THE OFFICE FOR PROCESSING.   DO NOT GIVE THEM TO YOUR DOCTOR.  1. A  prescription for pain medication may be given to you upon discharge.  Take your pain medication as prescribed, if needed.  If narcotic pain medicine is not needed, then you may take acetaminophen (Tylenol) or ibuprofen (Advil) as needed. May have Tylenol/ Ibuprofen starting today, 11/24/2021, at any time.  2. Take your usually prescribed medications unless otherwise directed. If you need a refill on your pain medication, please contact your pharmacy.  They will contact our office to request authorization. Prescriptions will not be filled after 5 pm or on week-ends. 3. You should follow a light diet the first 24 hours after arrival home, such as soup and crackers, etc.  Be sure to include lots of fluids daily.  Resume your normal diet the day after surgery. 4.Most patients will experience some swelling and bruising around the umbilicus or in the groin and scrotum.  Ice packs and reclining will help.  Swelling and bruising can take several days to resolve.  6. It is common to experience some constipation if taking pain medication after surgery.  Increasing fluid intake and taking a stool softener (such as Colace) will usually help or prevent this problem from occurring.  A mild laxative (Milk of Magnesia or Miralax) should be taken according to package directions if there are no bowel movements after 48 hours. 7. Unless discharge instructions indicate otherwise, you may remove your bandages 24-48 hours after surgery, and you may shower at that time.  You may have steri-strips (small skin tapes) in place directly over the incision.  These strips should be left on  the skin for 7-10 days.  If your surgeon used skin glue on the incision, you may shower in 24 hours.  The glue will flake off over the next 2-3 weeks.  Any sutures or staples will be removed at the office during your follow-up visit. 8. ACTIVITIES:  You may resume regular (light) daily activities beginning the next day--such as daily self-care, walking, climbing stairs--gradually increasing activities as tolerated.  You may have sexual intercourse when it is comfortable.  Refrain from any heavy lifting or straining until approved by your doctor.  a.You may drive when you are no longer taking prescription pain medication, you can comfortably wear a seatbelt, and you can safely maneuver your car and apply brakes. b.RETURN TO WORK:   _____________________________________________  9.You should see your doctor in the office for a follow-up appointment approximately 2-3 weeks after your surgery.  Make sure that you call for this appointment within a day or two after you arrive home to insure a convenient appointment time. 10.OTHER INSTRUCTIONS: _________________________    _____________________________________  WHEN TO CALL YOUR DOCTOR: Fever over 101.0 Inability to urinate Nausea and/or vomiting Extreme swelling or bruising Continued bleeding from incision. Increased pain, redness, or drainage from the incision  The clinic staff is available to answer your questions during regular business hours.  Please don't hesitate to call and ask to speak to one of the nurses for clinical concerns.  If you have a medical emergency, go to the nearest emergency room or call 911.  A surgeon from [facility] is always on call  at the hospital   [address] [place], [place], [place]  [address] ?  [address], [place], [place]   [address] [phone] ? [phone] ? FAX [phone] Web site: [email or web address]    Restart Eloquis on wed    Post Anesthesia Home Care  Instructions  Activity: Get plenty of rest for the remainder of the day. A responsible individual must stay with you for 24 hours following the procedure.  For the next 24 hours, DO NOT: -Drive a car -Paediatric nurse -Drink alcoholic beverages -Take any medication unless instructed by your physician -Make any legal decisions or sign important papers.  Meals: Start with liquid foods such as gelatin or soup. Progress to regular foods as tolerated. Avoid greasy, spicy, heavy foods. If nausea and/or vomiting occur, drink only clear liquids until the nausea and/or vomiting subsides. Call your physician if vomiting continues.  Special Instructions/Symptoms: Your throat may feel dry or sore from the anesthesia or the breathing tube placed in your throat during surgery. If this causes discomfort, gargle with warm salt water. The discomfort should disappear within 24 hours.  If you had a scopolamine patch placed behind your ear for the management of post- operative nausea and/or vomiting:  1. The medication in the patch is effective for 72 hours, after which it should be removed.  Wrap patch in a tissue and discard in the trash. Wash hands thoroughly with soap and water. 2. You may remove the patch earlier than 72 hours if you experience unpleasant side effects which may include dry mouth, dizziness or visual disturbances. 3. Avoid touching the patch. Wash your hands with soap and water after contact with the patch.

## 2021-11-24 NOTE — Transfer of Care (Signed)
Immediate Anesthesia Transfer of Care Note  Patient: Gary [name]  Procedure(s) Performed: RIGHT INGUINAL HERNIA REPAIR WITH MESH (Right)  Patient Location: PACU  Anesthesia Type:GA combined with regional for post-op pain  Level of Consciousness: awake, alert , oriented and patient cooperative  Airway & Oxygen Therapy: Patient Spontanous Breathing and Patient connected to face mask oxygen  Post-op Assessment: Report given to RN and Post -op Vital signs reviewed and stable  Post vital signs: Reviewed and stable  Last Vitals:  Vitals Value Taken Time  BP    Temp    Pulse 60 11/24/21 0908  Resp 26 11/24/21 0908  SpO2 99 % 11/24/21 0908  Vitals shown include unvalidated device data.  Last Pain:  Vitals:   11/24/21 0642  TempSrc: Oral  PainSc: 0-No pain         Complications: No notable events documented.

## 2021-11-24 NOTE — Anesthesia Procedure Notes (Signed)
Procedure Name: Intubation Date/Time: 11/24/2021 7:47 AM  Performed by: Amyriah Buras, Ernesta Amble, CRNAPre-anesthesia Checklist: Patient identified, Emergency Drugs available, Suction available and Patient being monitored Patient Re-evaluated:Patient Re-evaluated prior to induction Oxygen Delivery Method: Circle system utilized Preoxygenation: Pre-oxygenation with 100% oxygen Induction Type: IV induction Ventilation: Mask ventilation without difficulty Laryngoscope Size: Mac Grade View: Grade II Tube type: Oral Tube size: 7.0 mm Number of attempts: 1 Airway Equipment and Method: Stylet and Oral airway Placement Confirmation: ETT inserted through vocal cords under direct vision, positive ETCO2 and breath sounds checked- equal and bilateral Secured at: 21 cm Tube secured with: Tape Dental Injury: Teeth and Oropharynx as per pre-operative assessment

## 2021-11-25 ENCOUNTER — Encounter (HOSPITAL_BASED_OUTPATIENT_CLINIC_OR_DEPARTMENT_OTHER): Payer: Self-pay | Admitting: Surgery

## 2021-11-25 NOTE — Progress Notes (Signed)
Left message stating courtesy call and if any questions or concerns please call the doctors office.  

## 2021-11-30 ENCOUNTER — Telehealth: Payer: Self-pay | Admitting: Podiatry

## 2021-11-30 NOTE — Telephone Encounter (Signed)
DOS: 12/29/2021  [employer] Effective 01/25/2021  Hammertoe Repair 5th Rt ([id number])  Webbing Procedure 4th Rt ([id number])  DX: M20.41 and D49.2  Out-of-Pocket: $3,450 Copay: $225  [employer] Authorization #: [id number]  Authorization Valid: 12/29/2021 - 03/29/2022

## 2021-12-14 ENCOUNTER — Ambulatory Visit: Payer: PPO | Admitting: Podiatry

## 2021-12-14 ENCOUNTER — Encounter: Payer: Self-pay | Admitting: Podiatry

## 2021-12-14 ENCOUNTER — Encounter: Payer: Self-pay | Admitting: Cardiology

## 2021-12-14 DIAGNOSIS — M2041 Other hammer toe(s) (acquired), right foot: Secondary | ICD-10-CM | POA: Diagnosis not present

## 2021-12-14 DIAGNOSIS — D367 Benign neoplasm of other specified sites: Secondary | ICD-10-CM | POA: Diagnosis not present

## 2021-12-15 ENCOUNTER — Telehealth: Payer: Self-pay

## 2021-12-15 ENCOUNTER — Ambulatory Visit (HOSPITAL_COMMUNITY): Payer: PPO | Admitting: Physician Assistant

## 2021-12-15 NOTE — Telephone Encounter (Signed)
Gary [name] called to cancel his surgery with Dr. Paulla Dolly on 12/29/2021. He stated he has to many things going on through the holidays to have surgery at this time. He also stated he his having an ablation 02/04/2022 and will need to get cardiac clearance. He will call me back to reschedule. Notified Dr. Paulla Dolly and Caren Griffins at [facility].

## 2021-12-15 NOTE — Progress Notes (Signed)
Subjective:   Patient ID: Gary [name], male   DOB: 71 y.o.   MRN: [id number]   HPI Patient presents stating he is ready for surgery on December 5 and wants to review   ROS      Objective:  Physical Exam  Neurovascular status intact muscle strength adequate keratotic lesion between the fourth and fifth digits right painful when pressed with enlargement head of proximal phalanx digit 5 right.  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  Patient wants surgery understands that all complications can occur and the correction will take several months to heal completely.  Patient wants procedure and I reviewed it with him he will stop his blood thinner approximate 3 days prior to procedure and then can resume the day of surgery.  All questions answered scheduled outpatient surgery

## 2021-12-20 ENCOUNTER — Ambulatory Visit (INDEPENDENT_AMBULATORY_CARE_PROVIDER_SITE_OTHER): Payer: PPO | Admitting: Neurology

## 2021-12-20 DIAGNOSIS — G478 Other sleep disorders: Secondary | ICD-10-CM

## 2021-12-20 DIAGNOSIS — I480 Paroxysmal atrial fibrillation: Secondary | ICD-10-CM

## 2021-12-20 DIAGNOSIS — I4892 Unspecified atrial flutter: Secondary | ICD-10-CM

## 2021-12-20 DIAGNOSIS — F5104 Psychophysiologic insomnia: Secondary | ICD-10-CM

## 2021-12-21 NOTE — Procedures (Signed)
[facility] POLYSOMNOGRAPHY  INTERPRETATION REPORT   STUDY DATE:  12/20/2021     PATIENT NAME:  Gary [name]         DATE OF BIRTH:  09/11/1956  PATIENT ID:  [id number]    TYPE OF STUDY:  PSG  READING PHYSICIAN: Larey Seat, MD REFERRED BY: Serita Grammes, MD SCORING TECHNICIAN: Richard Miu, RPSGT   HISTORY:  This 71 year-old Male patient presented on 10-14-2021 for a second opinion, following a previous work-up for organic insomnia.  Patient with insomnia- and recurrent atrial fibrillation- had a HST sleep study this year, 02-2021 no apnea was found. Failed cardioversion and ablation - cardio-drug Tikosyn interferes with other meds- PCP wanted to try meds to treat insomnia. Remote history of polycythemia. Used to smoke. Has had CBT and uses Ambien . He reports waking up around 4 AM and being unable to re-initiate sleep, sometimes for hours. He has a history of atrial fibrillation, had failed cardioversion and finally ablation, Fibromyalgia,  spinal stenosis, HTN GERD,  ADDITIONAL INFORMATION:  The Epworth Sleepiness Scale endorsed at 12 /24 points (scores above or equal to 10 are suggestive of hypersomnolence). FSS endorsed at 48 /63 points. The geriatric depression score was endorsed at 7/ 15 points - all scores were elevated   Height: 72 in Weight: 198 lbs (BMI 26) Neck Size: 16 in  MEDICATIONS: Tylenol, Tums, Flexeril, Cardizem, Tikosyn, Eliquis, Pepcid, Toprol - XL, Multivitamins, Fish Oil, Crestor, Viagra, Mylicon, Belsomra, Valtrex, Ambien, Doxycycline, Neurontin, Kenalog  TECHNICAL DESCRIPTION: A registered sleep technologist ( RPSGT)  was in attendance for the duration of the recording.  Data collection, scoring, video monitoring, and reporting were performed in compliance with the AASM Manual for the Scoring of Sleep and Associated Events; (Hypopnea is scored based on the criteria listed in Section VIII D. 1b in the AASM Manual V2.6 using a 4% oxygen  desaturation rule or Hypopnea is scored based on the criteria listed in Section VIII D. 1a in the AASM Manual V2.6 using 3% oxygen desaturation and /or arousal rule).   SLEEP CONTINUITY AND SLEEP ARCHITECTURE:  Lights-out was at 21:00: and lights-on at  05:00:, with  8.0 hours ( 480 minutes) of recording time . Total sleep time (TST) was 315.5 minutes with a decreased sleep efficiency at 65.7%.  Sleep latency at 30.0 minutes.  REM sleep latency at 88.5 minutes.  Of the total sleep time, the percentage of stage N1 sleep was 6.0%, stage N2 sleep was 80%, stage N3 sleep was 4.1%, and REM sleep was 10.3%. Wake after sleep onset (WASO) time accounted for 134 minutes . There were 3 Stage R periods observed on this study night, 22 awakenings (i.e. transitions to Stage W from any sleep stage), and 65 total stage transitions.  BODY POSITION:  TST was divided between the following sleep positions: supine 75 minutes (24%), non-supine 240 minutes (76%); right 170 minutes (54%), left 70 minutes (22%), and prone 00 minutes (0%). Total supine REM sleep time was 14 minutes (45% of total REM sleep).  RESPIRATORY MONITORING:  Based on AASM criteria (using a 3% oxygen desaturation and /or arousal rule for scoring hypopneas), there were 0 apneas (0 obstructive; 0 central; 0 mixed), and 5 hypopneas.  Apnea index was 0.0/h. Hypopnea index was 1.0/h. The AHI (apnea-hypopnea index) was 1/h overall (1.6/h AHI in supine, 3/h AHI in non-supine; AHI was 5.5/h in  REM, 8.3/ h in supine REM).There were 0 respiratory effort-related arousals (RERAs).   OXIMETRY:  Oxyhemoglobin Saturation Nadir during sleep was at  91%)from a mean of 96%.  Of the Total sleep time (TST)  hypoxemia (=<88%) was present for  0.0 minutes. LIMB MOVEMENTS: There were 52 periodic limb movements of sleep (9.9/h), of which 0 (0.0/h) were associated with an arousal. AROUSAL: There were 24 arousals in total, for an arousal index of 5 arousals/hour.  Of these, 2 were  identified as respiratory-related arousals (0 /h), 0 were PLM-related arousals (0 /h), and 34 were non-specific arousals (6 /h). EEG:  PSG EEG was of normal amplitude and frequency, with symmetric manifestation of sleep stages. EKG: The electrocardiogram showed NSR and appeared so very regular, almost paced.  The average heart rate during sleep was 55 bpm.  The heart rate during sleep varied between a minimum of 50 bpm and  a maximum of  64 bpm.  IMPRESSION: INSOMNIA with sleep interruption  1) Sleep disordered breathing was not present.    2) Periodic limb movements were present but did not cause arousals. 3) Sleep efficiency was 66% Total sleep time was reduced at 315.5 minutes.   Sleep fragmentation was noted- The majority of sleep arousals was related to spontaneous arousals, causing changes in EEG, EMG and other channels at the same time-.  I did not see a correlation with hypoxia, with limb movements, nor with soring.   RECOMMENDATIONS: I cannot explain why the patient has such fragmented sleep but can rule out organic causes.   Larey Seat, MD            General Information  Name: Gary, [name] BMI: 26.85 Physician: ,   ID: [id number] Height: 72.0 in Technician: Richard Miu  Sex: Male Weight: 198.0 lb Record: [id number]  Age: 43 [09/11/1956] Date: 12/20/2021 Scorer: [place] & Medication History    Gary [name] was seen upon  a referral on 10/14/2021 from PCP Brigitte Pulse for a second [place] for INSOMNIA> .  Chief concern according to patient : " I have been worked up for organic sleep disorders, remaining problem is to stay asleep- I went through CBT online 5 6 weeks. He keeps a sleep journal, he falls asleep promptly- "I have the urge to urinate when I wake up for any reason. Sleep intervals of 2 hours, sometimes with hours of wakefulness in between -especially at 4 AM- he reports leaving the bedroom and reading in another room- total sleep time Gary [name]  has a past medical history of Arthritis, Atrial flutter ([place]) (2007), Back pain, Fibromyalgia, GERD (gastroesophageal reflux disease), Hyperlipidemia, Hypertension, Insomnia, NICM (nonischemic cardiomyopathy) ([place]), Obesity, Paroxysmal atrial fibrillation ([place]), Pneumonia, and Spinal stenosis. The patient an apnea link sleep study in 2-21- 2023 with a result of an AHI (Apnea Hypopnea index) of 3.7/h,  Tylenol, Tums, Flexeril, Cardizem, Tikosyn, Eliquis, Pepcid, Toprol - XL, Multvitamin, Fish Oil, Crestor, Viagra, Mylicon, Belsomra, Valtrex, AMbien, Doxycycline, Neurontin, Kenalog   Sleep Disorder      Comments   The patient came into the sleep lab for a PSG. He had a prior HST on 03/17/21 with another sleep lab. Per the patient he did not take his prescription for Neurontin. The patient did not take Ambien. He did not want to take the medication until 2 am. Per patient that is around the time that he wakes and can't go back to sleep. I informed him that tech could not let him take the Ambien at that time them drive a few hours later. Tech informed him Ambien would need  to be taken prior to bed. Patient declined taking the Ambien. The patient had a few periods of wake after sleep onset however, no periods of being awake and not being able to go back to sleep. At certain times when the patient would wake up, he would look at his watch. Two restroom trips. Patient has known A-fib. No snoring noted.  PLM's witnessed. All respiratory events scored with a 4% desat. The patient slept supine and lateral.     Lights out: 09:00:10 PM Lights on: 05:00:17 AM   Time Total Supine Side Prone Upright  Recording (TRT) 8h 0.7m2h 59.098mh 1.67m17m 0.67m 549m0.67m  7mep (TST) 5h 15.49m 1h549m.49m 4h 567mm 0h 013m 0h 0.2m  Late467m N1 N2 N3 REM Onset Per. Slp. Eff.  Actual 0h 30.67m 0h 31.5549mh 40.67m49m 28.49m 47m30.67m 189m3.67m 6569m%   Stg44mr Wake N1 N2 N3 REM  Total 134.5 19.0 251.0 13.0 32.5  Supine 73.5 3.0 58.0 0.0 14.5   Side 61.0 16.0 193.0 13.0 18.0  Prone 0.0 0.0 0.0 0.0 0.0  Upright 0.0 0.0 0.0 0.0 0.0   Stg % Wake N1 N2 N3 REM  Total 29.9 6.0 79.6 4.1 10.3  Supine 16.3 1.0 18.4 0.0 4.6  Side 13.6 5.1 61.2 4.1 5.7  Prone 0.0 0.0 0.0 0.0 0.0  Upright 0.0 0.0 0.0 0.0 0.0     Apnea Summary Sub Supine Side Prone Upright  Total 0 Total 0 0 0 0 0    REM 0 0 0 0 0    NREM 0 0 0 0 0  Obs 0 REM 0 0 0 0 0    NREM 0 0 0 0 0  Mix 0 REM 0 0 0 0 0    NREM 0 0 0 0 0  Cen 0 REM 0 0 0 0 0    NREM 0 0 0 0 0   Rera Summary Sub Supine Side Prone Upright  Total 0 Total 0 0 0 0 0    REM 0 0 0 0 0    NREM 0 0 0 0 0   Hypopnea Summary Sub Supine Side Prone Upright  Total 5 Total '5 2 3 '$ 0 0    REM '3 2 1 '$ 0 0    NREM 2 0 2 0 0   4% Hypopnea Summary Sub Supine Side Prone Upright  Total (4%) 4 Total '4 1 3 '$ 0 0    REM '2 1 1 '$ 0 0    NREM 2 0 2 0 0     AHI Total Obs Mix Cen  0.95 Apnea 0.00 0.00 0.00 0.00   Hypopnea 0.95 -- -- --  0.76 Hypopnea (4%) 0.76 -- -- --    Total Supine Side Prone Upright  Position AHI 0.95 1.59 0.75 0.00 0.00  REM AHI 5.54   NREM AHI 0.42   Position RDI 0.95 1.59 0.75 0.00 0.00  REM RDI 5.54   NREM RDI 0.42    4% Hypopnea Total Supine Side Prone Upright  Position AHI (4%) 0.76 0.79 0.75 0.00 0.00  REM AHI (4%) 3.69   NREM AHI (4%) 0.42   Position RDI (4%) 0.76 0.79 0.75 0.00 0.00  REM RDI (4%) 3.69   NREM RDI (4%) 0.42    Desaturation Information Threshold: 2% <100% <90% <80% <70% <60% <50% <40%  Supine 49.0 1.0 0.0 0.0 0.0 0.0 0.0  Side 67.0 1.0 0.0 0.0 0.0 0.0 0.0  Prone 0.0 0.0 0.0 0.0 0.0 0.0 0.0  Upright 0.0 0.0 0.0 0.0 0.0 0.0 0.0  Total 116.0 2.0 0.0 0.0 0.0 0.0 0.0  Index 15.5 0.3 0.0 0.0 0.0 0.0 0.0   Threshold: 3% <100% <90% <80% <70% <60% <50% <40%  Supine 11.0 1.0 0.0 0.0 0.0 0.0 0.0  Side 6.0 1.0 0.0 0.0 0.0 0.0 0.0  Prone 0.0 0.0 0.0 0.0 0.0 0.0 0.0  Upright 0.0 0.0 0.0 0.0 0.0 0.0 0.0  Total 17.0 2.0 0.0 0.0 0.0 0.0 0.0  Index 2.3 0.3 0.0 0.0 0.0  0.0 0.0   Threshold: 4% <100% <90% <80% <70% <60% <50% <40%  Supine 6.0 1.0 0.0 0.0 0.0 0.0 0.0  Side 5.0 1.0 0.0 0.0 0.0 0.0 0.0  Prone 0.0 0.0 0.0 0.0 0.0 0.0 0.0  Upright 0.0 0.0 0.0 0.0 0.0 0.0 0.0  Total 11.0 2.0 0.0 0.0 0.0 0.0 0.0  Index 1.5 0.3 0.0 0.0 0.0 0.0 0.0   Threshold: 4% <100% <90% <80% <70% <60% <50% <40%  Supine 6 1 0 0 0 0 0  Side 5 1 0 0 0 0 0  Prone 0 0 0 0 0 0 0  Upright 0 0 0 0 0 0 0  Total 11 2 0 0 0 0 0   Awakening/Arousal Information # of Awakenings 22  Wake after sleep onset 134.30m Wake after persistent sleep 101.535m Arousal Assoc. Arousals Index  Apneas 0 0.0  Hypopneas 2 0.4  Leg Movements 0 0.0  Snore 0 0.0  PTT Arousals 0 0.0  Spontaneous 34 6.5  Total 36 6.8  Leg Movement Information PLMS LMs Index  Total LMs during PLMS 52 9.9  LMs w/ Microarousals 0 0.0   LM LMs Index  w/ Microarousal 0 0.0  w/ Awakening 0 0.0  w/ Resp Event 0 0.0  Spontaneous 9 1.7  Total 9 1.7     Desaturation threshold setting: 4% Minimum desaturation setting: 10 seconds SaO2 nadir: 84% The longest event was a 40 sec obstructive Hypopnea with a minimum SaO2 of 95%. The lowest SaO2 was 91% associated with a 35 sec obstructive Hypopnea. EKG Rates EKG Avg Max Min  Awake 56 81 47  Asleep 55 64 50  EKG Events: N/A

## 2021-12-22 ENCOUNTER — Telehealth: Payer: Self-pay

## 2021-12-22 NOTE — Telephone Encounter (Signed)
-----   Message from Larey Seat, MD sent at 12/21/2021  1:48 PM EST ----- IMPRESSION: INSOMNIA with sleep interruption  1) Sleep disordered breathing was not present.    2) Periodic limb movements were present but did not cause arousals. 3) Sleep efficiency was 66% Total sleep time was reduced at 315.5 minutes.   Sleep fragmentation was noted- The majority of sleep arousals was related to spontaneous arousals, causing changes in EEG, EMG and other channels at the same time-.  I did not see a correlation with hypoxia, with limb movements, nor with soring.   RECOMMENDATIONS: I cannot explain why the patient has such fragmented sleep but can rule out organic ( sleep physiologic) causes.   Larey Seat, MD

## 2021-12-22 NOTE — Telephone Encounter (Signed)
I called patient.  I discussed his sleep study results and recommendations.  Patient will follow-up with his PCP.  Patient verbalized understanding of results.  Patient had no questions or concerns at this time.

## 2021-12-24 ENCOUNTER — Ambulatory Visit: Payer: PPO | Admitting: Podiatry

## 2022-01-04 ENCOUNTER — Other Ambulatory Visit: Payer: PPO

## 2022-01-04 ENCOUNTER — Encounter: Payer: PPO | Admitting: Podiatry

## 2022-01-09 ENCOUNTER — Other Ambulatory Visit: Payer: Self-pay | Admitting: Interventional Cardiology

## 2022-01-12 ENCOUNTER — Encounter: Payer: Self-pay | Admitting: *Deleted

## 2022-01-12 ENCOUNTER — Telehealth: Payer: Self-pay | Admitting: *Deleted

## 2022-01-12 DIAGNOSIS — I4819 Other persistent atrial fibrillation: Secondary | ICD-10-CM

## 2022-01-12 NOTE — Telephone Encounter (Signed)
Pt scheduled to see Dr. Curt Bears on 1/4 for H&P and pre procedure lab work. Pt aware will go over CT & ablation instructions at the [place], but that I will send via mychart today. Patient verbalized understanding and agreeable to plan.

## 2022-01-20 ENCOUNTER — Encounter: Payer: PPO | Admitting: Podiatry

## 2022-01-28 ENCOUNTER — Ambulatory Visit: Payer: PPO | Attending: Cardiology | Admitting: Cardiology

## 2022-01-28 ENCOUNTER — Encounter: Payer: Self-pay | Admitting: Cardiology

## 2022-01-28 VITALS — BP 130/72 | HR 62 | Ht 72.0 in | Wt 198.0 lb

## 2022-01-28 DIAGNOSIS — Z01812 Encounter for preprocedural laboratory examination: Secondary | ICD-10-CM | POA: Diagnosis not present

## 2022-01-28 DIAGNOSIS — I4819 Other persistent atrial fibrillation: Secondary | ICD-10-CM | POA: Diagnosis not present

## 2022-01-28 DIAGNOSIS — I10 Essential (primary) hypertension: Secondary | ICD-10-CM

## 2022-01-28 DIAGNOSIS — D6869 Other thrombophilia: Secondary | ICD-10-CM | POA: Diagnosis not present

## 2022-01-28 NOTE — H&P (View-Only) (Signed)
Electrophysiology Office Note   Date:  01/28/2022   ID:  Gary [name], Gary [name] 03/21/1950, MRN [id number]  PCP:  [name], MD  Cardiologist:  Irish Lack Primary Electrophysiologist:  Antwon Rochin Meredith Leeds, MD    Chief Complaint: AF   History of Present Illness: Gary [name] is a 72 y.o. male who is being seen today for the evaluation of AF at the request of [name], MD. Presenting today for electrophysiology evaluation.  He has a history of significant nonischemic cardiomyopathy, hypertension, hyperlipidemia, atrial flutter, atrial fibrillation.  He is on dofetilide.  He is post atrial fibrillation ablation in 2020 with PVI and posterior wall isolation.  He is continued to have episodes of atrial fibrillation.  He has plan for repeat ablation 02/04/2022.  Today, denies symptoms of palpitations, chest pain, shortness of breath, orthopnea, PND, lower extremity edema, claudication, dizziness, presyncope, syncope, bleeding, or neurologic sequela. The patient is tolerating medications without difficulties.  Currently feeling well.  He has no chest pain or shortness of breath.  He has had no further episodes of atrial fibrillation.  He is ready for ablation.   Past Medical History:  Diagnosis Date   Arthritis    Atrial flutter ([place]) 2007   s/p CTI ablation by Dr Lovena Le   Back pain    resolved per pt 12/06/18   Dysrhythmia    afib   Fibromyalgia    GERD (gastroesophageal reflux disease)    Hyperlipidemia    Hypertension    Insomnia    occasional per pt 12/06/18   NICM (nonischemic cardiomyopathy) ([place])    a. 2005 - tachy mediated. normalized after return of NSR.   Obesity    Paroxysmal atrial fibrillation ([facility])    Pneumonia    x 1   Spinal stenosis    Past Surgical History:  Procedure Laterality Date   ATRIAL FIBRILLATION ABLATION N/A 03/22/2018   Procedure: ATRIAL FIBRILLATION ABLATION;  Surgeon: Thompson Grayer, MD;  Location: [facility];  Service:  Cardiovascular;  Laterality: N/A;   atrial flutter ablation  06/10/2005   CTI ablation by Dr Lovena Le for atrial flutter   CARDIOVERSION N/A 07/20/2016   Procedure: CARDIOVERSION;  Surgeon: Minus Breeding, MD;  Location: [facility];  Service: Cardiovascular;  Laterality: N/A;   CARDIOVERSION N/A 12/13/2017   Procedure: CARDIOVERSION;  Surgeon: Thayer Headings, MD;  Location: [facility];  Service: Cardiovascular;  Laterality: N/A;   CARDIOVERSION N/A 01/30/2018   Procedure: CARDIOVERSION;  Surgeon: Fay Records, MD;  Location: [facility];  Service: Cardiovascular;  Laterality: N/A;   CARDIOVERSION N/A 06/27/2018   Procedure: CARDIOVERSION;  Surgeon: Buford Dresser, MD;  Location: [facility];  Service: Cardiovascular;  Laterality: N/A;   CARDIOVERSION N/A 08/16/2018   Procedure: CARDIOVERSION;  Surgeon: [name], MD;  Location: [facility];  Service: Cardiovascular;  Laterality: N/A;   CARDIOVERSION N/A 08/28/2018   Procedure: CARDIOVERSION;  Surgeon: Acie Fredrickson Wonda Cheng, MD;  Location: [facility];  Service: Cardiovascular;  Laterality: N/A;   COLONOSCOPY     EYE SURGERY Bilateral 2019   cataracts removed   INGUINAL HERNIA REPAIR Right 11/24/2021   Procedure: RIGHT INGUINAL HERNIA REPAIR WITH MESH;  Surgeon: Erroll Luna, MD;  Location: [facility];  Service: General;  Laterality: Right;  GENERAL AND TAP BLOCK   TOTAL HIP ARTHROPLASTY Left 12/12/2018   Procedure: LEFT TOTAL HIP ARTHROPLASTY ANTERIOR APPROACH;  Surgeon: Mcarthur Rossetti, MD;  Location: [facility];  Service: Orthopedics;  Laterality: Left;  WISDOM TOOTH EXTRACTION       Current Outpatient Medications  Medication Sig Dispense Refill   acetaminophen (TYLENOL) 500 MG tablet Take 1,000 mg by mouth every 6 (six) hours as needed (pain).     ascorbic acid (VITAMIN C) 500 MG tablet Take 500 mg by mouth daily.     benzonatate (TESSALON) 100 MG capsule Take 100 mg by mouth 3 (three) times daily as  needed.     calcium carbonate (TUMS - DOSED IN MG ELEMENTAL CALCIUM) 500 MG chewable tablet Chew 1-2 tablets by mouth 2 (two) times daily as needed for indigestion or heartburn.     cholecalciferol (VITAMIN D3) 25 MCG (1000 UNIT) tablet Take by mouth. dAILY     diltiazem (CARDIZEM CD) 180 MG 24 hr capsule TAKE ONE CAPSULE BY MOUTH IN THE EVENING 90 capsule 2   dofetilide (TIKOSYN) 500 MCG capsule TAKE ONE CAPSULE BY MOUTH TWICE DAILY 180 capsule 3   ELIQUIS 5 MG TABS tablet TAKE ONE TABLET BY MOUTH TWICE DAILY 60 tablet 6   famotidine (PEPCID) 20 MG tablet Take 20 mg by mouth daily as needed for heartburn or indigestion.     Fexofenadine HCl (MUCINEX ALLERGY PO) Take by mouth. 1 every 12 hours as needed     metoprolol succinate (TOPROL-XL) 25 MG 24 hr tablet Take 0.5 tablets (12.5 mg total) by mouth daily. 135 tablet 3   Multiple Vitamins-Minerals (MULTIVITAMIN ADULTS 50+ PO) Take by mouth.     Multiple Vitamins-Minerals (ZINC PO) Take by mouth. 50 mg daily     Omega-3 Fatty Acids (FISH OIL) 1200 MG CAPS Take 1,200 mg by mouth daily.     oxyCODONE (OXY IR/ROXICODONE) 5 MG immediate release tablet Take 1 tablet (5 mg total) by mouth every 6 (six) hours as needed for severe pain. 15 tablet 0   polyethylene glycol (MIRALAX / GLYCOLAX) 17 g packet Take 17 g by mouth daily. Prn     rosuvastatin (CRESTOR) 10 MG tablet Take 1 tablet (10 mg total) by mouth daily. 90 tablet 3   sildenafil (VIAGRA) 50 MG tablet Take 25 mg by mouth daily as needed for erectile dysfunction.     simethicone (MYLICON) 80 MG chewable tablet Chew 80-160 mg by mouth every 6 (six) hours as needed for flatulence.     valACYclovir (VALTREX) 1000 MG tablet Take 2,000 mg by mouth every 12 (twelve) hours as needed (cold sore).      Zinc Oxide-Vitamin C (ZINC PLUS VITAMIN C PO) Take by mouth.     zolpidem (AMBIEN) 10 MG tablet Take 5 mg by mouth at bedtime as needed for sleep. 1/4 -1/2 tab as needed     Current Facility-Administered  Medications  Medication Dose Route Frequency Provider Last Rate Last Admin   0.9 %  sodium chloride infusion   Intravenous PRN [name], MD       triamcinolone acetonide (KENALOG) 10 MG/ML injection 10 mg  10 mg Other Once Regal, Norman S, DPM        Allergies:   Penicillins   Social History:  The patient  reports that he quit smoking about 20 years ago. His smoking use included cigarettes. He has a 3.50 pack-year smoking history. He has never used smokeless tobacco. He reports current alcohol use of about 3.0 - 4.0 standard drinks of alcohol per week. He reports that he does not use drugs.   Family History:  The patient's family history includes Heart attack in his father; Hypertension in  his father and mother.   ROS:  Please see the history of present illness.   Otherwise, review of systems is positive for none.   All other systems are reviewed and negative.   PHYSICAL EXAM: VS:  BP 130/72   Pulse 62   Ht 6' (1.829 m)   Wt 198 lb (89.8 kg)   SpO2 98%   BMI 26.85 kg/m  , BMI Body mass index is 26.85 kg/m. GEN: Well nourished, well developed, in no acute distress  HEENT: normal  Neck: no JVD, carotid bruits, or masses Cardiac: RRR; no murmurs, rubs, or gallops,no edema  Respiratory:  clear to auscultation bilaterally, normal work of breathing GI: soft, nontender, nondistended, + BS MS: no deformity or atrophy  Skin: warm and dry Neuro:  Strength and sensation are intact Psych: euthymic mood, full affect  EKG:  EKG is ordered today. Personal review of the ekg ordered shows sinus rhythm   Recent Labs: 06/11/2021: ALT 23; BUN 13; Creatinine, Ser 0.91; Hemoglobin 14.4; Magnesium 2.1; Platelets 163; Potassium 3.9; Sodium 135    Lipid Panel     Component Value Date/Time   CHOL 176 08/09/2010 0036   TRIG 234 (H) 08/09/2010 0036   HDL 46 08/09/2010 0036   CHOLHDL 3.8 08/09/2010 0036   VLDL 47 (H) 08/09/2010 0036   LDLCALC 83 08/09/2010 0036     Wt Readings from Last  3 Encounters:  01/28/22 198 lb (89.8 kg)  11/24/21 192 lb 14.4 oz (87.5 kg)  10/14/21 198 lb (89.8 kg)      Other studies Reviewed: Additional studies/ records that were reviewed today include: TTE 2019  Review of the above records today demonstrates: - Left ventricle: The cavity size was at the upper limits of    normal. Systolic function was normal. The estimated ejection    fraction was in the range of 55% to 60%. Wall motion was normal;    there were no regional wall motion abnormalities. The study was    not technically sufficient to allow evaluation of LV diastolic    dysfunction due to atrial fibrillation.  - Aortic valve: Poorly visualized. Trileaflet; normal thickness,    mildly calcified leaflets.  - Mitral valve: Calcified annulus. There was trivial regurgitation.  - Left atrium: The atrium was mildly dilated.  - Tricuspid valve: There was trivial regurgitation.   ASSESSMENT AND PLAN:  1.  Persistent atrial fibrillation/flutter: CHA2DS2-VASc of 2.  Currently on Eliquis 5 mg twice daily, dofetilide 500 mcg twice daily.  High risk medication monitoring.  He is continued to have episodes of atrial fibrillation.  Plan is for ablation 02/04/2022.  All questions were answered.  Ajah Vanhoose plan for labs today.  2.  Hypertension: Currently well-controlled   3.  Secondary to coagula state: Currently on Eliquis for atrial fibrillation as above  Current medicines are reviewed at length with the patient today.   The patient does not have concerns regarding his medicines.  The following changes were made today:  none  Labs/ tests ordered today include:  Orders Placed This Encounter  Procedures   CBC   Basic metabolic panel   EKG 51-VOHY     Disposition:   FU 3 months  Signed, Grove Defina Meredith Leeds, MD  01/28/2022 2:53 PM     [place] [address] [place] [address] [phone] (office) [phone] (fax)

## 2022-01-28 NOTE — Patient Instructions (Signed)
Medication Instructions:  Your physician recommends that you continue on your current medications as directed. Please refer to the Current Medication list given to you today.  *If you need a refill on your cardiac medications before your next appointment, please call your pharmacy*   Lab Work: CBC and BMET today  If you have labs (blood work) drawn today and your tests are completely normal, you will receive your results only by: [place] (if you have MyChart) OR A paper copy in the mail If you have any lab test that is abnormal or we need to change your treatment, we will call you to review the results.   Testing/Procedures: None ordered    Follow-Up: At [facility], you and your health needs are our priority.  As part of our continuing mission to provide you with exceptional heart care, we have created designated Provider Care Teams.  These Care Teams include your primary Cardiologist (physician) and Advanced Practice Providers (APPs -  Physician Assistants and Nurse Practitioners) who all work together to provide you with the care you need, when you need it.  We recommend signing up for the patient portal called "MyChart".  Sign up information is provided on this After Visit Summary.  MyChart is used to connect with patients for Virtual Visits (Telemedicine).  Patients are able to view lab/test results, encounter notes, upcoming appointments, etc.  Non-urgent messages can be sent to your provider as well.   To learn more about what you can do with MyChart, go to [email or web address].    Your next appointment:   As scheduled  Important Information About [place]

## 2022-01-28 NOTE — Progress Notes (Signed)
Electrophysiology Office Note   Date:  01/28/2022   ID:  Emeril, Stille 12/30/1950, MRN [id number]  PCP:  [name], MD  Cardiologist:  Irish Lack Primary Electrophysiologist:  Fifi Schindler Meredith Leeds, MD    Chief Complaint: AF   History of Present Illness: Gary [name] is a 72 y.o. male who is being seen today for the evaluation of AF at the request of [name], MD. Presenting today for electrophysiology evaluation.  He has a history of significant nonischemic cardiomyopathy, hypertension, hyperlipidemia, atrial flutter, atrial fibrillation.  He is on dofetilide.  He is post atrial fibrillation ablation in 2020 with PVI and posterior wall isolation.  He is continued to have episodes of atrial fibrillation.  He has plan for repeat ablation 02/04/2022.  Today, denies symptoms of palpitations, chest pain, shortness of breath, orthopnea, PND, lower extremity edema, claudication, dizziness, presyncope, syncope, bleeding, or neurologic sequela. The patient is tolerating medications without difficulties.  Currently feeling well.  He has no chest pain or shortness of breath.  He has had no further episodes of atrial fibrillation.  He is ready for ablation.   Past Medical History:  Diagnosis Date   Arthritis    Atrial flutter ([place]) 2007   s/p CTI ablation by Dr Lovena Le   Back pain    resolved per pt 12/06/18   Dysrhythmia    afib   Fibromyalgia    GERD (gastroesophageal reflux disease)    Hyperlipidemia    Hypertension    Insomnia    occasional per pt 12/06/18   NICM (nonischemic cardiomyopathy) ([place])    a. 2005 - tachy mediated. normalized after return of NSR.   Obesity    Paroxysmal atrial fibrillation ([facility])    Pneumonia    x 1   Spinal stenosis    Past Surgical History:  Procedure Laterality Date   ATRIAL FIBRILLATION ABLATION N/A 03/22/2018   Procedure: ATRIAL FIBRILLATION ABLATION;  Surgeon: Thompson Grayer, MD;  Location: [facility];  Service:  Cardiovascular;  Laterality: N/A;   atrial flutter ablation  06/10/2005   CTI ablation by Dr Lovena Le for atrial flutter   CARDIOVERSION N/A 07/20/2016   Procedure: CARDIOVERSION;  Surgeon: Minus Breeding, MD;  Location: [facility];  Service: Cardiovascular;  Laterality: N/A;   CARDIOVERSION N/A 12/13/2017   Procedure: CARDIOVERSION;  Surgeon: Thayer Headings, MD;  Location: [facility];  Service: Cardiovascular;  Laterality: N/A;   CARDIOVERSION N/A 01/30/2018   Procedure: CARDIOVERSION;  Surgeon: Fay Records, MD;  Location: [facility];  Service: Cardiovascular;  Laterality: N/A;   CARDIOVERSION N/A 06/27/2018   Procedure: CARDIOVERSION;  Surgeon: Buford Dresser, MD;  Location: [facility];  Service: Cardiovascular;  Laterality: N/A;   CARDIOVERSION N/A 08/16/2018   Procedure: CARDIOVERSION;  Surgeon: [name], MD;  Location: [facility];  Service: Cardiovascular;  Laterality: N/A;   CARDIOVERSION N/A 08/28/2018   Procedure: CARDIOVERSION;  Surgeon: Acie Fredrickson Wonda Cheng, MD;  Location: [facility];  Service: Cardiovascular;  Laterality: N/A;   COLONOSCOPY     EYE SURGERY Bilateral 2019   cataracts removed   INGUINAL HERNIA REPAIR Right 11/24/2021   Procedure: RIGHT INGUINAL HERNIA REPAIR WITH MESH;  Surgeon: Erroll Luna, MD;  Location: [facility];  Service: General;  Laterality: Right;  GENERAL AND TAP BLOCK   TOTAL HIP ARTHROPLASTY Left 12/12/2018   Procedure: LEFT TOTAL HIP ARTHROPLASTY ANTERIOR APPROACH;  Surgeon: Mcarthur Rossetti, MD;  Location: [facility];  Service: Orthopedics;  Laterality: Left;  WISDOM TOOTH EXTRACTION       Current Outpatient Medications  Medication Sig Dispense Refill   acetaminophen (TYLENOL) 500 MG tablet Take 1,000 mg by mouth every 6 (six) hours as needed (pain).     ascorbic acid (VITAMIN C) 500 MG tablet Take 500 mg by mouth daily.     benzonatate (TESSALON) 100 MG capsule Take 100 mg by mouth 3 (three) times daily as  needed.     calcium carbonate (TUMS - DOSED IN MG ELEMENTAL CALCIUM) 500 MG chewable tablet Chew 1-2 tablets by mouth 2 (two) times daily as needed for indigestion or heartburn.     cholecalciferol (VITAMIN D3) 25 MCG (1000 UNIT) tablet Take by mouth. dAILY     diltiazem (CARDIZEM CD) 180 MG 24 hr capsule TAKE ONE CAPSULE BY MOUTH IN THE EVENING 90 capsule 2   dofetilide (TIKOSYN) 500 MCG capsule TAKE ONE CAPSULE BY MOUTH TWICE DAILY 180 capsule 3   ELIQUIS 5 MG TABS tablet TAKE ONE TABLET BY MOUTH TWICE DAILY 60 tablet 6   famotidine (PEPCID) 20 MG tablet Take 20 mg by mouth daily as needed for heartburn or indigestion.     Fexofenadine HCl (MUCINEX ALLERGY PO) Take by mouth. 1 every 12 hours as needed     metoprolol succinate (TOPROL-XL) 25 MG 24 hr tablet Take 0.5 tablets (12.5 mg total) by mouth daily. 135 tablet 3   Multiple Vitamins-Minerals (MULTIVITAMIN ADULTS 50+ PO) Take by mouth.     Multiple Vitamins-Minerals (ZINC PO) Take by mouth. 50 mg daily     Omega-3 Fatty Acids (FISH OIL) 1200 MG CAPS Take 1,200 mg by mouth daily.     oxyCODONE (OXY IR/ROXICODONE) 5 MG immediate release tablet Take 1 tablet (5 mg total) by mouth every 6 (six) hours as needed for severe pain. 15 tablet 0   polyethylene glycol (MIRALAX / GLYCOLAX) 17 g packet Take 17 g by mouth daily. Prn     rosuvastatin (CRESTOR) 10 MG tablet Take 1 tablet (10 mg total) by mouth daily. 90 tablet 3   sildenafil (VIAGRA) 50 MG tablet Take 25 mg by mouth daily as needed for erectile dysfunction.     simethicone (MYLICON) 80 MG chewable tablet Chew 80-160 mg by mouth every 6 (six) hours as needed for flatulence.     valACYclovir (VALTREX) 1000 MG tablet Take 2,000 mg by mouth every 12 (twelve) hours as needed (cold sore).      Zinc Oxide-Vitamin C (ZINC PLUS VITAMIN C PO) Take by mouth.     zolpidem (AMBIEN) 10 MG tablet Take 5 mg by mouth at bedtime as needed for sleep. 1/4 -1/2 tab as needed     Current Facility-Administered  Medications  Medication Dose Route Frequency Provider Last Rate Last Admin   0.9 %  sodium chloride infusion   Intravenous PRN [name], MD       triamcinolone acetonide (KENALOG) 10 MG/ML injection 10 mg  10 mg Other Once Regal, Norman S, DPM        Allergies:   Penicillins   Social History:  The patient  reports that he quit smoking about 20 years ago. His smoking use included cigarettes. He has a 3.50 pack-year smoking history. He has never used smokeless tobacco. He reports current alcohol use of about 3.0 - 4.0 standard drinks of alcohol per week. He reports that he does not use drugs.   Family History:  The patient's family history includes Heart attack in his father; Hypertension in  his father and mother.   ROS:  Please see the history of present illness.   Otherwise, review of systems is positive for none.   All other systems are reviewed and negative.   PHYSICAL EXAM: VS:  BP 130/72   Pulse 62   Ht 6' (1.829 m)   Wt 198 lb (89.8 kg)   SpO2 98%   BMI 26.85 kg/m  , BMI Body mass index is 26.85 kg/m. GEN: Well nourished, well developed, in no acute distress  HEENT: normal  Neck: no JVD, carotid bruits, or masses Cardiac: RRR; no murmurs, rubs, or gallops,no edema  Respiratory:  clear to auscultation bilaterally, normal work of breathing GI: soft, nontender, nondistended, + BS MS: no deformity or atrophy  Skin: warm and dry Neuro:  Strength and sensation are intact Psych: euthymic mood, full affect  EKG:  EKG is ordered today. Personal review of the ekg ordered shows sinus rhythm   Recent Labs: 06/11/2021: ALT 23; BUN 13; Creatinine, Ser 0.91; Hemoglobin 14.4; Magnesium 2.1; Platelets 163; Potassium 3.9; Sodium 135    Lipid Panel     Component Value Date/Time   CHOL 176 08/09/2010 0036   TRIG 234 (H) 08/09/2010 0036   HDL 46 08/09/2010 0036   CHOLHDL 3.8 08/09/2010 0036   VLDL 47 (H) 08/09/2010 0036   LDLCALC 83 08/09/2010 0036     Wt Readings from Last  3 Encounters:  01/28/22 198 lb (89.8 kg)  11/24/21 192 lb 14.4 oz (87.5 kg)  10/14/21 198 lb (89.8 kg)      Other studies Reviewed: Additional studies/ records that were reviewed today include: TTE 2019  Review of the above records today demonstrates: - Left ventricle: The cavity size was at the upper limits of    normal. Systolic function was normal. The estimated ejection    fraction was in the range of 55% to 60%. Wall motion was normal;    there were no regional wall motion abnormalities. The study was    not technically sufficient to allow evaluation of LV diastolic    dysfunction due to atrial fibrillation.  - Aortic valve: Poorly visualized. Trileaflet; normal thickness,    mildly calcified leaflets.  - Mitral valve: Calcified annulus. There was trivial regurgitation.  - Left atrium: The atrium was mildly dilated.  - Tricuspid valve: There was trivial regurgitation.   ASSESSMENT AND PLAN:  1.  Persistent atrial fibrillation/flutter: CHA2DS2-VASc of 2.  Currently on Eliquis 5 mg twice daily, dofetilide 500 mcg twice daily.  High risk medication monitoring.  He is continued to have episodes of atrial fibrillation.  Plan is for ablation 02/04/2022.  All questions were answered.  Ryu Cerreta plan for labs today.  2.  Hypertension: Currently well-controlled   3.  Secondary to coagula state: Currently on Eliquis for atrial fibrillation as above  Current medicines are reviewed at length with the patient today.   The patient does not have concerns regarding his medicines.  The following changes were made today:  none  Labs/ tests ordered today include:  Orders Placed This Encounter  Procedures   CBC   Basic metabolic panel   EKG 03-TDHR     Disposition:   FU 3 months  Signed, Regis Hinton Meredith Leeds, MD  01/28/2022 2:53 PM     [facility] [address] [place] [address] [phone] (office) [phone] (fax)

## 2022-01-29 ENCOUNTER — Telehealth (HOSPITAL_COMMUNITY): Payer: Self-pay | Admitting: *Deleted

## 2022-01-29 LAB — CBC
Hematocrit: 44.4 % (ref 37.5–51.0)
Hemoglobin: 15 g/dL (ref 13.0–17.7)
MCH: 32 pg (ref 26.6–33.0)
MCHC: 33.8 g/dL (ref 31.5–35.7)
MCV: 95 fL (ref 79–97)
Platelets: 186 10*3/uL (ref 150–450)
RBC: 4.69 x10E6/uL (ref 4.14–5.80)
RDW: 12.8 % (ref 11.6–15.4)
WBC: 7.4 10*3/uL (ref 3.4–10.8)

## 2022-01-29 LAB — BASIC METABOLIC PANEL
BUN/Creatinine Ratio: 18 (ref 10–24)
BUN: 15 mg/dL (ref 8–27)
CO2: 25 mmol/L (ref 20–29)
Calcium: 9.5 mg/dL (ref 8.6–10.2)
Chloride: 102 mmol/L (ref 96–106)
Creatinine, Ser: 0.82 mg/dL (ref 0.76–1.27)
Glucose: 100 mg/dL — ABNORMAL HIGH (ref 70–99)
Potassium: 4.6 mmol/L (ref 3.5–5.2)
Sodium: 138 mmol/L (ref 134–144)
eGFR: 93 mL/min/{1.73_m2} (ref >59)

## 2022-01-29 NOTE — Telephone Encounter (Signed)
Reaching out to patient to offer assistance regarding upcoming cardiac imaging study; pt verbalizes understanding of appt date/time, parking situation and where to check in, pre-test NPO status  and verified current allergies; name and call back number provided for further questions should they arise  Gordy Clement RN Navigator [facility] [phone] office [phone] cell  Patient to take daily medications at least two hours prior to appt.

## 2022-02-01 ENCOUNTER — Ambulatory Visit (HOSPITAL_BASED_OUTPATIENT_CLINIC_OR_DEPARTMENT_OTHER)
Admission: RE | Admit: 2022-02-01 | Discharge: 2022-02-01 | Disposition: A | Discharge status: Home / Self Care | Payer: PPO | Source: Ambulatory Visit | Attending: Cardiology | Admitting: Cardiology

## 2022-02-01 ENCOUNTER — Encounter (HOSPITAL_BASED_OUTPATIENT_CLINIC_OR_DEPARTMENT_OTHER): Payer: Self-pay

## 2022-02-01 DIAGNOSIS — I4819 Other persistent atrial fibrillation: Secondary | ICD-10-CM | POA: Diagnosis not present

## 2022-02-01 MED ORDER — IOHEXOL 350 MG/ML SOLN
100.0000 mL | Freq: Once | INTRAVENOUS | Status: AC | PRN
  Administered 2022-02-01: 80 mL via INTRAVENOUS

## 2022-02-03 ENCOUNTER — Telehealth: Payer: Self-pay

## 2022-02-03 NOTE — Telephone Encounter (Signed)
Spoke with patient to review Dr Hassell Done recommendations following patient's Cardiac CT scan result. Patient verbalized understanding but wants to have his annual follow up with Dr Beau Fanny prior to making any changes. He is scheduled for an ablation with Dr. Curt Bears 02/04/22. He will call to schedule his annual follow up with Dr Irish Lack after.

## 2022-02-03 NOTE — Pre-Procedure Instructions (Signed)
Instructed patient on the following items: Arrival time 1100 Nothing to eat or drink after midnight No meds AM of procedure Responsible person to drive you home and stay with you for 24 hrs  Have you missed any doses of anti-coagulant Eliquis- hasn't missed any doses    

## 2022-02-04 ENCOUNTER — Other Ambulatory Visit: Payer: Self-pay

## 2022-02-04 ENCOUNTER — Encounter (HOSPITAL_COMMUNITY)
Admission: RE | Disposition: A | Discharge status: Home / Self Care | Payer: Self-pay | Source: Ambulatory Visit | Attending: Cardiology

## 2022-02-04 ENCOUNTER — Ambulatory Visit (HOSPITAL_COMMUNITY)
Admission: RE | Admit: 2022-02-04 | Discharge: 2022-02-04 | Disposition: A | Discharge status: Home / Self Care | Payer: PPO | Source: Ambulatory Visit | Attending: Cardiology | Admitting: Cardiology

## 2022-02-04 ENCOUNTER — Ambulatory Visit (HOSPITAL_BASED_OUTPATIENT_CLINIC_OR_DEPARTMENT_OTHER): Payer: PPO | Admitting: Certified Registered"

## 2022-02-04 ENCOUNTER — Ambulatory Visit (HOSPITAL_COMMUNITY): Payer: PPO | Admitting: Certified Registered"

## 2022-02-04 DIAGNOSIS — E785 Hyperlipidemia, unspecified: Secondary | ICD-10-CM | POA: Insufficient documentation

## 2022-02-04 DIAGNOSIS — I4891 Unspecified atrial fibrillation: Secondary | ICD-10-CM | POA: Diagnosis not present

## 2022-02-04 DIAGNOSIS — I4892 Unspecified atrial flutter: Secondary | ICD-10-CM | POA: Diagnosis not present

## 2022-02-04 DIAGNOSIS — I110 Hypertensive heart disease with heart failure: Secondary | ICD-10-CM | POA: Diagnosis not present

## 2022-02-04 DIAGNOSIS — I428 Other cardiomyopathies: Secondary | ICD-10-CM | POA: Diagnosis not present

## 2022-02-04 DIAGNOSIS — Z87891 Personal history of nicotine dependence: Secondary | ICD-10-CM | POA: Insufficient documentation

## 2022-02-04 DIAGNOSIS — I4819 Other persistent atrial fibrillation: Secondary | ICD-10-CM | POA: Diagnosis not present

## 2022-02-04 DIAGNOSIS — Z8249 Family history of ischemic heart disease and other diseases of the circulatory system: Secondary | ICD-10-CM | POA: Insufficient documentation

## 2022-02-04 DIAGNOSIS — I509 Heart failure, unspecified: Secondary | ICD-10-CM | POA: Insufficient documentation

## 2022-02-04 DIAGNOSIS — Z7901 Long term (current) use of anticoagulants: Secondary | ICD-10-CM | POA: Insufficient documentation

## 2022-02-04 DIAGNOSIS — Z79899 Other long term (current) drug therapy: Secondary | ICD-10-CM | POA: Diagnosis not present

## 2022-02-04 HISTORY — PX: ATRIAL FIBRILLATION ABLATION: EP1191

## 2022-02-04 LAB — POCT ACTIVATED CLOTTING TIME
Activated Clotting Time: 298 seconds
Activated Clotting Time: 325 seconds

## 2022-02-04 SURGERY — ATRIAL FIBRILLATION ABLATION
Anesthesia: General

## 2022-02-04 MED ORDER — PROTAMINE SULFATE 10 MG/ML IV SOLN
INTRAVENOUS | Status: DC | PRN
  Administered 2022-02-04: 10 mg via INTRAVENOUS
  Administered 2022-02-04: 30 mg via INTRAVENOUS

## 2022-02-04 MED ORDER — SODIUM CHLORIDE 0.9% FLUSH: 3.0000 mL | INTRAVENOUS | Status: DC | PRN

## 2022-02-04 MED ORDER — HEPARIN SODIUM (PORCINE) 1000 UNIT/ML IJ SOLN
INTRAMUSCULAR | Status: DC | PRN
  Administered 2022-02-04: 1000 [IU] via INTRAVENOUS

## 2022-02-04 MED ORDER — LIDOCAINE 2% (20 MG/ML) 5 ML SYRINGE
INTRAMUSCULAR | Status: DC | PRN
  Administered 2022-02-04: 40 mg via INTRAVENOUS

## 2022-02-04 MED ORDER — SUGAMMADEX SODIUM 200 MG/2ML IV SOLN
INTRAVENOUS | Status: DC | PRN
  Administered 2022-02-04: 200 mg via INTRAVENOUS

## 2022-02-04 MED ORDER — HEPARIN SODIUM (PORCINE) 1000 UNIT/ML IJ SOLN
INTRAMUSCULAR | Status: AC
  Filled 2022-02-04: qty 10

## 2022-02-04 MED ORDER — PROPOFOL 10 MG/ML IV BOLUS
INTRAVENOUS | Status: DC | PRN
  Administered 2022-02-04: 10 mg via INTRAVENOUS
  Administered 2022-02-04: 120 mg via INTRAVENOUS

## 2022-02-04 MED ORDER — HEPARIN SODIUM (PORCINE) 1000 UNIT/ML IJ SOLN
INTRAMUSCULAR | Status: DC | PRN
  Administered 2022-02-04: 15000 [IU] via INTRAVENOUS
  Administered 2022-02-04: 4000 [IU] via INTRAVENOUS

## 2022-02-04 MED ORDER — DOBUTAMINE INFUSION FOR EP/ECHO/NUC (1000 MCG/ML)
INTRAVENOUS | Status: AC
  Filled 2022-02-04: qty 250

## 2022-02-04 MED ORDER — HEPARIN (PORCINE) IN NACL 1000-0.9 UT/500ML-% IV SOLN
INTRAVENOUS | Status: AC
  Filled 2022-02-04: qty 500

## 2022-02-04 MED ORDER — SODIUM CHLORIDE 0.9 % IV SOLN: 250.0000 mL | INTRAVENOUS | Status: DC | PRN

## 2022-02-04 MED ORDER — SODIUM CHLORIDE 0.9% FLUSH: 3.0000 mL | Freq: Two times a day (BID) | INTRAVENOUS | Status: DC

## 2022-02-04 MED ORDER — SODIUM CHLORIDE 0.9 % IV SOLN: INTRAVENOUS | Status: DC

## 2022-02-04 MED ORDER — PHENYLEPHRINE HCL-NACL 20-0.9 MG/250ML-% IV SOLN
INTRAVENOUS | Status: DC | PRN
  Administered 2022-02-04: 30 ug/min via INTRAVENOUS

## 2022-02-04 MED ORDER — ONDANSETRON HCL 4 MG/2ML IJ SOLN: 4.0000 mg | Freq: Four times a day (QID) | INTRAMUSCULAR | Status: DC | PRN

## 2022-02-04 MED ORDER — ONDANSETRON HCL 4 MG/2ML IJ SOLN
INTRAMUSCULAR | Status: DC | PRN
  Administered 2022-02-04: 4 mg via INTRAVENOUS

## 2022-02-04 MED ORDER — GLYCOPYRROLATE PF 0.2 MG/ML IJ SOSY
PREFILLED_SYRINGE | INTRAMUSCULAR | Status: DC | PRN
  Administered 2022-02-04: .2 mg via INTRAVENOUS

## 2022-02-04 MED ORDER — FENTANYL CITRATE (PF) 250 MCG/5ML IJ SOLN
INTRAMUSCULAR | Status: DC | PRN
  Administered 2022-02-04: 100 ug via INTRAVENOUS

## 2022-02-04 MED ORDER — HEPARIN (PORCINE) IN NACL 1000-0.9 UT/500ML-% IV SOLN
INTRAVENOUS | Status: DC | PRN
  Administered 2022-02-04 (×4): 500 mL

## 2022-02-04 MED ORDER — PHENYLEPHRINE 80 MCG/ML (10ML) SYRINGE FOR IV PUSH (FOR BLOOD PRESSURE SUPPORT)
PREFILLED_SYRINGE | INTRAVENOUS | Status: DC | PRN
  Administered 2022-02-04: 160 ug via INTRAVENOUS

## 2022-02-04 MED ORDER — DOBUTAMINE INFUSION FOR EP/ECHO/NUC (1000 MCG/ML)
INTRAVENOUS | Status: DC | PRN
  Administered 2022-02-04: 20 ug/kg/min via INTRAVENOUS

## 2022-02-04 MED ORDER — ACETAMINOPHEN 325 MG PO TABS
650.0000 mg | ORAL_TABLET | ORAL | Status: DC | PRN
  Administered 2022-02-04: 650 mg via ORAL
  Filled 2022-02-04: qty 2

## 2022-02-04 MED ORDER — ROCURONIUM BROMIDE 10 MG/ML (PF) SYRINGE
PREFILLED_SYRINGE | INTRAVENOUS | Status: DC | PRN
  Administered 2022-02-04: 50 mg via INTRAVENOUS

## 2022-02-04 SURGICAL SUPPLY — 20 items
BAG SNAP BAND KOVER 36X36 (MISCELLANEOUS) IMPLANT
CATH 8FR REPROCESSED SOUNDSTAR (CATHETERS) ×1 IMPLANT
CATH 8FR SOUNDSTAR REPROCESSED (CATHETERS) IMPLANT
CATH ABLAT QDOT MICRO BI TC DF (CATHETERS) IMPLANT
CATH OCTARAY 2.0 F 3-3-3-3-3 (CATHETERS) IMPLANT
CATH PIGTAIL STEERABLE D1 8.7 (WIRE) IMPLANT
CATH S-M CIRCA TEMP PROBE (CATHETERS) IMPLANT
CATH WEB BI DIR CSDF CRV REPRO (CATHETERS) IMPLANT
CLOSURE PERCLOSE PROSTYLE (VASCULAR PRODUCTS) IMPLANT
COVER SWIFTLINK CONNECTOR (BAG) ×1 IMPLANT
PACK EP LATEX FREE (CUSTOM PROCEDURE TRAY) ×1
PACK EP LF (CUSTOM PROCEDURE TRAY) ×1 IMPLANT
PAD DEFIB RADIO PHYSIO CONN (PAD) ×1 IMPLANT
PATCH CARTO3 (PAD) IMPLANT
SHEATH CARTO VIZIGO SM CVD (SHEATH) IMPLANT
SHEATH PINNACLE 7F 10CM (SHEATH) IMPLANT
SHEATH PINNACLE 8F 10CM (SHEATH) IMPLANT
SHEATH PINNACLE 9F 10CM (SHEATH) IMPLANT
SHEATH PROBE COVER 6X72 (BAG) IMPLANT
TUBING SMART ABLATE COOLFLOW (TUBING) IMPLANT

## 2022-02-04 NOTE — Anesthesia Postprocedure Evaluation (Signed)
Anesthesia Post Note  Patient: Gary [name]  Procedure(s) Performed: ATRIAL FIBRILLATION ABLATION     Patient location during evaluation: PACU Anesthesia Type: General Level of consciousness: awake Pain management: pain level controlled Vital Signs Assessment: post-procedure vital signs reviewed and stable Respiratory status: spontaneous breathing, nonlabored ventilation and respiratory function stable Cardiovascular status: blood pressure returned to baseline and stable Postop Assessment: no apparent nausea or vomiting Anesthetic complications: no   There were no known notable events for this encounter.  Last Vitals:  Vitals:   02/04/22 1515 02/04/22 1530  BP: 115/62 123/71  Pulse: 60 62  Resp: 16 13  Temp:    SpO2: 98% 97%    Last Pain:  Vitals:   02/04/22 1441  TempSrc:   PainSc: 5                  Paysen Goza P Anikka Marsan

## 2022-02-04 NOTE — Discharge Instructions (Signed)
Post procedure care instructions No driving for 4 days. No lifting over 5 lbs for 1 week. No vigorous or sexual activity for 1 week. You may return to work/your usual activities on 02/12/22. Keep procedure site clean & dry. If you notice increased pain, swelling, bleeding or pus, call/return!  You may shower after 24 hours, but no soaking in baths/hot tubs/pools for 1 week.    You have an appointment set up with the [facility].  Multiple studies have shown that being followed by a dedicated atrial fibrillation clinic in addition to the standard care you receive from your other physicians improves health. We believe that enrollment in the atrial fibrillation clinic will allow [place] to better care for you.   The phone number to the [facility] is [phone]. The clinic is staffed Monday through Friday from 8:30am to 5pm.  Directions: The clinic is located in the [facility], [place] the hospital at the MAIN ENTRANCE "A", use [address] to the 6th floor.  Registration desk to the right of elevators on 6th floor  If you have any trouble locating the clinic, please don't hesitate to call [phone].

## 2022-02-04 NOTE — Progress Notes (Signed)
Pt ambulated to and from bathroom with no new oozing from bilateral sites

## 2022-02-04 NOTE — Anesthesia Preprocedure Evaluation (Addendum)
Anesthesia Evaluation  Patient identified by MRN, date of birth, ID band Patient awake    Reviewed: Allergy & Precautions, NPO status , Patient's Chart, lab work & pertinent test results  Airway Mallampati: III  TM Distance: >3 FB Neck ROM: Full    Dental no notable dental hx.    Pulmonary former smoker   Pulmonary exam normal        Cardiovascular hypertension, Pt. on home beta blockers +CHF  Normal cardiovascular exam+ dysrhythmias Atrial Fibrillation      Neuro/Psych negative neurological ROS  negative psych ROS   GI/Hepatic negative GI ROS, Neg liver ROS,,,  Endo/Other  negative endocrine ROS    Renal/GU negative Renal ROS     Musculoskeletal  (+) Arthritis ,  Fibromyalgia -  Abdominal   Peds  Hematology  (+) Blood dyscrasia (Eliquis)   Anesthesia Other Findings A-fib  Reproductive/Obstetrics                             Anesthesia Physical Anesthesia Plan  ASA: 3  Anesthesia Plan: General   Post-op Pain Management:    Induction: Intravenous  PONV Risk Score and Plan: 2 and Ondansetron, Dexamethasone, Midazolam and Treatment may vary due to age or medical condition  Airway Management Planned: Oral ETT  Additional Equipment:   Intra-op Plan:   Post-operative Plan: Extubation in OR  Informed Consent: I have reviewed the patients History and Physical, chart, labs and discussed the procedure including the risks, benefits and alternatives for the proposed anesthesia with the patient or authorized representative who has indicated his/her understanding and acceptance.     Dental advisory given  Plan Discussed with: CRNA  Anesthesia Plan Comments:        Anesthesia Quick Evaluation

## 2022-02-04 NOTE — Transfer of Care (Signed)
Immediate Anesthesia Transfer of Care Note  Patient: Gary [name]  Procedure(s) Performed: ATRIAL FIBRILLATION ABLATION  Patient Location: PACU  Anesthesia Type:General  Level of Consciousness: awake, drowsy, patient cooperative, and responds to stimulation  Airway & Oxygen Therapy: Patient Spontanous Breathing and Patient connected to nasal cannula oxygen  Post-op Assessment: Report given to RN and Post -op Vital signs reviewed and stable  Post vital signs: Reviewed and stable  Last Vitals:  Vitals Value Taken Time  BP 109/61 02/04/22 1445  Temp 36.7 C 02/04/22 1400  Pulse 60 02/04/22 1448  Resp 11 02/04/22 1448  SpO2 94 % 02/04/22 1448  Vitals shown include unvalidated device data.  Last Pain:  Vitals:   02/04/22 1441  TempSrc:   PainSc: 5          Complications: There were no known notable events for this encounter.

## 2022-02-04 NOTE — Interval H&P Note (Signed)
History and Physical Interval Note:  02/04/2022 11:33 AM  Gary [name]  has presented today for surgery, with the diagnosis of afib.  The various methods of treatment have been discussed with the patient and family. After consideration of risks, benefits and other options for treatment, the patient has consented to  Procedure(s): ATRIAL FIBRILLATION ABLATION (N/A) as a surgical intervention.  The patient's history has been reviewed, patient examined, no change in status, stable for surgery.  I have reviewed the patient's chart and labs.  Questions were answered to the patient's satisfaction.     Allisson Schindel [employer]

## 2022-02-04 NOTE — Anesthesia Procedure Notes (Signed)
Procedure Name: Intubation Date/Time: 02/04/2022 12:19 PM  Performed by: Cathren Harsh, CRNAPre-anesthesia Checklist: Patient identified, Emergency Drugs available, Suction available and Patient being monitored Patient Re-evaluated:Patient Re-evaluated prior to induction Oxygen Delivery Method: Circle System Utilized Preoxygenation: Pre-oxygenation with 100% oxygen Induction Type: IV induction Ventilation: Mask ventilation without difficulty Laryngoscope Size: Mac and 4 Grade View: Grade I Tube type: Oral Tube size: 7.5 mm Number of attempts: 1 Airway Equipment and Method: Stylet and Oral airway Placement Confirmation: ETT inserted through vocal cords under direct vision, positive ETCO2 and breath sounds checked- equal and bilateral Secured at: 23 cm Tube secured with: Tape Dental Injury: Teeth and Oropharynx as per pre-operative assessment

## 2022-02-05 ENCOUNTER — Encounter (HOSPITAL_COMMUNITY): Payer: Self-pay | Admitting: Cardiology

## 2022-02-23 DIAGNOSIS — D225 Melanocytic nevi of trunk: Secondary | ICD-10-CM | POA: Diagnosis not present

## 2022-02-23 DIAGNOSIS — L570 Actinic keratosis: Secondary | ICD-10-CM | POA: Diagnosis not present

## 2022-02-23 DIAGNOSIS — X32XXXD Exposure to sunlight, subsequent encounter: Secondary | ICD-10-CM | POA: Diagnosis not present

## 2022-02-23 DIAGNOSIS — Z08 Encounter for follow-up examination after completed treatment for malignant neoplasm: Secondary | ICD-10-CM | POA: Diagnosis not present

## 2022-02-23 DIAGNOSIS — Z85828 Personal history of other malignant neoplasm of skin: Secondary | ICD-10-CM | POA: Diagnosis not present

## 2022-03-04 ENCOUNTER — Encounter (HOSPITAL_COMMUNITY): Payer: Self-pay | Admitting: *Deleted

## 2022-03-04 ENCOUNTER — Ambulatory Visit (HOSPITAL_COMMUNITY)
Admission: RE | Admit: 2022-03-04 | Discharge: 2022-03-04 | Disposition: A | Discharge status: Home / Self Care | Payer: PPO | Source: Ambulatory Visit | Attending: Physician Assistant | Admitting: Physician Assistant

## 2022-03-04 ENCOUNTER — Encounter (HOSPITAL_COMMUNITY): Payer: Self-pay | Admitting: Physician Assistant

## 2022-03-04 VITALS — BP 118/60 | HR 64 | Ht 72.0 in | Wt 198.8 lb

## 2022-03-04 DIAGNOSIS — I4892 Unspecified atrial flutter: Secondary | ICD-10-CM | POA: Insufficient documentation

## 2022-03-04 DIAGNOSIS — I2510 Atherosclerotic heart disease of native coronary artery without angina pectoris: Secondary | ICD-10-CM | POA: Insufficient documentation

## 2022-03-04 DIAGNOSIS — I428 Other cardiomyopathies: Secondary | ICD-10-CM | POA: Diagnosis not present

## 2022-03-04 DIAGNOSIS — Z5181 Encounter for therapeutic drug level monitoring: Secondary | ICD-10-CM

## 2022-03-04 DIAGNOSIS — E785 Hyperlipidemia, unspecified: Secondary | ICD-10-CM | POA: Diagnosis not present

## 2022-03-04 DIAGNOSIS — Z7901 Long term (current) use of anticoagulants: Secondary | ICD-10-CM | POA: Insufficient documentation

## 2022-03-04 DIAGNOSIS — Z79899 Other long term (current) drug therapy: Secondary | ICD-10-CM | POA: Diagnosis not present

## 2022-03-04 DIAGNOSIS — D6869 Other thrombophilia: Secondary | ICD-10-CM | POA: Insufficient documentation

## 2022-03-04 DIAGNOSIS — I4819 Other persistent atrial fibrillation: Secondary | ICD-10-CM | POA: Insufficient documentation

## 2022-03-04 DIAGNOSIS — I10 Essential (primary) hypertension: Secondary | ICD-10-CM | POA: Insufficient documentation

## 2022-03-04 NOTE — Progress Notes (Signed)
Primary Care Physician: [name], MD Primary Cardiologist: Dr Irish Lack  Primary Electrophysiologist: Dr Curt Bears  Referring Physician: Dr Monica Martinez is a 72 y.o. male with a history of NICM, HTN, HLD, atrial flutter, and atrial fibrillation who presents for follow up in the [facility]. Patient is on Eliquis for a CHADS2VASC score of 2. He has been maintained on dofetilide for his afib and is s/p afib ablation 02/2018. He has had brief (10-15 minute) episodes of afib since ablation but on 04/23/20 he noted a persistently elevated heart rate. He took an extra dose of his BB but this did not slow his heart rate. He was set up for DCCV but spontaneously converted to SR.   On follow up today, patient is s/p repeat ablation with Dr Curt Bears on 02/04/22. He reports that he has done well since the procedure. He did have one possible episode of afib but it was so brief that he was not sure it was afib. He denies chest pain, swallowing pain, or groin issues.   Today, he denies symptoms of palpitations, chest pain, orthopnea, PND, lower extremity edema, dizziness, presyncope, syncope, snoring, daytime somnolence, bleeding, or neurologic sequela. The patient is tolerating medications without difficulties and is otherwise without complaint today.    Atrial Fibrillation Risk Factors:  he does not have symptoms or diagnosis of sleep apnea.. he does not have a history of rheumatic fever.   he has a BMI of Body mass index is 26.96 kg/m.[name] Filed Weights   03/04/22 1328  Weight: 90.2 kg    Family History  Problem Relation Age of Onset   Heart attack Father    Hypertension Father    Hypertension Mother      Atrial Fibrillation Management history:  Previous antiarrhythmic drugs: dofetilide Previous cardioversions: 2018, 2019, 2020 x 4 Previous ablations: 02/2018, 02/04/22 CHADS2VASC score: 2 Anticoagulation history: Eliquis   Past Medical History:   Diagnosis Date   Arthritis    Atrial flutter ([place]) 2007   s/p CTI ablation by Dr Lovena Le   Back pain    resolved per pt 12/06/18   Dysrhythmia    afib   Fibromyalgia    GERD (gastroesophageal reflux disease)    Hyperlipidemia    Hypertension    Insomnia    occasional per pt 12/06/18   NICM (nonischemic cardiomyopathy) ([place])    a. 2005 - tachy mediated. normalized after return of NSR.   Obesity    Paroxysmal atrial fibrillation ([facility])    Pneumonia    x 1   Spinal stenosis    Past Surgical History:  Procedure Laterality Date   ATRIAL FIBRILLATION ABLATION N/A 03/22/2018   Procedure: ATRIAL FIBRILLATION ABLATION;  Surgeon: Thompson Grayer, MD;  Location: [place] [facility];  Service: Cardiovascular;  Laterality: N/A;   ATRIAL FIBRILLATION ABLATION N/A 02/04/2022   Procedure: ATRIAL FIBRILLATION ABLATION;  Surgeon: Constance Haw, MD;  Location: [place] [facility];  Service: Cardiovascular;  Laterality: N/A;   atrial flutter ablation  06/10/2005   CTI ablation by Dr Lovena Le for atrial flutter   CARDIOVERSION N/A 07/20/2016   Procedure: CARDIOVERSION;  Surgeon: Minus Breeding, MD;  Location: [facility] ENDOSCOPY;  Service: Cardiovascular;  Laterality: N/A;   CARDIOVERSION N/A 12/13/2017   Procedure: CARDIOVERSION;  Surgeon: Thayer Headings, MD;  Location: [facility] ENDOSCOPY;  Service: Cardiovascular;  Laterality: N/A;   CARDIOVERSION N/A 01/30/2018   Procedure: CARDIOVERSION;  Surgeon: Fay Records, MD;  Location:  [place] ENDOSCOPY;  Service: Cardiovascular;  Laterality: N/A;   CARDIOVERSION N/A 06/27/2018   Procedure: CARDIOVERSION;  Surgeon: Buford Dresser, MD;  Location: [place];  Service: Cardiovascular;  Laterality: N/A;   CARDIOVERSION N/A 08/16/2018   Procedure: CARDIOVERSION;  Surgeon: [name], MD;  Location: [place];  Service: Cardiovascular;  Laterality: N/A;   CARDIOVERSION N/A 08/28/2018   Procedure: CARDIOVERSION;  Surgeon: Thayer Headings, MD;  Location: [facility]  ENDOSCOPY;  Service: Cardiovascular;  Laterality: N/A;   COLONOSCOPY     EYE SURGERY Bilateral 2019   cataracts removed   INGUINAL HERNIA REPAIR Right 11/24/2021   Procedure: RIGHT INGUINAL HERNIA REPAIR WITH MESH;  Surgeon: Erroll Luna, MD;  Location: [place];  Service: General;  Laterality: Right;  GENERAL AND TAP BLOCK   TOTAL HIP ARTHROPLASTY Left 12/12/2018   Procedure: LEFT TOTAL HIP ARTHROPLASTY ANTERIOR APPROACH;  Surgeon: Mcarthur Rossetti, MD;  Location: [place];  Service: Orthopedics;  Laterality: Left;   WISDOM TOOTH EXTRACTION      Current Outpatient Medications  Medication Sig Dispense Refill   acetaminophen (TYLENOL) 500 MG tablet Take 1,000 mg by mouth every 6 (six) hours as needed (pain).     ascorbic acid (VITAMIN C) 500 MG tablet Take 500 mg by mouth daily.     benzonatate (TESSALON) 100 MG capsule Take 100 mg by mouth 3 (three) times daily as needed for cough.     calcium carbonate (TUMS - DOSED IN MG ELEMENTAL CALCIUM) 500 MG chewable tablet Chew 1-2 tablets by mouth 2 (two) times daily as needed for indigestion or heartburn.     cetirizine (ZYRTEC) 10 MG tablet Take 10 mg by mouth daily as needed for allergies.     cholecalciferol (VITAMIN D3) 25 MCG (1000 UNIT) tablet Take 1,000 Units by mouth daily.     diltiazem (CARDIZEM CD) 180 MG 24 hr capsule TAKE ONE CAPSULE BY MOUTH IN THE EVENING 90 capsule 2   dofetilide (TIKOSYN) 500 MCG capsule TAKE ONE CAPSULE BY MOUTH TWICE DAILY 180 capsule 3   ELIQUIS 5 MG TABS tablet TAKE ONE TABLET BY MOUTH TWICE DAILY 60 tablet 6   famotidine (PEPCID) 20 MG tablet Take 20 mg by mouth daily as needed for heartburn or indigestion.     guaiFENesin (MUCINEX) 600 MG 12 hr tablet Take 600 mg by mouth 2 (two) times daily as needed for cough or to loosen phlegm.     metoprolol succinate (TOPROL-XL) 25 MG 24 hr tablet Take 0.5 tablets (12.5 mg total) by mouth daily. 135 tablet 3   Multiple Vitamins-Minerals  (MULTIVITAMIN ADULTS 50+ PO) Take 1 tablet by mouth daily.     Omega-3 Fatty Acids (FISH OIL) 1200 MG CAPS Take 1,200 mg by mouth daily.     oxyCODONE (OXY IR/ROXICODONE) 5 MG immediate release tablet Take 1 tablet (5 mg total) by mouth every 6 (six) hours as needed for severe pain. 15 tablet 0   polyethylene glycol (MIRALAX / GLYCOLAX) 17 g packet Take 17 g by mouth daily.     rosuvastatin (CRESTOR) 10 MG tablet Take 1 tablet (10 mg total) by mouth daily. 90 tablet 3   sildenafil (VIAGRA) 50 MG tablet Take 25 mg by mouth daily as needed for erectile dysfunction.     simethicone (MYLICON) 80 MG chewable tablet Chew 80-160 mg by mouth every 6 (six) hours as needed for flatulence.     sodium chloride (OCEAN) 0.65 % SOLN nasal spray Place 1 spray into  both nostrils as needed for congestion.     valACYclovir (VALTREX) 1000 MG tablet Take 2,000 mg by mouth every 12 (twelve) hours as needed (cold sore).      zinc gluconate 50 MG tablet Take 50 mg by mouth daily.     zolpidem (AMBIEN) 10 MG tablet Take 2.5-5 mg by mouth at bedtime as needed for sleep.     Current Facility-Administered Medications  Medication Dose Route Frequency Provider Last Rate Last Admin   0.9 %  sodium chloride infusion   Intravenous PRN [name], MD       triamcinolone acetonide (KENALOG) 10 MG/ML injection 10 mg  10 mg Other Once Regal, Norman S, DPM        Allergies  Allergen Reactions   Penicillins Rash    DID THE REACTION INVOLVE: Swelling of the face/tongue/throat, SOB, or low BP? No Sudden or severe rash/hives, skin peeling, or the inside of the mouth or nose? Unknown Did it require medical treatment? Unknown When did it last happen?      Childhood Allergy If all above answers are "NO", may proceed with cephalosporin use.      Social History   Socioeconomic History   Marital status: Married    Spouse name: Not on file   Number of children: Not on file   Years of education: Not on file   Highest  education level: Not on file  Occupational History   Not on file  Tobacco Use   Smoking status: Former    Packs/day: 0.50    Years: 7.00    Total pack years: 3.50    Types: Cigarettes    Quit date: 2004    Years since quitting: 20.1   Smokeless tobacco: Never   Tobacco comments:    Former smoker 06/03/21  Vaping Use   Vaping Use: Never used  Substance and Sexual Activity   Alcohol use: Yes    Alcohol/week: 3.0 - 4.0 standard drinks of alcohol    Types: 3 - 4 Glasses of wine per week    Comment: since 2020 cut back to 1-2 drinks per week   Drug use: No   Sexual activity: Yes  Other Topics Concern   Not on file  Social History Narrative   Lives in [place]   Retired from [employer] Strain: Not on file  Food Insecurity: Not on file  Transportation Needs: Not on file  Physical Activity: Not on file  Stress: Not on file  Social Connections: Not on file  Intimate Partner Violence: Not on file    ROS- All systems are reviewed and negative except as per the HPI above.  Physical Exam: Vitals:   03/04/22 1328  BP: 118/60  Pulse: 64  Weight: 90.2 kg  Height: 6' (1.829 m)     GEN- The patient is a well appearing male, alert and oriented x 3 today.   HEENT-head normocephalic, atraumatic, sclera clear, conjunctiva pink, hearing intact, trachea midline. Lungs- Clear to ausculation bilaterally, normal work of breathing Heart- Regular rate and rhythm, no murmurs, rubs or gallops  GI- soft, NT, ND, + BS Extremities- no clubbing, cyanosis, or edema MS- no significant deformity or atrophy Skin- no rash or lesion Psych- euthymic mood, full affect Neuro- strength and sensation are intact   Wt Readings from Last 3 Encounters:  03/04/22 90.2 kg  02/04/22 87.1 kg  01/28/22 89.8 kg    EKG today demonstrates  SR, 1st  degree AV block Vent. rate 64 BPM PR interval 220 ms QRS duration 84 ms QT/QTcB 394/406 ms  Echo 11/05/21  demonstrated  EF 70%, normal wall motion  Mild LAE  Epic records are reviewed at length today  CHA2DS2-VASc Score = 3  The patient's score is based upon: CHF History: 0 HTN History: 1 Diabetes History: 0 Stroke History: 0 Vascular Disease History: 1 Age Score: 1 Gender Score: 0       ASSESSMENT AND PLAN: 1. Persistent Atrial Fibrillation/atrial flutter The patient's CHA2DS2-VASc score is 3, indicating a 3.2% annual risk of stroke.   S/p afib ablation 02/2018 with repeat ablation 02/04/22 Patient appears to be maintaining SR. Continue dofetilide 500 mcg BID for now. QT stable.  Recent labs reviewed.  Continue Toprol 12.5 mg daily Continue diltiazem 180 mg daily Continue Eliquis 5 mg BID  2. Secondary Hypercoagulable State (ICD10:  D68.69) The patient is at significant risk for stroke/thromboembolism based upon his CHA2DS2-VASc Score of 3.  Continue Apixaban (Eliquis).   3. HTN Stable, no changes today.  4. CAD CAC score 730 On statin No anginal symptoms.   Follow up with Dr Curt Bears as scheduled.    [facility] [address] [place], [place] [address] [phone] 03/04/2022 1:35 PM

## 2022-03-23 DIAGNOSIS — H18513 Endothelial corneal dystrophy, bilateral: Secondary | ICD-10-CM | POA: Diagnosis not present

## 2022-03-23 DIAGNOSIS — H26492 Other secondary cataract, left eye: Secondary | ICD-10-CM | POA: Diagnosis not present

## 2022-03-23 DIAGNOSIS — Z961 Presence of intraocular lens: Secondary | ICD-10-CM | POA: Diagnosis not present

## 2022-03-23 DIAGNOSIS — H52203 Unspecified astigmatism, bilateral: Secondary | ICD-10-CM | POA: Diagnosis not present

## 2022-03-25 DIAGNOSIS — I2510 Atherosclerotic heart disease of native coronary artery without angina pectoris: Secondary | ICD-10-CM | POA: Insufficient documentation

## 2022-03-25 DIAGNOSIS — I25118 Atherosclerotic heart disease of native coronary artery with other forms of angina pectoris: Secondary | ICD-10-CM | POA: Insufficient documentation

## 2022-03-25 NOTE — Progress Notes (Signed)
Cardiology Office Note:   Date:  03/26/2022  ID:  [name], DOB 11-23-1950, MRN [id number]  Patient Profile:   Non-ischemic cardiomyopathy  Tachycardia mediated >> EF Recovered TTE 11/24/2017: EF 55-60, no RWMA, trivial MR, mild LAE, trivial TR TTE 11/04/21 ([place]): EF 70, mild LAE, RVSP 31 Atrial Flutter s/p ablation in 2007 Persistent atrial fibrillation Dofetilide S/p PVI ablation x 2 - 03/22/18; 02/04/22 Coronary artery disease  Pre PVI Ablation CCTA 02/01/22: CAC score 730 (77th percentile) Myoview 11/04/21 ([place]): no ischemia, EF 67 Hypertension  Hyperlipidemia     History of Present Illness:   Gary [name] is a 72 y.o. male returns for f/u on AFib, CAD, NICM. He was last see by Dr. Irish Lack 01/29/21. He has had recurrent atrial fibrillation since and underwent repeat PVI ablation with Dr. Curt Bears in 01/2022.  He is here alone today.  He is not having chest discomfort, shortness of breath, syncope, orthopnea, leg edema.  His CT scan prior to his ablation did demonstrate an elevated calcium score.  Dr. Irish Lack had recommended increasing his rosuvastatin.  He held off on doing this until he could follow-up today.  He had questions about the rationale behind doing this.  I explained that his elevated calcium score is a marker of risk and being aggressive with lipid reduction will help reduce his risk over time.  We discussed that there is no need to repeat a calcium score to see if the therapy is working.  We will focus on the reduction in his LDL with follow-up lab work.  Review of Systems  Gastrointestinal:  Negative for hematochezia and melena.  Genitourinary:  Negative for hematuria.     Studies Reviewed:    EKG:  n/a    Risk Assessment/Calculations:    CHA2DS2-VASc Score = 3   This indicates a 3.2% annual risk of stroke. The patient's score is based upon: CHF History: 0 HTN History: 1 Diabetes History: 0 Stroke History: 0 Vascular  Disease History: 1 Age Score: 1 Gender Score: 0            Physical Exam:   VS:  BP 124/60   Pulse (!) 53   Ht 6' (1.829 m)   Wt 198 lb 12.8 oz (90.2 kg)   SpO2 98%   BMI 26.96 kg/m    Wt Readings from Last 3 Encounters:  03/26/22 198 lb 12.8 oz (90.2 kg)  03/04/22 198 lb 12.8 oz (90.2 kg)  02/04/22 192 lb (87.1 kg)    Constitutional:      Appearance: Healthy appearance. Not in distress.  Neck:     Vascular: JVD normal.  Pulmonary:     Breath sounds: Normal breath sounds. No wheezing. No rales.  Cardiovascular:     Normal rate. Regular rhythm. Normal S1. Normal S2.      Murmurs: There is no murmur.  Edema:    Peripheral edema absent.  Abdominal:     Palpations: Abdomen is soft.       ASSESSMENT AND PLAN:   Coronary artery disease involving native coronary artery of native heart without angina pectoris Elevated calcium score on preablation CT (77th percentile).  We discussed the importance of aggressive risk factor modification.  He is not having symptoms to suggest angina.  He had a low risk stress test while in [place], [place] in October 2023.  He does not require aspirin therapy as he is on Eliquis.  Increase rosuvastatin to 20 mg daily  as noted.  Follow-up in 1 year.  Persistent atrial fibrillation [facility]) Status post redo ablation in January 2024 with Dr. Curt Bears.  He has been maintaining sinus rhythm since that time.  He remains on dofetilide.  He hopes to come off of this at some point in the future.  He has follow-up with Dr. Curt Bears in April.  Continue Eliquis 5 mg twice daily (age <80, weight >60 kg, creatinine <1.5).  Continue dofetilide 500 mcg twice daily, Toprol-XL 12.5 mg daily.  Arrange CMET, CBC in 3 months.  Follow-up with Dr. Curt Bears as planned.  Cardiomyopathy Nonischemic cardiomyopathy secondary to tachycardia in the setting of atrial fibrillation.  His EF returned to normal with restoration of sinus rhythm.  Most recent echocardiogram while in [place],  [place] in October 2023 demonstrated EF 70%.  Hyperlipidemia As noted, he will increase his rosuvastatin to 20 mg daily.  Arrange fasting CMET, lipids in 3 months.         Dispo:  Return in about 1 year (around 03/26/2023) for Routine Follow Up with Dr. Irish Lack. Signed, Richardson Dopp, PA-C

## 2022-03-26 ENCOUNTER — Ambulatory Visit: Payer: PPO | Attending: Physician Assistant | Admitting: Physician Assistant

## 2022-03-26 ENCOUNTER — Encounter: Payer: Self-pay | Admitting: Physician Assistant

## 2022-03-26 VITALS — BP 124/60 | HR 53 | Ht 72.0 in | Wt 198.8 lb

## 2022-03-26 DIAGNOSIS — E782 Mixed hyperlipidemia: Secondary | ICD-10-CM | POA: Diagnosis not present

## 2022-03-26 DIAGNOSIS — I428 Other cardiomyopathies: Secondary | ICD-10-CM

## 2022-03-26 DIAGNOSIS — I2510 Atherosclerotic heart disease of native coronary artery without angina pectoris: Secondary | ICD-10-CM | POA: Diagnosis not present

## 2022-03-26 DIAGNOSIS — I4819 Other persistent atrial fibrillation: Secondary | ICD-10-CM

## 2022-03-26 MED ORDER — ROSUVASTATIN CALCIUM 20 MG PO TABS: 20.0000 mg | ORAL_TABLET | Freq: Every day | ORAL | 3 refills | Status: DC

## 2022-03-26 NOTE — Assessment & Plan Note (Addendum)
Elevated calcium score on preablation CT (77th percentile).  We discussed the importance of aggressive risk factor modification.  He is not having symptoms to suggest angina.  He had a low risk stress test while in [place], [place] in October 2023.  He does not require aspirin therapy as he is on Eliquis.  Increase rosuvastatin to 20 mg daily as noted.  Follow-up in 1 year.

## 2022-03-26 NOTE — Assessment & Plan Note (Signed)
Nonischemic cardiomyopathy secondary to tachycardia in the setting of atrial fibrillation.  His EF returned to normal with restoration of sinus rhythm.  Most recent echocardiogram while in [place], [place] in October 2023 demonstrated EF 70%.

## 2022-03-26 NOTE — Patient Instructions (Signed)
Medication Instructions:  Your physician has recommended you make the following change in your medication:   INCREASE the Rosuvastatin to 20 mg taking 1 daily   *If you need a refill on your cardiac medications before your next appointment, please call your pharmacy*   Lab Work: 06/25/22: YOU CAN COME ANYTIME BETWEEN 7;15 - 4:45, COME FASTING FOR:  CMET, LIPID, & CBC  If you have labs (blood work) drawn today and your tests are completely normal, you will receive your results only by: [place] (if you have MyChart) OR A paper copy in the mail If you have any lab test that is abnormal or we need to change your treatment, we will call you to review the results.   Testing/Procedures: None ordered   Follow-Up: At [facility], you and your health needs are our priority.  As part of our continuing mission to provide you with exceptional heart care, we have created designated Provider Care Teams.  These Care Teams include your primary Cardiologist (physician) and Advanced Practice Providers (APPs -  Physician Assistants and Nurse Practitioners) who all work together to provide you with the care you need, when you need it.  We recommend signing up for the patient portal called "MyChart".  Sign up information is provided on this After Visit Summary.  MyChart is used to connect with patients for Virtual Visits (Telemedicine).  Patients are able to view lab/test results, encounter notes, upcoming appointments, etc.  Non-urgent messages can be sent to your provider as well.   To learn more about what you can do with MyChart, go to [email or web address].    Your next appointment:   1 year(s)  Provider:   Larae Grooms, MD     Other Instructions

## 2022-03-26 NOTE — Assessment & Plan Note (Signed)
As noted, he will increase his rosuvastatin to 20 mg daily.  Arrange fasting CMET, lipids in 3 months.

## 2022-03-26 NOTE — Assessment & Plan Note (Addendum)
Status post redo ablation in January 2024 with Dr. Curt Bears.  He has been maintaining sinus rhythm since that time.  He remains on dofetilide.  He hopes to come off of this at some point in the future.  He has follow-up with Dr. Curt Bears in April.  Continue Eliquis 5 mg twice daily (age <80, weight >60 kg, creatinine <1.5).  Continue dofetilide 500 mcg twice daily, Toprol-XL 12.5 mg daily.  Arrange CMET, CBC in 3 months.  Follow-up with Dr. Curt Bears as planned.

## 2022-03-30 ENCOUNTER — Other Ambulatory Visit (HOSPITAL_COMMUNITY): Payer: Self-pay | Admitting: Physician Assistant

## 2022-03-30 DIAGNOSIS — I480 Paroxysmal atrial fibrillation: Secondary | ICD-10-CM

## 2022-04-01 DIAGNOSIS — I4891 Unspecified atrial fibrillation: Secondary | ICD-10-CM | POA: Diagnosis not present

## 2022-04-01 DIAGNOSIS — I119 Hypertensive heart disease without heart failure: Secondary | ICD-10-CM | POA: Diagnosis not present

## 2022-04-01 DIAGNOSIS — E782 Mixed hyperlipidemia: Secondary | ICD-10-CM | POA: Diagnosis not present

## 2022-04-01 DIAGNOSIS — Z125 Encounter for screening for malignant neoplasm of prostate: Secondary | ICD-10-CM | POA: Diagnosis not present

## 2022-04-01 DIAGNOSIS — Z0000 Encounter for general adult medical examination without abnormal findings: Secondary | ICD-10-CM | POA: Diagnosis not present

## 2022-04-01 DIAGNOSIS — D6869 Other thrombophilia: Secondary | ICD-10-CM | POA: Diagnosis not present

## 2022-04-01 DIAGNOSIS — R809 Proteinuria, unspecified: Secondary | ICD-10-CM | POA: Diagnosis not present

## 2022-04-01 DIAGNOSIS — I700 Atherosclerosis of aorta: Secondary | ICD-10-CM | POA: Diagnosis not present

## 2022-04-01 DIAGNOSIS — I429 Cardiomyopathy, unspecified: Secondary | ICD-10-CM | POA: Diagnosis not present

## 2022-04-01 DIAGNOSIS — G4700 Insomnia, unspecified: Secondary | ICD-10-CM | POA: Diagnosis not present

## 2022-04-01 DIAGNOSIS — M4800 Spinal stenosis, site unspecified: Secondary | ICD-10-CM | POA: Diagnosis not present

## 2022-04-01 DIAGNOSIS — R7301 Impaired fasting glucose: Secondary | ICD-10-CM | POA: Diagnosis not present

## 2022-04-01 DIAGNOSIS — M109 Gout, unspecified: Secondary | ICD-10-CM | POA: Diagnosis not present

## 2022-04-08 ENCOUNTER — Other Ambulatory Visit: Payer: Self-pay | Admitting: Interventional Cardiology

## 2022-04-08 DIAGNOSIS — I4819 Other persistent atrial fibrillation: Secondary | ICD-10-CM

## 2022-04-08 NOTE — Telephone Encounter (Signed)
Prescription refill request for Eliquis received. Indication: Afib  Last office visit: 03/04/22 Marlene Lard)  Scr: 0.82 (06/11/21)  Age: 72 Weight: 90.2kg  Appropriate dose. Refill sent.

## 2022-05-03 ENCOUNTER — Ambulatory Visit: Payer: PPO | Attending: Cardiology | Admitting: Cardiology

## 2022-05-03 ENCOUNTER — Encounter: Payer: Self-pay | Admitting: Cardiology

## 2022-05-03 VITALS — BP 110/68 | HR 49 | Ht 72.0 in | Wt 198.0 lb

## 2022-05-03 DIAGNOSIS — I10 Essential (primary) hypertension: Secondary | ICD-10-CM | POA: Diagnosis not present

## 2022-05-03 DIAGNOSIS — I4819 Other persistent atrial fibrillation: Secondary | ICD-10-CM | POA: Diagnosis not present

## 2022-05-03 DIAGNOSIS — D6869 Other thrombophilia: Secondary | ICD-10-CM | POA: Diagnosis not present

## 2022-05-03 DIAGNOSIS — Z79899 Other long term (current) drug therapy: Secondary | ICD-10-CM | POA: Diagnosis not present

## 2022-05-03 NOTE — Progress Notes (Signed)
Electrophysiology Office Note   Date:  05/03/2022   ID:  Wyitt, Sudler 02/24/1950, MRN [id number]  PCP:  Lupita Raider, MD  Cardiologist:  Eldridge Dace Primary Electrophysiologist:  Nate Perri Jorja Loa, MD    Chief Complaint: AF   History of Present Illness: AROLDO ROKUSEK is a 72 y.o. male who is being seen today for the evaluation of AF at the request of Lupita Raider, MD. Presenting today for electrophysiology evaluation.  He has a history significant for nonischemic cardiomyopathy, hypertension, hyperlipidemia, atrial fibrillation/flutter.  He is currently on dofetilide.  He had an ablation with PVI and posterior wall isolation in 2020.  He had more episodes of atrial fibrillation and is now status post repeat ablation 02/04/2022.  Today, denies symptoms of palpitations, chest pain, shortness of breath, orthopnea, PND, lower extremity edema, claudication, dizziness, presyncope, syncope, bleeding, or neurologic sequela. The patient is tolerating medications without difficulties.  Since being seen he has done well.  He has had a few episodes of palpitations that are lasted between 30 seconds and a minute.  He feels that he is in atrial fibrillation but checks his pulse after 30 seconds and is back in rhythm.  This happened 3 weeks ago.  He has not had any further these episodes over the last 2 weeks.  Past Medical History:  Diagnosis Date   Arthritis    Atrial flutter 2007   s/p CTI ablation by Dr Ladona Ridgel   Back pain    resolved per pt 12/06/18   Dysrhythmia    afib   Fibromyalgia    GERD (gastroesophageal reflux disease)    Hyperlipidemia    Hypertension    Insomnia    occasional per pt 12/06/18   NICM (nonischemic cardiomyopathy)    a. 2005 - tachy mediated. normalized after return of NSR.   Obesity    Paroxysmal atrial fibrillation    Pneumonia    x 1   Spinal stenosis    Past Surgical History:  Procedure Laterality Date   ATRIAL FIBRILLATION ABLATION N/A  03/22/2018   Procedure: ATRIAL FIBRILLATION ABLATION;  Surgeon: Hillis Range, MD;  Location: [facility] IN[facility]ASIVE CV LAB;  Service: Cardiovascular;  Laterality: N/A;   ATRIAL FIBRILLATION ABLATION N/A 02/04/2022   Procedure: ATRIAL FIBRILLATION ABLATION;  Surgeon: Regan Lemming, MD;  Location: [facility] INVAS[facility] CV LAB;  Service: Cardiovascular;  Laterality: N/A;   atrial flutter ablation  06/10/2005   CTI ablation by Dr Ladona Ridgel for atrial flutter   CARDIOVERSION N/A 07/20/2016   Procedure: CARDIOVERSION;  Surgeon: Rollene Rotunda, MD;  Location: [facility] ENDOSCOPY;  Service: Cardiovascular;  Laterality: N/A;   CARDIOVERSION N/A 12/13/2017   Procedure: CARDIOVERSION;  Surgeon: Vesta Mixer, MD;  Location: [facility] ENDOSCOPY;  Service: Cardiovascular;  Laterality: N/A;   CARDIOVERSION N/A 01/30/2018   Procedure: CARDIOVERSION;  Surgeon: Pricilla Riffle, MD;  Location: [facility] ENDOSCOPY;  Service: Cardiovascular;  Laterality: N/A;   CARDIOVERSION N/A 06/27/2018   Procedure: CARDIOVERSION;  Surgeon: Jodelle Red, MD;  Location: [facility] ENDOSCOPY;  Service: Cardiovascular;  Laterality: N/A;   CARDIOVERSION N/A 08/16/2018   Procedure: CARDIOVERSION;  Surgeon: Chrystie Nose, MD;  Location: [facility] ENDOSCOPY;  Service: Cardiovascular;  Laterality: N/A;   CARDIOVERSION N/A 08/28/2018   Procedure: CARDIOVERSION;  Surgeon: Elease Hashimoto Deloris Ping, MD;  Location: [facility] ENDOSCOPY;  Service: Cardiovascular;  Laterality: N/A;   COLONOSCOPY     EYE SURGERY Bilateral 2019   cataracts removed   INGUINAL HERNIA REPAIR Right 11/24/2021   Procedure: RIGHT INGUINAL  HERNIA REPAIR WITH MESH;  Surgeon: Harriette Bouillon, MD;  Location: [facility];  Service: General;  Laterality: Right;  GENERAL AND TAP BLOCK   TOTAL HIP ARTHROPLASTY Left 12/12/2018   Procedure: LEFT TOTAL HIP ARTHROPLASTY ANTERIOR APPROACH;  Surgeon: Kathryne Hitch, MD;  Location: [facility] OR;  Service: Orthopedics;  Laterality: Left;   WISDOM TOOTH EXTRACTION        Current Outpatient Medications  Medication Sig Dispense Refill   acetaminophen (TYLENOL) 500 MG tablet Take 1,000 mg by mouth every 6 (six) hours as needed (pain).     apixaban (ELIQUIS) 5 MG TABS tablet TAKE ONE TABLET BY MOUTH TWICE DAILY 60 tablet 5   ascorbic acid (VITAMIN C) 500 MG tablet Take 500 mg by mouth daily.     benzonatate (TESSALON) 100 MG capsule Take 100 mg by mouth 3 (three) times daily as needed for cough.     calcium carbonate (TUMS - DOSED IN MG ELEMENTAL CALCIUM) 500 MG chewable tablet Chew 1-2 tablets by mouth 2 (two) times daily as needed for indigestion or heartburn.     cetirizine (ZYRTEC) 10 MG tablet Take 10 mg by mouth daily as needed for allergies.     cholecalciferol (VITAMIN D3) 25 MCG (1000 UNIT) tablet Take 1,000 Units by mouth daily.     diltiazem (CARDIZEM CD) 180 MG 24 hr capsule TAKE ONE CAPSULE BY MOUTH IN THE EVENING 90 capsule 2   dofetilide (TIKOSYN) 500 MCG capsule TAKE ONE CAPSULE BY MOUTH TWICE DAILY 180 capsule 3   famotidine (PEPCID) 20 MG tablet Take 20 mg by mouth daily as needed for heartburn or indigestion.     guaiFENesin (MUCINEX) 600 MG 12 hr tablet Take 600 mg by mouth 2 (two) times daily as needed for cough or to loosen phlegm.     metoprolol succinate (TOPROL-XL) 25 MG 24 hr tablet Take 0.5 tablets (12.5 mg total) by mouth daily. 135 tablet 3   Multiple Vitamins-Minerals (MULTIVITAMIN ADULTS 50+ PO) Take 1 tablet by mouth daily.     Omega-3 Fatty Acids (FISH OIL) 1200 MG CAPS Take 1,200 mg by mouth daily.     oxyCODONE (OXY IR/ROXICODONE) 5 MG immediate release tablet Take 1 tablet (5 mg total) by mouth every 6 (six) hours as needed for severe pain. 15 tablet 0   polyethylene glycol (MIRALAX / GLYCOLAX) 17 g packet Take 17 g by mouth daily.     rosuvastatin (CRESTOR) 20 MG tablet Take 1 tablet (20 mg total) by mouth daily. 90 tablet 3   sildenafil (VIAGRA) 50 MG tablet Take 25 mg by mouth daily as needed for erectile dysfunction.      simethicone (MYLICON) 80 MG chewable tablet Chew 80-160 mg by mouth every 6 (six) hours as needed for flatulence.     sodium chloride (OCEAN) 0.65 % SOLN nasal spray Place 1 spray into both nostrils as needed for congestion.     valACYclovir (VALTREX) 1000 MG tablet Take 2,000 mg by mouth every 12 (twelve) hours as needed (cold sore).      zinc gluconate 50 MG tablet Take 50 mg by mouth daily.     zolpidem (AMBIEN) 10 MG tablet Take 2.5-5 mg by mouth at bedtime as needed for sleep.     Current Facility-Administered Medications  Medication Dose Route Frequency Provider Last Rate Last Admin   0.9 %  sodium chloride infusion   Intravenous PRN Lupita Raider, MD       triamcinolone acetonide (KENALOG) 10 MG/ML  injection 10 mg  10 mg Other Once Lenn Sink, DPM        Allergies:   Penicillins   Social History:  The patient  reports that he quit smoking about 20 years ago. His smoking use included cigarettes. He has a 3.50 pack-year smoking history. He has never used smokeless tobacco. He reports current alcohol use of about 3.0 - 4.0 standard drinks of alcohol per week. He reports that he does not use drugs.   Family History:  The patient's family history includes Heart attack in his father; Hypertension in his father and mother.   ROS:  Please see the history of present illness.   Otherwise, review of systems is positive for none.   All other systems are reviewed and negative.   PHYSICAL EXAM: VS:  BP 110/68   Pulse (!) 49   Ht 6' (1.829 m)   Wt 198 lb (89.8 kg)   SpO2 99%   BMI 26.85 kg/m  , BMI Body mass index is 26.85 kg/m. GEN: Well nourished, well developed, in no acute distress  HEENT: normal  Neck: no JVD, carotid bruits, or masses Cardiac: RRR; no murmurs, rubs, or gallops,no edema  Respiratory:  clear to auscultation bilaterally, normal work of breathing GI: soft, nontender, nondistended, + BS MS: no deformity or atrophy  Skin: warm and dry Neuro:  Strength and  sensation are intact Psych: euthymic mood, full affect  EKG:  EKG is ordered today. Personal review of the ekg ordered shows sinus rhythm   Recent Labs: 06/11/2021: ALT 23; Magnesium 2.1 01/28/2022: BUN 15; Creatinine, Ser 0.82; Hemoglobin 15.0; Platelets 186; Potassium 4.6; Sodium 138    Lipid Panel     Component Value Date/Time   CHOL 176 08/09/2010 0036   TRIG 234 (H) 08/09/2010 0036   HDL 46 08/09/2010 0036   CHOLHDL 3.8 08/09/2010 0036   VLDL 47 (H) 08/09/2010 0036   LDLCALC 83 08/09/2010 0036     Wt Readings from Last 3 Encounters:  05/03/22 198 lb (89.8 kg)  03/26/22 198 lb 12.8 oz (90.2 kg)  03/04/22 198 lb 12.8 oz (90.2 kg)      Other studies Reviewed: Additional studies/ records that were reviewed today include: TTE 2019  Review of the above records today demonstrates: - Left ventricle: The cavity size was at the upper limits of    normal. Systolic function was normal. The estimated ejection    fraction was in the range of 55% to 60%. Wall motion was normal;    there were no regional wall motion abnormalities. The study was    not technically sufficient to allow evaluation of LV diastolic    dysfunction due to atrial fibrillation.  - Aortic valve: Poorly visualized. Trileaflet; normal thickness,    mildly calcified leaflets.  - Mitral valve: Calcified annulus. There was trivial regurgitation.  - Left atrium: The atrium was mildly dilated.  - Tricuspid valve: There was trivial regurgitation.   ASSESSMENT AND PLAN:  1.  Persistent atrial fibrillation/flutter: CHA2DS2-VASc of 2.  Currently on Eliquis and dofetilide.  He is status post ablation 02/04/2022.  Send intermittent palpitations.  He Abraham Entwistle get a cardia mobile for further monitoring.  2.  Hypertension: Currently well-controlled  3.  Secondary hypercoagulable state: Currently on Eliquis for atrial fibrillation  4.  High risk medication monitoring: Currently on dofetilide.  QT interval remains stable.   Recent labs within normal limits.  Meliza Kage check magnesium today   Current medicines are reviewed at length  with the patient today.   The patient does not have concerns regarding his medicines.  The following changes were made today:  none  Labs/ tests ordered today include:  Orders Placed This Encounter  Procedures   Magnesium   EKG 12-Lead     Disposition:   FU 6 months  Signed, Anarie Kalish Jorja LoaMartin Sinjin Amero, MD  05/03/2022 12:24 PM     [facility] [address] [place] [address] [phone] (office) [phone] (fax)

## 2022-05-03 NOTE — Patient Instructions (Addendum)
Medication Instructions:  Your physician recommends that you continue on your current medications as directed. Please refer to the Current Medication list given to you today.  *If you need a refill on your cardiac medications before your next appointment, please call your pharmacy*   Lab Work: Magnesium level today  If you have labs (blood work) drawn today and your tests are completely normal, you will receive your results only by: MyChart Message (if you have MyChart) OR A paper copy in the mail If you have any lab test that is abnormal or we need to change your treatment, we will call you to review the results.   Testing/Procedures: None ordered   Follow-Up: At [facility], you and your health needs are our priority.  As part of our continuing mission to provide you with exceptional heart care, we have created designated Provider Care Teams.  These Care Teams include your primary Cardiologist (physician) and Advanced Practice Providers (APPs -  Physician Assistants and Nurse Practitioners) who all work together to provide you with the care you need, when you need it.  We recommend signing up for the patient portal called "MyChart".  Sign up information is provided on this After Visit Summary.  MyChart is used to connect with patients for Virtual Visits (Telemedicine).  Patients are able to view lab/test results, encounter notes, upcoming appointments, etc.  Non-urgent messages can be sent to your provider as well.   To learn more about what you can do with MyChart, go to [email or web address].    Your next appointment:   6 month(s)  The format for your next appointment:   In Person  Provider:   Loman Brooklyn, MD    Thank you for choosing [facility]!!   Dory Horn, RN [phone]

## 2022-05-04 LAB — MAGNESIUM: Magnesium: 2.3 mg/dL (ref 1.6–2.3)

## 2022-06-25 ENCOUNTER — Ambulatory Visit: Payer: PPO | Attending: Cardiology

## 2022-06-25 DIAGNOSIS — I2510 Atherosclerotic heart disease of native coronary artery without angina pectoris: Secondary | ICD-10-CM

## 2022-06-25 DIAGNOSIS — I4819 Other persistent atrial fibrillation: Secondary | ICD-10-CM | POA: Diagnosis not present

## 2022-06-26 LAB — CBC
Hematocrit: 45.6 % (ref 37.5–51.0)
Hemoglobin: 14.9 g/dL (ref 13.0–17.7)
MCH: 31.9 pg (ref 26.6–33.0)
MCHC: 32.7 g/dL (ref 31.5–35.7)
MCV: 98 fL — ABNORMAL HIGH (ref 79–97)
Platelets: 167 10*3/uL (ref 150–450)
RBC: 4.67 x10E6/uL (ref 4.14–5.80)
RDW: 12.9 % (ref 11.6–15.4)
WBC: 5.9 10*3/uL (ref 3.4–10.8)

## 2022-06-26 LAB — COMPREHENSIVE METABOLIC PANEL
ALT: 21 IU/L (ref 0–44)
AST: 16 IU/L (ref 0–40)
Albumin/Globulin Ratio: 1.8 (ref 1.2–2.2)
Albumin: 4.2 g/dL (ref 3.8–4.8)
Alkaline Phosphatase: 69 IU/L (ref 44–121)
BUN/Creatinine Ratio: 9 — ABNORMAL LOW (ref 10–24)
BUN: 7 mg/dL — ABNORMAL LOW (ref 8–27)
Bilirubin Total: 0.7 mg/dL (ref 0.0–1.2)
CO2: 26 mmol/L (ref 20–29)
Calcium: 9 mg/dL (ref 8.6–10.2)
Chloride: 102 mmol/L (ref 96–106)
Creatinine, Ser: 0.78 mg/dL (ref 0.76–1.27)
Globulin, Total: 2.4 g/dL (ref 1.5–4.5)
Glucose: 88 mg/dL (ref 70–99)
Potassium: 3.8 mmol/L (ref 3.5–5.2)
Sodium: 139 mmol/L (ref 134–144)
Total Protein: 6.6 g/dL (ref 6.0–8.5)
eGFR: 94 mL/min/{1.73_m2} (ref >59)

## 2022-06-26 LAB — LIPID PANEL
Chol/HDL Ratio: 2.5 ratio (ref 0.0–5.0)
Cholesterol, Total: 106 mg/dL (ref 100–199)
HDL: 42 mg/dL (ref >39)
LDL Chol Calc (NIH): 52 mg/dL (ref 0–99)
Triglycerides: 52 mg/dL (ref 0–149)
VLDL Cholesterol Cal: 12 mg/dL (ref 5–40)

## 2022-06-28 ENCOUNTER — Other Ambulatory Visit: Payer: Self-pay

## 2022-06-28 DIAGNOSIS — Z79899 Other long term (current) drug therapy: Secondary | ICD-10-CM

## 2022-07-05 ENCOUNTER — Other Ambulatory Visit: Payer: Self-pay

## 2022-07-05 ENCOUNTER — Ambulatory Visit: Payer: PPO | Attending: Physician Assistant

## 2022-07-05 DIAGNOSIS — I480 Paroxysmal atrial fibrillation: Secondary | ICD-10-CM

## 2022-07-05 DIAGNOSIS — Z79899 Other long term (current) drug therapy: Secondary | ICD-10-CM

## 2022-07-05 MED ORDER — METOPROLOL SUCCINATE ER 25 MG PO TB24: 12.5000 mg | ORAL_TABLET | Freq: Every day | ORAL | 3 refills | Status: DC

## 2022-07-06 LAB — BASIC METABOLIC PANEL
BUN/Creatinine Ratio: 17 (ref 10–24)
BUN: 13 mg/dL (ref 8–27)
CO2: 27 mmol/L (ref 20–29)
Calcium: 9.4 mg/dL (ref 8.6–10.2)
Chloride: 103 mmol/L (ref 96–106)
Creatinine, Ser: 0.78 mg/dL (ref 0.76–1.27)
Glucose: 84 mg/dL (ref 70–99)
Potassium: 4.3 mmol/L (ref 3.5–5.2)
Sodium: 141 mmol/L (ref 134–144)
eGFR: 94 mL/min/{1.73_m2} (ref >59)

## 2022-10-04 DIAGNOSIS — R7301 Impaired fasting glucose: Secondary | ICD-10-CM | POA: Diagnosis not present

## 2022-10-04 DIAGNOSIS — G4700 Insomnia, unspecified: Secondary | ICD-10-CM | POA: Diagnosis not present

## 2022-10-04 DIAGNOSIS — I4891 Unspecified atrial fibrillation: Secondary | ICD-10-CM | POA: Diagnosis not present

## 2022-10-04 DIAGNOSIS — R809 Proteinuria, unspecified: Secondary | ICD-10-CM | POA: Diagnosis not present

## 2022-10-04 DIAGNOSIS — E782 Mixed hyperlipidemia: Secondary | ICD-10-CM | POA: Diagnosis not present

## 2022-10-04 DIAGNOSIS — N529 Male erectile dysfunction, unspecified: Secondary | ICD-10-CM | POA: Diagnosis not present

## 2022-10-04 DIAGNOSIS — M419 Scoliosis, unspecified: Secondary | ICD-10-CM | POA: Diagnosis not present

## 2022-10-04 DIAGNOSIS — I119 Hypertensive heart disease without heart failure: Secondary | ICD-10-CM | POA: Diagnosis not present

## 2022-10-04 LAB — LAB REPORT - SCANNED
A1c: 5.5
EGFR: 96

## 2022-10-07 ENCOUNTER — Other Ambulatory Visit: Payer: Self-pay | Admitting: Interventional Cardiology

## 2022-10-07 DIAGNOSIS — I4819 Other persistent atrial fibrillation: Secondary | ICD-10-CM

## 2022-10-07 NOTE — Telephone Encounter (Signed)
Prescription refill request for Eliquis received. Indication: Afib  Last office visit: 05/03/22 (Camnitz)  Scr: 0.78 (07/05/22)  Age: 72 Weight: 89.8kg  Appropriate dose. Refill sent.

## 2022-12-13 DIAGNOSIS — Z23 Encounter for immunization: Secondary | ICD-10-CM | POA: Diagnosis not present

## 2022-12-28 ENCOUNTER — Other Ambulatory Visit (HOSPITAL_COMMUNITY): Payer: Self-pay | Admitting: Physician Assistant

## 2022-12-28 DIAGNOSIS — I480 Paroxysmal atrial fibrillation: Secondary | ICD-10-CM

## 2023-01-04 ENCOUNTER — Other Ambulatory Visit: Payer: Self-pay | Admitting: Interventional Cardiology

## 2023-01-16 NOTE — Progress Notes (Unsigned)
  Electrophysiology Office Note:   Date:  01/18/2023  ID:  Gary [name], DOB [birth year], MRN [id number]  Primary Cardiologist: Lance Muss, MD Primary Heart Failure: None Electrophysiologist: Torsten Weniger Gary Loa, MD      History of Present Illness:   Gary [name] is a 72 y.o. male with h/o chronic systolic heart failure due to nonischemic cardiomyopathy, hypertension, hyperlipidemia, atrial fibrillation/flutter seen today for routine electrophysiology followup.   Since last being seen in our clinic the patient reports doing quite well.  He has noted no further episodes of atrial fibrillation since his ablation.  He has been doing his daily activities without restriction.  He is interested in stopping dofetilide.  he denies chest pain, palpitations, dyspnea, PND, orthopnea, nausea, vomiting, dizziness, syncope, edema, weight gain, or early satiety.   Review of systems complete and found to be negative unless listed in HPI.   EP Information / Studies Reviewed:    EKG is ordered today. Personal review as below.  EKG Interpretation Date/Time:  Tuesday January 18 2023 10:27:25 EST Ventricular Rate:  51 PR Interval:  228 QRS Duration:  84 QT Interval:  432 QTC Calculation: 398 R Axis:   67  Text Interpretation: Sinus bradycardia with 1st degree A-V block When compared with ECG of 04-Mar-2022 13:29, No significant change was found Confirmed by Gary [name] ([id number]) on 01/18/2023 10:40:37 AM     Risk Assessment/Calculations:    CHA2DS2-VASc Score = 2   This indicates a 2.2% annual risk of stroke. The patient's score is based upon: CHF History: 0 HTN History: 1 Diabetes History: 0 Stroke History: 0 Vascular Disease History: 0 Age Score: 1 Gender Score: 0             Physical Exam:   VS:  BP 124/60 (BP Location: Left Arm, Patient Position: Sitting, Cuff Size: Large)   Pulse (!) 51   Ht 6' (1.829 m)   Wt 198 lb 6.4 oz (90 kg)   SpO2 97%   BMI 26.91 kg/m    Wt  Readings from Last 3 Encounters:  01/18/23 198 lb 6.4 oz (90 kg)  05/03/22 198 lb (89.8 kg)  03/26/22 198 lb 12.8 oz (90.2 kg)     GEN: Well nourished, well developed in no acute distress NECK: No JVD; No carotid bruits CARDIAC: Regular rate and rhythm, no murmurs, rubs, gallops RESPIRATORY:  Clear to auscultation without rales, wheezing or rhonchi  ABDOMEN: Soft, non-tender, non-distended EXTREMITIES:  No edema; No deformity   ASSESSMENT AND PLAN:    1.  Persistent atrial fibrillation/flutter: Currently on dofetilide.  Post ablation 02/04/2022.  He has remained in sinus rhythm.  He has had no further episodes of atrial fibrillation.  [name] stop dofetilide today.  2.  Hypertension: Currently well-controlled  3.  Secondary hypercoagulable state: Currently on Eliquis for atrial fibrillation   Follow up with Afib Clinic in 6 months  Signed, Semone Orlov Gary Loa, MD

## 2023-01-18 ENCOUNTER — Encounter: Payer: Self-pay | Admitting: Cardiology

## 2023-01-18 ENCOUNTER — Ambulatory Visit: Payer: PPO | Attending: Cardiology | Admitting: Cardiology

## 2023-01-18 VITALS — BP 124/60 | HR 51 | Ht 72.0 in | Wt 198.4 lb

## 2023-01-18 DIAGNOSIS — D6869 Other thrombophilia: Secondary | ICD-10-CM | POA: Diagnosis not present

## 2023-01-18 DIAGNOSIS — I4819 Other persistent atrial fibrillation: Secondary | ICD-10-CM | POA: Diagnosis not present

## 2023-01-18 DIAGNOSIS — I4892 Unspecified atrial flutter: Secondary | ICD-10-CM

## 2023-01-18 NOTE — Patient Instructions (Signed)
Medication Instructions:  Your physician has recommended you make the following change in your medication:  STOP Tikosyn  *If you need a refill on your cardiac medications before your next appointment, please call your pharmacy*   Lab Work: None ordered   Testing/Procedures: None ordered   Follow-Up: At [facility], you and your health needs are our priority.  As part of our continuing mission to provide you with exceptional heart care, we have created designated Provider Care Teams.  These Care Teams include your primary Cardiologist (physician) and Advanced Practice Providers (APPs -  Physician Assistants and Nurse Practitioners) who all work together to provide you with the care you need, when you need it.  Your next appointment:   6 month(s)  The format for your next appointment:   In Person  Provider:   You will follow up in the Atrial Fibrillation Clinic located at [facility]. Your provider will be: Clint R. Fenton, PA-C or Lake Bells, PA-C{    Thank you for choosing [facility]!!   Dory Horn, RN [phone]

## 2023-02-20 ENCOUNTER — Encounter (HOSPITAL_BASED_OUTPATIENT_CLINIC_OR_DEPARTMENT_OTHER): Payer: Self-pay | Admitting: Emergency Medicine

## 2023-02-20 ENCOUNTER — Emergency Department (HOSPITAL_BASED_OUTPATIENT_CLINIC_OR_DEPARTMENT_OTHER)
Admission: EM | Admit: 2023-02-20 | Discharge: 2023-02-20 | Disposition: A | Discharge status: Home / Self Care | Payer: PPO | Attending: Emergency Medicine | Admitting: Emergency Medicine

## 2023-02-20 ENCOUNTER — Other Ambulatory Visit: Payer: Self-pay

## 2023-02-20 DIAGNOSIS — T450X5A Adverse effect of antiallergic and antiemetic drugs, initial encounter: Secondary | ICD-10-CM | POA: Insufficient documentation

## 2023-02-20 DIAGNOSIS — R682 Dry mouth, unspecified: Secondary | ICD-10-CM | POA: Insufficient documentation

## 2023-02-20 NOTE — ED Triage Notes (Signed)
Pt to ED from home c/o taking unintentional extra dose of zyrtec at home.  States took 10mg  tablet around 0950 today and another 10mg  tablet at 2150 tonight.  Pt states accidentally thought tablets were 12hr instead of 24hr.  States dry mouth for the last hour, denies other symptoms.

## 2023-02-20 NOTE — Discharge Instructions (Addendum)
The amount of Zyrtec that you took today should not be dangerous.  You may have dry mouth, some difficulty urinating, nausea --but none of these are likely to be severe.  Return if you have more severe symptoms, chest pain, shortness of breath, vomiting.  Consider skipping the dose you would normally take in the morning tomorrow and then restart on Tuesday.

## 2023-02-20 NOTE — ED Notes (Signed)
PA to see patient in triage.

## 2023-02-20 NOTE — ED Provider Notes (Signed)
EMERGENCY DEPARTMENT AT [facility] Provider Note   CSN: [id number] Arrival date & time: 02/20/23  2237     History  Chief Complaint  Patient presents with   Drug Overdose    Gary [name] is a 73 y.o. male.  Patient presents to the emergency department due to concern for taking too much Zyrtec.  Patient took 10 mg Zyrtec this morning.  This was a 24-hour formulation.  He then took another dose tonight thinking that it was a 12-hour formulation.  He denies taking any other medications in excess.  He did take 1 dose of Tylenol tonight.  He has had a dry mouth.  No sedation.  No vomiting or urinary hesitancy.  No chest pain or shortness of breath.  He does take an anticoagulant due to paroxysmal A-fib.       Home Medications Prior to Admission medications   Medication Sig Start Date End Date Taking? Authorizing Provider  acetaminophen (TYLENOL) 500 MG tablet Take 1,000 mg by mouth every 6 (six) hours as needed (pain).    [provider]  ascorbic acid (VITAMIN C) 500 MG tablet Take 500 mg by mouth daily.    [provider]  benzonatate (TESSALON) 100 MG capsule Take 100 mg by mouth 3 (three) times daily as needed for cough. 11/03/21   [provider]  calcium carbonate (TUMS - DOSED IN MG ELEMENTAL CALCIUM) 500 MG chewable tablet Chew 1-2 tablets by mouth 2 (two) times daily as needed for indigestion or heartburn.    [provider]  CARTIA XT 180 MG 24 hr capsule TAKE ONE CAPSULE BY MOUTH IN THE EVENING 12/28/22   Fenton, Clint R, PA  cetirizine (ZYRTEC) 10 MG tablet Take 10 mg by mouth daily as needed for allergies.    [provider]  cholecalciferol (VITAMIN D3) 25 MCG (1000 UNIT) tablet Take 1,000 Units by mouth daily.    [provider]  ELIQUIS 5 MG TABS tablet TAKE ONE TABLET BY MOUTH TWICE DAILY 10/07/22   Camnitz, Andree Coss, MD  famotidine (PEPCID) 20 MG tablet Take 20 mg by mouth daily as needed for  heartburn or indigestion.    [provider]  guaiFENesin (MUCINEX) 600 MG 12 hr tablet Take 600 mg by mouth 2 (two) times daily as needed for cough or to loosen phlegm.    [provider]  LORazepam (ATIVAN) 1 MG tablet Take 1 mg by mouth at bedtime as needed.    [provider]  metoprolol succinate (TOPROL-XL) 25 MG 24 hr tablet Take 0.5 tablets (12.5 mg total) by mouth daily. 07/05/22   Camnitz, Andree Coss, MD  Multiple Vitamins-Minerals (MULTIVITAMIN ADULTS 50+ PO) Take 1 tablet by mouth daily.    [provider]  Omega-3 Fatty Acids (FISH OIL) 1200 MG CAPS Take 1,200 mg by mouth daily.    [provider]  oxyCODONE (OXY IR/ROXICODONE) 5 MG immediate release tablet Take 1 tablet (5 mg total) by mouth every 6 (six) hours as needed for severe pain. 11/24/21   Cornett, Maisie Fus, MD  polyethylene glycol (MIRALAX / GLYCOLAX) 17 g packet Take 17 g by mouth daily.    [provider]  rosuvastatin (CRESTOR) 20 MG tablet Take 1 tablet (20 mg total) by mouth daily. 03/26/22   Tereso Newcomer T, PA-C  sildenafil (VIAGRA) 50 MG tablet Take 25 mg by mouth daily as needed for erectile dysfunction.    [provider]  simethicone (MYLICON) 80 MG  chewable tablet Chew 80-160 mg by mouth every 6 (six) hours as needed for flatulence.    [provider]  sodium chloride (OCEAN) 0.65 % SOLN nasal spray Place 1 spray into both nostrils as needed for congestion.    [provider]  valACYclovir (VALTREX) 1000 MG tablet Take 2,000 mg by mouth every 12 (twelve) hours as needed (cold sore).  12/04/12   [provider]  zinc gluconate 50 MG tablet Take 50 mg by mouth daily.    [provider]  zolpidem (AMBIEN) 10 MG tablet Take 2.5-5 mg by mouth at bedtime as needed for sleep. Patient not taking: Reported on 01/18/2023    [provider]      Allergies    Penicillins    Review of Systems   Review of  Systems  Physical Exam Updated Vital Signs BP (!) 152/82 (BP Location: Right Arm)   Pulse 65   Temp 98.1 F (36.7 C) (Oral)   Resp 16   Ht 6' (1.829 m)   Wt 88.5 kg   SpO2 100%   BMI 26.45 kg/m   Physical Exam Vitals and nursing note reviewed.  Constitutional:      Appearance: He is well-developed.  HENT:     Head: Normocephalic and atraumatic.  Eyes:     Conjunctiva/sclera: Conjunctivae normal.  Cardiovascular:     Comments: Heart normal rate, regular rhythm. Pulmonary:     Effort: No respiratory distress.  Musculoskeletal:     Cervical back: Normal range of motion and neck supple.  Skin:    General: Skin is warm and dry.  Neurological:     Mental Status: He is alert.     ED Results / Procedures / Treatments   Labs (all labs ordered are listed, but only abnormal results are displayed) Labs Reviewed - No data to display  EKG None  Radiology No results found.  Procedures Procedures    Medications Ordered in ED Medications - No data to display  ED Course/ Medical Decision Making/ A&P    Patient seen and examined. History obtained directly from patient.   Labs/EKG: None ordered  Imaging: None ordered  Medications/Fluids: None ordered  Most recent vital signs reviewed and are as follows: BP (!) 152/82 (BP Location: Right Arm)   Pulse 65   Temp 98.1 F (36.7 C) (Oral)   Resp 16   Ht 6' (1.829 m)   Wt 88.5 kg   SpO2 100%   BMI 26.45 kg/m   Initial impression: Accidental overdose of antihistamine  Home treatment plan: Monitor symptoms, skip tomorrow morning's dose  Return instructions discussed with patient: Chest pain, shortness of breath, inability to urinate  Follow-up instructions discussed with patient: PCP as needed                                Medical Decision Making  Patient accidentally took 1 extra tablet of Zyrtec tonight.  He was concerned about things he read online in regards to taking too much.  No other accidental  ingestions.  Currently only dry mouth.  Normal heart rate, otherwise asymptomatic.  I would not expect any adverse reactions from 1 additional 10 mg tablet.  Patient counseled.  Seems reliable to return if worsening, he will do this a few minutes away.         Final Clinical Impression(s) / ED Diagnoses Final diagnoses:  Adverse effect of antihistamine, initial encounter  Rx / DC Orders ED Discharge Orders     None         Renne Crigler, Cordelia Poche 02/20/23 2309    Pollyann Savoy, MD 02/20/23 [phone]

## 2023-02-24 ENCOUNTER — Emergency Department (HOSPITAL_BASED_OUTPATIENT_CLINIC_OR_DEPARTMENT_OTHER): Payer: PPO | Admitting: Radiology

## 2023-02-24 ENCOUNTER — Other Ambulatory Visit: Payer: Self-pay

## 2023-02-24 ENCOUNTER — Emergency Department (HOSPITAL_BASED_OUTPATIENT_CLINIC_OR_DEPARTMENT_OTHER)
Admission: EM | Admit: 2023-02-24 | Discharge: 2023-02-24 | Disposition: A | Discharge status: Home / Self Care | Payer: PPO | Attending: Emergency Medicine | Admitting: Emergency Medicine

## 2023-02-24 DIAGNOSIS — R0789 Other chest pain: Secondary | ICD-10-CM | POA: Insufficient documentation

## 2023-02-24 DIAGNOSIS — Z7901 Long term (current) use of anticoagulants: Secondary | ICD-10-CM | POA: Diagnosis not present

## 2023-02-24 DIAGNOSIS — R5383 Other fatigue: Secondary | ICD-10-CM | POA: Insufficient documentation

## 2023-02-24 DIAGNOSIS — I2510 Atherosclerotic heart disease of native coronary artery without angina pectoris: Secondary | ICD-10-CM | POA: Diagnosis not present

## 2023-02-24 DIAGNOSIS — Z20822 Contact with and (suspected) exposure to covid-19: Secondary | ICD-10-CM | POA: Insufficient documentation

## 2023-02-24 DIAGNOSIS — K219 Gastro-esophageal reflux disease without esophagitis: Secondary | ICD-10-CM | POA: Insufficient documentation

## 2023-02-24 DIAGNOSIS — I700 Atherosclerosis of aorta: Secondary | ICD-10-CM | POA: Diagnosis not present

## 2023-02-24 DIAGNOSIS — R42 Dizziness and giddiness: Secondary | ICD-10-CM | POA: Diagnosis present

## 2023-02-24 DIAGNOSIS — R079 Chest pain, unspecified: Secondary | ICD-10-CM | POA: Insufficient documentation

## 2023-02-24 DIAGNOSIS — I4891 Unspecified atrial fibrillation: Secondary | ICD-10-CM | POA: Diagnosis not present

## 2023-02-24 DIAGNOSIS — I440 Atrioventricular block, first degree: Secondary | ICD-10-CM | POA: Insufficient documentation

## 2023-02-24 DIAGNOSIS — R531 Weakness: Secondary | ICD-10-CM | POA: Insufficient documentation

## 2023-02-24 DIAGNOSIS — J439 Emphysema, unspecified: Secondary | ICD-10-CM | POA: Insufficient documentation

## 2023-02-24 DIAGNOSIS — E871 Hypo-osmolality and hyponatremia: Secondary | ICD-10-CM | POA: Insufficient documentation

## 2023-02-24 LAB — URINALYSIS, ROUTINE W REFLEX MICROSCOPIC
Bacteria, UA: NONE SEEN
Bilirubin Urine: NEGATIVE
Glucose, UA: NEGATIVE mg/dL
Hgb urine dipstick: NEGATIVE
Ketones, ur: NEGATIVE mg/dL
Leukocytes,Ua: NEGATIVE
Nitrite: NEGATIVE
Protein, ur: NEGATIVE mg/dL
Specific Gravity, Urine: 1.016 (ref 1.005–1.030)
pH: 6.5 (ref 5.0–8.0)

## 2023-02-24 LAB — RESP PANEL BY RT-PCR (RSV, FLU A&B, COVID)  RVPGX2
Influenza A by PCR: NEGATIVE
Influenza B by PCR: NEGATIVE
Resp Syncytial Virus by PCR: NEGATIVE
SARS Coronavirus 2 by RT PCR: NEGATIVE

## 2023-02-24 LAB — BASIC METABOLIC PANEL
Anion gap: 5 (ref 5–15)
BUN: 18 mg/dL (ref 8–23)
CO2: 28 mmol/L (ref 22–32)
Calcium: 9.4 mg/dL (ref 8.9–10.3)
Chloride: 100 mmol/L (ref 98–111)
Creatinine, Ser: 0.94 mg/dL (ref 0.61–1.24)
GFR, Estimated: >60 mL/min (ref >60)
Glucose, Bld: 106 mg/dL — ABNORMAL HIGH (ref 70–99)
Potassium: 4 mmol/L (ref 3.5–5.1)
Sodium: 133 mmol/L — ABNORMAL LOW (ref 135–145)

## 2023-02-24 LAB — CBC
HCT: 42.8 % (ref 39.0–52.0)
Hemoglobin: 14.6 g/dL (ref 13.0–17.0)
MCH: 32.6 pg (ref 26.0–34.0)
MCHC: 34.1 g/dL (ref 30.0–36.0)
MCV: 95.5 fL (ref 80.0–100.0)
Platelets: 192 10*3/uL (ref 150–400)
RBC: 4.48 MIL/uL (ref 4.22–5.81)
RDW: 13.6 % (ref 11.5–15.5)
WBC: 8 10*3/uL (ref 4.0–10.5)
nRBC: 0 % (ref 0.0–0.2)

## 2023-02-24 LAB — TROPONIN I (HIGH SENSITIVITY)
Troponin I (High Sensitivity): 3 ng/L (ref <18)
Troponin I (High Sensitivity): 3 ng/L (ref <18)

## 2023-02-24 NOTE — ED Notes (Signed)
Provider at bedside

## 2023-02-24 NOTE — ED Triage Notes (Addendum)
Lightheaded. Heaviness starting around 1330. Flush feeling intermediately-since Saturday.

## 2023-02-24 NOTE — Discharge Instructions (Addendum)
Today you are seen for lightheadedness and fatigue.  No emergent condition was identified for the symptoms.  Please follow-up with your primary care physician if your symptoms persist for further evaluation and treatment.  Thank you for letting us treat you today. After reviewing your labs and imaging, I feel you are safe to go home. Please follow up with your PCP in the next several days and provide them with your records from this visit. Return to the Emergency Room if pain becomes severe or symptoms worsen.

## 2023-02-24 NOTE — ED Provider Notes (Signed)
[place] EMERGENCY DEPARTMENT AT [facility] Provider Note   CSN: [id number] Arrival date & time: 02/24/23  1547     History  Chief Complaint  Patient presents with   Weakness    Gary [name] is a 73 y.o. male past medical history significant for A-fib, CAD, pneumonia and GERD presents today for lightheadedness and fatigue which he describes as "arm heaviness" that began around 1330.  Patient states that he has had a flushed feeling intermittently since Saturday.  Patient has had congestion and postnasal drip for approximately 3 weeks.  Patient denies any chest pain but does endorse chest pressure. Patient denies fever, nausea, vomiting, abdominal pain, diarrhea, headache, cough, or shortness of breath.  Patient does take Eliquis.   Weakness      Home Medications Prior to Admission medications   Medication Sig Start Date End Date Taking? Authorizing Provider  acetaminophen (TYLENOL) 500 MG tablet Take 1,000 mg by mouth every 6 (six) hours as needed (pain).    [provider]  ascorbic acid (VITAMIN C) 500 MG tablet Take 500 mg by mouth daily.    [provider]  benzonatate (TESSALON) 100 MG capsule Take 100 mg by mouth 3 (three) times daily as needed for cough. 11/03/21   [provider]  calcium carbonate (TUMS - DOSED IN MG ELEMENTAL CALCIUM) 500 MG chewable tablet Chew 1-2 tablets by mouth 2 (two) times daily as needed for indigestion or heartburn.    [provider]  CARTIA XT 180 MG 24 hr capsule TAKE ONE CAPSULE BY MOUTH IN THE EVENING 12/28/22   Fenton, Clint R, PA  cetirizine (ZYRTEC) 10 MG tablet Take 10 mg by mouth daily as needed for allergies.    [provider]  cholecalciferol (VITAMIN D3) 25 MCG (1000 UNIT) tablet Take 1,000 Units by mouth daily.    [provider]  ELIQUIS 5 MG TABS tablet TAKE ONE TABLET BY MOUTH TWICE DAILY 10/07/22   Camnitz, Andree Coss, MD  famotidine (PEPCID) 20 MG tablet Take 20  mg by mouth daily as needed for heartburn or indigestion.    [provider]  guaiFENesin (MUCINEX) 600 MG 12 hr tablet Take 600 mg by mouth 2 (two) times daily as needed for cough or to loosen phlegm.    [provider]  LORazepam (ATIVAN) 1 MG tablet Take 1 mg by mouth at bedtime as needed.    [provider]  metoprolol succinate (TOPROL-XL) 25 MG 24 hr tablet Take 0.5 tablets (12.5 mg total) by mouth daily. 07/05/22   Camnitz, Andree Coss, MD  Multiple Vitamins-Minerals (MULTIVITAMIN ADULTS 50+ PO) Take 1 tablet by mouth daily.    [provider]  Omega-3 Fatty Acids (FISH OIL) 1200 MG CAPS Take 1,200 mg by mouth daily.    [provider]  oxyCODONE (OXY IR/ROXICODONE) 5 MG immediate release tablet Take 1 tablet (5 mg total) by mouth every 6 (six) hours as needed for severe pain. 11/24/21   Cornett, Maisie Fus, MD  polyethylene glycol (MIRALAX / GLYCOLAX) 17 g packet Take 17 g by mouth daily.    [provider]  rosuvastatin (CRESTOR) 20 MG tablet Take 1 tablet (20 mg total) by mouth daily. 03/26/22   Tereso Newcomer T, PA-C  sildenafil (VIAGRA) 50 MG tablet Take 25 mg by mouth daily as needed for erectile dysfunction.    [provider]  simethicone (MYLICON) 80 MG chewable tablet Chew 80-160 mg by mouth every 6 (six) hours as needed  for flatulence.    [provider]  sodium chloride (OCEAN) 0.65 % SOLN nasal spray Place 1 spray into both nostrils as needed for congestion.    [provider]  valACYclovir (VALTREX) 1000 MG tablet Take 2,000 mg by mouth every 12 (twelve) hours as needed (cold sore).  12/04/12   [provider]  zinc gluconate 50 MG tablet Take 50 mg by mouth daily.    [provider]  zolpidem (AMBIEN) 10 MG tablet Take 2.5-5 mg by mouth at bedtime as needed for sleep. Patient not taking: Reported on 01/18/2023    [provider]      Allergies    Penicillins    Review of  Systems   Review of Systems  Constitutional:  Positive for fatigue.  HENT:  Positive for congestion and postnasal drip.   Neurological:  Positive for weakness and light-headedness.    Physical Exam Updated Vital Signs BP (!) 148/83   Pulse 64   Temp 98.1 F (36.7 C)   Resp 13   SpO2 100%  Physical Exam Vitals and nursing note reviewed.  Constitutional:      General: He is not in acute distress.    Appearance: Normal appearance. He is well-developed. He is not ill-appearing.  HENT:     Head: Normocephalic and atraumatic.     Right Ear: External ear normal.     Left Ear: External ear normal.     Nose: Congestion present. No rhinorrhea.     Mouth/Throat:     Mouth: Mucous membranes are moist.     Pharynx: Oropharynx is clear.  Eyes:     Extraocular Movements: Extraocular movements intact.     Conjunctiva/sclera: Conjunctivae normal.  Cardiovascular:     Rate and Rhythm: Normal rate and regular rhythm.     Pulses: Normal pulses.     Heart sounds: Normal heart sounds. No murmur heard. Pulmonary:     Effort: Pulmonary effort is normal. No respiratory distress.     Breath sounds: Normal breath sounds. No stridor. No wheezing or rhonchi.  Chest:     Chest wall: No tenderness.  Abdominal:     Palpations: Abdomen is soft.     Tenderness: There is no abdominal tenderness.  Musculoskeletal:        General: No swelling.     Cervical back: Neck supple.     Right lower leg: No edema.     Comments: Patient has equal bilateral grip strength, equal bilateral dorsi flexion and plantarflexion against resistance.  Patient is able to straight leg lift bilateral legs without difficulty.  Patient has +2 radial and dorsalis pedis pulses.  Skin:    General: Skin is warm and dry.     Capillary Refill: Capillary refill takes less than 2 seconds.  Neurological:     General: No focal deficit present.     Mental Status: He is alert.     Cranial Nerves: No cranial nerve deficit.     Sensory:  No sensory deficit.     Motor: No weakness.  Psychiatric:        Mood and Affect: Mood normal.     ED Results / Procedures / Treatments   Labs (all labs ordered are listed, but only abnormal results are displayed) Labs Reviewed  BASIC METABOLIC PANEL - Abnormal; Notable for the following components:      Result Value   Sodium 133 (*)    Glucose, Bld 106 (*)    All other components  within normal limits  RESP PANEL BY RT-PCR (RSV, FLU A&B, COVID)  RVPGX2  CBC  URINALYSIS, ROUTINE W REFLEX MICROSCOPIC  TROPONIN I (HIGH SENSITIVITY)  TROPONIN I (HIGH SENSITIVITY)    EKG EKG Interpretation Date/Time:  Thursday February 24 2023 16:18:04 EST Ventricular Rate:  60 PR Interval:  224 QRS Duration:  86 QT Interval:  380 QTC Calculation: 380 R Axis:   60  Text Interpretation: Sinus rhythm with 1st degree A-V block Otherwise normal ECG When compared with ECG of 18-Jan-2023 10:27, No significant change was found Confirmed by Vonita Moss [phone]) on 02/24/2023 4:43:31 PM  Radiology DG Chest 2 View Result Date: 02/24/2023 CLINICAL DATA:  Dizziness EXAM: CHEST - 2 VIEW COMPARISON:  Chest x-ray 04/06/2018 FINDINGS: The heart and mediastinal contours are unchanged. Atherosclerotic plaque. No focal consolidation. Slightly worsened coarsened interstitial markings with no overt pulmonary edema. No pleural effusion. No pneumothorax. No acute osseous abnormality. IMPRESSION: 1. No active cardiopulmonary disease. 2. Aortic Atherosclerosis (ICD10-I70.0) and Emphysema (ICD10-J43.9). Electronically Signed   By: Tish Frederickson M.D.   On: 02/24/2023 17:40    Procedures Procedures    Medications Ordered in ED Medications - No data to display  ED Course/ Medical Decision Making/ A&P                                 Medical Decision Making Amount and/or Complexity of Data Reviewed Labs: ordered. Radiology: ordered.   This patient presents to the ED with chief complaint(s) of lightheadedness  with pertinent past medical history of A-fib and CAD which further complicates the presenting complaint. The complaint involves an extensive differential diagnosis and also carries with it a high risk of complications and morbidity.    The differential diagnosis includes arrhythmia, ACS, COVID, flu, RSV, URI, pneumonia, electrolyte abnormality,  Additional history obtained: Additional history obtained from spouse Records reviewed cardiology progress notes  ED Course and Reassessment:   Independent labs interpretation:  The following labs were independently interpreted:  CBC: No notable findings BMP: Mild hyponatremia 133 EKG: Sinus with first-degree AV block Troponin: 3, 3 Respiratory panel: Negative UA: No notable findings  Independent visualization of imaging: - I independently visualized the following imaging with scope of interpretation limited to determining acute life threatening conditions related to emergency care: Chest x-ray, which revealed no active cardiopulmonary disease  Consultation: - Consulted or discussed management/test interpretation w/ external professional: None  Consideration for admission or further workup: Consider for mission further workup however patient's vital signs, physical exam, labs, and imaging were reassuring.  No emergent condition was identified as a cause for the patient's lightheadedness.  I suspect that the patient's symptoms could be due to allergies or URI.  Patient should follow-up with his primary care physician for further evaluation if his symptoms persist.        Final Clinical Impression(s) / ED Diagnoses Final diagnoses:  Lightheadedness    Rx / DC Orders ED Discharge Orders     None         Dolphus Jenny, PA-C 02/24/23 1911    Rondel Baton, MD 02/28/23 [phone]

## 2023-03-07 DIAGNOSIS — R002 Palpitations: Secondary | ICD-10-CM | POA: Diagnosis not present

## 2023-03-09 NOTE — Progress Notes (Signed)
To test for infection

## 2023-03-25 DIAGNOSIS — R002 Palpitations: Secondary | ICD-10-CM | POA: Diagnosis not present

## 2023-04-04 DIAGNOSIS — I4891 Unspecified atrial fibrillation: Secondary | ICD-10-CM | POA: Diagnosis not present

## 2023-04-04 DIAGNOSIS — G4700 Insomnia, unspecified: Secondary | ICD-10-CM | POA: Diagnosis not present

## 2023-04-04 DIAGNOSIS — E782 Mixed hyperlipidemia: Secondary | ICD-10-CM | POA: Diagnosis not present

## 2023-04-04 DIAGNOSIS — M4800 Spinal stenosis, site unspecified: Secondary | ICD-10-CM | POA: Diagnosis not present

## 2023-04-04 DIAGNOSIS — Z0000 Encounter for general adult medical examination without abnormal findings: Secondary | ICD-10-CM | POA: Diagnosis not present

## 2023-04-04 DIAGNOSIS — R7301 Impaired fasting glucose: Secondary | ICD-10-CM | POA: Diagnosis not present

## 2023-04-04 DIAGNOSIS — M109 Gout, unspecified: Secondary | ICD-10-CM | POA: Diagnosis not present

## 2023-04-04 DIAGNOSIS — R809 Proteinuria, unspecified: Secondary | ICD-10-CM | POA: Diagnosis not present

## 2023-04-04 DIAGNOSIS — I471 Supraventricular tachycardia, unspecified: Secondary | ICD-10-CM | POA: Diagnosis not present

## 2023-04-04 DIAGNOSIS — Z125 Encounter for screening for malignant neoplasm of prostate: Secondary | ICD-10-CM | POA: Diagnosis not present

## 2023-04-04 DIAGNOSIS — I119 Hypertensive heart disease without heart failure: Secondary | ICD-10-CM | POA: Diagnosis not present

## 2023-04-04 DIAGNOSIS — I429 Cardiomyopathy, unspecified: Secondary | ICD-10-CM | POA: Diagnosis not present

## 2023-04-04 LAB — LAB REPORT - SCANNED
A1c: 5.4
Creatinine, POC: 106 mg/dL
EGFR: 91

## 2023-04-10 NOTE — Progress Notes (Unsigned)
 Electrophysiology Office Note:   Date:  04/11/2023  ID:  Gary [name], DOB Dec 26, 1950, MRN [id number]  Primary Cardiologist: Lance Muss, MD Primary Heart Failure: None Electrophysiologist: Will Jorja Loa, MD      History of Present Illness:   Gary [name] is a 73 y.o. male with h/o AF / AFL, HFrEF due to NICM, HTN, HLD, GERD seen today for acute visit due to SVT.    Seen in ER 1/30/2 for nasal congestion, chest discomfort, fatigue, flushing and lightheadedness. EKG showed NSR. Troponin negative. COVID/Flu negative.  Patient reports he has had a lot going on recently. 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  He has not been sleeping well. He recently has had episodes in the last two months of chest discomfort and a flushed feeling. He is not sure that he has had any arrhythmia episodes during these events.  He wore a cardiac monitor with his PCP who requested ZIO results be sent to Dr. Elberta Fortis but unfortunately unable to find in our records (requesting again). He has an Apple Watch > we turned on rhythm monitoring for him in clinic.    He denies chest pain, palpitations, dyspnea, PND, orthopnea, nausea, vomiting, dizziness, syncope, edema, weight gain, or early satiety.   Review of systems complete and found to be negative unless listed in HPI.   EP Information / Studies Reviewed:    EKG is ordered today. Personal review as below.  EKG Interpretation Date/Time:  Monday April 11 2023 15:19:11 EDT Ventricular Rate:  55 PR Interval:  224 QRS Duration:  84 QT Interval:  384 QTC Calculation: 367 R Axis:   64  Text Interpretation: Sinus bradycardia with 1st degree A-V block Confirmed by Canary Brim ([id number]) on 04/11/2023 3:33:50 PM   Studies:  EPS 03/22/18 > AF on presentation, RF ablation of all 4 pulmonary veins, additional LA ablation  Cardiac CT Morphology 01/2022 > normal pulmonary  vein drainage, LA mod dilated, no PFO/ASD, no thrombus in LAA, CAC score of 730 / 77th percentile for matched controls  EPS 02/04/2022 > SR on presentation, RF ablation of all 4 pulmonary veins, WACA approach used, additional LA ablation with standard box lesion along the posterior wall of the LA   Arrhythmia / AAD AF / AFL Dofetilide > stopped 12/2022 after ~1 year post ablation   Risk Assessment/Calculations:    CHA2DS2-VASc Score = 2   This indicates a 2.2% annual risk of stroke. The patient's score is based upon: CHF History: 0 HTN History: 1 Diabetes History: 0 Stroke History: 0 Vascular Disease History: 0 Age Score: 1 Gender Score: 0             Physical Exam:   VS:  BP 122/60   Pulse (!) 55   Ht 6' (1.829 m)   Wt 191 lb 9.6 oz (86.9 kg)   SpO2 98%   BMI 25.99 kg/m    Wt Readings from Last 3 Encounters:  04/11/23 191 lb 9.6 oz (86.9 kg)  02/20/23 195 lb (88.5 kg)  01/18/23 198 lb 6.4 oz (90 kg)     GEN: Well nourished, well developed in no acute distress NECK: No JVD; No carotid bruits CARDIAC: Regular rate and rhythm, no murmurs, rubs, gallops RESPIRATORY:  Clear to auscultation without rales, wheezing or rhonchi  ABDOMEN: Soft, non-tender, non-distended EXTREMITIES:  No edema; No deformity   ASSESSMENT AND PLAN:  Persistent Atrial Fibrillation  Atrial Flutter  CHA2DS2-VASc 2, s/p ablation x2, previously on dofetilide  -continue Cartia XT 180mg  daily  -increase Toprol to 25 mg daily   -OAC for stroke prophylaxis  -obtain ZIO results from PCP > concern arrhythmia may be driving chest discomfort -refill Eliquis   Chest Discomfort  Elevated CAC on CT, prior negative stress test in 2022  -repeat Lexiscan stress test to r/o coronary blockage / abnormal response to stress  -labs for testing above > CBC, BMP  Secondary Hypercoagulable State  -continue Eliquis 5mg  BID, dose reviewed and appropriate by age / wt  Hypertension  -well controlled on  current regimen   Pt referred to Primary Cardiology for HTN/HLD control   Informed Consent   Shared Decision Making/Informed Consent The risks [chest pain, shortness of breath, cardiac arrhythmias, dizziness, blood pressure fluctuations, myocardial infarction, stroke/transient ischemic attack, nausea, vomiting, allergic reaction, radiation exposure, metallic taste sensation and life-threatening complications (estimated to be 1 in 10,000)], benefits (risk stratification, diagnosing coronary artery disease, treatment guidance) and alternatives of a nuclear stress test were discussed in detail with Gary [name] and he agrees to proceed.      Follow up with Dr. Elberta Fortis or EP APP in 4 weeks  Signed, Canary Brim, NP-C, AGACNP-BC [facility] - Electrophysiology  04/11/2023, 5:18 PM

## 2023-04-11 ENCOUNTER — Ambulatory Visit: Attending: Pulmonary Disease | Admitting: Pulmonary Disease

## 2023-04-11 ENCOUNTER — Other Ambulatory Visit: Payer: Self-pay | Admitting: Physician Assistant

## 2023-04-11 ENCOUNTER — Encounter: Payer: Self-pay | Admitting: Pulmonary Disease

## 2023-04-11 ENCOUNTER — Encounter: Payer: Self-pay | Admitting: *Deleted

## 2023-04-11 ENCOUNTER — Other Ambulatory Visit: Payer: Self-pay | Admitting: Cardiology

## 2023-04-11 VITALS — BP 122/60 | HR 55 | Ht 72.0 in | Wt 191.6 lb

## 2023-04-11 DIAGNOSIS — B9689 Other specified bacterial agents as the cause of diseases classified elsewhere: Secondary | ICD-10-CM | POA: Diagnosis not present

## 2023-04-11 DIAGNOSIS — L0202 Furuncle of face: Secondary | ICD-10-CM | POA: Diagnosis not present

## 2023-04-11 DIAGNOSIS — I4892 Unspecified atrial flutter: Secondary | ICD-10-CM

## 2023-04-11 DIAGNOSIS — I4819 Other persistent atrial fibrillation: Secondary | ICD-10-CM

## 2023-04-11 DIAGNOSIS — R072 Precordial pain: Secondary | ICD-10-CM | POA: Diagnosis not present

## 2023-04-11 DIAGNOSIS — D6869 Other thrombophilia: Secondary | ICD-10-CM

## 2023-04-11 DIAGNOSIS — I480 Paroxysmal atrial fibrillation: Secondary | ICD-10-CM

## 2023-04-11 DIAGNOSIS — I10 Essential (primary) hypertension: Secondary | ICD-10-CM

## 2023-04-11 MED ORDER — METOPROLOL SUCCINATE ER 25 MG PO TB24: 25.0000 mg | ORAL_TABLET | Freq: Every day | ORAL | 3 refills | Status: DC

## 2023-04-11 NOTE — Telephone Encounter (Signed)
 Eliquis 5mg  refill request received. Patient is 73 years old, weight-88.5kg, Crea- 0.94 on 02/24/23, Diagnosis-Afib, and last seen by Dr. Elberta Fortis on 01/18/23. Dose is appropriate based on dosing criteria. Will send in refill to requested pharmacy.

## 2023-04-11 NOTE — Patient Instructions (Addendum)
 Medication Instructions:  Increase metoprolol succinate (Toprol XL) to 25 mg daily at bedtime *If you need a refill on your cardiac medications before your next appointment, please call your pharmacy*  Lab Work: None ordered If you have labs (blood work) drawn today and your tests are completely normal, you will receive your results only by: MyChart Message (if you have MyChart) OR A paper copy in the mail If you have any lab test that is abnormal or we need to change your treatment, we will call you to review the results.  Testing/Procedures: Your physician has requested that you have a lexiscan myoview. For further information please visit [email or web address] Please follow instruction sheet, as given.    Follow-Up: At [facility], you and your health needs are our priority.  As part of our continuing mission to provide you with exceptional heart care, we have created designated Provider Care Teams.  These Care Teams include your primary Cardiologist (physician) and Advanced Practice Providers (APPs -  Physician Assistants and Nurse Practitioners) who all work together to provide you with the care you need, when you need it.  Your next appointment:   1 month(s)  Provider:   Loman Brooklyn, MD or Canary Brim, NP    Schedule general cardiology follow up per recall.

## 2023-04-13 ENCOUNTER — Encounter (HOSPITAL_COMMUNITY): Payer: Self-pay

## 2023-04-21 ENCOUNTER — Ambulatory Visit (HOSPITAL_COMMUNITY): Attending: Internal Medicine

## 2023-04-21 DIAGNOSIS — R072 Precordial pain: Secondary | ICD-10-CM | POA: Insufficient documentation

## 2023-04-21 LAB — MYOCARDIAL PERFUSION IMAGING
LV dias vol: 129 mL (ref 62–150)
LV sys vol: 48 mL
Nuc Stress EF: 62 %
Peak HR: 77 {beats}/min
Rest HR: 51 {beats}/min
Rest Nuclear Isotope Dose: 10.5 mCi
SDS: 0
SRS: 0
SSS: 0
ST Depression (mm): 0 mm
Stress Nuclear Isotope Dose: 32.9 mCi
TID: 1.14

## 2023-04-21 MED ORDER — REGADENOSON 0.4 MG/5ML IV SOLN
0.4000 mg | Freq: Once | INTRAVENOUS | Status: AC
  Administered 2023-04-21: 0.4 mg via INTRAVENOUS

## 2023-04-21 MED ORDER — TECHNETIUM TC 99M TETROFOSMIN IV KIT
10.5000 | PACK | Freq: Once | INTRAVENOUS | Status: AC | PRN
  Administered 2023-04-21: 10.5 via INTRAVENOUS

## 2023-04-21 MED ORDER — TECHNETIUM TC 99M TETROFOSMIN IV KIT
32.9000 | PACK | Freq: Once | INTRAVENOUS | Status: AC | PRN
  Administered 2023-04-21: 32.9 via INTRAVENOUS

## 2023-04-27 DIAGNOSIS — L0202 Furuncle of face: Secondary | ICD-10-CM | POA: Diagnosis not present

## 2023-04-27 DIAGNOSIS — B9689 Other specified bacterial agents as the cause of diseases classified elsewhere: Secondary | ICD-10-CM | POA: Diagnosis not present

## 2023-04-28 NOTE — Telephone Encounter (Signed)
 Spoke with pt regarding his stress test results and heart monitor results. Pt verbalized understanding. All questions, if any were answered.

## 2023-05-11 NOTE — Progress Notes (Unsigned)
 Electrophysiology Office Note:   Date:  05/12/2023  ID:  Gary [name], DOB Mar 11, 1950, MRN [id number]  Primary Cardiologist: Gary Muss, MD Primary Heart Failure: None Electrophysiologist: Gary Jorja Loa, MD      History of Present Illness:   Gary [name] is a 72 y.o. male with h/o AF, AFL, HFrEF due to NICM, HTN, HLD, GERD  seen today for routine electrophysiology followup.   Since last being seen in our clinic the patient reports he feels he is doing better. He has had less episodes of the sensation of tightness in his chest.  His situational stressors have lightened up as well. He is planning on starting to exercise more.   He denies chest pain, palpitations, dyspnea, PND, orthopnea, nausea, vomiting, dizziness, syncope, edema, weight gain, or early satiety.   Review of systems complete and found to be negative unless listed in HPI.   EP Information / Studies Reviewed:    EKG is not ordered today. EKG from 04/11/23 reviewed which showed SB with 1st degree AVB      Studies:  EPS 03/22/18 > AF on presentation, RF ablation of all 4 pulmonary veins, additional LA ablation  Cardiac CT Morphology 01/2022 > normal pulmonary vein drainage, LA mod dilated, no PFO/ASD, no thrombus in LAA, CAC score of 730 / 77th percentile for matched controls  EPS 02/04/2022 > SR on presentation, RF ablation of all 4 pulmonary veins, WACA approach used, additional LA ablation with standard box lesion along the posterior wall of the LA  ZIO 03/2023 > Predominantly NSR, ave HR 58 bpm, min 41-max 141 bpm, 27 runs of SVT with fastest interval was also the longest (max rate 141 bpm, 23.1 sec long)   Arrhythmia / AAD AF / AFL Dofetilide > stopped 12/2022 after ~1 year post ablation    Risk Assessment/Calculations:    CHA2DS2-VASc Score = 2   This indicates a 2.2% annual risk of stroke. The patient's score is based upon: CHF History: 0 HTN History: 1 Diabetes History: 0 Stroke History:  0 Vascular Disease History: 0 Age Score: 1 Gender Score: 0             Physical Exam:   VS:  BP 118/66   Pulse (!) 52   Ht 6' (1.829 m)   Wt 196 lb 3.2 oz (89 kg)   SpO2 99%   BMI 26.61 kg/m    Wt Readings from Last 3 Encounters:  05/12/23 196 lb 3.2 oz (89 kg)  04/21/23 191 lb (86.6 kg)  04/11/23 191 lb 9.6 oz (86.9 kg)     GEN: Well nourished, well developed in no acute distress NECK: No JVD; No carotid bruits CARDIAC: Regular rate and rhythm, no murmurs, rubs, gallops RESPIRATORY:  Clear to auscultation without rales, wheezing or rhonchi  ABDOMEN: Soft, non-tender, non-distended EXTREMITIES:  No edema; No deformity   ASSESSMENT AND PLAN:    Persistent Atrial Fibrillation  Atrial Flutter  CHA2DS2-VASc 2, s/p ablation x2, previously on dofetilide  -continue Cartia XT 180 mg daily  -increase Toprol to 50 mg daily and follow symptoms  -discussed target HR for exercise as he is monitoring his HR much more closely with his Apple Watch  -OAC for stroke prophylaxis  -ZIO from primary reviewed, as above.  Few episodes of SVT.   Chest Discomfort  Elevated CAC on CT, prior negative stress test in 2022  -Lexiscan stress reassuring    Secondary Hypercoagulable State  -continue Eliquis 5mg  BID, dose reviewed and  appropriate by age/wt   Hypertension  -well controlled on current regimen   -pt referred to Cardiology for HTN/HLD mgmt   Insomnia  -has found a regimen of ativan and trazodone that works for him and is sleeping better    Follow up with EP APP in 3 months  Signed, Gary Doffing, NP-C, AGACNP-BC [facility] - Electrophysiology  05/12/2023, 12:22 PM

## 2023-05-12 ENCOUNTER — Encounter: Payer: Self-pay | Admitting: Pulmonary Disease

## 2023-05-12 ENCOUNTER — Ambulatory Visit: Attending: Pulmonary Disease | Admitting: Pulmonary Disease

## 2023-05-12 VITALS — BP 118/66 | HR 52 | Ht 72.0 in | Wt 196.2 lb

## 2023-05-12 DIAGNOSIS — I480 Paroxysmal atrial fibrillation: Secondary | ICD-10-CM

## 2023-05-12 DIAGNOSIS — I10 Essential (primary) hypertension: Secondary | ICD-10-CM | POA: Diagnosis not present

## 2023-05-12 DIAGNOSIS — I4819 Other persistent atrial fibrillation: Secondary | ICD-10-CM | POA: Diagnosis not present

## 2023-05-12 DIAGNOSIS — D6869 Other thrombophilia: Secondary | ICD-10-CM

## 2023-05-12 DIAGNOSIS — I4892 Unspecified atrial flutter: Secondary | ICD-10-CM

## 2023-05-12 MED ORDER — METOPROLOL SUCCINATE ER 50 MG PO TB24: 50.0000 mg | ORAL_TABLET | Freq: Every day | ORAL | 3 refills | Status: DC

## 2023-05-12 NOTE — Patient Instructions (Addendum)
 Medication Instructions:   START TAKING :  TOPROL XL 50 MG AT BEDTIME    *If you need a refill on your cardiac medications before your next appointment, please call your pharmacy*   Lab Work: NONE ORDERED  TODAY    If you have labs (blood work) drawn today and your tests are completely normal, you will receive your results only by: MyChart Message (if you have MyChart) OR A paper copy in the mail If you have any lab test that is abnormal or we need to change your treatment, we will call you to review the results.   Testing/Procedures: NONE ORDERED  TODAY    Follow-Up: At [facility], you and your health needs are our priority.  As part of our continuing mission to provide you with exceptional heart care, our providers are all part of one team.  This team includes your primary Cardiologist (physician) and Advanced Practice Providers or APPs (Physician Assistants and Nurse Practitioners) who all work together to provide you with the care you need, when you need it.  Your next appointment:    3 month(s) ( CONTACT  CASSIE HALL/ ANGELINE HAMMER FOR EP SCHEDULING ISSUES )    Provider:    Creighton Doffing, NP    We recommend signing up for the patient portal called "MyChart".  Sign up information is provided on this After Visit Summary.  MyChart is used to connect with patients for Virtual Visits (Telemedicine).  Patients are able to view lab/test results, encounter notes, upcoming appointments, etc.  Non-urgent messages can be sent to your provider as well.   To learn more about what you can do with MyChart, go to [email or web address].   Other Instructions       1st Floor: - Lobby - Registration  - Pharmacy  - Lab - Cafe  2nd Floor: - PV Lab - Diagnostic Testing (echo, CT, nuclear med)  3rd Floor: - Vacant  4th Floor: - TCTS (cardiothoracic surgery) - AFib Clinic - Structural Heart Clinic - Vascular Surgery  - Vascular Ultrasound  5th Floor: -  HeartCare Cardiology (general and EP) - Clinical Pharmacy for coumadin, hypertension, lipid, weight-loss medications, and med management appointments    Valet parking services will be available as well.

## 2023-05-17 DIAGNOSIS — X32XXXD Exposure to sunlight, subsequent encounter: Secondary | ICD-10-CM | POA: Diagnosis not present

## 2023-05-17 DIAGNOSIS — L570 Actinic keratosis: Secondary | ICD-10-CM | POA: Diagnosis not present

## 2023-05-17 DIAGNOSIS — L820 Inflamed seborrheic keratosis: Secondary | ICD-10-CM | POA: Diagnosis not present

## 2023-06-01 ENCOUNTER — Ambulatory Visit: Attending: Cardiology | Admitting: Cardiology

## 2023-06-01 ENCOUNTER — Encounter: Payer: Self-pay | Admitting: Cardiology

## 2023-06-01 VITALS — BP 130/60 | HR 53 | Resp 16 | Ht 72.0 in | Wt 193.8 lb

## 2023-06-01 DIAGNOSIS — I25118 Atherosclerotic heart disease of native coronary artery with other forms of angina pectoris: Secondary | ICD-10-CM | POA: Diagnosis not present

## 2023-06-01 DIAGNOSIS — I480 Paroxysmal atrial fibrillation: Secondary | ICD-10-CM | POA: Diagnosis not present

## 2023-06-01 DIAGNOSIS — I4729 Other ventricular tachycardia: Secondary | ICD-10-CM | POA: Insufficient documentation

## 2023-06-01 DIAGNOSIS — I471 Supraventricular tachycardia, unspecified: Secondary | ICD-10-CM | POA: Insufficient documentation

## 2023-06-01 NOTE — Patient Instructions (Signed)
 Testing/Procedures: Echo  Your physician has requested that you have an echocardiogram. Echocardiography is a painless test that uses sound waves to create images of your heart. It provides your doctor with information about the size and shape of your heart and how well your heart's chambers and valves are working. This procedure takes approximately one hour. There are no restrictions for this procedure. Please do NOT wear cologne, perfume, aftershave, or lotions (deodorant is allowed). Please arrive 15 minutes prior to your appointment time.  Please note: We ask at that you not bring children with you during ultrasound (echo/ vascular) testing. Due to room size and safety concerns, children are not allowed in the ultrasound rooms during exams. Our front office staff cannot provide observation of children in our lobby area while testing is being conducted. An adult accompanying a patient to their appointment will only be allowed in the ultrasound room at the discretion of the ultrasound technician under special circumstances. We apologize for any inconvenience.   Follow-Up: At [facility], you and your health needs are our priority.  As part of our continuing mission to provide you with exceptional heart care, our providers are all part of one team.  This team includes your primary Cardiologist (physician) and Advanced Practice Providers or APPs (Physician Assistants and Nurse Practitioners) who all work together to provide you with the care you need, when you need it.  Your next appointment:   6 month(s)  Provider:   Cody Das, MD

## 2023-06-01 NOTE — Progress Notes (Signed)
 Cardiology Office Note:  .   Date:  06/01/2023  ID:  Dellar Fenton, DOB [birth year], MRN [id number] PCP: Glena Landau, MD  [facility] Providers Cardiologist:  Fransico Ivy, MD PCP: Glena Landau, MD  Chief Complaint  Patient presents with   Atrial Fibrillation   Follow-up     BIGE BARRIENTOS is a 73 y.o. male with hypertension, hyperlipidemia, nonischemic cardiomyopathy w/recovered LVEF, elevated coronary calcium  score, paroxysmal atrial A-fib/flutter s/p ablation, PSVT, NSVT  Patient previously used to see Dr. Jacquelynn Matter.  He is being followed more closely by EP recently.  Few months ago, he had noticed complaints of chest pain and rapid heart rate with walking upstairs.  He was noted to have 77th percentile calcium  score on the CT scan 01/2022.  Recent stress test showed no ischemia.  Zio monitor placed by PCP Dr. Hermann Look, personally reviewed and independently interpreted by me, showed brief episodes of NSVT and PSVT.  Patient is now doing well.  He is currently on metoprolol  succinate 50 mg daily, and Cartia  180 mg daily.  He has not had any recent episodes of chest pain or shortness of breath as well as increasing physical activity.  Recent lab results reviewed by me, details below.   Vitals:   06/01/23 0819  BP: 130/60  Pulse: (!) 53  Resp: 16  SpO2: 99%      Review of Systems  Cardiovascular:  Positive for palpitations (Improved). Negative for chest pain, dyspnea on exertion, leg swelling and syncope.        Studies Reviewed: Aaron Aas        EKG 04/11/2023: Sinus rhythm 55 bpm First-degree block  Labs 03/2023: Chol 100, TG 69, HDL 39, LDL 47 HbA1C 5.4% Hb 14.6  Zio patch monitor 13 days 03/07/2023 - 03/21/2023: Dominant rhythm: Sinus. HR 41-133 bpm. Avg HR 58 bpm, in sinus rhythm. 27 episodes of SVT/atrial tachycardia, fastest at 141 bpm for 6 beats, longest for 23 secs at 93 bpm (No reported symptoms) 1.1% isolated SVE, <1% couplet/triplets. 2  episodes of VT, fastest and longest at 141 bpm for 15 beats. (No reported symptoms) <1% isolated VE, couplets. No atrial fibrillation/atrial flutter/high grade AV block, sinus pause >3sec noted. 3 patient triggered events correlated with SVE, VE.   Stress test 03/2023: No ischemia.  EF 55 to 65%.  CT cardiac scoring 01/2018:: Calcium : 730, 77th percentile  Risk Assessment/Calculations:    CHA2DS2-VASc Score = 2   This indicates a 2.2% annual risk of stroke. The patient's score is based upon: CHF History: 0 HTN History: 1 Diabetes History: 0 Stroke History: 0 Vascular Disease History: 0 Age Score: 1 Gender Score: 0     Physical Exam Vitals and nursing note reviewed.  Constitutional:      General: He is not in acute distress. Neck:     Vascular: No JVD.  Cardiovascular:     Rate and Rhythm: Normal rate and regular rhythm.     Heart sounds: Normal heart sounds. No murmur heard. Pulmonary:     Effort: Pulmonary effort is normal.     Breath sounds: Normal breath sounds. No wheezing or rales.  Musculoskeletal:     Right lower leg: No edema.     Left lower leg: No edema.      VISIT DIAGNOSES:   ICD-10-CM   1. PSVT (paroxysmal supraventricular tachycardia) (HCC)  I47.10 ECHOCARDIOGRAM COMPLETE    2. NSVT (nonsustained ventricular tachycardia) (HCC)  I47.29 ECHOCARDIOGRAM COMPLETE    3. PAF (  paroxysmal atrial fibrillation) (HCC)  I48.0     4. Coronary artery disease of native artery of native heart with stable angina pectoris [facility])  I25.118        TAVITA RODELA is a 73 y.o. male with hypertension, hyperlipidemia, nonischemic cardiomyopathy w/recovered LVEF, elevated coronary calcium  score, paroxysmal atrial A-fib/flutter s/p ablation, PSVT, NSVT Assessment & Plan  Palpitations: History of A-fib and flutter with prior ablation.  Recent ZIO monitor does not show A-fib/flutter, but did show 15 beat NSVT on 23 second PSVT.  Metoprolol  succinate dose has been increased  since then to 50 mg daily, also on cardia XT 180 mg daily.  Continue same.  Will check echocardiogram to rule out any structural abnormality.  Elevated coronary calcium  score: 77 percentile calcium  score, no ischemia on stress testing 03/2023. On aspirin due to ongoing use of Eliquis . Lipids very well-controlled on Crestor  20 mg daily.  PAF/flutter: No recent recurrence. Continue Eliquis  5 mg twice daily.   F/u in 6 months  Signed, Cody Das, MD

## 2023-06-30 ENCOUNTER — Telehealth (HOSPITAL_COMMUNITY): Payer: Self-pay | Admitting: *Deleted

## 2023-06-30 MED ORDER — DILTIAZEM HCL 30 MG PO TABS: ORAL_TABLET | ORAL | 1 refills | Status: AC

## 2023-06-30 NOTE — Telephone Encounter (Signed)
 Patient called in stating he is having breakthrough afib intermittently that started yesterday with heart rates in the 130-150s according to apple watch.  Pt states BP is stable but has noticed fatigue. Discussed with Myrtha Ates PA recommends use of PRN Cardizem  30mg  to use every 4 hours for heart rates over 100. Pt verbalized agreement.

## 2023-07-06 ENCOUNTER — Ambulatory Visit: Payer: Self-pay | Admitting: Cardiology

## 2023-07-06 ENCOUNTER — Ambulatory Visit (HOSPITAL_COMMUNITY)
Admission: RE | Admit: 2023-07-06 | Discharge: 2023-07-06 | Disposition: A | Discharge status: Home / Self Care | Source: Ambulatory Visit | Attending: Cardiology | Admitting: Cardiology

## 2023-07-06 DIAGNOSIS — I4892 Unspecified atrial flutter: Secondary | ICD-10-CM | POA: Insufficient documentation

## 2023-07-06 DIAGNOSIS — E785 Hyperlipidemia, unspecified: Secondary | ICD-10-CM | POA: Insufficient documentation

## 2023-07-06 DIAGNOSIS — I471 Supraventricular tachycardia, unspecified: Secondary | ICD-10-CM

## 2023-07-06 DIAGNOSIS — I4891 Unspecified atrial fibrillation: Secondary | ICD-10-CM | POA: Diagnosis not present

## 2023-07-06 DIAGNOSIS — I10 Essential (primary) hypertension: Secondary | ICD-10-CM | POA: Insufficient documentation

## 2023-07-06 DIAGNOSIS — I340 Nonrheumatic mitral (valve) insufficiency: Secondary | ICD-10-CM

## 2023-07-06 DIAGNOSIS — I4729 Other ventricular tachycardia: Secondary | ICD-10-CM | POA: Diagnosis not present

## 2023-07-06 DIAGNOSIS — I083 Combined rheumatic disorders of mitral, aortic and tricuspid valves: Secondary | ICD-10-CM | POA: Diagnosis not present

## 2023-07-06 LAB — ECHOCARDIOGRAM COMPLETE: S' Lateral: 3.7 cm

## 2023-07-07 ENCOUNTER — Ambulatory Visit: Admitting: Podiatry

## 2023-07-07 DIAGNOSIS — M2041 Other hammer toe(s) (acquired), right foot: Secondary | ICD-10-CM | POA: Diagnosis not present

## 2023-07-07 DIAGNOSIS — L84 Corns and callosities: Secondary | ICD-10-CM

## 2023-07-08 ENCOUNTER — Other Ambulatory Visit: Payer: Self-pay | Admitting: Cardiology

## 2023-07-08 NOTE — Progress Notes (Signed)
 Subjective:   Patient ID: Gary [name], male   DOB: 73 y.o.   MRN: [id number]   HPI Overall doing pretty well the spot between my toes seems to have settled down and I am wondering about orthotics   ROS      Objective:  Physical Exam  Neurovascular status intact old orthotics which are doing a good job for him holding his arch up properly with lesion fourth interspace right as secondary problem     Assessment:  Generalized foot issues secondary to structure control relatively wear with orthotics made previously with chronic lesion fourth interspace right that does not hurt like it did     Plan:  H&P reviewed both conditions and since he is not having current pain we will get a hold off on new orthotics but may be necessary and advised on the lesion between the 4th and 5th toes that he will continue the same trending and if we end up having to do something more we will

## 2023-07-14 DIAGNOSIS — I119 Hypertensive heart disease without heart failure: Secondary | ICD-10-CM | POA: Diagnosis not present

## 2023-07-14 DIAGNOSIS — I4891 Unspecified atrial fibrillation: Secondary | ICD-10-CM | POA: Diagnosis not present

## 2023-07-19 ENCOUNTER — Ambulatory Visit (HOSPITAL_COMMUNITY)
Admission: RE | Admit: 2023-07-19 | Discharge: 2023-07-19 | Disposition: A | Discharge status: Home / Self Care | Payer: PPO | Source: Ambulatory Visit | Attending: Physician Assistant | Admitting: Physician Assistant

## 2023-07-19 ENCOUNTER — Inpatient Hospital Stay (HOSPITAL_COMMUNITY)
Admission: RE | Admit: 2023-07-19 | Discharge: 2023-07-19 | Disposition: A | Discharge status: Home / Self Care | Source: Ambulatory Visit | Attending: Physician Assistant

## 2023-07-19 ENCOUNTER — Encounter (HOSPITAL_COMMUNITY): Payer: Self-pay | Admitting: Physician Assistant

## 2023-07-19 VITALS — BP 116/74 | HR 74 | Ht 72.0 in | Wt 192.0 lb

## 2023-07-19 DIAGNOSIS — I4819 Other persistent atrial fibrillation: Secondary | ICD-10-CM

## 2023-07-19 DIAGNOSIS — I484 Atypical atrial flutter: Secondary | ICD-10-CM

## 2023-07-19 DIAGNOSIS — D6869 Other thrombophilia: Secondary | ICD-10-CM | POA: Diagnosis not present

## 2023-07-19 NOTE — Progress Notes (Signed)
 Primary Care Physician: Loreli Kins, MD Primary Cardiologist: Dr Dann  Primary Electrophysiologist: Dr Inocencio  Referring Physician: Dr Kelsie Fairy Gary [name] is a 73 y.o. male with a history of NICM, HTN, HLD, atrial flutter, and atrial fibrillation who presents for follow up in the [facility]. Patient is on Eliquis  for a CHADS2VASC score of 2. He has been maintained on dofetilide  for his afib and is s/p afib ablation 02/2018. He has had brief (10-15 minute) episodes of afib since ablation but on 04/23/20 he noted a persistently elevated heart rate. He took an extra dose of his BB but this did not slow his heart rate. He was set up for DCCV but spontaneously converted to SR. Patient is s/p repeat ablation with Dr Inocencio on 02/04/22. Dofetilide  was discontinued 01/18/23.  Patient returns for follow up for atrial fibrillation and atrial flutter. Patient is in atrial flutter today. He has noticed an increase in his resting heart rate from 50's bpm to 80's bpm. He feels well currently. His Kardia mobile shows episodes of afib, unclear is sinus readings are truly sinus or regular atrial flutter. No bleeding issues on anticoagulation.   Today, he  denies symptoms of chest pain, orthopnea, PND, lower extremity edema, dizziness, presyncope, syncope, snoring, daytime somnolence, bleeding, or neurologic sequela. The patient is tolerating medications without difficulties and is otherwise without complaint today.    Atrial Fibrillation Risk Factors:  he does not have symptoms or diagnosis of sleep apnea.. he does not have a history of rheumatic fever.   Atrial Fibrillation Management history:  Previous antiarrhythmic drugs: dofetilide  Previous cardioversions: 2018, 2019, 2020 x 4 Previous ablations: 02/2018, 02/04/22 Anticoagulation history: Eliquis    Past Medical History:  Diagnosis Date   Arthritis    Atrial flutter (HCC) 2007   s/p CTI ablation by Dr  Waddell   Back pain    resolved per pt 12/06/18   Dysrhythmia    afib   Fibromyalgia    GERD (gastroesophageal reflux disease)    Hyperlipidemia    Hypertension    Insomnia    occasional per pt 12/06/18   NICM (nonischemic cardiomyopathy) (HCC)    a. 2005 - tachy mediated. normalized after return of NSR.   Obesity    Paroxysmal atrial fibrillation (HCC)    Pneumonia    x 1   Spinal stenosis     Current Outpatient Medications  Medication Sig Dispense Refill   acetaminophen  (TYLENOL ) 500 MG tablet Take 1,000 mg by mouth every 6 (six) hours as needed (pain).     ascorbic acid (VITAMIN C ) 500 MG tablet Take 500 mg by mouth daily.     benzonatate (TESSALON) 100 MG capsule Take 100 mg by mouth 3 (three) times daily as needed for cough.     calcium  carbonate (TUMS - DOSED IN MG ELEMENTAL CALCIUM ) 500 MG chewable tablet Chew 1-2 tablets by mouth 2 (two) times daily as needed for indigestion or heartburn.     CARTIA  XT 180 MG 24 hr capsule TAKE ONE CAPSULE BY MOUTH IN THE EVENING 90 capsule 2   cetirizine (ZYRTEC) 10 MG tablet Take 10 mg by mouth daily as needed for allergies.     cholecalciferol (VITAMIN D3) 25 MCG (1000 UNIT) tablet Take 1,000 Units by mouth daily.     diltiazem  (CARDIZEM ) 30 MG tablet Take 1 tablet every 4 hours AS NEEDED for AFIB heart rate >100 as long as top BP >100. 45 tablet 1  ELIQUIS  5 MG TABS tablet TAKE ONE TABLET BY MOUTH TWICE DAILY 60 tablet 5   guaiFENesin (MUCINEX) 600 MG 12 hr tablet Take 600 mg by mouth 2 (two) times daily as needed for cough or to loosen phlegm.     LORazepam (ATIVAN) 1 MG tablet Take 1 mg by mouth at bedtime as needed.     Lutein-Zeaxanthin 25-5 MG CAPS      metoprolol  succinate (TOPROL -XL) 50 MG 24 hr tablet Take 1 tablet (50 mg total) by mouth at bedtime. 90 tablet 3   Multiple Vitamins-Minerals (MULTIVITAMIN ADULTS 50+ PO) Take 1 tablet by mouth daily.     Omega-3 Fatty Acids (FISH OIL ) 1200 MG CAPS Take 1,200 mg by mouth daily.      polyethylene glycol (MIRALAX  / GLYCOLAX ) 17 g packet Take 17 g by mouth daily.     rosuvastatin  (CRESTOR ) 20 MG tablet Take 1 tablet (20 mg total) by mouth daily. 90 tablet 3   simethicone  (MYLICON) 80 MG chewable tablet Chew 80-160 mg by mouth every 6 (six) hours as needed for flatulence.     sodium chloride  (OCEAN) 0.65 % SOLN nasal spray Place 1 spray into both nostrils as needed for congestion.     tadalafil (CIALIS) 20 MG tablet Take 20 mg by mouth daily as needed for erectile dysfunction.     traZODone (DESYREL) 50 MG tablet Take 50 mg by mouth at bedtime.     valACYclovir (VALTREX) 1000 MG tablet Take 2,000 mg by mouth every 12 (twelve) hours as needed (cold sore).      Current Facility-Administered Medications  Medication Dose Route Frequency Provider Last Rate Last Admin   0.9 %  sodium chloride  infusion   Intravenous PRN Loreli Kins, MD       triamcinolone  acetonide (KENALOG ) 10 MG/ML injection 10 mg  10 mg Other Once Regal, Norman S, DPM        ROS- All systems are reviewed and negative except as per the HPI above.  Physical Exam: Vitals:   07/19/23 0928  BP: 116/74  Pulse: 74  Weight: 87.1 kg  Height: 6' (1.829 m)    GEN: Well nourished, well developed in no acute distress CARDIAC: Irregularly irregular rate and rhythm, no murmurs, rubs, gallops RESPIRATORY:  Clear to auscultation without rales, wheezing or rhonchi  ABDOMEN: Soft, non-tender, non-distended EXTREMITIES:  No edema; No deformity    Wt Readings from Last 3 Encounters:  07/19/23 87.1 kg  06/01/23 87.9 kg  05/12/23 89 kg    EKG today demonstrates  Atrial flutter with variable block Vent. rate 74 BPM PR interval * ms QRS duration 80 ms QT/QTcB 354/392 ms   Echo 07/06/23 demonstrated   1. 3D EF does not appear to be accurate. Left ventricular ejection  fraction, by estimation, is 55 to 60%. The left ventricle has normal  function. The left ventricle has no regional wall motion abnormalities.   Left ventricular diastolic parameters were normal.   2. Right ventricular systolic function is normal. The right ventricular  size is normal. There is normal pulmonary artery systolic pressure.   3. Left atrial size was mildly dilated.   4. Right atrial size was mildly dilated.   5. The mitral valve is abnormal. Mild mitral valve regurgitation. No  evidence of mitral stenosis.   6. The aortic valve is tricuspid. There is moderate calcification of the  aortic valve. There is moderate thickening of the aortic valve. Aortic  valve regurgitation is not visualized. Aortic valve  sclerosis is present,  with no evidence of aortic valve  stenosis.   7. The inferior vena cava is normal in size with greater than 50%  respiratory variability, suggesting right atrial pressure of 3 mmHg.   Epic records are reviewed at length today   CHA2DS2-VASc Score = 2  The patient's score is based upon: CHF History: 0 HTN History: 1 Diabetes History: 0 Stroke History: 0 Vascular Disease History: 0 Age Score: 1 Gender Score: 0       ASSESSMENT AND PLAN: Persistent Atrial Fibrillation/atrial flutter The patient's CHA2DS2-VASc score is 2, indicating a 2.2% annual risk of stroke.   S/p afib ablation 02/2018 and 02/04/22, now off dofetilide .  Patient in atrial flutter today. We discussed rhythm control options including DCCV, starting AAD, and repeat ablation. Unclear if he is persistent or paroxysmal. Will have him wear a 2 week Zio monitor to evaluate. Will have him follow up with Dr Inocencio to discuss PFA.  Continue diltiazem  180 mg daily with 30 mg PRN q 4 hours for heart racing Continue Toprol  50 mg daily Continue Eliquis  5 mg BID  Secondary Hypercoagulable State (ICD10:  D68.69) The patient is at significant risk for stroke/thromboembolism based upon his CHA2DS2-VASc Score of 2.  Continue Apixaban  (Eliquis ). No bleeding issues.   HTN Stable on current regimen  CAD No anginal symptoms Followed by  Dr Elmira   Follow up with Dr Inocencio once patient has completed monitor.    Daril Kicks PA-C [facility] [address] [place], [place] [address] [phone] 07/19/2023 9:34 AM

## 2023-07-25 DIAGNOSIS — I4891 Unspecified atrial fibrillation: Secondary | ICD-10-CM | POA: Diagnosis not present

## 2023-07-25 DIAGNOSIS — E782 Mixed hyperlipidemia: Secondary | ICD-10-CM | POA: Diagnosis not present

## 2023-07-25 DIAGNOSIS — I119 Hypertensive heart disease without heart failure: Secondary | ICD-10-CM | POA: Diagnosis not present

## 2023-07-26 ENCOUNTER — Ambulatory Visit: Admitting: Pulmonary Disease

## 2023-08-01 ENCOUNTER — Telehealth (HOSPITAL_COMMUNITY): Payer: Self-pay | Admitting: *Deleted

## 2023-08-01 DIAGNOSIS — I480 Paroxysmal atrial fibrillation: Secondary | ICD-10-CM

## 2023-08-01 MED ORDER — METOPROLOL SUCCINATE ER 50 MG PO TB24: 25.0000 mg | ORAL_TABLET | Freq: Every day | ORAL | Status: DC

## 2023-08-01 NOTE — Telephone Encounter (Signed)
 Patient called in stating he has noticed for the last several days his heart rates at rest are in the mid-40s to low 50s. He has no symptoms.  Discussed with Daril Kicks PA will decrease metoprolol  to 25mg  a day. Call with update of response in 1 week. Pt verbalized agreement.

## 2023-08-05 DIAGNOSIS — I484 Atypical atrial flutter: Secondary | ICD-10-CM | POA: Diagnosis not present

## 2023-08-08 ENCOUNTER — Ambulatory Visit (HOSPITAL_COMMUNITY): Payer: Self-pay | Admitting: Physician Assistant

## 2023-08-11 DIAGNOSIS — N529 Male erectile dysfunction, unspecified: Secondary | ICD-10-CM | POA: Diagnosis not present

## 2023-08-11 DIAGNOSIS — N486 Induration penis plastica: Secondary | ICD-10-CM | POA: Diagnosis not present

## 2023-08-12 DIAGNOSIS — I119 Hypertensive heart disease without heart failure: Secondary | ICD-10-CM | POA: Diagnosis not present

## 2023-08-12 DIAGNOSIS — I4891 Unspecified atrial fibrillation: Secondary | ICD-10-CM | POA: Diagnosis not present

## 2023-08-25 DIAGNOSIS — I4891 Unspecified atrial fibrillation: Secondary | ICD-10-CM | POA: Diagnosis not present

## 2023-08-25 DIAGNOSIS — E782 Mixed hyperlipidemia: Secondary | ICD-10-CM | POA: Diagnosis not present

## 2023-08-25 DIAGNOSIS — I119 Hypertensive heart disease without heart failure: Secondary | ICD-10-CM | POA: Diagnosis not present

## 2023-08-30 ENCOUNTER — Ambulatory Visit: Admitting: Cardiology

## 2023-08-31 DIAGNOSIS — U071 COVID-19: Secondary | ICD-10-CM | POA: Diagnosis not present

## 2023-08-31 DIAGNOSIS — R051 Acute cough: Secondary | ICD-10-CM | POA: Diagnosis not present

## 2023-08-31 DIAGNOSIS — R5383 Other fatigue: Secondary | ICD-10-CM | POA: Diagnosis not present

## 2023-09-11 DIAGNOSIS — I4891 Unspecified atrial fibrillation: Secondary | ICD-10-CM | POA: Diagnosis not present

## 2023-09-11 DIAGNOSIS — I119 Hypertensive heart disease without heart failure: Secondary | ICD-10-CM | POA: Diagnosis not present

## 2023-09-12 NOTE — Progress Notes (Unsigned)
 Electrophysiology Office Note:   Date:  09/13/2023  ID:  JAXZEN VANHORN, DOB March 25, 1950, MRN [id number]  Primary Cardiologist: Newman JINNY Lawrence, MD Primary Heart Failure: None Electrophysiologist: Delron Comer Gladis Norton, MD      History of Present Illness:   AMBER GUTHRIDGE is a 73 y.o. male with h/o chronic systolic heart failure, hypertension, hyperlipidemia, atrial fibrillation/flutter seen today for routine electrophysiology followup.   He is post atrial fibrillation ablation in 2020.  He is on dofetilide .  He presented to atrial fibrillation clinic in atrial flutter.  He feels significant palpitations and fatigue when he is in atrial flutter.  In addition, his heart rates have been fast at times when he is exerting himself.  He would prefer rhythm control.  Since last being seen in our clinic the patient reports doing well.  He is in normal rhythm today.  he denies chest pain, palpitations, dyspnea, PND, orthopnea, nausea, vomiting, dizziness, syncope, edema, weight gain, or early satiety.   Review of systems complete and found to be negative unless listed in HPI.   EP Information / Studies Reviewed:    EKG is ordered today. Personal review as below.  EKG Interpretation Date/Time:  Tuesday September 13 2023 12:15:30 EDT Ventricular Rate:  47 PR Interval:    QRS Duration:  90 QT Interval:  410 QTC Calculation: 362 R Axis:   67  Text Interpretation: Sinus rhythm When compared with ECG of 19-Jul-2023 09:31, Sinus rhythm Confirmed by Asiyah Pineau ([id number]) on 09/13/2023 12:25:35 PM   Risk Assessment/Calculations:    CHA2DS2-VASc Score = 3   This indicates a 3.2% annual risk of stroke. The patient's score is based upon: CHF History: 0 HTN History: 1 Diabetes History: 0 Stroke History: 0 Vascular Disease History: 1 Age Score: 1 Gender Score: 0            Physical Exam:   VS:  BP 136/61 (BP Location: Left Arm, Patient Position: Sitting, Cuff Size: Normal)   Pulse (!) 47    Ht 6' (1.829 m)   Wt 186 lb (84.4 kg)   SpO2 99%   BMI 25.23 kg/m    Wt Readings from Last 3 Encounters:  09/13/23 186 lb (84.4 kg)  07/19/23 192 lb (87.1 kg)  06/01/23 193 lb 12.8 oz (87.9 kg)     GEN: Well nourished, well developed in no acute distress NECK: No JVD; No carotid bruits CARDIAC: Regular rate and rhythm, no murmurs, rubs, gallops RESPIRATORY:  Clear to auscultation without rales, wheezing or rhonchi  ABDOMEN: Soft, non-tender, non-distended EXTREMITIES:  No edema; No deformity   ASSESSMENT AND PLAN:    1.  Persistent atrial fibrillation/flutter: Post ablation in 2020 and 2024.  Was previously on dofetilide  but this has been stopped.  He presented to atrial fibrillation clinic in atrial flutter.  He is continue to have episodes of atrial fibrillation and flutter.  He would prefer further rhythm control.  Due to that, we Lua Feng plan for ablation.  Risk, benefits, and alternatives to EP study and radiofrequency/pulse field ablation for afib were also discussed in detail today. These risks include but are not limited to stroke, bleeding, vascular damage, tamponade, perforation, damage to the esophagus, lungs, and other structures, pulmonary vein stenosis, worsening renal function, and death. The patient understands these risk and wishes to proceed.  We Nancyann Cotterman therefore proceed with catheter ablation at the next available time.  Carto, ICE, anesthesia are requested for the procedure.  This patient Kaiyana Bedore require CT prior to  ablation. To be scheduled.   2.  Secondary hypercoagulable state: On Eliquis   3.  Hypertension: Well-controlled  4.  Coronary disease: No current angina  Follow up with Afib Clinic as usual post procedure  Signed, Shakerria Parran Gladis Norton, MD

## 2023-09-13 ENCOUNTER — Encounter: Payer: Self-pay | Admitting: Cardiology

## 2023-09-13 ENCOUNTER — Ambulatory Visit: Attending: Cardiology | Admitting: Cardiology

## 2023-09-13 VITALS — BP 136/61 | HR 47 | Ht 72.0 in | Wt 186.0 lb

## 2023-09-13 DIAGNOSIS — I4819 Other persistent atrial fibrillation: Secondary | ICD-10-CM | POA: Diagnosis not present

## 2023-09-13 DIAGNOSIS — Z01812 Encounter for preprocedural laboratory examination: Secondary | ICD-10-CM | POA: Diagnosis not present

## 2023-09-13 DIAGNOSIS — D6869 Other thrombophilia: Secondary | ICD-10-CM

## 2023-09-13 DIAGNOSIS — I4729 Other ventricular tachycardia: Secondary | ICD-10-CM | POA: Diagnosis not present

## 2023-09-13 DIAGNOSIS — I484 Atypical atrial flutter: Secondary | ICD-10-CM

## 2023-09-13 NOTE — Patient Instructions (Signed)
 Medication Instructions:  Your physician recommends that you continue on your current medications as directed. Please refer to the Current Medication list given to you today.  *If you need a refill on your cardiac medications before your next appointment, please call your pharmacy*   Lab Work: Pre procedure labs -- we will call you to schedule:  BMP & CBC  If you have a lab test that is abnormal and we need to change your treatment, we will call you to review the results -- otherwise no news is good news.    Testing/Procedures: Your physician has requested that you have cardiac CT 3 weeks PRIOR to your ablation. Cardiac computed tomography (CT) is a painless test that uses an x-ray machine to take clear, detailed pictures of your heart. We will contact you if the result is abnormal. We will call you to schedule.  Your physician has recommended that you have an ablation. Catheter ablation is a medical procedure used to treat some cardiac arrhythmias (irregular heartbeats). During catheter ablation, a long, thin, flexible tube is put into a blood vessel in your groin (upper thigh), or neck. This tube is called an ablation catheter. It is then guided to your heart through the blood vessel. Radio frequency waves destroy small areas of heart tissue where abnormal heartbeats may cause an arrhythmia to start.   Your ablation is scheduled for 11/30/2023. Please arrive at [facility] at 6:30 am.  We will call/send instructions at a later date.   Follow-Up: At [facility], you and your health needs are our priority.  As part of our continuing mission to provide you with exceptional heart care, we have created designated Provider Care Teams.  These Care Teams include your primary Cardiologist (physician) and Advanced Practice Providers (APPs -  Physician Assistants and Nurse Practitioners) who all work together to provide you with the care you need, when you need it.  Your next appointment:    1 month(s) after your ablation  The format for your next appointment:   In Person  Provider:   AFib clinic   Thank you for choosing [facility]!!   Maeola Domino, RN [phone]    Other Instructions   Cardiac Ablation Cardiac ablation is a procedure to destroy (ablate) some heart tissue that is sending bad signals. These bad signals cause problems in heart rhythm. The heart has many areas that make these signals. If there are problems in these areas, they can make the heart beat in a way that is not normal. Destroying some tissues can help make the heart rhythm normal. Tell your doctor about: Any allergies you have. All medicines you are taking. These include vitamins, herbs, eye drops, creams, and over-the-counter medicines. Any problems you or family members have had with medicines that make you fall asleep (anesthetics). Any blood disorders you have. Any surgeries you have had. Any medical conditions you have, such as kidney failure. Whether you are pregnant or may be pregnant. What are the risks? This is a safe procedure. But problems may occur, including: Infection. Bruising and bleeding. Bleeding into the chest. Stroke or blood clots. Damage to nearby areas of your body. Allergies to medicines or dyes. The need for a pacemaker if the normal system is damaged. Failure of the procedure to treat the problem. What happens before the procedure? Medicines Ask your doctor about: Changing or stopping your normal medicines. This is important. Taking aspirin and ibuprofen. Do not take these medicines unless your doctor tells you  to take them. Taking other medicines, vitamins, herbs, and supplements. General instructions Follow instructions from your doctor about what you cannot eat or drink. Plan to have someone take you home from the hospital or clinic. If you will be going home right after the procedure, plan to have someone with you for 24 hours. Ask your  doctor what steps will be taken to prevent infection. What happens during the procedure?  An IV tube will be put into one of your veins. You will be given a medicine to help you relax. The skin on your neck or groin will be numbed. A cut (incision) will be made in your neck or groin. A needle will be put through your cut and into a large vein. A tube (catheter) will be put into the needle. The tube will be moved to your heart. Dye may be put through the tube. This helps your doctor see your heart. Small devices (electrodes) on the tube will send out signals. A type of energy will be used to destroy some heart tissue. The tube will be taken out. Pressure will be held on your cut. This helps stop bleeding. A bandage will be put over your cut. The exact procedure may vary among doctors and hospitals. What happens after the procedure? You will be watched until you leave the hospital or clinic. This includes checking your heart rate, breathing rate, oxygen, and blood pressure. Your cut will be watched for bleeding. You will need to lie still for a few hours. Do not drive for 24 hours or as long as your doctor tells you. Summary Cardiac ablation is a procedure to destroy some heart tissue. This is done to treat heart rhythm problems. Tell your doctor about any medical conditions you may have. Tell him or her about all medicines you are taking to treat them. This is a safe procedure. But problems may occur. These include infection, bruising, bleeding, and damage to nearby areas of your body. Follow what your doctor tells you about food and drink. You may also be told to change or stop some of your medicines. After the procedure, do not drive for 24 hours or as long as your doctor tells you. This information is not intended to replace advice given to you by your health care provider. Make sure you discuss any questions you have with your health care provider. Document Revised: 04/03/2021 Document  Reviewed: 12/14/2018 Elsevier Patient Education  2023 [employer]vier Inc.   Cardiac Ablation, Care After  This sheet gives you information about how to care for yourself after your procedure. Your health care provider may also give you more specific instructions. If you have problems or questions, contact your health care provider. What can I expect after the procedure? After the procedure, it is common to have: Bruising around your puncture site. Tenderness around your puncture site. Skipped heartbeats. If you had an atrial fibrillation ablation, you may have atrial fibrillation during the first several months after your procedure.  Tiredness (fatigue).  Follow these instructions at home: Puncture site care  Follow instructions from your health care provider about how to take care of your puncture site. Make sure you: If present, leave stitches (sutures), skin glue, or adhesive strips in place. These skin closures may need to stay in place for up to 2 weeks. If adhesive strip edges start to loosen and curl up, you may trim the loose edges. Do not remove adhesive strips completely unless your health care provider tells you to do  that. If a large square bandage is present, this may be removed 24 hours after surgery.  Check your puncture site every day for signs of infection. Check for: Redness, swelling, or pain. Fluid or blood. If your puncture site starts to bleed, lie down on your back, apply firm pressure to the area, and contact your health care provider. Warmth. Pus or a bad smell. A pea or small marble sized lump at the site is normal and can take up to three months to resolve.  Driving Do not drive for at least 4 days after your procedure or however long your health care provider recommends. (Do not resume driving if you have previously been instructed not to drive for other health reasons.) Do not drive or use heavy machinery while taking prescription pain medicine. Activity Avoid  activities that take a lot of effort for at least 7 days after your procedure. Do not lift anything that is heavier than 5 lb (4.5 kg) for one week.  No sexual activity for 1 week.  Return to your normal activities as told by your health care provider. Ask your health care provider what activities are safe for you. General instructions Take over-the-counter and prescription medicines only as told by your health care provider. Do not use any products that contain nicotine or tobacco, such as cigarettes and e-cigarettes. If you need help quitting, ask your health care provider. You may shower after 24 hours, but Do not take baths, swim, or use a hot tub for 1 week.  Do not drink alcohol for 24 hours after your procedure. Keep all follow-up visits as told by your health care provider. This is important. Contact a health care provider if: You have redness, mild swelling, or pain around your puncture site. You have fluid or blood coming from your puncture site that stops after applying firm pressure to the area. Your puncture site feels warm to the touch. You have pus or a bad smell coming from your puncture site. You have a fever. You have chest pain or discomfort that spreads to your neck, jaw, or arm. You have chest pain that is worse with lying on your back or taking a deep breath. You are sweating a lot. You feel nauseous. You have a fast or irregular heartbeat. You have shortness of breath. You are dizzy or light-headed and feel the need to lie down. You have pain or numbness in the arm or leg closest to your puncture site. Get help right away if: Your puncture site suddenly swells. Your puncture site is bleeding and the bleeding does not stop after applying firm pressure to the area. These symptoms may represent a serious problem that is an emergency. Do not wait to see if the symptoms will go away. Get medical help right away. Call your local emergency services (911 in the [place] Do not  drive yourself to the hospital. Summary After the procedure, it is normal to have bruising and tenderness at the puncture site in your groin, neck, or forearm. Check your puncture site every day for signs of infection. Get help right away if your puncture site is bleeding and the bleeding does not stop after applying firm pressure to the area. This is a medical emergency. This information is not intended to replace advice given to you by your health care provider. Make sure you discuss any questions you have with your health care provider.

## 2023-09-16 ENCOUNTER — Emergency Department (HOSPITAL_BASED_OUTPATIENT_CLINIC_OR_DEPARTMENT_OTHER)

## 2023-09-16 ENCOUNTER — Emergency Department (HOSPITAL_BASED_OUTPATIENT_CLINIC_OR_DEPARTMENT_OTHER)
Admission: EM | Admit: 2023-09-16 | Discharge: 2023-09-16 | Disposition: A | Discharge status: Home / Self Care | Attending: Emergency Medicine | Admitting: Emergency Medicine

## 2023-09-16 DIAGNOSIS — Z79899 Other long term (current) drug therapy: Secondary | ICD-10-CM | POA: Insufficient documentation

## 2023-09-16 DIAGNOSIS — Z7901 Long term (current) use of anticoagulants: Secondary | ICD-10-CM | POA: Diagnosis not present

## 2023-09-16 DIAGNOSIS — R1013 Epigastric pain: Secondary | ICD-10-CM | POA: Insufficient documentation

## 2023-09-16 DIAGNOSIS — R0789 Other chest pain: Secondary | ICD-10-CM | POA: Insufficient documentation

## 2023-09-16 DIAGNOSIS — I10 Essential (primary) hypertension: Secondary | ICD-10-CM | POA: Insufficient documentation

## 2023-09-16 DIAGNOSIS — R079 Chest pain, unspecified: Secondary | ICD-10-CM | POA: Diagnosis not present

## 2023-09-16 LAB — HEPATIC FUNCTION PANEL
ALT: 18 U/L (ref 0–44)
AST: 20 U/L (ref 15–41)
Albumin: 4.4 g/dL (ref 3.5–5.0)
Alkaline Phosphatase: 79 U/L (ref 38–126)
Bilirubin, Direct: 0.2 mg/dL (ref 0.0–0.2)
Indirect Bilirubin: 0.4 mg/dL (ref 0.3–0.9)
Total Bilirubin: 0.6 mg/dL (ref 0.0–1.2)
Total Protein: 7 g/dL (ref 6.5–8.1)

## 2023-09-16 LAB — BASIC METABOLIC PANEL WITH GFR
Anion gap: 11 (ref 5–15)
BUN: 11 mg/dL (ref 8–23)
CO2: 22 mmol/L (ref 22–32)
Calcium: 9.1 mg/dL (ref 8.9–10.3)
Chloride: 98 mmol/L (ref 98–111)
Creatinine, Ser: 0.79 mg/dL (ref 0.61–1.24)
GFR, Estimated: >60 mL/min (ref >60)
Glucose, Bld: 99 mg/dL (ref 70–99)
Potassium: 4.2 mmol/L (ref 3.5–5.1)
Sodium: 132 mmol/L — ABNORMAL LOW (ref 135–145)

## 2023-09-16 LAB — CBC
HCT: 42.5 % (ref 39.0–52.0)
Hemoglobin: 14.7 g/dL (ref 13.0–17.0)
MCH: 33 pg (ref 26.0–34.0)
MCHC: 34.6 g/dL (ref 30.0–36.0)
MCV: 95.5 fL (ref 80.0–100.0)
Platelets: 164 K/uL (ref 150–400)
RBC: 4.45 MIL/uL (ref 4.22–5.81)
RDW: 14.2 % (ref 11.5–15.5)
WBC: 5.7 K/uL (ref 4.0–10.5)
nRBC: 0 % (ref 0.0–0.2)

## 2023-09-16 LAB — TROPONIN T, HIGH SENSITIVITY
Troponin T High Sensitivity: <15 ng/L (ref 0–19)
Troponin T High Sensitivity: <15 ng/L (ref 0–19)

## 2023-09-16 LAB — LIPASE, BLOOD: Lipase: 41 U/L (ref 11–51)

## 2023-09-16 MED ORDER — OMEPRAZOLE 20 MG PO CPDR: 20.0000 mg | DELAYED_RELEASE_CAPSULE | Freq: Every day | ORAL | 0 refills | Status: DC

## 2023-09-16 NOTE — ED Notes (Signed)
 Initial contact made. Pt endorses sharp left chest pain 7/10 as well as tender upper abdominal pain. Denies N/V/D

## 2023-09-16 NOTE — ED Triage Notes (Signed)
 Woke up from sleeping with sharp left cp. Took pepcid  with no relief. HX afib and aflutter- ablation to be scheduled.

## 2023-09-16 NOTE — ED Provider Notes (Signed)
 [place] EMERGENCY DEPARTMENT AT [facility] Provider Note   CSN: [id number] Arrival date & time: 09/16/23  9787     Patient presents with: No chief complaint on file.   Gary [name] is a 73 y.o. male.   HPI     This is a 73 year old male with a history of hypertension, atrial flutter, reflux who presents with chest pain.  Patient reports that approximately 1:00 in the morning he woke up with sharp left-sided chest and abdominal discomfort.  No nausea or vomiting.  Initially thought it was reflux so took Pepcid  with minimal relief.  No shortness of breath.  No exertional symptoms.  He states that his symptoms have slowly resolved.  Prior to Admission medications   Medication Sig Start Date End Date Taking? Authorizing Provider  omeprazole  (PRILOSEC) 20 MG capsule Take 1 capsule (20 mg total) by mouth daily. 09/16/23  Yes Deylan Canterbury, Charmaine FALCON, MD  acetaminophen  (TYLENOL ) 500 MG tablet Take 1,000 mg by mouth every 6 (six) hours as needed (pain).    [provider]  ascorbic acid (VITAMIN C ) 500 MG tablet Take 500 mg by mouth daily.    [provider]  benzonatate (TESSALON) 100 MG capsule Take 100 mg by mouth 3 (three) times daily as needed for cough. 11/03/21   [provider]  calcium  carbonate (TUMS - DOSED IN MG ELEMENTAL CALCIUM ) 500 MG chewable tablet Chew 1-2 tablets by mouth 2 (two) times daily as needed for indigestion or heartburn.    [provider]  CARTIA  XT 180 MG 24 hr capsule TAKE ONE CAPSULE BY MOUTH IN THE EVENING 12/28/22   Fenton, Clint R, PA  cetirizine (ZYRTEC) 10 MG tablet Take 10 mg by mouth daily as needed for allergies.    [provider]  cholecalciferol (VITAMIN D3) 25 MCG (1000 UNIT) tablet Take 1,000 Units by mouth daily.    [provider]  diltiazem  (CARDIZEM ) 30 MG tablet Take 1 tablet every 4 hours AS NEEDED for AFIB heart rate >100 as long as top BP >100. 06/30/23   Fenton, Clint R, PA  ELIQUIS  5  MG TABS tablet TAKE ONE TABLET BY MOUTH TWICE DAILY 04/11/23   Camnitz, Soyla Lunger, MD  guaiFENesin (MUCINEX) 600 MG 12 hr tablet Take 600 mg by mouth 2 (two) times daily as needed for cough or to loosen phlegm.    [provider]  LAGEVRIO 200 MG CAPS capsule Take 4 capsules by mouth 2 (two) times daily. 08/29/23   [provider]  LORazepam (ATIVAN) 1 MG tablet Take 1 mg by mouth at bedtime as needed.    [provider]  Lutein-Zeaxanthin 25-5 MG CAPS  03/30/23   [provider]  metoprolol  succinate (TOPROL -XL) 50 MG 24 hr tablet Take 0.5 tablets (25 mg total) by mouth at bedtime. 08/01/23   Fenton, Clint R, PA  Multiple Vitamins-Minerals (MULTIVITAMIN ADULTS 50+ PO) Take 1 tablet by mouth daily.    [provider]  Omega-3 Fatty Acids (FISH OIL ) 1200 MG CAPS Take 1,200 mg by mouth daily.    [provider]  polyethylene glycol (MIRALAX  / GLYCOLAX ) 17 g packet Take 17 g by mouth daily.    [provider]  rosuvastatin  (CRESTOR ) 20 MG tablet Take 1 tablet (20 mg total) by mouth daily. 07/08/23   Patwardhan, Newman PARAS, MD  simethicone  (MYLICON) 80 MG chewable tablet Chew 80-160 mg by mouth every 6 (six) hours as needed for flatulence.    [provider]  sodium chloride  (OCEAN) 0.65 % SOLN nasal spray Place 1 spray into both nostrils as needed for congestion.    [provider]  tadalafil (CIALIS) 20 MG tablet Take 20 mg by mouth daily as needed for erectile dysfunction. 10/04/22   [provider]  traZODone (DESYREL) 50 MG tablet Take 50 mg by mouth at bedtime. 02/04/23   [provider]  valACYclovir (VALTREX) 1000 MG tablet Take 2,000 mg by mouth every 12 (twelve) hours as needed (cold sore).  12/04/12   [provider]    Allergies: Penicillins    Review of Systems  Constitutional:  Negative for fever.  Respiratory:  Negative for shortness of breath.   Cardiovascular:  Positive for chest pain.   Gastrointestinal:  Positive for abdominal pain. Negative for nausea and vomiting.  All other systems reviewed and are negative.   Updated Vital Signs BP 109/60   Pulse (!) 47   Temp 98.7 F (37.1 C)   Resp 18   SpO2 98%   Physical Exam Vitals and nursing note reviewed.  Constitutional:      Appearance: He is well-developed. He is not ill-appearing.  HENT:     Head: Normocephalic and atraumatic.  Eyes:     Pupils: Pupils are equal, round, and reactive to light.  Cardiovascular:     Rate and Rhythm: Normal rate and regular rhythm.     Heart sounds: Normal heart sounds. No murmur heard. Pulmonary:     Effort: Pulmonary effort is normal. No respiratory distress.     Breath sounds: Normal breath sounds. No wheezing.  Abdominal:     General: Bowel sounds are normal.     Palpations: Abdomen is soft.     Tenderness: There is abdominal tenderness. There is no rebound.     Comments: Slight epigastric tenderness palpation, no rebound or guarding  Musculoskeletal:     Cervical back: Neck supple.  Lymphadenopathy:     Cervical: No cervical adenopathy.  Skin:    General: Skin is warm and dry.  Neurological:     Mental Status: He is alert and oriented to person, place, and time.  Psychiatric:        Mood and Affect: Mood normal.     (all labs ordered are listed, but only abnormal results are displayed) Labs Reviewed  BASIC METABOLIC PANEL WITH GFR - Abnormal; Notable for the following components:      Result Value   Sodium 132 (*)    All other components within normal limits  CBC  HEPATIC FUNCTION PANEL  LIPASE, BLOOD  TROPONIN T, HIGH SENSITIVITY  TROPONIN T, HIGH SENSITIVITY    EKG: EKG Interpretation Date/Time:  Friday September 16 2023 02:20:13 EDT Ventricular Rate:  48 PR Interval:    QRS Duration:  92 QT Interval:  418 QTC Calculation: 373 R Axis:   64  Text Interpretation: Normal sinus rhythm When compared with ECG of 13-Sep-2023 12:15, No significant change  was found Confirmed by Bari Pfeiffer ([id number]) on 09/16/2023 4:51:17 AM  Radiology: [facility] Chest Port 1 View Result Date: 09/16/2023 CLINICAL DATA:  Chest pain EXAM: PORTABLE CHEST 1 VIEW COMPARISON:  02/24/2023 FINDINGS: The heart size and mediastinal contours are within normal limits. Both lungs are clear. The visualized skeletal structures are unremarkable. IMPRESSION: No active disease. Electronically Signed   By: Franky Crease M.D.   On: 09/16/2023 02:52     Procedures   Medications Ordered in the ED - No data to display  Medical Decision Making Amount and/or Complexity of Data Reviewed Labs: ordered. Radiology: ordered.  Risk Prescription drug management.   This patient presents to the ED for concern of chest pain, abdominal pain, this involves an extensive number of treatment options, and is a complaint that carries with it a high risk of complications and morbidity.  I considered the following differential and admission for this acute, potentially life threatening condition.  The differential diagnosis includes ACS, PE, pneumothorax, pneumonia, gastritis, reflux, pancreatitis, cholecystitis  MDM:    This is a 73 year old male who presents with sharp chest and abdominal pain.  It has slowly resolved.  He is nontoxic and vital signs reassuring.  EKG is sinus rhythm without acute ischemic or arrhythmic changes.  History and physical exam most suggestive of GI etiology.  Troponin x 2 negative.  Lipase and LFTs are normal.  No leukocytosis.  Given slow resolution of symptoms, question whether Pepcid  may have actually been effective but just slow acting.  Would recommend a daily PPI and follow-up with primary doctor and cardiologist.  Patient has remained asymptomatic in the emergency department.  Low suspicion for cardiac etiology.  (Labs, imaging, consults)  Labs: I Ordered, and personally interpreted labs.  The pertinent results include: CBC, CMP,  lipase, troponin x 2  Imaging Studies ordered: I ordered imaging studies including x-ray without pneumothorax or pneumonia I independently visualized and interpreted imaging. I agree with the radiologist interpretation  Additional history obtained from chart review.  External records from outside source obtained and reviewed including prior evaluations  Cardiac Monitoring: The patient was maintained on a cardiac monitor.  If on the cardiac monitor, I personally viewed and interpreted the cardiac monitored which showed an underlying rhythm of: Sinus  Reevaluation: After the interventions noted above, I reevaluated the patient and found that they have :resolved  Social Determinants of Health:  lives independently  Disposition: Discharge  Co morbidities that complicate the patient evaluation  Past Medical History:  Diagnosis Date   Arthritis    Atrial flutter (HCC) 2007   s/p CTI ablation by Dr Waddell   Back pain    resolved per pt 12/06/18   Dysrhythmia    afib   Fibromyalgia    GERD (gastroesophageal reflux disease)    Hyperlipidemia    Hypertension    Insomnia    occasional per pt 12/06/18   NICM (nonischemic cardiomyopathy) (HCC)    a. 2005 - tachy mediated. normalized after return of NSR.   Obesity    Paroxysmal atrial fibrillation (HCC)    Pneumonia    x 1   Spinal stenosis      Medicines Meds ordered this encounter  Medications   omeprazole  (PRILOSEC) 20 MG capsule    Sig: Take 1 capsule (20 mg total) by mouth daily.    Dispense:  30 capsule    Refill:  0    I have reviewed the patients home medicines and have made adjustments as needed  Problem List / ED Course: Problem List Items Addressed This Visit   None Visit Diagnoses       Epigastric pain    -  Primary     Atypical chest pain                    Final diagnoses:  Epigastric pain  Atypical chest pain    ED Discharge Orders          Ordered    omeprazole  (PRILOSEC) 20 MG  capsule  Daily        09/16/23 0510               Bari Charmaine FALCON, MD 09/16/23 [phone]

## 2023-09-16 NOTE — Discharge Instructions (Signed)
 You were seen today for chest and abdominal pain.  Your workup is reassuring.  Start omeprazole  daily for possible gastro esophageal reflux.  Follow-up closely also with your cardiologist.

## 2023-09-21 ENCOUNTER — Other Ambulatory Visit (HOSPITAL_COMMUNITY): Payer: Self-pay | Admitting: Physician Assistant

## 2023-09-21 DIAGNOSIS — I480 Paroxysmal atrial fibrillation: Secondary | ICD-10-CM

## 2023-09-25 DIAGNOSIS — E782 Mixed hyperlipidemia: Secondary | ICD-10-CM | POA: Diagnosis not present

## 2023-09-25 DIAGNOSIS — I119 Hypertensive heart disease without heart failure: Secondary | ICD-10-CM | POA: Diagnosis not present

## 2023-09-25 DIAGNOSIS — I4891 Unspecified atrial fibrillation: Secondary | ICD-10-CM | POA: Diagnosis not present

## 2023-10-04 ENCOUNTER — Other Ambulatory Visit: Payer: Self-pay | Admitting: Cardiology

## 2023-10-04 DIAGNOSIS — I4819 Other persistent atrial fibrillation: Secondary | ICD-10-CM

## 2023-10-04 NOTE — Telephone Encounter (Signed)
 Prescription refill request for Eliquis  received. Indication: a fib Last office visit: 10/14/23 Scr: 0.79 epic 822/25 Age: 73 Weight: 84kg

## 2023-10-05 DIAGNOSIS — M419 Scoliosis, unspecified: Secondary | ICD-10-CM | POA: Diagnosis not present

## 2023-10-05 DIAGNOSIS — E782 Mixed hyperlipidemia: Secondary | ICD-10-CM | POA: Diagnosis not present

## 2023-10-05 DIAGNOSIS — R7301 Impaired fasting glucose: Secondary | ICD-10-CM | POA: Diagnosis not present

## 2023-10-05 DIAGNOSIS — I119 Hypertensive heart disease without heart failure: Secondary | ICD-10-CM | POA: Diagnosis not present

## 2023-10-05 DIAGNOSIS — I4891 Unspecified atrial fibrillation: Secondary | ICD-10-CM | POA: Diagnosis not present

## 2023-10-05 DIAGNOSIS — G4700 Insomnia, unspecified: Secondary | ICD-10-CM | POA: Diagnosis not present

## 2023-10-05 DIAGNOSIS — L299 Pruritus, unspecified: Secondary | ICD-10-CM | POA: Diagnosis not present

## 2023-10-05 LAB — LAB REPORT - SCANNED: EGFR: 98

## 2023-10-11 DIAGNOSIS — H26491 Other secondary cataract, right eye: Secondary | ICD-10-CM | POA: Diagnosis not present

## 2023-10-11 DIAGNOSIS — H353131 Nonexudative age-related macular degeneration, bilateral, early dry stage: Secondary | ICD-10-CM | POA: Diagnosis not present

## 2023-10-11 DIAGNOSIS — I4891 Unspecified atrial fibrillation: Secondary | ICD-10-CM | POA: Diagnosis not present

## 2023-10-11 DIAGNOSIS — I119 Hypertensive heart disease without heart failure: Secondary | ICD-10-CM | POA: Diagnosis not present

## 2023-10-25 DIAGNOSIS — I119 Hypertensive heart disease without heart failure: Secondary | ICD-10-CM | POA: Diagnosis not present

## 2023-10-25 DIAGNOSIS — E782 Mixed hyperlipidemia: Secondary | ICD-10-CM | POA: Diagnosis not present

## 2023-10-25 DIAGNOSIS — I4891 Unspecified atrial fibrillation: Secondary | ICD-10-CM | POA: Diagnosis not present

## 2023-10-27 DIAGNOSIS — H26491 Other secondary cataract, right eye: Secondary | ICD-10-CM | POA: Diagnosis not present

## 2023-11-09 ENCOUNTER — Encounter (HOSPITAL_COMMUNITY): Payer: Self-pay

## 2023-11-09 ENCOUNTER — Ambulatory Visit (HOSPITAL_COMMUNITY)
Admission: RE | Admit: 2023-11-09 | Discharge: 2023-11-09 | Disposition: A | Discharge status: Home / Self Care | Source: Ambulatory Visit | Attending: Cardiovascular Disease | Admitting: Cardiovascular Disease

## 2023-11-09 ENCOUNTER — Telehealth (HOSPITAL_COMMUNITY): Payer: Self-pay

## 2023-11-09 DIAGNOSIS — I4819 Other persistent atrial fibrillation: Secondary | ICD-10-CM | POA: Insufficient documentation

## 2023-11-09 DIAGNOSIS — Z01812 Encounter for preprocedural laboratory examination: Secondary | ICD-10-CM | POA: Diagnosis not present

## 2023-11-09 DIAGNOSIS — I484 Atypical atrial flutter: Secondary | ICD-10-CM | POA: Diagnosis not present

## 2023-11-09 DIAGNOSIS — I517 Cardiomegaly: Secondary | ICD-10-CM | POA: Diagnosis not present

## 2023-11-09 DIAGNOSIS — I700 Atherosclerosis of aorta: Secondary | ICD-10-CM | POA: Diagnosis not present

## 2023-11-09 MED ORDER — IOHEXOL 350 MG/ML SOLN
100.0000 mL | Freq: Once | INTRAVENOUS | Status: AC | PRN
  Administered 2023-11-09: 100 mL via INTRAVENOUS

## 2023-11-09 NOTE — Telephone Encounter (Signed)
 Attempted to reach patient to discuss upcoming procedure, no answer. Left VM for patient to return call.

## 2023-11-09 NOTE — Telephone Encounter (Signed)
 Spoke with patient to complete pre-procedure call.     Health status review:  Any new medical conditions, recent signs of acute illness or been started on antibiotics? No Any recent hospitalizations or surgeries? No Any new medications started since pre-op visit? No  Follow all medication instructions prior to procedure or the procedure may be rescheduled:    Continue taking Eliquis  (Apixaban ) twice daily without missing any doses before procedure. Essential chronic medications:  No medication should be continued, unless told otherwise. On the morning of your procedure DO NOT take any medication., including Eliquis  (Apixaban ).  Nothing to eat or drink after midnight prior to your procedure.  Pre-procedure testing scheduled: CT and lab work on October 15.  Confirmed patient is scheduled for Atrial Fibrillation Ablation on Wednesday, November 5 with Dr. Dr. Inocencio. Instructed patient to arrive at the Main Entrance A at [facility]: [address] [place], [place] [address] and check in at Admitting at 6:30 AM.  Advised of plan to go home the same day and will only stay overnight if medically necessary. You MUST have a responsible adult to drive you home and MUST be with you the first 24 hours after you arrive home or your procedure could be cancelled.  Informed patient a nurse will call a day before the procedure to confirm arrival time and ensure instructions are followed.  Patient verbalized understanding to information provided and is agreeable to proceed with procedure.   Advised patient to contact RN Navigator at [phone], to inform of any new medications started after call or concerns prior to procedure.

## 2023-11-10 DIAGNOSIS — I119 Hypertensive heart disease without heart failure: Secondary | ICD-10-CM | POA: Diagnosis not present

## 2023-11-10 DIAGNOSIS — I4891 Unspecified atrial fibrillation: Secondary | ICD-10-CM | POA: Diagnosis not present

## 2023-11-10 LAB — CBC
Hematocrit: 42.6 % (ref 37.5–51.0)
Hemoglobin: 13.7 g/dL (ref 13.0–17.7)
MCH: 33.1 pg — ABNORMAL HIGH (ref 26.6–33.0)
MCHC: 32.2 g/dL (ref 31.5–35.7)
MCV: 103 fL — ABNORMAL HIGH (ref 79–97)
Platelets: 156 x10E3/uL (ref 150–450)
RBC: 4.14 x10E6/uL (ref 4.14–5.80)
RDW: 13.1 % (ref 11.6–15.4)
WBC: 4 x10E3/uL (ref 3.4–10.8)

## 2023-11-10 LAB — BASIC METABOLIC PANEL WITH GFR
BUN/Creatinine Ratio: 17 (ref 10–24)
BUN: 12 mg/dL (ref 8–27)
CO2: 26 mmol/L (ref 20–29)
Calcium: 9.1 mg/dL (ref 8.6–10.2)
Chloride: 100 mmol/L (ref 96–106)
Creatinine, Ser: 0.7 mg/dL — ABNORMAL LOW (ref 0.76–1.27)
Glucose: 70 mg/dL (ref 70–99)
Potassium: 4.4 mmol/L (ref 3.5–5.2)
Sodium: 136 mmol/L (ref 134–144)
eGFR: 97 mL/min/1.73 (ref >59)

## 2023-11-14 DIAGNOSIS — N5201 Erectile dysfunction due to arterial insufficiency: Secondary | ICD-10-CM | POA: Diagnosis not present

## 2023-11-14 DIAGNOSIS — N486 Induration penis plastica: Secondary | ICD-10-CM | POA: Diagnosis not present

## 2023-11-14 DIAGNOSIS — R399 Unspecified symptoms and signs involving the genitourinary system: Secondary | ICD-10-CM | POA: Diagnosis not present

## 2023-11-14 NOTE — Telephone Encounter (Signed)
 Attempted to reach patient back regarding VM left, inquiring about receiving flu vaccination prior to ablation. Left detailed message advising there is no contraindication to receiving the flu vaccination prior to procedure.

## 2023-11-25 DIAGNOSIS — I119 Hypertensive heart disease without heart failure: Secondary | ICD-10-CM | POA: Diagnosis not present

## 2023-11-25 DIAGNOSIS — E782 Mixed hyperlipidemia: Secondary | ICD-10-CM | POA: Diagnosis not present

## 2023-11-25 DIAGNOSIS — I4891 Unspecified atrial fibrillation: Secondary | ICD-10-CM | POA: Diagnosis not present

## 2023-11-29 NOTE — Pre-Procedure Instructions (Signed)
 Attempted to call patient regarding procedure instructions for tomorrow.  Left voicemail on the following items: Arrival time 0615 Nothing to eat or drink after midnight No meds AM of procedure Responsible person to drive you home and stay with you for 24 hrs  Have you missed any doses of anti-coagulant Eliquis - should be taken twice a day, if you have missed any doses please let us  know.  Don't take dose morning of procedure.

## 2023-11-30 ENCOUNTER — Ambulatory Visit (HOSPITAL_COMMUNITY)
Admission: RE | Disposition: A | Discharge status: Home / Self Care | Payer: Self-pay | Source: Home / Self Care | Attending: Cardiology

## 2023-11-30 ENCOUNTER — Ambulatory Visit (HOSPITAL_COMMUNITY): Admitting: Anesthesiology

## 2023-11-30 ENCOUNTER — Other Ambulatory Visit: Payer: Self-pay

## 2023-11-30 ENCOUNTER — Ambulatory Visit (HOSPITAL_COMMUNITY)
Admission: RE | Admit: 2023-11-30 | Discharge: 2023-11-30 | Disposition: A | Discharge status: Home / Self Care | Attending: Cardiology | Admitting: Cardiology

## 2023-11-30 DIAGNOSIS — E785 Hyperlipidemia, unspecified: Secondary | ICD-10-CM | POA: Diagnosis not present

## 2023-11-30 DIAGNOSIS — I110 Hypertensive heart disease with heart failure: Secondary | ICD-10-CM | POA: Insufficient documentation

## 2023-11-30 DIAGNOSIS — Z87891 Personal history of nicotine dependence: Secondary | ICD-10-CM | POA: Insufficient documentation

## 2023-11-30 DIAGNOSIS — K219 Gastro-esophageal reflux disease without esophagitis: Secondary | ICD-10-CM | POA: Insufficient documentation

## 2023-11-30 DIAGNOSIS — I2510 Atherosclerotic heart disease of native coronary artery without angina pectoris: Secondary | ICD-10-CM

## 2023-11-30 DIAGNOSIS — I5022 Chronic systolic (congestive) heart failure: Secondary | ICD-10-CM | POA: Insufficient documentation

## 2023-11-30 DIAGNOSIS — I4892 Unspecified atrial flutter: Secondary | ICD-10-CM | POA: Diagnosis not present

## 2023-11-30 DIAGNOSIS — I4819 Other persistent atrial fibrillation: Secondary | ICD-10-CM

## 2023-11-30 DIAGNOSIS — I4891 Unspecified atrial fibrillation: Secondary | ICD-10-CM | POA: Diagnosis not present

## 2023-11-30 HISTORY — PX: A-FLUTTER ABLATION: EP1230

## 2023-11-30 HISTORY — PX: ATRIAL FIBRILLATION ABLATION: EP1191

## 2023-11-30 LAB — POCT ACTIVATED CLOTTING TIME
Activated Clotting Time: 285 s
Activated Clotting Time: 325 s
Activated Clotting Time: 348 s

## 2023-11-30 MED ORDER — ALBUMIN HUMAN 5 % IV SOLN
INTRAVENOUS | Status: AC
Start: 2023-11-30 — End: 2023-11-30
  Filled 2023-11-30: qty 250

## 2023-11-30 MED ORDER — HEPARIN SODIUM (PORCINE) 1000 UNIT/ML IJ SOLN
INTRAMUSCULAR | Status: DC | PRN
  Administered 2023-11-30: 14000 [IU] via INTRAVENOUS
  Administered 2023-11-30: 3000 [IU] via INTRAVENOUS
  Administered 2023-11-30: 5000 [IU] via INTRAVENOUS
  Administered 2023-11-30: 6000 [IU] via INTRAVENOUS

## 2023-11-30 MED ORDER — HEPARIN (PORCINE) IN NACL 1000-0.9 UT/500ML-% IV SOLN
INTRAVENOUS | Status: DC | PRN
  Administered 2023-11-30 (×2): 500 mL

## 2023-11-30 MED ORDER — HEPARIN SODIUM (PORCINE) 1000 UNIT/ML IJ SOLN
INTRAMUSCULAR | Status: DC | PRN
  Administered 2023-11-30: 1000 [IU] via INTRAVENOUS

## 2023-11-30 MED ORDER — PROTAMINE SULFATE 10 MG/ML IV SOLN
INTRAVENOUS | Status: DC | PRN
  Administered 2023-11-30: 40 mg via INTRAVENOUS

## 2023-11-30 MED ORDER — SODIUM CHLORIDE 0.9 % IV SOLN: INTRAVENOUS | Status: DC

## 2023-11-30 MED ORDER — FENTANYL CITRATE (PF) 250 MCG/5ML IJ SOLN
INTRAMUSCULAR | Status: DC | PRN
  Administered 2023-11-30 (×2): 50 ug via INTRAVENOUS

## 2023-11-30 MED ORDER — SODIUM CHLORIDE 0.9% FLUSH: 3.0000 mL | Freq: Two times a day (BID) | INTRAVENOUS | Status: DC

## 2023-11-30 MED ORDER — FENTANYL CITRATE (PF) 100 MCG/2ML IJ SOLN
INTRAMUSCULAR | Status: AC
  Filled 2023-11-30: qty 2

## 2023-11-30 MED ORDER — ALBUMIN HUMAN 5 % IV SOLN
12.5000 g | Freq: Once | INTRAVENOUS | Status: AC
  Administered 2023-11-30: 12.5 g via INTRAVENOUS

## 2023-11-30 MED ORDER — LIDOCAINE 2% (20 MG/ML) 5 ML SYRINGE
INTRAMUSCULAR | Status: DC | PRN
  Administered 2023-11-30: 100 mg via INTRAVENOUS

## 2023-11-30 MED ORDER — PHENYLEPHRINE HCL-NACL 20-0.9 MG/250ML-% IV SOLN
INTRAVENOUS | Status: DC | PRN
Start: 2023-11-30 — End: 2023-11-30
  Administered 2023-11-30: 30 ug/min via INTRAVENOUS

## 2023-11-30 MED ORDER — ONDANSETRON HCL 4 MG/2ML IJ SOLN
INTRAMUSCULAR | Status: DC | PRN
Start: 2023-11-30 — End: 2023-11-30
  Administered 2023-11-30: 4 mg via INTRAVENOUS

## 2023-11-30 MED ORDER — SUGAMMADEX SODIUM 200 MG/2ML IV SOLN
INTRAVENOUS | Status: DC | PRN
  Administered 2023-11-30: 200 mg via INTRAVENOUS

## 2023-11-30 MED ORDER — ONDANSETRON HCL 4 MG/2ML IJ SOLN: 4.0000 mg | Freq: Four times a day (QID) | INTRAMUSCULAR | Status: DC | PRN

## 2023-11-30 MED ORDER — ACETAMINOPHEN 325 MG PO TABS: 650.0000 mg | ORAL_TABLET | ORAL | Status: DC | PRN

## 2023-11-30 MED ORDER — ROCURONIUM BROMIDE 10 MG/ML (PF) SYRINGE
PREFILLED_SYRINGE | INTRAVENOUS | Status: DC | PRN
  Administered 2023-11-30: 50 mg via INTRAVENOUS

## 2023-11-30 MED ORDER — PROPOFOL 10 MG/ML IV BOLUS
INTRAVENOUS | Status: DC | PRN
  Administered 2023-11-30: 130 mg via INTRAVENOUS

## 2023-11-30 MED ORDER — SODIUM CHLORIDE 0.9 % IV SOLN: 250.0000 mL | INTRAVENOUS | Status: DC | PRN

## 2023-11-30 MED ORDER — HEPARIN SODIUM (PORCINE) 1000 UNIT/ML IJ SOLN
INTRAMUSCULAR | Status: AC
  Filled 2023-11-30: qty 10

## 2023-11-30 MED ORDER — DEXAMETHASONE SOD PHOSPHATE PF 10 MG/ML IJ SOLN
INTRAMUSCULAR | Status: DC | PRN
  Administered 2023-11-30: 8 mg via INTRAVENOUS

## 2023-11-30 MED ORDER — SODIUM CHLORIDE 0.9% FLUSH: 3.0000 mL | INTRAVENOUS | Status: DC | PRN

## 2023-11-30 NOTE — Progress Notes (Signed)
 Discharge instructions reviewed with patient and wife Clarinda at the bedside, denies questions or concerns. PT ambulated in the hallway was able to void without difficulty. Tolerate PO fluids no complaints of n/v. PT escorted fromt eh unit via wheel chair to personal vehicle.

## 2023-11-30 NOTE — Anesthesia Procedure Notes (Signed)
 Procedure Name: Intubation Date/Time: 11/30/2023 8:48 AM  Performed by: Delores Duwaine SAUNDERS, CRNAPre-anesthesia Checklist: Patient identified, Emergency Drugs available, Suction available and Patient being monitored Patient Re-evaluated:Patient Re-evaluated prior to induction Oxygen Delivery Method: Circle System Utilized Preoxygenation: Pre-oxygenation with 100% oxygen Induction Type: IV induction Ventilation: Mask ventilation without difficulty Laryngoscope Size: Mac and 4 Grade View: Grade I Tube type: Oral Number of attempts: 1 Airway Equipment and Method: Stylet and Oral airway Placement Confirmation: ETT inserted through vocal cords under direct vision, positive ETCO2 and breath sounds checked- equal and bilateral Secured at: 21 cm Tube secured with: Tape Dental Injury: Teeth and Oropharynx as per pre-operative assessment

## 2023-11-30 NOTE — Discharge Instructions (Signed)
 Cardiac Ablation, Care After  This sheet gives you information about how to care for yourself after your procedure. Your health care provider may also give you more specific instructions. If you have problems or questions, contact your health care provider. What can I expect after the procedure? After the procedure, it is common to have: Bruising around your puncture site. Tenderness around your puncture site. Skipped heartbeats. If you had an atrial fibrillation ablation, you may have atrial fibrillation during the first several months after your procedure.  Tiredness (fatigue).  Follow these instructions at home: Puncture site care  Follow instructions from your health care provider about how to take care of your puncture site. Make sure you: If present, leave stitches (sutures), skin glue, or adhesive strips in place. These skin closures may need to stay in place for up to 2 weeks. If adhesive strip edges start to loosen and curl up, you may trim the loose edges. Do not remove adhesive strips completely unless your health care provider tells you to do that. If a large square bandage is present, this may be removed 24 hours after surgery.  Check your puncture site every day for signs of infection. Check for: Redness, swelling, or pain. Fluid or blood. If your puncture site starts to bleed, lie down on your back, apply firm pressure to the area, and contact your health care provider. Warmth. Pus or a bad smell. A pea or marble sized lump/knot at the site is normal and can take up to three months to resolve.  Driving Do not drive for at least 4 days after your procedure or however long your health care provider recommends. (Do not resume driving if you have previously been instructed not to drive for other health reasons.) Do not drive or use heavy machinery while taking prescription pain medicine. Activity Avoid activities that take a lot of effort for at least 7 days after your  procedure. Do not lift anything that is heavier than 5 lb (4.5 kg) for one week.  No sexual activity for 1 week.  Return to your normal activities as told by your health care provider. Ask your health care provider what activities are safe for you. General instructions Take over-the-counter and prescription medicines only as told by your health care provider. Do not use any products that contain nicotine or tobacco, such as cigarettes and e-cigarettes. If you need help quitting, ask your health care provider. You may shower after 24 hours, but Do not take baths, swim, or use a hot tub for 1 week.  Do not drink alcohol for 24 hours after your procedure. Keep all follow-up visits as told by your health care provider. This is important. Contact a health care provider if: You have redness, mild swelling, or pain around your puncture site. You have fluid or blood coming from your puncture site that stops after applying firm pressure to the area. Your puncture site feels warm to the touch. You have pus or a bad smell coming from your puncture site. You have a fever. You have chest pain or discomfort that spreads to your neck, jaw, or arm. You have chest pain that is worse with lying on your back or taking a deep breath. You are sweating a lot. You feel nauseous. You have a fast or irregular heartbeat. You have shortness of breath. You are dizzy or light-headed and feel the need to lie down. You have pain or numbness in the arm or leg closest to your puncture site.  Get help right away if: Your puncture site suddenly swells. Your puncture site is bleeding and the bleeding does not stop after applying firm pressure to the area. These symptoms may represent a serious problem that is an emergency. Do not wait to see if the symptoms will go away. Get medical help right away. Call your local emergency services (911 in the [place] Do not drive yourself to the hospital. Summary After the procedure, it  is normal to have bruising and tenderness at the puncture site in your groin, neck, or forearm. Check your puncture site every day for signs of infection. Get help right away if your puncture site is bleeding and the bleeding does not stop after applying firm pressure to the area. This is a medical emergency. This information is not intended to replace advice given to you by your health care provider. Make sure you discuss any questions you have with your health care provider.  Femoral Site Care This sheet gives you information about how to care for yourself after your procedure. Your health care provider may also give you more specific instructions. If you have problems or questions, contact your health care provider. What can I expect after the procedure?  After the procedure, it is common to have: Bruising that usually fades within 1-2 weeks. Tenderness at the site. Follow these instructions at home: Wound care Follow instructions from your health care provider about how to take care of your insertion site. Make sure you: Wash your hands with soap and water before you change your bandage (dressing). If soap and water are not available, use hand sanitizer. Remove your dressing as told by your health care provider. In 24 hours Do not take baths, swim, or use a hot tub until your health care provider approves. You may shower 24-48 hours after the procedure or as told by your health care provider. Gently wash the site with plain soap and water. Pat the area dry with a clean towel. Do not rub the site. This may cause bleeding. Do not apply powder or lotion to the site. Keep the site clean and dry. Check your femoral site every day for signs of infection. Check for: Redness, swelling, or pain. Fluid or blood. Warmth. Pus or a bad smell. Activity For the first 2-3 days after your procedure, or as long as directed: Avoid climbing stairs as much as possible. Do not squat. Do not lift  anything that is heavier than 10 lb (4.5 kg), or the limit that you are told, until your health care provider says that it is safe. For 5 days Rest as directed. Avoid sitting for a long time without moving. Get up to take short walks every 1-2 hours. Do not drive for 24 hours if you were given a medicine to help you relax (sedative). General instructions Take over-the-counter and prescription medicines only as told by your health care provider. Keep all follow-up visits as told by your health care provider. This is important. Contact a health care provider if you have: A fever or chills. You have redness, swelling, or pain around your insertion site. Get help right away if: The catheter insertion area swells very fast. You pass out. You suddenly start to sweat or your skin gets clammy. The catheter insertion area is bleeding, and the bleeding does not stop when you hold steady pressure on the area. The area near or just beyond the catheter insertion site becomes pale, cool, tingly, or numb. These symptoms may represent a serious  problem that is an emergency. Do not wait to see if the symptoms will go away. Get medical help right away. Call your local emergency services (911 in the [place] Do not drive yourself to the hospital. Summary After the procedure, it is common to have bruising that usually fades within 1-2 weeks. Check your femoral site every day for signs of infection. Do not lift anything that is heavier than 10 lb (4.5 kg), or the limit that you are told, until your health care provider says that it is safe. This information is not intended to replace advice given to you by your health care provider. Make sure you discuss any questions you have with your health care provider. Document Revised: 01/24/2017 Document Reviewed: 01/24/2017 Elsevier Patient Education  2020 [employer].

## 2023-11-30 NOTE — Anesthesia Preprocedure Evaluation (Signed)
 Anesthesia Evaluation  Patient identified by MRN, date of birth, ID band Patient awake    Reviewed: Allergy & Precautions, NPO status , Patient's Chart, lab work & pertinent test results  History of Anesthesia Complications Negative for: history of anesthetic complications  Airway Mallampati: III  TM Distance: >3 FB Neck ROM: Full    Dental no notable dental hx.    Pulmonary former smoker   Pulmonary exam normal breath sounds clear to auscultation       Cardiovascular hypertension, Pt. on home beta blockers (-) angina + CAD and +CHF  Normal cardiovascular exam+ dysrhythmias Atrial Fibrillation  Rhythm:Regular Rate:Normal  IMPRESSIONS     1. 3D EF does not appear to be accurate. Left ventricular ejection  fraction, by estimation, is 55 to 60%. The left ventricle has normal  function. The left ventricle has no regional wall motion abnormalities.  Left ventricular diastolic parameters were  normal.   2. Right ventricular systolic function is normal. The right ventricular  size is normal. There is normal pulmonary artery systolic pressure.   3. Left atrial size was mildly dilated.   4. Right atrial size was mildly dilated.   5. The mitral valve is abnormal. Mild mitral valve regurgitation. No  evidence of mitral stenosis.   6. The aortic valve is tricuspid. There is moderate calcification of the  aortic valve. There is moderate thickening of the aortic valve. Aortic  valve regurgitation is not visualized. Aortic valve sclerosis is present,  with no evidence of aortic valve  stenosis.   7. The inferior vena cava is normal in size with greater than 50%  respiratory variability, suggesting right atrial pressure of 3 mmHg.     Neuro/Psych neg Seizures negative neurological ROS  negative psych ROS   GI/Hepatic Neg liver ROS,GERD  ,,  Endo/Other  negative endocrine ROS    Renal/GU negative Renal ROS      Musculoskeletal  (+) Arthritis ,  Fibromyalgia -  Abdominal   Peds  Hematology   Anesthesia Other Findings   Reproductive/Obstetrics                              Anesthesia Physical Anesthesia Plan  ASA: 3  Anesthesia Plan: General   Post-op Pain Management:    Induction: Intravenous  PONV Risk Score and Plan: 2 and Ondansetron , Dexamethasone  and Treatment may vary due to age or medical condition  Airway Management Planned: Oral ETT  Additional Equipment:   Intra-op Plan:   Post-operative Plan: Extubation in OR  Informed Consent: I have reviewed the patients History and Physical, chart, labs and discussed the procedure including the risks, benefits and alternatives for the proposed anesthesia with the patient or authorized representative who has indicated his/her understanding and acceptance.     Dental advisory given  Plan Discussed with: CRNA  Anesthesia Plan Comments:         Anesthesia Quick Evaluation

## 2023-11-30 NOTE — Transfer of Care (Signed)
 Immediate Anesthesia Transfer of Care Note  Patient: Gary [name]  Procedure(s) Performed: ATRIAL FIBRILLATION ABLATION A-FLUTTER ABLATION  Patient Location: PACU  Anesthesia Type:General  Level of Consciousness: awake and alert   Airway & Oxygen Therapy: Patient Spontanous Breathing  Post-op Assessment: Report given to RN  Post vital signs: Reviewed and stable  Last Vitals:  Vitals Value Taken Time  BP 84/54 11/30/23 10:40  Temp    Pulse 48 11/30/23 10:44  Resp 23 11/30/23 10:44  SpO2 97 % 11/30/23 10:44  Vitals shown include unfiled device data.  Last Pain:  Vitals:   11/30/23 0733  TempSrc: Oral  PainSc: 0-No pain         Complications: There were no known notable events for this encounter.

## 2023-11-30 NOTE — H&P (Signed)
  Electrophysiology Office Note:   Date:  11/30/2023  ID:  Gary [name], DOB December 16, 1950, MRN [id number]  Primary Cardiologist: Newman JINNY Lawrence, MD Primary Heart Failure: None Electrophysiologist: Viren Lebeau Gladis Norton, MD      History of Present Illness:   Gary [name] is a 73 y.o. male with h/o chronic systolic heart failure, hypertension, hyperlipidemia, atrial fibrillation/flutter seen today for routine electrophysiology followup.   He is post atrial fibrillation ablation in 2020.  He is on dofetilide .  He presented to atrial fibrillation clinic in atrial flutter.  He feels significant palpitations and fatigue when he is in atrial flutter.  In addition, his heart rates have been fast at times when he is exerting himself.  He would prefer rhythm control.  Today, denies symptoms of palpitations, chest pain, dyspnea, orthopnea, PND, lower extremity edema, claudication, dizziness, presyncope, syncope, bleeding, or neurologic sequela. The patient is tolerating medications without difficulties. Plan ablation today.   EP Information / Studies Reviewed:    EKG is ordered today. Personal review as below.      Risk Assessment/Calculations:    CHA2DS2-VASc Score = 3   This indicates a 3.2% annual risk of stroke. The patient's score is based upon: CHF History: 0 HTN History: 1 Diabetes History: 0 Stroke History: 0 Vascular Disease History: 1 Age Score: 1 Gender Score: 0            Physical Exam:   VS:  There were no vitals taken for this visit.   Wt Readings from Last 3 Encounters:  09/13/23 84.4 kg  07/19/23 87.1 kg  06/01/23 87.9 kg    GEN: Well nourished, well developed in no acute distress NECK: No JVD; No carotid bruits CARDIAC: Regular rate and rhythm, no murmurs, rubs, gallops RESPIRATORY:  Clear to auscultation without rales, wheezing or rhonchi  ABDOMEN: Soft, non-tender, non-distended EXTREMITIES:  No edema; No deformity    ASSESSMENT AND PLAN:    1.   Persistent atrial fibrillation/flutter: Gary [name] has presented today for surgery, with the diagnosis of AF.  The various methods of treatment have been discussed with the patient and family. After consideration of risks, benefits and other options for treatment, the patient has consented to  Procedure(s): Catheter ablation as a surgical intervention .  Risks include but not limited to complete heart block, stroke, esophageal damage, nerve damage, bleeding, vascular damage, tamponade, perforation, MI, and death. The patient's history has been reviewed, patient examined, no change in status, stable for surgery.  I have reviewed the patient's chart and labs.  Questions were answered to the patient's satisfaction.    Gary Hunkele Norton, MD 11/30/2023 7:00 AM

## 2023-11-30 NOTE — Progress Notes (Signed)
 Patient fully awake upon arrival to cath lab holding bay 19. Follows commands and answers questions appropriately. No neuro deficits noted. Bilateral groin access sites are dry/intact with no bleeding or hematoma noted. Post activity and precautions explained. Patient verbalized understanding. EKG obtained per order. Will continue to monitor per post op orders and protocol.Charmelle Soh E

## 2023-11-30 NOTE — Progress Notes (Signed)
 Systolic blood pressure maintaining 80s. Patient is alert/oriented. Denies any distress. Albumin  administered per orders via Dr. Erma. Will continue to monitor per orders/protocol.Gary [name] E

## 2023-12-01 ENCOUNTER — Encounter (HOSPITAL_COMMUNITY): Payer: Self-pay | Admitting: Cardiology

## 2023-12-01 ENCOUNTER — Telehealth (HOSPITAL_COMMUNITY): Payer: Self-pay

## 2023-12-01 MED FILL — Fentanyl Citrate Preservative Free (PF) Inj 100 MCG/2ML: INTRAMUSCULAR | Qty: 2 | Status: AC

## 2023-12-01 NOTE — Anesthesia Postprocedure Evaluation (Signed)
 Anesthesia Post Note  Patient: Gary [name]  Procedure(s) Performed: ATRIAL FIBRILLATION ABLATION A-FLUTTER ABLATION     Patient location during evaluation: PACU Anesthesia Type: General Level of consciousness: awake and alert Pain management: pain level controlled Vital Signs Assessment: post-procedure vital signs reviewed and stable Respiratory status: spontaneous breathing, nonlabored ventilation, respiratory function stable and patient connected to nasal cannula oxygen Cardiovascular status: blood pressure returned to baseline and stable Postop Assessment: no apparent nausea or vomiting Anesthetic complications: no   There were no known notable events for this encounter.  Last Vitals:  Vitals:   11/30/23 1330 11/30/23 1400  BP: 129/63 111/61  Pulse: 94 95  Resp: 16 13  Temp:    SpO2: 99% 98%    Last Pain:  Vitals:   11/30/23 1030  TempSrc: Oral  PainSc: 0-No pain                 Thom JONELLE Peoples

## 2023-12-01 NOTE — Telephone Encounter (Signed)
 Attempted to reach patient to follow up with procedure completed on 11/30/23, no answer. Left VM for patient to return call.

## 2023-12-10 DIAGNOSIS — I119 Hypertensive heart disease without heart failure: Secondary | ICD-10-CM | POA: Diagnosis not present

## 2023-12-10 DIAGNOSIS — I4891 Unspecified atrial fibrillation: Secondary | ICD-10-CM | POA: Diagnosis not present

## 2023-12-12 DIAGNOSIS — X32XXXD Exposure to sunlight, subsequent encounter: Secondary | ICD-10-CM | POA: Diagnosis not present

## 2023-12-12 DIAGNOSIS — L820 Inflamed seborrheic keratosis: Secondary | ICD-10-CM | POA: Diagnosis not present

## 2023-12-12 DIAGNOSIS — L821 Other seborrheic keratosis: Secondary | ICD-10-CM | POA: Diagnosis not present

## 2023-12-12 DIAGNOSIS — D1801 Hemangioma of skin and subcutaneous tissue: Secondary | ICD-10-CM | POA: Diagnosis not present

## 2023-12-12 DIAGNOSIS — L308 Other specified dermatitis: Secondary | ICD-10-CM | POA: Diagnosis not present

## 2023-12-12 DIAGNOSIS — L570 Actinic keratosis: Secondary | ICD-10-CM | POA: Diagnosis not present

## 2023-12-12 DIAGNOSIS — D225 Melanocytic nevi of trunk: Secondary | ICD-10-CM | POA: Diagnosis not present

## 2023-12-25 DIAGNOSIS — I4891 Unspecified atrial fibrillation: Secondary | ICD-10-CM | POA: Diagnosis not present

## 2023-12-25 DIAGNOSIS — I119 Hypertensive heart disease without heart failure: Secondary | ICD-10-CM | POA: Diagnosis not present

## 2023-12-25 DIAGNOSIS — E782 Mixed hyperlipidemia: Secondary | ICD-10-CM | POA: Diagnosis not present

## 2023-12-28 ENCOUNTER — Other Ambulatory Visit (HOSPITAL_COMMUNITY): Payer: Self-pay

## 2023-12-28 ENCOUNTER — Telehealth (HOSPITAL_COMMUNITY): Payer: Self-pay

## 2023-12-28 ENCOUNTER — Ambulatory Visit (HOSPITAL_COMMUNITY)
Admission: RE | Admit: 2023-12-28 | Discharge: 2023-12-28 | Disposition: A | Source: Ambulatory Visit | Attending: Physician Assistant | Admitting: Physician Assistant

## 2023-12-28 VITALS — BP 126/52 | HR 70 | Ht 71.0 in | Wt 185.8 lb

## 2023-12-28 DIAGNOSIS — I484 Atypical atrial flutter: Secondary | ICD-10-CM | POA: Diagnosis not present

## 2023-12-28 DIAGNOSIS — I4819 Other persistent atrial fibrillation: Secondary | ICD-10-CM | POA: Diagnosis not present

## 2023-12-28 DIAGNOSIS — D6869 Other thrombophilia: Secondary | ICD-10-CM | POA: Diagnosis not present

## 2023-12-28 DIAGNOSIS — I4891 Unspecified atrial fibrillation: Secondary | ICD-10-CM

## 2023-12-28 MED ORDER — DRONEDARONE HCL 400 MG PO TABS
400.0000 mg | ORAL_TABLET | Freq: Two times a day (BID) | ORAL | 4 refills | Status: AC
  Filled 2023-12-29: qty 60, 30d supply, fill #0
  Filled 2024-01-23: qty 60, 30d supply, fill #1
  Filled 2024-02-22: qty 60, 30d supply, fill #2

## 2023-12-28 NOTE — Telephone Encounter (Signed)
 Pharmacy Patient Advocate Encounter  Insurance verification completed.    The patient is insured through [employer]/ [employer]. Patient has Medicare and is not eligible for a copay card, but may be able to apply for patient assistance or Medicare RX Payment Plan (Patient Must reach out to their plan, if eligible for payment plan), if available.    Ran test claim for Dofetilide  500mcg capsule and the current 30 day co-pay is $0.   This test claim was processed through [facility]- copay amounts may vary at other pharmacies due to [employer], or as the patient moves through the different stages of their insurance plan.

## 2023-12-28 NOTE — Progress Notes (Signed)
 Primary Care Physician: Loreli Kins, MD Primary Cardiologist: Dr Dann  Primary Electrophysiologist: Dr Inocencio  Referring Physician: Dr Kelsie Fairy Gary [name] is a 73 y.o. male with a history of NICM, HTN, HLD, atrial flutter, and atrial fibrillation who presents for follow up in the [facility]. Patient is on Eliquis  for stroke prevention. He was maintained on dofetilide  for his afib and is s/p afib ablation 02/2018. He has had brief (10-15 minute) episodes of afib since ablation but on 04/23/20 he noted a persistently elevated heart rate. He took an extra dose of his BB but this did not slow his heart rate. He was set up for DCCV but spontaneously converted to SR. Patient is s/p repeat ablation with Dr Inocencio on 02/04/22. Dofetilide  was discontinued 01/18/23. He wore a cardiac monitor which showed 7% afib burden. He was seen by Dr Inocencio and had repeat ablation on 11/30/23.  Patient returns for follow up for atrial fibrillation and atrial flutter. Patient did have some afib right after ablation but then it resolved. However, over the past weekend, he has had much more atrial flutter associated with mild lightheadedness and an uncomfortable sensation in his chest. There were no specific triggers that he could identify.   Today, he  denies symptoms of chest pain, shortness of breath, orthopnea, PND, lower extremity edema, dizziness, presyncope, syncope, snoring, daytime somnolence, bleeding, or neurologic sequela. The patient is tolerating medications without difficulties and is otherwise without complaint today.    Atrial Fibrillation Risk Factors:  he does not have symptoms or diagnosis of sleep apnea.. he does not have a history of rheumatic fever.   Atrial Fibrillation Management history:  Previous antiarrhythmic drugs: flecainide , dofetilide  Previous cardioversions: 2018, 2019, 2020 x 4 Previous ablations: 02/2018, 02/04/22, 11/30/23 Anticoagulation  history: Eliquis    Past Medical History:  Diagnosis Date   Arthritis    Atrial flutter (HCC) 2007   s/p CTI ablation by Dr Waddell   Back pain    resolved per pt 12/06/18   Dysrhythmia    afib   Fibromyalgia    GERD (gastroesophageal reflux disease)    Hyperlipidemia    Hypertension    Insomnia    occasional per pt 12/06/18   NICM (nonischemic cardiomyopathy) (HCC)    a. 2005 - tachy mediated. normalized after return of NSR.   Obesity    Paroxysmal atrial fibrillation (HCC)    Pneumonia    x 1   Spinal stenosis     Current Outpatient Medications  Medication Sig Dispense Refill   acetaminophen  (TYLENOL ) 500 MG tablet Take 50-1,000 mg by mouth every 6 (six) hours as needed (pain). (Patient taking differently: Take 500-1,000 mg by mouth every other day.)     ascorbic acid (VITAMIN C ) 500 MG tablet Take 500 mg by mouth daily. (Patient taking differently: Take 500 mg by mouth every other day.)     Baclofen 5 MG TABS Take 5 mg by mouth 3 (three) times daily as needed (Hiccups).     calcium  carbonate (TUMS - DOSED IN MG ELEMENTAL CALCIUM ) 500 MG chewable tablet Chew 1-2 tablets by mouth 2 (two) times daily as needed for indigestion or heartburn.     cetirizine (ZYRTEC) 10 MG tablet Take 10 mg by mouth daily as needed for allergies. (Patient taking differently: Take 10 mg by mouth as needed for allergies.)     cyclobenzaprine  (FLEXERIL ) 10 MG tablet Take 5-10 mg by mouth 3 (three) times daily as  needed for muscle spasms.     diltiazem  (CARDIZEM ) 30 MG tablet Take 1 tablet every 4 hours AS NEEDED for AFIB heart rate >100 as long as top BP >100. 45 tablet 1   diltiazem  (CARTIA  XT) 180 MG 24 hr capsule TAKE ONE CAPSULE BY MOUTH IN THE EVENING 90 capsule 3   ELIQUIS  5 MG TABS tablet TAKE ONE TABLET BY MOUTH TWICE DAILY 60 tablet 5   LORazepam (ATIVAN) 1 MG tablet Take 1 mg by mouth at bedtime as needed. (Patient taking differently: Take 1.5 mg by mouth at bedtime.)     Lutein-Zeaxanthin  25-5 MG CAPS Take 1 capsule by mouth daily.     metoprolol  succinate (TOPROL -XL) 50 MG 24 hr tablet Take 0.5 tablets (25 mg total) by mouth at bedtime. (Patient taking differently: Take 12.5 mg by mouth at bedtime.)     Multiple Vitamins-Minerals (MULTIVITAMIN ADULTS 50+ PO) Take 1 tablet by mouth daily. 50+     Omega-3 Fatty Acids (FISH OIL ) 1200 MG CAPS Take 1,200 mg by mouth daily.     polyethylene glycol (MIRALAX  / GLYCOLAX ) 17 g packet Take 17 g by mouth daily as needed for mild constipation or moderate constipation.     rosuvastatin  (CRESTOR ) 20 MG tablet Take 1 tablet (20 mg total) by mouth daily. 90 tablet 3   simethicone  (MYLICON) 80 MG chewable tablet Chew 80-160 mg by mouth every 6 (six) hours as needed for flatulence. (Patient taking differently: Chew 80-160 mg by mouth as needed for flatulence.)     sodium chloride  (OCEAN) 0.65 % SOLN nasal spray Place 1 spray into both nostrils daily as needed for congestion.     tadalafil (CIALIS) 20 MG tablet Take 20 mg by mouth daily as needed for erectile dysfunction. (Patient taking differently: Take 5 mg by mouth every morning.)     traZODone (DESYREL) 50 MG tablet Take 50 mg by mouth at bedtime.     valACYclovir (VALTREX) 1000 MG tablet Take 2,000 mg by mouth every 12 (twelve) hours as needed (cold sore).  (Patient taking differently: Take 2,000 mg by mouth as needed (cold sore).)     guaiFENesin (MUCINEX) 600 MG 12 hr tablet Take 600 mg by mouth 2 (two) times daily as needed for cough or to loosen phlegm.     Current Facility-Administered Medications  Medication Dose Route Frequency Provider Last Rate Last Admin   0.9 %  sodium chloride  infusion   Intravenous PRN Loreli Kins, MD       triamcinolone  acetonide (KENALOG ) 10 MG/ML injection 10 mg  10 mg Other Once Regal, Norman S, DPM        ROS- All systems are reviewed and negative except as per the HPI above.  Physical Exam: Vitals:   12/28/23 1029  Pulse: 70  Weight: 84.3 kg   Height: 5' 11 (1.803 m)    GEN: Well nourished, well developed in no acute distress CARDIAC: Irregularly irregular rate and rhythm, no murmurs, rubs, gallops RESPIRATORY:  Clear to auscultation without rales, wheezing or rhonchi  ABDOMEN: Soft, non-tender, non-distended EXTREMITIES:  No edema; No deformity    Wt Readings from Last 3 Encounters:  12/28/23 84.3 kg  11/30/23 81.6 kg  09/13/23 84.4 kg    EKG Interpretation Date/Time:  Wednesday December 28 2023 10:40:17 EST Ventricular Rate:  70 PR Interval:  234 QRS Duration:  88 QT Interval:  348 QTC Calculation: 375 R Axis:   58  Text Interpretation: Atrial flutter converting to SR with  Premature atrial complexes Otherwise normal ECG Confirmed by Elizabethann Lackey ([id number]) on 12/28/2023 10:46:16 AM    Echo 07/06/23 demonstrated   1. 3D EF does not appear to be accurate. Left ventricular ejection  fraction, by estimation, is 55 to 60%. The left ventricle has normal  function. The left ventricle has no regional wall motion abnormalities.  Left ventricular diastolic parameters were normal.   2. Right ventricular systolic function is normal. The right ventricular  size is normal. There is normal pulmonary artery systolic pressure.   3. Left atrial size was mildly dilated.   4. Right atrial size was mildly dilated.   5. The mitral valve is abnormal. Mild mitral valve regurgitation. No  evidence of mitral stenosis.   6. The aortic valve is tricuspid. There is moderate calcification of the  aortic valve. There is moderate thickening of the aortic valve. Aortic  valve regurgitation is not visualized. Aortic valve sclerosis is present,  with no evidence of aortic valve  stenosis.   7. The inferior vena cava is normal in size with greater than 50%  respiratory variability, suggesting right atrial pressure of 3 mmHg.   Epic records are reviewed at length today   CHA2DS2-VASc Score = 3  The patient's score is based upon: CHF History:  0 HTN History: 1 Diabetes History: 0 Stroke History: 0 Vascular Disease History: 1 Age Score: 1 Gender Score: 0       ASSESSMENT AND PLAN: Persistent Atrial Fibrillation/atrial flutter (ICD10:  I48.19) The patient's CHA2DS2-VASc score is 3, indicating a 3.2% annual risk of stroke.   S/p afib ablation 02/2018, 02/04/22, and 11/30/23. Was previously on dofetilide , stopped post ablation. Patient appears to be going in and out of atrial flutter frequently. Converted to SR today in office. We discussed rhythm control options today including Multaq and amiodarone. He does not want to resume dofetilide . If price is OK, will start Multaq 400 mg BID and recheck ECG 5-7 days. If cost prohibitive, will start amiodarone 200 mg BID with ECG in 2 weeks. Hopefully, AAD will be short term while he heals from ablation.  Continue Eliquis  5 mg BID with no missed doses for 3 months post ablation.  Continue diltiazem  180 mg daily with 30 mg PRN q 4 hours for heart racing. Continue Toprol  12.5 mg daily Kardia mobile for home monitoring.   Secondary Hypercoagulable State (ICD10:  D68.69) The patient is at significant risk for stroke/thromboembolism based upon his CHA2DS2-VASc Score of 3.  Continue Apixaban  (Eliquis ). No bleeding issues.   HTN Stable on current regimen  CAD No anginal symptoms Followed by Dr Elmira   Follow up in the AF clinic for ECG after starting AAD.     Daril Kicks PA-C [facility] [address] [place], [place] [address] [phone] 12/28/2023 10:46 AM

## 2023-12-28 NOTE — Addendum Note (Signed)
 Encounter addended by: Franchot Glade RAMAN, RN on: 12/28/2023 12:02 PM  Actions taken: Order list changed

## 2023-12-29 ENCOUNTER — Other Ambulatory Visit (HOSPITAL_COMMUNITY): Payer: Self-pay

## 2023-12-29 ENCOUNTER — Ambulatory Visit: Attending: Cardiology | Admitting: Cardiology

## 2023-12-29 ENCOUNTER — Encounter: Payer: Self-pay | Admitting: Cardiology

## 2023-12-29 VITALS — BP 100/50 | HR 83 | Ht 71.0 in | Wt 189.0 lb

## 2023-12-29 DIAGNOSIS — I480 Paroxysmal atrial fibrillation: Secondary | ICD-10-CM | POA: Diagnosis not present

## 2023-12-29 DIAGNOSIS — I2510 Atherosclerotic heart disease of native coronary artery without angina pectoris: Secondary | ICD-10-CM | POA: Insufficient documentation

## 2023-12-29 DIAGNOSIS — E782 Mixed hyperlipidemia: Secondary | ICD-10-CM | POA: Diagnosis not present

## 2023-12-29 NOTE — Patient Instructions (Signed)

## 2023-12-29 NOTE — Progress Notes (Signed)
 Cardiology Office Note:  .   Date:  12/29/2023  ID:  Gary [name], DOB 03-17-1950, MRN [id number] PCP: Loreli Kins, MD  [facility] Providers Cardiologist:  Newman Lawrence, MD PCP: Loreli Kins, MD  Chief Complaint  Patient presents with   Paroxysmal A-fib     Gary [name] is a 73 y.o. male with hypertension, hyperlipidemia, nonischemic cardiomyopathy w/recovered LVEF, elevated coronary calcium  score, paroxysmal atrial A-fib/flutter s/p ablation X2, PSVT, NSVT  Patient underwent A-fib ablation in 2020, and again more recently on 11/30/2023 with Dr. Inocencio.  Unfortunately, he had recurrence of A-fib soon thereafter.  He was recently seen in A-fib clinic and was found to have paroxysmal A-fib converting to sinus rhythm, captured on EKG.  He has noticed episodes of lightheadedness and palpitations associated with A-fib that are lasting longer now than compared to before the ablation.  He is slightly frustrated with this situation.  He was prescribed Multaq  in A-fib clinic, which she has not started yet.  He has follow-up in A-fib clinic next week.  Separately, patient also noticed that his calcium  score on pre-A-fib ablation CT scan was noted to be higher now compared to his previous calcium  score scans.  On a separate note, blood pressure is low normal today, he denies any active complaints of lightheadedness.  Vitals:   12/29/23 0928  BP: (!) 100/50  Pulse: 83  SpO2: 98%      Review of Systems  Cardiovascular:  Positive for palpitations (Improved). Negative for chest pain, dyspnea on exertion, leg swelling and syncope.  Neurological:  Positive for light-headedness.        Studies Reviewed: SABRA        EKG 04/11/2023: Sinus rhythm 55 bpm First-degree block  Labs 03/2023: Chol 100, TG 69, HDL 39, LDL 47 HbA1C 5.4% Hb 14.6  Echocardiogram 06/2023: 1. 3D EF does not appear to be accurate. Left ventricular ejection  fraction, by estimation, is 55 to  60%. The left ventricle has normal  function. The left ventricle has no regional wall motion abnormalities.  Left ventricular diastolic parameters were  normal.   2. Right ventricular systolic function is normal. The right ventricular  size is normal. There is normal pulmonary artery systolic pressure.   3. Left atrial size was mildly dilated.   4. Right atrial size was mildly dilated.   5. The mitral valve is abnormal. Mild mitral valve regurgitation. No  evidence of mitral stenosis.   6. The aortic valve is tricuspid. There is moderate calcification of the  aortic valve. There is moderate thickening of the aortic valve. Aortic  valve regurgitation is not visualized. Aortic valve sclerosis is present,  with no evidence of aortic valve  stenosis.   7. The inferior vena cava is normal in size with greater than 50%  respiratory variability, suggesting right atrial pressure of 3 mmHg.    Zio patch monitor 13 days 03/07/2023 - 03/21/2023: Dominant rhythm: Sinus. HR 41-133 bpm. Avg HR 58 bpm, in sinus rhythm. 27 episodes of SVT/atrial tachycardia, fastest at 141 bpm for 6 beats, longest for 23 secs at 93 bpm (No reported symptoms) 1.1% isolated SVE, <1% couplet/triplets. 2 episodes of VT, fastest and longest at 141 bpm for 15 beats. (No reported symptoms) <1% isolated VE, couplets. No atrial fibrillation/atrial flutter/high grade AV block, sinus pause >3sec noted. 3 patient triggered events correlated with SVE, VE.   Stress test 03/2023: No ischemia.  EF 55 to 65%.  CT cardiac scoring 01/2018:: Calcium :  730, 77th percentile  Risk Assessment/Calculations:    CHA2DS2-VASc Score = 3   This indicates a 3.2% annual risk of stroke. The patient's score is based upon: CHF History: 0 HTN History: 1 Diabetes History: 0 Stroke History: 0 Vascular Disease History: 1 Age Score: 1 Gender Score: 0     Physical Exam Vitals and nursing note reviewed.  Constitutional:      General: He is  not in acute distress. Neck:     Vascular: No JVD.  Cardiovascular:     Rate and Rhythm: Normal rate. Rhythm irregular.     Heart sounds: Normal heart sounds. No murmur heard. Pulmonary:     Effort: Pulmonary effort is normal.     Breath sounds: Normal breath sounds. No wheezing or rales.  Musculoskeletal:     Right lower leg: No edema.     Left lower leg: No edema.      VISIT DIAGNOSES:   ICD-10-CM   1. PAF (paroxysmal atrial fibrillation) (HCC)  I48.0     2. Coronary artery calcification  I25.10     3. Mixed hyperlipidemia  E78.2         Gary [name] is a 73 y.o. male with hypertension, hyperlipidemia, nonischemic cardiomyopathy w/recovered LVEF, elevated coronary calcium  score, paroxysmal atrial A-fib/flutter s/p ablation, PSVT, NSVT Assessment & Plan  Paroxysmal A-fib: Recent A-fib ablation on 11/30/2023, now with continued episodes of paroxysmal A-fib, symptomatic. Agree with starting Multaq 400 mg twice daily. Given that he has salvos of A-fib, I do not think cardioversion would help. Keep follow-up with A-fib clinic next week. I will also forward my note to Dr. Inocencio for any additional recommendations. Continue Eliquis  5 mg twice daily.    Elevated coronary calcium  score: Notably, calcium  score is higher now compared to 2020.  In patients on statin therapy, there is no prognostic value in tracking calcium  score numbers as it may go up with plaque stabilization while on statin.  This does not necessarily indicate higher level of obstructive coronary artery disease.  Continue current medications.  I discussed with the patient about increasing Crestor  dose to further reduce his LDL, but he wants to hold off given his ongoing A-fib issues.  I think this is reasonable.  Will address again at next visit.     I spent 30 minutes in the care of Gary [name] today including reviewing records of ablation, reviewing serial lipid panel levels and calcium  score scan,  discussing management options for A-fib, coordinating care with A-fib clinic and Dr. Inocencio, and documenting in the encounter.   F/u in 6 months  Signed, Newman JINNY Lawrence, MD

## 2024-01-05 ENCOUNTER — Ambulatory Visit (HOSPITAL_COMMUNITY)
Admission: RE | Admit: 2024-01-05 | Discharge: 2024-01-05 | Attending: Physician Assistant | Admitting: Physician Assistant

## 2024-01-05 VITALS — HR 56

## 2024-01-05 DIAGNOSIS — I4891 Unspecified atrial fibrillation: Secondary | ICD-10-CM

## 2024-01-05 NOTE — Progress Notes (Signed)
 Patient returns for ECG after starting Multaq . ECG shows:  EKG Interpretation Date/Time:  Thursday January 05 2024 10:42:33 EST Ventricular Rate:  56 PR Interval:  222 QRS Duration:  84 QT Interval:  402 QTC Calculation: 387 R Axis:   62  Text Interpretation: Sinus bradycardia When compared with ECG of 28-Dec-2023 10:40, Sinus bradycardia has replaced Atrial flutter Confirmed by Rogelio Winbush ([id number]) on 01/05/2024 11:08:42 AM    Follow up as scheduled in AF clinic.

## 2024-02-22 ENCOUNTER — Other Ambulatory Visit: Payer: Self-pay

## 2024-02-22 ENCOUNTER — Other Ambulatory Visit (HOSPITAL_COMMUNITY): Payer: Self-pay

## 2024-02-23 ENCOUNTER — Other Ambulatory Visit (HOSPITAL_COMMUNITY): Payer: Self-pay

## 2024-03-01 ENCOUNTER — Ambulatory Visit (HOSPITAL_COMMUNITY)
Admission: RE | Admit: 2024-03-01 | Discharge: 2024-03-01 | Disposition: A | Source: Ambulatory Visit | Attending: Physician Assistant | Admitting: Physician Assistant

## 2024-03-01 VITALS — BP 96/54 | HR 76 | Ht 71.0 in | Wt 180.4 lb

## 2024-03-01 DIAGNOSIS — Z79899 Other long term (current) drug therapy: Secondary | ICD-10-CM

## 2024-03-01 DIAGNOSIS — I4891 Unspecified atrial fibrillation: Secondary | ICD-10-CM

## 2024-03-01 DIAGNOSIS — D6869 Other thrombophilia: Secondary | ICD-10-CM

## 2024-03-01 DIAGNOSIS — I484 Atypical atrial flutter: Secondary | ICD-10-CM

## 2024-03-01 DIAGNOSIS — Z5181 Encounter for therapeutic drug level monitoring: Secondary | ICD-10-CM

## 2024-03-01 NOTE — Progress Notes (Signed)
 "   Primary Care Physician: Loreli Kins, MD Primary Cardiologist: Dr Dann  Primary Electrophysiologist: Dr Inocencio  Referring Physician: Dr Kelsie Fairy JONELLE Gary [name] is a 74 y.o. male with a history of NICM, HTN, HLD, atrial flutter, and atrial fibrillation who presents for follow up in the [facility]. Patient is on Eliquis  for stroke prevention. He was maintained on dofetilide  for his afib and is s/p afib ablation 02/2018. He has had brief (10-15 minute) episodes of afib since ablation but on 04/23/20 he noted a persistently elevated heart rate. He took an extra dose of his BB but this did not slow his heart rate. He was set up for DCCV but spontaneously converted to SR. Patient is s/p repeat ablation with Dr Inocencio on 02/04/22. Dofetilide  was discontinued 01/18/23. He wore a cardiac monitor which showed 7% afib burden. He was seen by Dr Inocencio and had repeat ablation on 11/30/23. Patient had recurrent paroxysms of afib and was started on Multaq .   Patient returns for follow up for atrial fibrillation and Multaq  monitoring. Discussed the use of AI scribe software for clinical note transcription with the patient, who gave verbal consent to proceed.  He has been using [employer]ia Mobile to monitor his atrial fibrillation and atrial flutter. Occasionally, he discovers he is in atrial fibrillation when checking his blood pressure at night, but the arrhythmia often resolves by the next morning. His blood pressure readings vary, with occasional low readings over the past month, but they are typically around 120/65. He does not experience significant symptoms during his arrhythmia episodes.  He recently experienced the passing of an elderly relative, which has been a busy and emotionally taxing time, but he does not believe it has significantly affected his arrhythmia or blood pressure readings.      Today, he  denies symptoms of palpitations, chest pain, shortness of breath,  orthopnea, PND, lower extremity edema, dizziness, presyncope, syncope, snoring, daytime somnolence, bleeding, or neurologic sequela. The patient is tolerating medications without difficulties and is otherwise without complaint today.    Atrial Fibrillation Risk Factors:  he does not have symptoms or diagnosis of sleep apnea.. he does not have a history of rheumatic fever.   Atrial Fibrillation Management history:  Previous antiarrhythmic drugs: flecainide , dofetilide , Multaq  Previous cardioversions: 2018, 2019, 2020 x 4 Previous ablations: 02/2018, 02/04/22, 11/30/23 Anticoagulation history: Eliquis    Past Medical History:  Diagnosis Date   Arthritis    Atrial flutter (HCC) 2007   s/p CTI ablation by Dr Waddell   Back pain    resolved per pt 12/06/18   Dysrhythmia    afib   Fibromyalgia    GERD (gastroesophageal reflux disease)    Hyperlipidemia    Hypertension    Insomnia    occasional per pt 12/06/18   NICM (nonischemic cardiomyopathy) (HCC)    a. 2005 - tachy mediated. normalized after return of NSR.   Obesity    Paroxysmal atrial fibrillation (HCC)    Pneumonia    x 1   Spinal stenosis     Current Outpatient Medications  Medication Sig Dispense Refill   acetaminophen  (TYLENOL ) 500 MG tablet Take 50-1,000 mg by mouth every 6 (six) hours as needed (pain). (Patient taking differently: Take 500-1,000 mg by mouth as needed.)     ascorbic acid (VITAMIN C ) 500 MG tablet Take 500 mg by mouth daily. (Patient taking differently: Take 500 mg by mouth every other day.)     Baclofen 5 MG  TABS Take 5 mg by mouth 3 (three) times daily as needed (Hiccups).     calcium  carbonate (TUMS - DOSED IN MG ELEMENTAL CALCIUM ) 500 MG chewable tablet Chew 1-2 tablets by mouth 2 (two) times daily as needed for indigestion or heartburn. (Patient taking differently: Chew 1-2 tablets by mouth as needed for indigestion or heartburn.)     cetirizine (ZYRTEC) 10 MG tablet Take 10 mg by mouth daily as  needed for allergies. (Patient taking differently: Take 10 mg by mouth as needed for allergies.)     cyclobenzaprine  (FLEXERIL ) 10 MG tablet Take 5-10 mg by mouth 3 (three) times daily as needed for muscle spasms. (Patient taking differently: Take 5-10 mg by mouth as needed for muscle spasms.)     diltiazem  (CARDIZEM ) 30 MG tablet Take 1 tablet every 4 hours AS NEEDED for AFIB heart rate >100 as long as top BP >100. 45 tablet 1   diltiazem  (CARTIA  XT) 180 MG 24 hr capsule TAKE ONE CAPSULE BY MOUTH IN THE EVENING 90 capsule 3   dronedarone  (MULTAQ ) 400 MG tablet Take 1 tablet (400 mg total) by mouth 2 (two) times daily with a meal. 60 tablet 4   ELIQUIS  5 MG TABS tablet TAKE ONE TABLET BY MOUTH TWICE DAILY 60 tablet 5   guaiFENesin (MUCINEX) 600 MG 12 hr tablet Take 600 mg by mouth 2 (two) times daily as needed for cough or to loosen phlegm.     LORazepam (ATIVAN) 1 MG tablet Take 1 mg by mouth at bedtime as needed. (Patient taking differently: Take 1.5 mg by mouth at bedtime.)     Lutein-Zeaxanthin 25-5 MG CAPS Take 1 capsule by mouth daily.     metoprolol  succinate (TOPROL  XL) 25 MG 24 hr tablet Take 12.5 mg by mouth at bedtime.     Multiple Vitamins-Minerals (MULTIVITAMIN ADULTS 50+ PO) Take 1 tablet by mouth daily. 50+     Omega-3 Fatty Acids (FISH OIL ) 1200 MG CAPS Take 1,200 mg by mouth daily.     polyethylene glycol (MIRALAX  / GLYCOLAX ) 17 g packet Take 17 g by mouth daily as needed for mild constipation or moderate constipation. (Patient taking differently: Take 17 g by mouth as needed for mild constipation or moderate constipation.)     rosuvastatin  (CRESTOR ) 20 MG tablet Take 1 tablet (20 mg total) by mouth daily. 90 tablet 3   simethicone  (MYLICON) 80 MG chewable tablet Chew 80-160 mg by mouth every 6 (six) hours as needed for flatulence. (Patient taking differently: Chew 80-160 mg by mouth as needed for flatulence.)     sodium chloride  (OCEAN) 0.65 % SOLN nasal spray Place 1 spray into both  nostrils daily as needed for congestion. (Patient taking differently: Place 1 spray into both nostrils as needed for congestion.)     tadalafil (CIALIS) 20 MG tablet Take 20 mg by mouth daily as needed for erectile dysfunction. (Patient taking differently: Take 5 mg by mouth at bedtime.)     traZODone (DESYREL) 50 MG tablet Take 50 mg by mouth at bedtime.     valACYclovir (VALTREX) 1000 MG tablet Take 2,000 mg by mouth every 12 (twelve) hours as needed (cold sore).  (Patient taking differently: Take 2,000 mg by mouth as needed (cold sore).)     Current Facility-Administered Medications  Medication Dose Route Frequency Provider Last Rate Last Admin   0.9 %  sodium chloride  infusion   Intravenous PRN Loreli Kins, MD       triamcinolone  acetonide (KENALOG ) 10  MG/ML injection 10 mg  10 mg Other Once Regal, Norman S, DPM        ROS- All systems are reviewed and negative except as per the HPI above.  Physical Exam: Vitals:   03/01/24 1054  BP: (!) 96/54  Pulse: 76  Weight: 81.8 kg  Height: 5' 11 (1.803 m)    GEN: Well nourished, well developed in no acute distress CARDIAC: Irregularly irregular rate and rhythm, no murmurs, rubs, gallops RESPIRATORY:  Clear to auscultation without rales, wheezing or rhonchi  ABDOMEN: Soft, non-tender, non-distended EXTREMITIES:  No edema; No deformity    Wt Readings from Last 3 Encounters:  03/01/24 81.8 kg  12/29/23 85.7 kg  12/28/23 84.3 kg    EKG Interpretation Date/Time:  Thursday March 01 2024 11:05:19 EST Ventricular Rate:  76 PR Interval:    QRS Duration:  88 QT Interval:  358 QTC Calculation: 402 R Axis:   33  Text Interpretation: Atrial flutter Abnormal ECG When compared with ECG of 05-Jan-2024 10:42, Atrial flutter has replaced Sinus rhythm Confirmed by Thang Flett ([id number]) on 03/01/2024 11:13:07 AM    Echo 07/06/23 demonstrated   1. 3D EF does not appear to be accurate. Left ventricular ejection  fraction, by estimation, is 55  to 60%. The left ventricle has normal  function. The left ventricle has no regional wall motion abnormalities.  Left ventricular diastolic parameters were normal.   2. Right ventricular systolic function is normal. The right ventricular  size is normal. There is normal pulmonary artery systolic pressure.   3. Left atrial size was mildly dilated.   4. Right atrial size was mildly dilated.   5. The mitral valve is abnormal. Mild mitral valve regurgitation. No  evidence of mitral stenosis.   6. The aortic valve is tricuspid. There is moderate calcification of the  aortic valve. There is moderate thickening of the aortic valve. Aortic  valve regurgitation is not visualized. Aortic valve sclerosis is present,  with no evidence of aortic valve  stenosis.   7. The inferior vena cava is normal in size with greater than 50%  respiratory variability, suggesting right atrial pressure of 3 mmHg.   Epic records are reviewed at length today   CHA2DS2-VASc Score = 3  The patient's score is based upon: CHF History: 0 HTN History: 1 Diabetes History: 0 Stroke History: 0 Vascular Disease History: 1 Age Score: 1 Gender Score: 0       ASSESSMENT AND PLAN: Persistent Atrial Fibrillation/atrial flutter (ICD10:  I48.19) The patient's CHA2DS2-VASc score is 3, indicating a 3.2% annual risk of stroke.   S/p afib ablation 02/2018, 02/07/22, and 11/30/23. Was previously on dofetilide , stopped after previous ablation, not interested in resuming.  Patient in atrial flutter today. Appears paroxysmal on Kardia mobile. He is happy with his present treatment.  Continue Multaq  400 mg BID Continue diltiazem  180 mg daily with 30 mg PRN q 4 hours for heart racing.  Continue Toprol  12.5 mg daily Continue Eliquis  5 mg BID Kardia mobile for home monitoring.   Secondary Hypercoagulable State (ICD10:  D68.69) The patient is at significant risk for stroke/thromboembolism based upon his CHA2DS2-VASc Score of 3.   Continue Apixaban  (Eliquis ). No bleeding issues.   High Risk Medication Monitoring (ICD 10: [id number]) Patient requires ongoing monitoring for anti-arrhythmic medication which has the potential to cause life threatening arrhythmias. Intervals on ECG acceptable for dronedarone  monitoring.      HTN Stable on current regimen  CAD No anginal symptoms Followed by  Dr Elmira   Follow up with Dr Inocencio in 3 months.    Daril Kicks PA-C [facility] [address] [place], [place] [address] [phone] 03/01/2024 11:32 AM "

## 2024-03-01 NOTE — Patient Instructions (Addendum)
 Follow up with Dr Inocencio in 3 months

## 2024-03-13 ENCOUNTER — Ambulatory Visit: Admitting: Cardiology
